# Patient Record
Sex: Male | Born: 1960
Health system: Southern US, Community
[De-identification: ages and names within clinical notes are randomized; demographics above are authoritative.]

## PROBLEM LIST (undated history)

## (undated) DIAGNOSIS — I10 Essential (primary) hypertension: Secondary | ICD-10-CM

## (undated) DIAGNOSIS — H35039 Hypertensive retinopathy, unspecified eye: Secondary | ICD-10-CM

## (undated) DIAGNOSIS — K5720 Diverticulitis of large intestine with perforation and abscess without bleeding: Secondary | ICD-10-CM

## (undated) DIAGNOSIS — R945 Abnormal results of liver function studies: Secondary | ICD-10-CM

## (undated) DIAGNOSIS — W19XXXA Unspecified fall, initial encounter: Secondary | ICD-10-CM

## (undated) DIAGNOSIS — I714 Abdominal aortic aneurysm, without rupture, unspecified: Secondary | ICD-10-CM

## (undated) DIAGNOSIS — K635 Polyp of colon: Secondary | ICD-10-CM

## (undated) DIAGNOSIS — D126 Benign neoplasm of colon, unspecified: Secondary | ICD-10-CM

## (undated) DIAGNOSIS — R001 Bradycardia, unspecified: Secondary | ICD-10-CM

## (undated) DIAGNOSIS — K648 Other hemorrhoids: Secondary | ICD-10-CM

## (undated) DIAGNOSIS — K219 Gastro-esophageal reflux disease without esophagitis: Secondary | ICD-10-CM

## (undated) DIAGNOSIS — I499 Cardiac arrhythmia, unspecified: Secondary | ICD-10-CM

## (undated) DIAGNOSIS — H269 Unspecified cataract: Secondary | ICD-10-CM

## (undated) DIAGNOSIS — K5792 Diverticulitis of intestine, part unspecified, without perforation or abscess without bleeding: Secondary | ICD-10-CM

## (undated) DIAGNOSIS — I4891 Unspecified atrial fibrillation: Secondary | ICD-10-CM

## (undated) DIAGNOSIS — R7989 Other specified abnormal findings of blood chemistry: Secondary | ICD-10-CM

## (undated) DIAGNOSIS — E785 Hyperlipidemia, unspecified: Secondary | ICD-10-CM

## (undated) DIAGNOSIS — E119 Type 2 diabetes mellitus without complications: Secondary | ICD-10-CM

## (undated) DIAGNOSIS — Z8489 Family history of other specified conditions: Secondary | ICD-10-CM

## (undated) DIAGNOSIS — Z973 Presence of spectacles and contact lenses: Secondary | ICD-10-CM

## (undated) DIAGNOSIS — H9319 Tinnitus, unspecified ear: Secondary | ICD-10-CM

## (undated) HISTORY — DX: Hyperlipidemia, unspecified: E78.5

## (undated) HISTORY — PX: TONSILLECTOMY: SUR1361

## (undated) HISTORY — DX: Essential (primary) hypertension: I10

## (undated) HISTORY — DX: Polyp of colon: K63.5

## (undated) HISTORY — DX: Diverticulitis of intestine, part unspecified, without perforation or abscess without bleeding: K57.92

## (undated) HISTORY — DX: Type 2 diabetes mellitus without complications: E11.9

## (undated) HISTORY — DX: Abnormal results of liver function studies: R94.5

## (undated) HISTORY — DX: Benign neoplasm of colon, unspecified: D12.6

## (undated) HISTORY — PX: OTHER SURGICAL HISTORY: SHX169

## (undated) HISTORY — DX: Unspecified atrial fibrillation: I48.91

## (undated) HISTORY — DX: Other hemorrhoids: K64.8

## (undated) HISTORY — DX: Diverticulitis of large intestine with perforation and abscess without bleeding: K57.20

## (undated) HISTORY — PX: SHOULDER SURGERY: SHX246

## (undated) HISTORY — PX: BACK SURGERY: SHX140

## (undated) HISTORY — DX: Abdominal aortic aneurysm, without rupture, unspecified: I71.40

## (undated) HISTORY — DX: Other specified abnormal findings of blood chemistry: R79.89

## (undated) HISTORY — DX: Unspecified cataract: H26.9

## (undated) HISTORY — DX: Hypertensive retinopathy, unspecified eye: H35.039

## (undated) HISTORY — DX: Bradycardia, unspecified: R00.1

---

## 2005-01-29 ENCOUNTER — Emergency Department: Payer: Self-pay | Admitting: Emergency Medicine

## 2005-03-16 ENCOUNTER — Encounter: Payer: Self-pay | Admitting: General Practice

## 2005-03-30 ENCOUNTER — Encounter: Payer: Self-pay | Admitting: General Practice

## 2005-04-30 ENCOUNTER — Encounter: Payer: Self-pay | Admitting: General Practice

## 2005-08-16 ENCOUNTER — Encounter: Admission: RE | Admit: 2005-08-16 | Discharge: 2005-08-16 | Payer: Self-pay | Admitting: Specialist

## 2007-01-02 ENCOUNTER — Ambulatory Visit (HOSPITAL_COMMUNITY): Admission: RE | Admit: 2007-01-02 | Discharge: 2007-01-03 | Payer: Self-pay | Admitting: Orthopedic Surgery

## 2007-01-02 ENCOUNTER — Encounter (INDEPENDENT_AMBULATORY_CARE_PROVIDER_SITE_OTHER): Payer: Self-pay | Admitting: Specialist

## 2007-12-26 ENCOUNTER — Ambulatory Visit (HOSPITAL_COMMUNITY): Admission: RE | Admit: 2007-12-26 | Discharge: 2007-12-27 | Payer: Self-pay | Admitting: Orthopedic Surgery

## 2008-07-30 ENCOUNTER — Ambulatory Visit: Payer: Self-pay | Admitting: Gastroenterology

## 2009-11-20 ENCOUNTER — Inpatient Hospital Stay (HOSPITAL_COMMUNITY): Admission: EM | Admit: 2009-11-20 | Discharge: 2009-11-21 | Payer: Self-pay | Admitting: Emergency Medicine

## 2009-11-20 ENCOUNTER — Ambulatory Visit: Payer: Self-pay | Admitting: Internal Medicine

## 2009-11-21 ENCOUNTER — Encounter: Payer: Self-pay | Admitting: Cardiovascular Disease

## 2009-11-24 ENCOUNTER — Ambulatory Visit: Payer: Self-pay

## 2009-11-24 ENCOUNTER — Ambulatory Visit: Payer: Self-pay | Admitting: Internal Medicine

## 2009-11-24 ENCOUNTER — Encounter (HOSPITAL_COMMUNITY): Admission: RE | Admit: 2009-11-24 | Discharge: 2010-01-28 | Payer: Self-pay | Admitting: Internal Medicine

## 2009-12-03 ENCOUNTER — Ambulatory Visit: Payer: Self-pay | Admitting: Cardiovascular Disease

## 2009-12-03 DIAGNOSIS — I4891 Unspecified atrial fibrillation: Secondary | ICD-10-CM | POA: Insufficient documentation

## 2010-06-23 ENCOUNTER — Encounter: Payer: Self-pay | Admitting: Cardiovascular Disease

## 2010-09-29 NOTE — Assessment & Plan Note (Signed)
Summary: Cardiology Nuclear Study  Nuclear Med Background Indications for Stress Test: Evaluation for Ischemia, Post Hospital  Indications Comments: 11/20/09 CP/AFIB with spontaneous conversion, (-) enzymes  History: GXT  History Comments: Hx. Stress test 90"s: Normal per patient. Hx. AFIB  Symptoms: Chest Pressure, Diaphoresis, Fatigue, Fatigue with Exertion  Symptoms Comments: Last episode of CP:now, 3-4/10; CP comes and goes.   Nuclear Pre-Procedure Cardiac Risk Factors: Family History - CAD, Lipids, Obesity Caffeine/Decaff Intake: None NPO After: 10:00 PM Lungs: Clear IV 0.9% NS with Angio Cath: 22g     IV Site: (R) AC IV Started by: Irean Hong RN Chest Size (in) 46     Height (in): 67 Weight (lb): 225 BMI: 35.37  Nuclear Med Study 1 or 2 day study:  1 day     Stress Test Type:  Stress Reading MD:  Dietrich Pates, MD     Referring MD:  Julien Nordmann, MD Resting Radionuclide:  Technetium 30m Tetrofosmin     Resting Radionuclide Dose:  11 mCi  Stress Radionuclide:  Technetium 13m Tetrofosmin     Stress Radionuclide Dose:  33 mCi   Stress Protocol Exercise Time (min):  13:30 min     Max HR:  153 bpm     Predicted Max HR:  170 bpm  Max Systolic BP: 180 mm Hg     Percent Max HR:  90 %     METS: 16.4 Rate Pressure Product:  16109    Stress Test Technologist:  Rea College CMA-N     Nuclear Technologist:  Domenic Polite CNMT  Rest Procedure  Myocardial perfusion imaging was performed at rest 45 minutes following the intravenous administration of Myoview Technetium 79m Tetrofosmin.  Stress Procedure  The patient exercised for 13:30.  The patient stopped due to fatigue.  He c/o chest pressure, 3-4/10, prior to exercise that did not increase with exercise.  There were no diagnostic ST-T wave changes, only rare PVC and rare couplet.  Myoview was injected at peak exercise and myocardial perfusion imaging was performed after a brief delay.  QPS Raw Data Images:  Soft tissue  (diaphragm) underlies heart. Stress Images:  There is normal uptake in all areas. Rest Images:  Normal homogeneous uptake in all areas of the myocardium. Subtraction (SDS):  No evidence of ischemia. Transient Ischemic Dilatation:  .88  (Normal <1.22)  Lung/Heart Ratio:  .44  (Normal <0.45)  Quantitative Gated Spect Images QGS EDV:  113 ml QGS ESV:  51 ml QGS EF:  55 %   Overall Impression  Exercise Capacity: Excellent exercise capacity. BP Response: Normal blood pressure response. Clinical Symptoms: No chest pain ECG Impression: No significant ST segment change suggestive of ischemia. Overall Impression: Normal stress nuclear study.  Appended Document: Cardiology Nuclear Study pt aware. Charlena Cross RN BSN

## 2010-09-29 NOTE — Progress Notes (Signed)
Summary: PHI  PHI   Imported By: Harlon Flor 12/11/2009 08:24:31  _____________________________________________________________________  External Attachment:    Type:   Image     Comment:   External Document

## 2010-09-29 NOTE — Assessment & Plan Note (Signed)
Summary: NP6/AMD   Visit Type:  New post hospital Referring Provider:  Ladona Ridgel Primary Provider:  Dr Burnett Sheng  CC:  some chest discomfort since out of hospital, feel like a fullness. Fatique is better, and but a little on sunday. no edema in ankles and feet. .  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  He states that on March 24, he woke up fatigued. He went to the gym, lifted weights and worked out on the elliptical. He was unable to stop sweating. He went home and took a shower and continued to sweat. His wife suggested that he go to the emergency room and he went to [facility] where he was noted to be in atrial fibrillation with rates in the 60s to 70s. He converted on his own without significant intervention.  Since then, he has felt well with no further episodes of fatigue palpitations. He reported having some chest tightness in the emergency room on an occasional basis when he worked out and he underwent a stress test.  This study was a exercise pharmacologic study. He exercised for 16 minutes with no significant EKG changes. There is no signs of ischemia on perfusion. Overall this was a low risk scan.  He is currently only taking aspirin and no rhythm controling  medication.  Preventive Screening-Counseling & Management  Alcohol-Tobacco     Alcohol drinks/day: 0     Smoking Status: never  Caffeine-Diet-Exercise     Caffeine use/day: 4     Does Patient Exercise: yes      Drug Use:  no.    Current Problems (verified): 1)  Shortness of Breath  (ICD-786.05)  Current Medications (verified): 1)  Aspirin Ec 325 Mg Tbec (Aspirin) .... Take One Tablet By Mouth Daily 2)  Simvastatin 40 Mg Tabs (Simvastatin) .... Take One Tablet By Mouth Daily At Bedtime 3)  Folic Acid 400 Mcg Tabs (Folic Acid) .... 1 By Mouth Once  Daily 4)  Fish Oil 1000 Mg Caps (Omega-3 Fatty Acids) .... 1 By Mouth Once Daily 5)  Co Q-10 150 Mg Caps (Coenzyme Q10) .... 1 By Mouth Once Daily 6)  Daily Multi  Tabs (Multiple Vitamins-Minerals) .... 1 By Mouth Once Daily  Allergies (verified): 1)  ! Percocet  Past History:  Past Medical History: hx of Atrial Fibrillation Hyperlipidemia Hypertension  Past Surgical History: 2 shoulder surgeries back surgery  Family History: Father: living 74: heart problems, DM, afib Mother: living 73: heart problems. a-fib  Social History: Full Time Married  Tobacco Use - No.  Alcohol Use - no Regular Exercise - yes Drug Use - no Alcohol drinks/day:  0 Smoking Status:  never Caffeine use/day:  4 Does Patient Exercise:  yes Drug Use:  no  Review of Systems  The patient denies fever, weight loss, weight gain, vision loss, decreased hearing, hoarseness, chest pain, syncope, dyspnea on exertion, peripheral edema, prolonged cough, abdominal pain, incontinence, muscle weakness, depression, and enlarged lymph nodes.    Vital Signs:  Patient profile:   50 year old male Height:      67 inches Weight:      228 pounds BMI:     35 .84 Pulse rate:   66 / minute Pulse rhythm:   regular BP sitting:   134 / 84  (left arm)  Vitals Entered By: Mercer Pod (December 03, 2009 2:19  PM)  Physical Exam  General:  well-appearing middle-aged male in no apparent distress, alert and oriented x3,HEENT exam is benign, oropharynx is clear, neck is supple with no JVP or carotid bruits, heart sounds are regular with S1-S2 and no murmurs appreciated, lungs are clear to auscultation with no wheezes or rales, abdominal exam is benign, no significant lower extremity edema, neurologic exam is nonfocal, skin is warm and dry. Normal pulses in her upper and lower extremities.    EKG  Procedure date:  11/21/2009  Findings:      normal sinus rhythm with rate 48 beats per minute, no significant ST or T wave  changes.  Impression & Recommendations:  Problem # 1:  ATRIAL FIBRILLATION (ICD-427.31) Episode of lone atrial fibrillation on March 25 on EKG before converting to normal sinus rhythm without intervention.  Negative stress test in followup. As his heart rate and blood pressure a reasonably well-controlled, we'll not start him on a standing vacation. He was also bradycardic in the emergency room once he converted to normal sinus rhythm with a heart rate of 48.  We will give him some Bystolic 5 mg samples that he can take on a p.r.n. basis if he feels that he is converted back to atrial fibrillation. I've asked him to contact our office if he starts having more episodes as we may have to start him on alternative medications.   If he had additional episodes of age fibrillation, we will also order an echocardiogram. He denies having any sleep disturbance consistent with obstructive sleep apnea.  TSH was 2.6  His updated medication list for this problem includes:    Aspirin Ec 325 Mg Tbec (Aspirin) .[name]... Take one tablet by mouth daily    Bystolic 5 Mg Tabs (Nebivolol hcl) .[name]... 1 tab as needed for palpitations  Patient Instructions: 1)  Your physician recommends that you schedule a follow-up appointment in: 1 year 2)  Your physician has recommended you make the following change in your medication: bystolic 5 mg as needed for palpitations

## 2010-10-07 ENCOUNTER — Ambulatory Visit (INDEPENDENT_AMBULATORY_CARE_PROVIDER_SITE_OTHER): Payer: PRIVATE HEALTH INSURANCE | Admitting: Cardiovascular Disease

## 2010-10-07 ENCOUNTER — Encounter: Payer: Self-pay | Admitting: Cardiovascular Disease

## 2010-10-07 DIAGNOSIS — E785 Hyperlipidemia, unspecified: Secondary | ICD-10-CM

## 2010-10-07 DIAGNOSIS — I4891 Unspecified atrial fibrillation: Secondary | ICD-10-CM

## 2010-10-07 DIAGNOSIS — R001 Bradycardia, unspecified: Secondary | ICD-10-CM | POA: Insufficient documentation

## 2010-10-07 DIAGNOSIS — R0789 Other chest pain: Secondary | ICD-10-CM | POA: Insufficient documentation

## 2010-10-07 DIAGNOSIS — R5383 Other fatigue: Secondary | ICD-10-CM

## 2010-10-07 DIAGNOSIS — R5381 Other malaise: Secondary | ICD-10-CM

## 2010-10-15 ENCOUNTER — Telehealth: Payer: Self-pay | Admitting: Cardiovascular Disease

## 2010-10-15 NOTE — Assessment & Plan Note (Signed)
Summary: Having headaches;cold sweats in the middle of the night/AMD   Visit Type:  Follow-up Referring Provider:  Ladona Ridgel Primary Provider:  Dr Burnett Sheng  CC:  c/o going to the gym one day and worked out pretty hard more than usual; several days after the work out had some chest heaviness with having to take a deep breath. Had a spell this past Sunday of cold sweats and headache with clammy feeling..  History of Present Illness: 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  he reports that he is doing well though has had some symptoms of malaise and fatigue recently. Starting last Friday, he had poor energy after working out on the treadmill. Symptoms lasted from Friday to Sunday. He is uncertain if his rhythm was irregular. He has not been checking his blood pressure. He has had a mild fullness in his chest, rare shortness of breath, sweating in the morning when he wakes up, headache from Sunday until today.  Currently he feels well with no symptoms. He denies any palpitations but was concerned for atrial fibrillation.  he had a normal exercise Myoview last year showing no ischemia, exercised for 16 minutes with no significant EKG changes. Overall this was a low risk scan.  he has taken bystolic on an occasional basis for malaise when he thinks he might be in atrial fibrillation.  EKG shows sinus bradycardia with rate 55 beats per minute, no significant ST or T wave changes  Current Medications (verified): 1)  Aspir-Low 81 Mg Tbec (Aspirin) .... One Tablet Once Daily 2)  Simvastatin 40 Mg Tabs (Simvastatin) .... Take One Tablet By Mouth Daily At Bedtime 3)  Folic Acid 400 Mcg Tabs (Folic Acid) .... 1 By Mouth Once Daily 4)  Fish Oil 1000 Mg Caps (Omega-3 Fatty Acids) .... 1 By Mouth Once Daily 5)  Co Q-10 150 Mg Caps (Coenzyme Q10) .... 1 By Mouth Once Daily 6)  Daily Multi  Tabs  (Multiple Vitamins-Minerals) .... 1 By Mouth Once Daily 7)  Bystolic 5 Mg Tabs (Nebivolol Hcl) .... 1 Tab As Needed For Palpitations  Allergies (verified): 1)  ! Percocet  Past History:  Past Medical History: Last updated: 12/03/2009 hx of Atrial Fibrillation Hyperlipidemia Hypertension  Past Surgical History: Last updated: 12/03/2009 2 shoulder surgeries back surgery  Family History: Last updated: 12/03/2009 Father: living 74: heart problems, DM, afib Mother: living 73: heart problems. a-fib  Social History: Last updated: 12/03/2009 Full Time Married  Tobacco Use - No.  Alcohol Use - no Regular Exercise - yes Drug Use - no  Risk Factors: Alcohol Use: 0 (12/03/2009) Caffeine Use: 4 (12/03/2009) Exercise: yes (12/03/2009)  Risk Factors: Smoking Status: never (12/03/2009)  Review of Systems  The patient denies fever, weight loss, weight gain, vision loss, decreased hearing, hoarseness, syncope, dyspnea on exertion, peripheral edema, prolonged cough, abdominal pain, incontinence, muscle weakness, depression, and enlarged lymph nodes.         rare episode of chest pain, shortness of breath, headaches, malaise, fullness in the chest  Vital Signs:  Patient profile:   50 year old male Height:      67 inches Weight:      231 pounds BMI:     36 .31 Pulse rate:   55 / minute BP sitting:   128 / 90  (left arm) Cuff size:   large  Vitals Entered By: Bishop Dublin, CMA (October 07, 2010 2:58 PM)  Physical Exam  General:  Well developed, well nourished, in no acute distress. Head:  normocephalic and atraumatic Neck:  Neck supple, no JVD. No masses, thyromegaly or abnormal cervical nodes. Lungs:  Clear bilaterally to auscultation and percussion. Heart:  Non-displaced PMI, chest non-tender; regular rate and rhythm, S1, S2 without murmurs, rubs or gallops. Carotid upstroke normal, no bruit. Normal abdominal aortic size, no bruits. Pedals normal pulses. No edema, no  varicosities. Abdomen:  Bowel sounds positive; abdomen soft and non-tender without masses Msk:  Back normal, normal gait. Muscle strength and tone normal. Pulses:  pulses normal in all 4 extremities Extremities:  No clubbing or cyanosis. Neurologic:  Alert and oriented x 3. Skin:  Intact without lesions or rashes. Psych:  Normal affect.   Impression & Recommendations:  Problem # 1:  ATRIAL FIBRILLATION (ICD-427.31) sinus rhythm today. Uncertain if he has had paroxysmal atrial fibrillation recently. We did offer a Holter monitor or an event monitor. He would like to monitor his rhythm himself at home and call us for further malaise.  His updated medication list for this problem includes:    Aspir-low 81 Mg Tbec (Aspirin) ..... One tablet once daily    Bystolic 5 Mg Tabs (Nebivolol hcl) .[name]... 1 tab as needed for palpitations  Orders: EKG w/ Interpretation (93000)  Problem # 2:  OTHER CHEST PAIN (ICD-786.59) Chest pain is likely noncardiac. Have asked him to continue his exercise program and call us if he has worsening symptoms of chest tightness. He did have a negative stress test last year.  His updated medication list for this problem includes:    Aspir-low 81 Mg Tbec (Aspirin) ..... One tablet once daily    Bystolic 5 Mg Tabs (Nebivolol hcl) .[name]... 1 tab as needed for palpitations  Problem # 3:  FATIGUE / MALAISE (ICD-780.79) Etiology of his malaise and fatigue is uncertain. This could be secondary to stress, working hard, cannot exclude arrhythmia. He will contact us if his symptoms persist for a Holter monitor.

## 2010-10-21 NOTE — Progress Notes (Signed)
Summary: A Fib  Phone Note Call from Patient   Caller: Patient Call For: RN Summary of Call: Pt called and wanted to inform us that he just left the gym after workout and he feels he may be in a fib, HR may be faster than normal (even though he just worked out) and feels irregular. Pt is not concerned, and denies any contributing symptoms. Pt did take his Bystolic. Advised pt to call me tomorrow to let me know if this has continued. I know you had mentioned a possible Holter monitor. If pt still feels he is in afib do you recommend I put monitor on pt? Please advise. Initial call taken by: Lanny Hurst RN,  October 15, 2010 4:53 PM  Follow-up for Phone Call        Would leave it up to patient; He can come in for EKG to check rhythm or if he thinks he is in and out, we can have him wear a holter. If he checks BP machine at home, this might tell him what rhythm he is in (if it is regular)     Appended Document: A Fib Spoke to pt, notified of recommendations above. Pt states he feels he is in NSR after continuing Bystolic. Pt agrees to monitor HR through his BP machine and it does have a light that indicates if HR is irregular. Pt will call office with any changes in his rhythm.

## 2010-10-27 ENCOUNTER — Ambulatory Visit (INDEPENDENT_AMBULATORY_CARE_PROVIDER_SITE_OTHER): Payer: PRIVATE HEALTH INSURANCE | Admitting: Internal Medicine

## 2010-10-27 ENCOUNTER — Encounter (INDEPENDENT_AMBULATORY_CARE_PROVIDER_SITE_OTHER): Payer: Self-pay | Admitting: *Deleted

## 2010-10-27 ENCOUNTER — Encounter: Payer: Self-pay | Admitting: Internal Medicine

## 2010-10-27 DIAGNOSIS — I4891 Unspecified atrial fibrillation: Secondary | ICD-10-CM

## 2010-10-27 DIAGNOSIS — E782 Mixed hyperlipidemia: Secondary | ICD-10-CM

## 2010-10-29 ENCOUNTER — Encounter (INDEPENDENT_AMBULATORY_CARE_PROVIDER_SITE_OTHER): Payer: PRIVATE HEALTH INSURANCE

## 2010-10-29 ENCOUNTER — Encounter: Payer: Self-pay | Admitting: Cardiovascular Disease

## 2010-10-29 DIAGNOSIS — R0602 Shortness of breath: Secondary | ICD-10-CM

## 2010-10-29 DIAGNOSIS — I4891 Unspecified atrial fibrillation: Secondary | ICD-10-CM

## 2010-11-02 ENCOUNTER — Encounter: Payer: Self-pay | Admitting: Cardiovascular Disease

## 2010-11-05 NOTE — Assessment & Plan Note (Signed)
Summary: poss afib/sab  Nurse Visit   Vital Signs:  Patient profile:   50 year old male Height:      67 inches Weight:      229 pounds BMI:     36.00 Pulse rate:   58 / minute BP sitting:   130 / 90  (left arm) Cuff size:   large  Vitals Entered By: Bishop Dublin, CMA (October 27, 2010 1:59 PM)  Past History:  Past Medical History: Last updated: 12/03/2009 hx of Atrial Fibrillation Hyperlipidemia Hypertension  Past Surgical History: Last updated: 12/03/2009 2 shoulder surgeries back surgery  Family History: Last updated: 12/03/2009 Father: living 11: heart problems, DM, afib Mother: living 71: heart problems. a-fib  Social History: Last updated: 12/03/2009 Full Time Married  Tobacco Use - No.  Alcohol Use - no Regular Exercise - yes Drug Use - no  Risk Factors: Alcohol Use: 0 (12/03/2009) Caffeine Use: 4 (12/03/2009) Exercise: yes (12/03/2009)  Risk Factors: Smoking Status: never (12/03/2009)   Current Medications (verified): 1)  Aspir-Low 81 Mg Tbec (Aspirin) .... One Tablet Once Daily 2)  Simvastatin 40 Mg Tabs (Simvastatin) .... Take One Tablet By Mouth Daily At Bedtime 3)  Folic Acid 400 Mcg Tabs (Folic Acid) .[name].. 1 By Mouth Once Daily 4)  Fish Oil 1000 Mg Caps (Omega-3 Fatty Acids) .[name].. 1 By Mouth Once Daily 5)  Co Q-10 150 Mg Caps (Coenzyme Q10) .[name].. 1 By Mouth Once Daily 6)  Daily Multi  Tabs (Multiple Vitamins-Minerals) .[name].. 1 By Mouth Once Daily 7)  Bystolic 5 Mg Tabs (Nebivolol Hcl) .[name].. 1 Tab As Needed For Palpitations  Allergies (verified): 1)  ! Percocet  Visit Type:  Nurse visit  CC:  c/o fluttering in chest with some chest tightness  and shortness of breath.[name]

## 2010-11-05 NOTE — Assessment & Plan Note (Signed)
Summary: a. flutter/sab   Visit Type:  Follow-up Referring Provider:  Ladona Ridgel Primary Provider:  Dr Burnett Sheng  CC:  Pt thinks he is in atrial fibrillation.  History of Present Illness: Jorge Peterson presents today for evaluation and treatment of atrial fibrillation and dyslipidemia.  He has known atrial fibrillation and appears to have increased frequency and severity over the past few weeks.  The patient has minimal palpitations but notes increased weakness, sob and fatigue.  He denies c/p. No syncope.  Current Medications (verified): 1)  Aspir-Low 81 Mg Tbec (Aspirin) .... One Tablet Once Daily 2)  Simvastatin 40 Mg Tabs (Simvastatin) .... Take One Tablet By Mouth Daily At Bedtime 3)  Folic Acid 400 Mcg Tabs (Folic Acid) .[name].. 1 By Mouth Once Daily 4)  Fish Oil 1000 Mg Caps (Omega-3 Fatty Acids) .[name].. 1 By Mouth Once Daily 5)  Co Q-10 150 Mg Caps (Coenzyme Q10) .[name].. 1 By Mouth Once Daily 6)  Daily Multi  Tabs (Multiple Vitamins-Minerals) .[name].. 1 By Mouth Once Daily 7)  Bystolic 5 Mg Tabs (Nebivolol Hcl) .[name].. 1 Tab As Needed For Palpitations  Allergies (verified): 1)  ! Percocet  Past History:  Past Medical History: Last updated: 12/03/2009 hx of Atrial Fibrillation Hyperlipidemia Hypertension  Past Surgical History: Last updated: 12/03/2009 2 shoulder surgeries back surgery  Family History: Last updated: 12/03/2009 Father: living 15: heart problems, DM, afib Mother: living 67: heart problems. a-fib  Social History: Last updated: 12/03/2009 Full Time Married  Tobacco Use - No.  Alcohol Use - no Regular Exercise - yes Drug Use - no  Risk Factors: Alcohol Use: 0 (12/03/2009) Caffeine Use: 4 (12/03/2009) Exercise: yes (12/03/2009)  Risk Factors: Smoking Status: never (12/03/2009)  Review of Systems       All systems reviewed and negative except as noted in the HPI.  Vital Signs:  Patient profile:   50 year old male Height:      67 inches Weight:      229  pounds BMI:     36.00 Pulse rate:   58 / minute Pulse rhythm:   irregular BP sitting:   130 / 90  (left arm) Cuff size:   regular  Vitals Entered By: Jorge Peterson, CMA (October 27, 2010 2:53 PM)  Physical Exam  General:  Well developed, well nourished, in no acute distress. Head:  normocephalic and atraumatic Neck:  Neck supple, no JVD. No masses, thyromegaly or abnormal cervical nodes. Lungs:  Clear bilaterally to auscultation with no wheezes, rales, or rhonchi. Heart:  IRIR with normal S1 and S2. PMI is not enlarged. Abdomen:  Bowel sounds positive; abdomen soft and non-tender without masses Msk:  Back normal, normal gait. Muscle strength and tone normal. Pulses:  pulses normal in all 4 extremities Extremities:  No clubbing or cyanosis. Neurologic:  Alert and oriented x 3.   EKG  Procedure date:  10/27/2010  Findings:      Atrial fibrillation with a controlled ventricular response rate of: 58.  Impression & Recommendations:  Problem # 1:  ATRIAL FIBRILLATION (ICD-427.31) The patient has documented symptomatic atrial fib. I have discussed the treatment options. He is CHADS 0. I have recommended we start flecainide, continue ASA and low dose beta blockers and will arrange an exercise test. He has previous normal LV function on stress testing. His updated medication list for this problem includes:    Aspir-low 81 Mg Tbec (Aspirin) ..... One tablet once daily    Bystolic 5 Mg Tabs (Nebivolol hcl) .Marland KitchenMarland KitchenMarland KitchenMarland Kitchen  1 tab as needed for palpitations    Flecainide Acetate 100 Mg Tabs (Flecainide acetate) .[name]... Take one tablet by mouth two times a day.  Orders: Treadmill (Treadmill)  Problem # 2:  SHORTNESS OF BREATH (ICD-786.05) His dyspnea appears to be due to his atrial fib. I have also recommended a low sodium diet. His updated medication list for this problem includes:    Aspir-low 81 Mg Tbec (Aspirin) ..... One tablet once daily    Bystolic 5 Mg Tabs (Nebivolol hcl) .[name]... 1 tab as  needed for palpitations  Orders: Treadmill (Treadmill)  Patient Instructions: 1)  Your physician recommends that you schedule a follow-up appointment in: 6-8 WEEKS with Dr. Ladona Ridgel 2)  Your physician has recommended you make the following change in your medication: START Flecainide 100mg  two times a day. 3)  Your physician has requested that you have an exercise tolerance test.  For further information please visit [email or web address]  Please also follow instruction sheet, as given. WEAR COMFORTABLE CLOTHING AND TENNIS SHOES FOR WALKING. DO NOT TAKE BYSTOLIC THE DAY OF YOUR PROCEDURE. Prescriptions: FLECAINIDE ACETATE 100 MG TABS (FLECAINIDE ACETATE) Take one tablet by mouth two times a day.  #60 x 6   Entered by:   Lanny Hurst RN   Authorized by:   Laren Boom, MD, [facility]   Signed by:   Lanny Hurst RN on 10/27/2010   Method used:   Electronically to        [facility] [address]* (retail)       [address]       [place], [place]  [address]       Ph: [phone]       Fax: [phone]   RxID:   [id number]

## 2010-11-05 NOTE — Assessment & Plan Note (Signed)
Summary: Treadmill Visit   Vital Signs:  Patient profile:   50 year old male Height:      67 inches Weight:      229 pounds BMI:     36.00 Pulse rate:   57 / minute BP sitting:   112 / 68  (right arm) Cuff size:   large  Vitals Entered By: Lysbeth Galas CMA (October 29, 2010 9:42 AM)  Referring Provider:  Ladona Ridgel Primary Provider:  Dr Burnett Sheng   History of Present Illness: Jorge Peterson presents for routine treadmill in the office today.  Resting EKG shows normal sinus rhythm with rate of 57 beats per minute with no significant ST or T wave changes. Resting blood pressure 112/68.  He exercised on a regular Bruce protocol for a total of 10 minutes 30 seconds, achieved 13 METS. Peak heart rate 169 beats per minute, peak blood pressure 148/88.  No significant ST abnormality concerning for ischemia. No other underlying arrhythmia noted. No significant symptoms at peak stress. He did report some mild shortness of breath though could have exercised for longer. QTc within normal limits  Impression: Normal exercise treadmill study with no significant ischemia or arrhythmia noted  Allergies: 1)  ! Percocet

## 2010-11-23 LAB — POCT I-STAT, CHEM 8
Glucose, Bld: 102 mg/dL — ABNORMAL HIGH (ref 70–99)
HCT: 45 % (ref 39.0–52.0)
Hemoglobin: 15.3 g/dL (ref 13.0–17.0)
Potassium: 4.1 mEq/L (ref 3.5–5.1)
Sodium: 139 mEq/L (ref 135–145)

## 2010-11-23 LAB — DIFFERENTIAL
Basophils Absolute: 0 10*3/uL (ref 0.0–0.1)
Basophils Relative: 0 % (ref 0–1)
Neutro Abs: 5 10*3/uL (ref 1.7–7.7)
Neutrophils Relative %: 57 % (ref 43–77)

## 2010-11-23 LAB — LIPID PANEL
Cholesterol: 155 mg/dL (ref 0–200)
HDL: 33 mg/dL — ABNORMAL LOW (ref >39)
LDL Cholesterol: 77 mg/dL (ref 0–99)
Triglycerides: 225 mg/dL — ABNORMAL HIGH (ref <150)

## 2010-11-23 LAB — CBC
MCHC: 33.4 g/dL (ref 30.0–36.0)
MCHC: 33.9 g/dL (ref 30.0–36.0)
MCV: 84.9 fL (ref 78.0–100.0)
Platelets: 158 10*3/uL (ref 150–400)
RDW: 13.3 % (ref 11.5–15.5)
RDW: 13.5 % (ref 11.5–15.5)
WBC: 7.7 10*3/uL (ref 4.0–10.5)

## 2010-11-23 LAB — PROTIME-INR: Prothrombin Time: 12.5 seconds (ref 11.6–15.2)

## 2010-11-23 LAB — CK TOTAL AND CKMB (NOT AT ARMC): Relative Index: 0.8 (ref 0.0–2.5)

## 2010-11-23 LAB — MAGNESIUM: Magnesium: 2.2 mg/dL (ref 1.5–2.5)

## 2010-11-23 LAB — CARDIAC PANEL(CRET KIN+CKTOT+MB+TROPI)
Troponin I: 0.01 ng/mL (ref 0.00–0.06)
Troponin I: 0.02 ng/mL (ref 0.00–0.06)

## 2010-11-23 LAB — HEPARIN LEVEL (UNFRACTIONATED): Heparin Unfractionated: 0.22 IU/mL — ABNORMAL LOW (ref 0.30–0.70)

## 2010-11-23 LAB — POCT CARDIAC MARKERS
CKMB, poc: 2 ng/mL (ref 1.0–8.0)
Myoglobin, poc: 72.2 ng/mL (ref 12–200)

## 2010-12-18 ENCOUNTER — Ambulatory Visit: Payer: PRIVATE HEALTH INSURANCE | Admitting: Internal Medicine

## 2010-12-18 ENCOUNTER — Telehealth: Payer: Self-pay | Admitting: Internal Medicine

## 2010-12-18 NOTE — Telephone Encounter (Signed)
LMOM to call back and schedule the missed appt with Ladona Ridgel.

## 2011-01-12 NOTE — Op Note (Signed)
NAME:  Baumgarten, Adger              ACCOUNT NO.:  [id number]   MEDICAL RECORD NO.:  [id number]          PATIENT TYPE:  OIB   LOCATION:  1522                         FACILITY:  [facility]   PHYSICIAN:  Marlowe Kays, M.D.  DATE OF BIRTH:  1960-09-05   DATE OF PROCEDURE:  12/26/2007  DATE OF DISCHARGE:                               OPERATIVE REPORT   PREOPERATIVE DIAGNOSIS:  Chronic impingement syndrome with rotator cuff  tendinopathy, left shoulder.   POSTOPERATIVE DIAGNOSIS:  Chronic impingement syndrome with rotator cuff  tendinopathy, left shoulder.   OPERATION:  1. Left shoulder arthroscopy with debridement of degenerated labrum      and articular surface of rotator cuff.  2. Arthroscopic subacromial decompression with debridement of small      rotator cuff tear.  3. Arthroscopic decompression of distal inferior clavicle.   SURGEON:  Marlowe Kays, M.D.   ASSISTANTDruscilla Brownie. Underwood, P.A.-C.   ANESTHESIA:  General.   INDICATIONS FOR PROCEDURE:  He has history of open rotator cuff repair  on the right shoulder.  Because of left shoulder pain, we obtained an  MRI which demonstrated a reasonably good-looking AC joint except for  inferior spurs which along with a curved acromion were digging into the  rotator cuff.  He had an apparent small full-thickness rotator cuff tear  near the insertion of the supraspinatus tendon.  There was no apparent  retraction on the MRI.  Because of this, I approached the case with the  possibility that we might have to do some open repair work of the  rotator cuff depending on what we found at surgery.   PROCEDURE:  Prophylactic antibiotics.  Satisfactory general anesthesia.  Beach-chair position on the [place] frame.  Left shoulder girdle was  prepped with DuraPrep and draped into a sterile field.  Time-out  performed.  Anatomy of the shoulder joint was marked out.  Posterior and  lateral portal sites and subacromial space were all infiltrated  with  0.5% Marcaine with adrenaline, mainly for hemostasis purposes.  He had  had a preoperative interscalene block.  Through a posterior soft spot  portal, I atraumatically entered the glenohumeral joint.  He had some  labral degeneration and several areas of flap interposition into the  joint which appeared to be creating an impingement problem.  There was  also a good bit of fraying of the undersurface of the rotator cuff.  Accordingly, I advanced the scope between the biceps and subscapularis  and using a switching stick I made an anterior incision.  Over the  switching stick I placed a metal cannula which I advanced into the joint  and then used a 4.2 shaver to debride down the labrum and the  undersurface of the rotator cuff with final pictures being taken.  I  then redirected the scope in the subacromial space.  He had a flagrant  bursitis.  Through the lateral portal I introduced the 4.2 shaver,  removing a good bit of the bursal tissue.  I then used a Patent attorney and corrected some of the bleeding from the  bursectomy.  Then I used it to begin removing soft tissue from the  undersurface of the anterior acromion and back to the [facility] joint.  Large  spurs at the [facility] joint were identified and demarcated.  I then used a 4-  mm oval bur to begin removing bone from the undersurface of the acromion  and distal clavicle.  I kept removing bone until we had wide  decompression documented by pictures.  We then also searched the rotator  cuff and found a 5-mm tear with about 5 mm of penetration of the shaver  into it.  I debrided this up and based on the small size and removal of  the impinging process, I was hopeful that this will be all the treatment  he would need for it.  Also, he is a very muscular man and even a mini  open incision would require a good bit of exposure, reinforcing my  feeling of trying this arthroscopically first.   The subacromial space was cleared of  all fluid possible.  There was no  bleeding at the conclusion of the case.  The three portals were closed  with interrupted 4-0 nylon mattress sutures.  Betadine, Adaptic,  dressing sterile dressing and shoulder mobilizer were applied.  He  tolerated the procedure well and was taken to the recovery room in  satisfactory condition with no known complications.           ______________________________  Marlowe Kays, M.D.     JA/MEDQ  D:  12/26/2007  T:  12/26/2007  Job:  [id number]

## 2011-01-15 NOTE — Op Note (Signed)
NAME:  Jorge Peterson, Jorge Peterson              ACCOUNT NO.:  [id number]   MEDICAL RECORD NO.:  [id number]          PATIENT TYPE:  AMB   LOCATION:  DAY                          FACILITY:  [facility]   PHYSICIAN:  Marlowe Kays, M.D.  DATE OF BIRTH:  February 25, 1961   DATE OF PROCEDURE:  01/02/2007  DATE OF DISCHARGE:                               OPERATIVE REPORT   PREOPERATIVE DIAGNOSIS:  Herniated nucleus pulposus L5-S1, right.   POSTOPERATIVE DIAGNOSIS:  Herniated nucleus pulposus L5-S1, right.   OPERATION:  Microdiskectomy L5-S1 right.   ASSISTANT:  Dr. Shon Baton.   ANESTHESIA:  General.   PATHOLOGY AND JUSTIFICATION FOR PROCEDURE:  Long history of back and  right leg pain, worse recently with a recent MRI confirming a disk  herniation L5-S1 on the right with contact of the S1 nerve root.  He is  having severe pain without neurologic deficit and accordingly is here  today for the above mentioned surgery.   PROCEDURE:  Prophylactic antibiotics, satisfactory general anesthesia,  knee-chest position on the [place] frame.  Back was prepped and draped  in the sterile field.  I used 2 spinal needles and a lateral x-ray and  tentatively localized the L5-S1 inner space.  Then, continued draping  the back and sterile field with Ioban employed.  Vertical midline  incision based on the initial x-rays.  The two spinous processes, which  I thought were L5-S1 were tagged with Kocher clamps and second lateral x-  ray confirmed these were indeed were on L5 and S1 with the L5-S1 inner  space midway between.  Based on this, I continued dissection of soft  tissue off the lamina of L5 and the sacrum, placed a self-retaining  McCullough retractor.  With a small curette I undermined the inferior  portion of the L5 and superior portion of the sacrum and used first a 2  and then 3 mm Kerrison rongeurs removing bone and  ligament of flavum.  When the dissection became a little more difficult, we brought in a  microscope  to complete the decompression.  The S1 nerve root was  identified and the foramen freed up completely of osteophytes.  I was  able to mobilize the S1 nerve root with a little difficulty finding a  large disk herniation beneath it at the expected location.  I opened the  disk with a 15 knife blade, removed a large amount of disk material  including a number of good size chunks with a combination of nerve hook,  Epstein curette, straight and off-bit pituitary rongeurs.  When all disk  material had been removed, we checked the nerve root and it was free in  the foramen and there was nothing impinging beneath it.  The wound was  irrigated well with sterile saline.  We placed FloSeal for hemostasis at  the bed of the wound followed by Gelfoam and thrombin.  Self-retaining  retractors were removed.  There was no unusual bleeding.  He was given  30 mg of Toradol IV.  The fascia was closed with interrupted number 1  Vicryl, the deep subcutaneous tissue with a combination of  number 1 and  2-0 Vicryl, staples in the skin, Betadine, and dry sterile dressing were  applied.  He was gently placed on his bed and taken to the recovery room  in satisfactory condition with no complications.          ______________________________  Marlowe Kays, M.D.    JA/MEDQ  D:  01/02/2007  T:  01/03/2007  Job:  [id number]

## 2011-01-19 ENCOUNTER — Telehealth: Payer: Self-pay | Admitting: *Deleted

## 2011-01-19 ENCOUNTER — Telehealth: Payer: Self-pay | Admitting: Emergency Medicine

## 2011-01-19 ENCOUNTER — Encounter: Payer: Self-pay | Admitting: *Deleted

## 2011-01-19 ENCOUNTER — Ambulatory Visit (INDEPENDENT_AMBULATORY_CARE_PROVIDER_SITE_OTHER): Payer: PRIVATE HEALTH INSURANCE | Admitting: *Deleted

## 2011-01-19 VITALS — BP 120/82 | HR 47 | Ht 67.0 in | Wt 229.0 lb

## 2011-01-19 DIAGNOSIS — I4891 Unspecified atrial fibrillation: Secondary | ICD-10-CM

## 2011-01-19 NOTE — Telephone Encounter (Signed)
At last office visit it was discussed if taking otc mag did not help with her symptomatic PVCs that she could try biofeedback. Pt is asking where she would do this, or any recommendations of a place to do this? Thanks.

## 2011-01-19 NOTE — Telephone Encounter (Signed)
Pt's wife called stating that Jorge Peterson has been going in and out of A.Fib. All night. He has been having cold sweats, chest heaviness and pulse is 39. She wanted to see if he can come in to get checked out. Notified pt's wife that we don't have a opening for Dr.Gollan this morning but we can do a nurse visit. Verbalized understanding and they are on their way to our office.

## 2011-01-19 NOTE — Progress Notes (Signed)
Pt in today for EKG for symptoms of cold sweat night before and a HR this AM (reported) of 39. EKG this am shows atrial flutter with slow HR of 47. Pt is asymptomatic at visit, does report after coming up stairs he was SOB. Pt does take Flecainide 100mg  bid. Pt has f/u with Dr. Ladona Ridgel tomorrow AM. Discussed with Dr. Mariah Milling. Per Dr. Mariah Milling, advised that pt hold his prn bystolic and given his HR today to either hold his Am Flecainide, or cut in half and take twice daily until follow up.  Pt's wife with pt today and worried that pt could have underlying problem that could lead to MI. Pt did have negative treadmill stress test, but did advise she discuss this at her appt tomorrow with Dr. Ladona Ridgel. Pt will call back today with any changes in how he feels.

## 2011-01-20 ENCOUNTER — Ambulatory Visit (INDEPENDENT_AMBULATORY_CARE_PROVIDER_SITE_OTHER): Payer: PRIVATE HEALTH INSURANCE | Admitting: Internal Medicine

## 2011-01-20 ENCOUNTER — Encounter: Payer: Self-pay | Admitting: Internal Medicine

## 2011-01-20 VITALS — BP 126/78 | HR 52 | Ht 67.0 in | Wt 232.0 lb

## 2011-01-20 DIAGNOSIS — I4891 Unspecified atrial fibrillation: Secondary | ICD-10-CM

## 2011-01-20 DIAGNOSIS — R5381 Other malaise: Secondary | ICD-10-CM

## 2011-01-20 DIAGNOSIS — R5383 Other fatigue: Secondary | ICD-10-CM

## 2011-01-20 MED ORDER — NEBIVOLOL HCL 5 MG PO TABS: 5.0000 mg | ORAL_TABLET | Freq: Every day | ORAL | Status: DC | PRN

## 2011-01-20 NOTE — Patient Instructions (Signed)
You have been referred to Dr Hillis Range for afib ablation

## 2011-01-21 ENCOUNTER — Encounter: Payer: Self-pay | Admitting: Internal Medicine

## 2011-01-21 NOTE — Progress Notes (Signed)
HPI Jorge Peterson returns today for followup. He is a very pleasant 50 year old man with symptomatic bradycardia, hypertension, and paroxysmal atrial fibrillation. When I met the patient several weeks ago, he was having increasingly frequent episodes of atrial fibrillation. These are associated with palpitations, shortness of breath, and dizziness. At that time I thought it most appropriate to try him on flecainide 100 mg twice daily. In the interim, he notes that his palpitations have not improved. They appear to be more common at nighttime. He continues to try to exercise. During exercise he will sometimes feel his heart go out of rhythm. The spells also occur with at rest. He denies caffeine or alcohol intake. The patient has had no frank syncope. He also thinks that since starting flecainide, he has had increased fatigue. Allergies  Allergen Reactions  . Oxycodone-Acetaminophen      Current Outpatient Prescriptions  Medication Sig Dispense Refill  . aspirin 81 MG EC tablet Take 81 mg by mouth daily.        . Coenzyme Q10 (COQ10) 150 MG CAPS Take 1 capsule by mouth daily.        . fish oil-omega-3 fatty acids 1000 MG capsule Take 1 g by mouth daily.        . flecainide (TAMBOCOR) 100 MG tablet Take 100 mg by mouth 2 (two) times daily.        . folic acid (FOLVITE) 400 MCG tablet Take 400 mcg by mouth daily.        . Multiple Vitamin (MULTIVITAMIN) tablet Take 1 tablet by mouth daily.        . nebivolol (BYSTOLIC) 5 MG tablet Take 1 tablet (5 mg total) by mouth daily as needed.  30 tablet  11  . simvastatin (ZOCOR) 40 MG tablet Take 40 mg by mouth at bedtime.           Past Medical History  Diagnosis Date  . Atrial fibrillation   . Hyperlipidemia   . Hypertension     ROS:   All systems reviewed and negative except as noted in the HPI.   Past Surgical History  Procedure Date  . Shoulder surgery     x 2  . Back surgery      Family History  Problem Relation Age of Onset  .  Atrial fibrillation Mother   . Atrial fibrillation Father   . Diabetes Father      History   Social History  . Marital Status: Divorced    Spouse Name: N/A    Number of Children: N/A  . Years of Education: N/A   Occupational History  . Not on file.   Social History Main Topics  . Smoking status: Never Smoker   . Smokeless tobacco: Never Used  . Alcohol Use: No  . Drug Use: No  . Sexually Active:    Other Topics Concern  . Not on file   Social History Narrative  . No narrative on file     BP 126/78  Pulse 52  Ht 5\' 7"  (1.702 m)  Wt 232 lb (105.235 kg)  BMI 36.34 kg/m2  Physical Exam:  Obese but muscular,well appearing NAD HEENT: Unremarkable Neck:  No JVD, no thyromegally Lymphatics:  No adenopathy Back:  No CVA tenderness Lungs:  Clear HEART:  Regular rate rhythm, no murmurs, no rubs, no clicks Abd:  obese, positive bowel sounds, no organomegally, no rebound, no guarding Ext:  2 plus pulses, no edema, no cyanosis, no clubbing Skin:  No rashes  no nodules Neuro:  CN II through XII intact, motor grossly intact  Assess/Plan:

## 2011-01-21 NOTE — Assessment & Plan Note (Signed)
Unfortunately the patient continues to have symptomatic atrial fibrillation. Flecainide has not improved his symptoms and may have worsened them. His atrial fibrillation is made more difficult to treat because he also has fairly marked sinus bradycardia. I discussed the treatment options with the patient including inpatient hospitalization for the initiation of Tikosyn. Also discussed with referral for catheter ablation of his atrial fibrillation. Based on his young age, and active lifestyle, I have recommended that he strongly consider catheter ablation.

## 2011-01-21 NOTE — Assessment & Plan Note (Signed)
In part I suspect this is a consequence of his atrial flutter and bradycardia and medications for the above problems. Hopefully we can restore him to sinus rhythm and allow him to come off some of these medications

## 2011-01-21 NOTE — Telephone Encounter (Signed)
Integrative therapies Thanks

## 2011-02-17 ENCOUNTER — Ambulatory Visit: Payer: Self-pay | Admitting: Gastroenterology

## 2011-03-04 ENCOUNTER — Encounter: Payer: Self-pay | Admitting: Internal Medicine

## 2011-03-04 ENCOUNTER — Ambulatory Visit (INDEPENDENT_AMBULATORY_CARE_PROVIDER_SITE_OTHER): Payer: PRIVATE HEALTH INSURANCE | Admitting: Internal Medicine

## 2011-03-04 VITALS — BP 130/83 | HR 58 | Resp 16 | Ht 67.0 in | Wt 230.0 lb

## 2011-03-04 DIAGNOSIS — I4891 Unspecified atrial fibrillation: Secondary | ICD-10-CM

## 2011-03-04 DIAGNOSIS — R0609 Other forms of dyspnea: Secondary | ICD-10-CM

## 2011-03-04 DIAGNOSIS — R0683 Snoring: Secondary | ICD-10-CM

## 2011-03-04 DIAGNOSIS — R0989 Other specified symptoms and signs involving the circulatory and respiratory systems: Secondary | ICD-10-CM

## 2011-03-04 NOTE — Assessment & Plan Note (Signed)
Pts spouse states that he frequently snores. We will obtain a sleep study to evaluate for sleep apnea.

## 2011-03-04 NOTE — Assessment & Plan Note (Addendum)
Jorge Peterson has symptomatic paroxysmal atrial fibrillation.  He has failed medical therapy with flecainide and bystolic. Therapeutic strategies for afib including medicine and ablation were discussed in detail with the patient today. Risk, benefits, and alternatives to EP study and radiofrequency ablation for afib were also discussed in detail today. These risks include but are not limited to stroke, bleeding, vascular damage, tamponade, perforation, damage to the esophagus, lungs, and other structures, pulmonary vein stenosis, worsening renal function, and death. The patient understands these risk and wishes to proceed.  We will therefore proceed with catheter ablation once the patient has been adequately anticoagulated.  He will start pradaxa 150mg  BID today.  We will obtain a sleep study.  In addition, I have ordered an echo to evaluate for structural heart disease.

## 2011-03-04 NOTE — Patient Instructions (Addendum)
Your physician has requested that you have an echocardiogram. Echocardiography is a painless test that uses sound waves to create images of your heart. It provides your doctor with information about the size and shape of your heart and how well your heart's chambers and valves are working. This procedure takes approximately one hour. There are no restrictions for this procedure.  Your physician has recommended that you have a sleep study. This test records several body functions during sleep, including: brain activity, eye movement, oxygen and carbon dioxide blood levels, heart rate and rhythm, breathing rate and rhythm, the flow of air through your mouth and nose, snoring, body muscle movements, and chest and belly movement.  Your physician has recommended that you have an ablation. Catheter ablation is a medical procedure used to treat some cardiac arrhythmias (irregular heartbeats). During catheter ablation, a long, thin, flexible tube is put into a blood vessel in your groin (upper thigh), or neck. This tube is called an ablation catheter. It is then guided to your heart through the blood vessel. Radio frequency waves destroy small areas of heart tissue where abnormal heartbeats may cause an arrhythmia to start. Please see the instruction sheet given to you today.  Your physician has recommended you make the following change in your medication:  1) Start Pradaxa 150mg  one capsule by mouth twice daily.  Your physician has requested that you have a TEE. During a TEE, sound waves are used to create images of your heart. It provides your doctor with information about the size and shape of your heart and how well your heart's chambers and valves are working. In this test, a transducer is attached to the end of a flexible tube that's guided down your throat and into your esophagus (the tube leading from you mouth to your stomach) to get a more detailed image of your heart. You are not awake for the procedure.  Please see the instruction sheet given to you today. For further information please visit [email or web address]

## 2011-03-04 NOTE — Progress Notes (Signed)
Jorge Peterson is a pleasant 50 y.o. WM patient with a h/o paroxysmal atrial fibrillation who presents today for consultation regarding afib ablation.  He reports having his first episode of atrial fibrillation 2/11 after presenting with symptoms of fatigue and palpitations.  He reports increasing episodes of afib since that time.  He reports fatigue and decreased exercise tolerance with episodes.  He states that he frequently feels "washed out".  Presently, he feels that he has episodes of afib most days.  Episodes typically last several hours.  He finds that episodes occur frequently at rest and are not always related to exertion.  He is unaware of any triggers/ precipitants. He has tried flecainide and bystolic.  He did not have significant improvement with flecainide.   Today, he denies symptoms of chest pain, shortness of breath, orthopnea, PND, lower extremity edema, dizziness, presyncope, syncope, or neurologic sequela. The patient is tolerating medications without difficulties and is otherwise without complaint today.   Past Medical History  Diagnosis Date  . Atrial fibrillation   . Hyperlipidemia   . Hypertension    Past Surgical History  Procedure Date  . Shoulder surgery     x 2  . Back surgery     Current Outpatient Prescriptions  Medication Sig Dispense Refill  . aspirin 81 MG EC tablet Take 81 mg by mouth daily.        [name] BIOTIN PO Take by mouth daily.        . Coenzyme Q10 (COQ10) 150 MG CAPS Take 1 capsule by mouth daily.        . fish oil-omega-3 fatty acids 1000 MG capsule Take 1 g by mouth daily.        . folic acid (FOLVITE) 400 MCG tablet Take 400 mcg by mouth daily.        . Multiple Vitamin (MULTIVITAMIN) tablet Take 1 tablet by mouth daily.        . nebivolol (BYSTOLIC) 5 MG tablet Take 1 tablet (5 mg total) by mouth daily as needed.  30 tablet  11  . simvastatin (ZOCOR) 40 MG tablet Take 40 mg by mouth at bedtime.        . dabigatran (PRADAXA) 150 MG CAPS Take 1  capsule (150 mg total) by mouth every 12 (twelve) hours.      [name] DISCONTD: flecainide (TAMBOCOR) 100 MG tablet Take 100 mg by mouth 2 (two) times daily.          Allergies  Allergen Reactions  . Oxycodone-Acetaminophen     History   Social History  . Marital Status: Married    Spouse Name: N/A    Number of Children: N/A  . Years of Education: N/A   Occupational History  . Not on file.   Social History Main Topics  . Smoking status: Never Smoker   . Smokeless tobacco: Never Used  . Alcohol Use: No  . Drug Use: No  . Sexually Active:    Other Topics Concern  . Not on file   Social History Narrative   Lives in [place].  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    Family History  Problem Relation Age of Onset  . Atrial fibrillation Mother   . Atrial fibrillation Father   . Diabetes Father     ROS- All systems are reviewed and negative except as per the HPI above  Physical Exam: Filed Vitals:   03/04/11 1118  BP: 130/83  Pulse: 58  Resp: 16  Height: 5\' 7"  (1.702 m)  Weight: 230 lb (104.327 kg)    GEN- The patient is well appearing, alert and oriented x 3 today.   Head- normocephalic, atraumatic Eyes-  Sclera clear, conjunctiva pink Ears- hearing intact Oropharynx- clear Neck- supple, no JVP Lymph- no cervical lymphadenopathy Lungs- Clear to ausculation bilaterally, normal work of breathing Heart- Regular rate and rhythm, no murmurs, rubs or gallops, PMI not laterally displaced GI- soft, NT, ND, + BS Extremities- no clubbing, cyanosis, or edema MS- no significant deformity or atrophy Skin- no rash or lesion Psych- euthymic mood, full affect Neuro- strength and sensation are intact  EKG today reveals sinus bradycardia 54 bpm, PR 230, QRS 102, Qtc 400,  LVH  Assessment and Plan:

## 2011-03-11 ENCOUNTER — Ambulatory Visit (HOSPITAL_COMMUNITY): Payer: PRIVATE HEALTH INSURANCE | Attending: Internal Medicine | Admitting: Radiology

## 2011-03-11 DIAGNOSIS — R0989 Other specified symptoms and signs involving the circulatory and respiratory systems: Secondary | ICD-10-CM | POA: Insufficient documentation

## 2011-03-11 DIAGNOSIS — I4891 Unspecified atrial fibrillation: Secondary | ICD-10-CM | POA: Insufficient documentation

## 2011-03-11 DIAGNOSIS — R0609 Other forms of dyspnea: Secondary | ICD-10-CM | POA: Insufficient documentation

## 2011-03-11 DIAGNOSIS — R5381 Other malaise: Secondary | ICD-10-CM | POA: Insufficient documentation

## 2011-03-11 DIAGNOSIS — R079 Chest pain, unspecified: Secondary | ICD-10-CM | POA: Insufficient documentation

## 2011-03-11 DIAGNOSIS — R5383 Other fatigue: Secondary | ICD-10-CM | POA: Insufficient documentation

## 2011-03-18 ENCOUNTER — Encounter: Payer: Self-pay | Admitting: *Deleted

## 2011-03-19 ENCOUNTER — Other Ambulatory Visit (INDEPENDENT_AMBULATORY_CARE_PROVIDER_SITE_OTHER): Payer: PRIVATE HEALTH INSURANCE | Admitting: *Deleted

## 2011-03-19 DIAGNOSIS — I4891 Unspecified atrial fibrillation: Secondary | ICD-10-CM

## 2011-03-19 LAB — BASIC METABOLIC PANEL
Calcium: 9.2 mg/dL (ref 8.4–10.5)
GFR: 73.99 mL/min (ref >60.00)
Potassium: 4.2 mEq/L (ref 3.5–5.1)
Sodium: 141 mEq/L (ref 135–145)

## 2011-03-19 LAB — CBC WITH DIFFERENTIAL/PLATELET
Basophils Absolute: 0 10*3/uL (ref 0.0–0.1)
Eosinophils Relative: 1.8 % (ref 0.0–5.0)
HCT: 43.2 % (ref 39.0–52.0)
Hemoglobin: 14.3 g/dL (ref 13.0–17.0)
Lymphocytes Relative: 35 % (ref 12.0–46.0)
Lymphs Abs: 2.4 10*3/uL (ref 0.7–4.0)
Monocytes Relative: 6.7 % (ref 3.0–12.0)
Neutro Abs: 3.9 10*3/uL (ref 1.4–7.7)
WBC: 6.9 10*3/uL (ref 4.5–10.5)

## 2011-03-22 ENCOUNTER — Ambulatory Visit (HOSPITAL_BASED_OUTPATIENT_CLINIC_OR_DEPARTMENT_OTHER): Payer: PRIVATE HEALTH INSURANCE | Attending: Internal Medicine

## 2011-03-22 ENCOUNTER — Telehealth: Payer: Self-pay | Admitting: Internal Medicine

## 2011-03-22 DIAGNOSIS — I491 Atrial premature depolarization: Secondary | ICD-10-CM | POA: Insufficient documentation

## 2011-03-22 DIAGNOSIS — G4733 Obstructive sleep apnea (adult) (pediatric): Secondary | ICD-10-CM | POA: Insufficient documentation

## 2011-03-22 DIAGNOSIS — I4891 Unspecified atrial fibrillation: Secondary | ICD-10-CM | POA: Insufficient documentation

## 2011-03-22 NOTE — Telephone Encounter (Signed)
2-D echo reviewed per Dr. Johney Frame MD, result given to pt.and faxed to pt as requested.

## 2011-03-22 NOTE — Telephone Encounter (Signed)
Per pt call, pt calling about having a ECHO-contrast. Pt stated he MD told him he needed to have this proceduer done, however there was no order. Please return pt call and advise.

## 2011-03-22 NOTE — Telephone Encounter (Signed)
Per pt call, pt is having Ablation on Wednesday however, pt has yet to receive test results of ultrasound. Please return pt call to advise/ discuss.

## 2011-03-22 NOTE — Telephone Encounter (Signed)
Pt is having sleep study tonight at [facility] long, then TEE tomorrow at 11am and pt is to have an ablation on Wednesday morning and he needs to be at [facility] pt wants to know if he can do TEE earlier because he will already be in [place] and he needs to confirm all appt's to make sure he has everything in order.

## 2011-03-22 NOTE — Telephone Encounter (Signed)
Left a message to call back.

## 2011-03-22 NOTE — Telephone Encounter (Signed)
Left a message

## 2011-03-22 NOTE — Telephone Encounter (Signed)
Patient was made aware needs to be at shot stay center at [facility] on 03/23/11  at 10:00 am for TEE, and at 06:30 Am on 03/24/11 for the ablation. Pt. Verbalized understanding.

## 2011-03-23 ENCOUNTER — Ambulatory Visit (HOSPITAL_COMMUNITY)
Admission: RE | Admit: 2011-03-23 | Discharge: 2011-03-23 | Disposition: A | Discharge status: Home / Self Care | Payer: PRIVATE HEALTH INSURANCE | Source: Ambulatory Visit | Attending: Cardiovascular Disease | Admitting: Cardiovascular Disease

## 2011-03-23 ENCOUNTER — Telehealth: Payer: Self-pay | Admitting: Internal Medicine

## 2011-03-23 DIAGNOSIS — I4891 Unspecified atrial fibrillation: Secondary | ICD-10-CM

## 2011-03-23 NOTE — Telephone Encounter (Signed)
Spoke with patient and let him know he will need someone to drive him home

## 2011-03-23 NOTE — Telephone Encounter (Signed)
Per pt call, pt has TEE scheduled this morning at 11 am, pt is wondering if he can drive himself there and back. Please return pt call to advise.

## 2011-03-24 ENCOUNTER — Ambulatory Visit (HOSPITAL_COMMUNITY)
Admission: RE | Admit: 2011-03-24 | Discharge: 2011-03-25 | Disposition: A | Discharge status: Home / Self Care | Payer: PRIVATE HEALTH INSURANCE | Source: Ambulatory Visit | Attending: Internal Medicine | Admitting: Internal Medicine

## 2011-03-24 DIAGNOSIS — I4891 Unspecified atrial fibrillation: Secondary | ICD-10-CM

## 2011-03-24 DIAGNOSIS — Z7901 Long term (current) use of anticoagulants: Secondary | ICD-10-CM | POA: Insufficient documentation

## 2011-03-24 DIAGNOSIS — E785 Hyperlipidemia, unspecified: Secondary | ICD-10-CM | POA: Insufficient documentation

## 2011-03-24 DIAGNOSIS — E669 Obesity, unspecified: Secondary | ICD-10-CM | POA: Insufficient documentation

## 2011-03-24 DIAGNOSIS — I498 Other specified cardiac arrhythmias: Secondary | ICD-10-CM | POA: Insufficient documentation

## 2011-03-24 DIAGNOSIS — I10 Essential (primary) hypertension: Secondary | ICD-10-CM | POA: Insufficient documentation

## 2011-03-24 HISTORY — PX: ATRIAL ABLATION SURGERY: SHX560

## 2011-03-24 LAB — POCT ACTIVATED CLOTTING TIME
Activated Clotting Time: 232 seconds
Activated Clotting Time: 303 seconds
Activated Clotting Time: 171 seconds

## 2011-03-25 NOTE — Consult Note (Signed)
  NAME:  Jorge Peterson, Jorge Peterson              ACCOUNT NO.:  [id number]  MEDICAL RECORD NO.:  [id number]  LOCATION:  [facility]                         FACILITY:  [facility]  PHYSICIAN:  Noralyn Pick. Eden Emms, MD, FACCDATE OF BIRTH:  1961/04/08  DATE OF Procedure:  03/23/2011 DATE OF DISCHARGE:                                TEE   A 50 year old patient with pre-atrial fibrillation ablation.  Note should be made that the patient was in sinus rhythm at a rate of 52 during the procedure.  He received 5 mg of Versed and 50 mcg of fentanyl.  Using digital technique, an Omniplane probe was advanced into distal esophagus without incident.  Left ventricular cavity size and function were normal.  EF was 60%.  Mitral, aortic, tricuspid, and pulmonary valves were structurally normal.  There was trivial MR. Aortic valve was trileaflet.  Aortic root was normal.  Left atrium actually was not enlarged.  There was no spontaneous contrast.  Left atrial appendage was well visualized in orthogonal planes.  There was no spontaneous contrast or thrombus.  Imaging of the aorta showed no significant debris.  FINAL IMPRESSION: 1. The patient cleared to have atrial fibrillation ablation, left     atrial appendage, with no spontaneous contrast or thrombus. 2. Normal atrial sizes. 3. Normal left ventricle, EF 60%. 4. Normal aortic valve. 5. Trivial mitral regurgitation. 6. Normal right-sided cardiac chambers. 7. No aortic debris.  The patient tolerated the procedure well.     Noralyn Pick. Eden Emms, MD, [facility]     PCN/MEDQ  D:  03/23/2011  T:  03/23/2011  Job:  [id number]  cc:   Hillis Range, MD  Electronically Signed by Charlton Haws MD [facility] on 03/25/2011 01:52:41 PM

## 2011-03-26 ENCOUNTER — Telehealth: Payer: Self-pay | Admitting: Internal Medicine

## 2011-03-26 DIAGNOSIS — I4891 Unspecified atrial fibrillation: Secondary | ICD-10-CM

## 2011-03-26 NOTE — Telephone Encounter (Signed)
Stop Asa per Dr Johney Frame  Pt aware

## 2011-03-26 NOTE — Telephone Encounter (Signed)
pradaxa refill needed, [facility] in [place]

## 2011-03-26 NOTE — Telephone Encounter (Signed)
BP 150/94  Discussed with Dr Johney Frame  He does not want to treat at the present

## 2011-03-26 NOTE — Telephone Encounter (Signed)
Pt just got out of the hospital and wife said problem with bp

## 2011-03-26 NOTE — Telephone Encounter (Signed)
Pt wants to know if he is to continue to take aspirin 81mg  with his pradaxa

## 2011-03-29 MED ORDER — DABIGATRAN ETEXILATE MESYLATE 150 MG PO CAPS: 150.0000 mg | ORAL_CAPSULE | Freq: Two times a day (BID) | ORAL | Status: DC

## 2011-03-29 NOTE — Telephone Encounter (Signed)
RX sent into pharmacy. Pt notified. 

## 2011-03-31 ENCOUNTER — Telehealth: Payer: Self-pay | Admitting: Internal Medicine

## 2011-03-31 NOTE — Telephone Encounter (Signed)
Insurance is asking for prior authorization - [employer]. [facility] on [address] [phone].

## 2011-03-31 NOTE — Telephone Encounter (Signed)
Per pt call, pt needs Jorge Peterson to call pt insurance company regarding pradaxa. Insurance company says pt has to have authorization in order to give pt pradaxa refill. Please return pt call with questions.

## 2011-04-01 ENCOUNTER — Other Ambulatory Visit: Payer: Self-pay | Admitting: Internal Medicine

## 2011-04-01 DIAGNOSIS — I491 Atrial premature depolarization: Secondary | ICD-10-CM

## 2011-04-01 DIAGNOSIS — G4733 Obstructive sleep apnea (adult) (pediatric): Secondary | ICD-10-CM

## 2011-04-01 DIAGNOSIS — I4891 Unspecified atrial fibrillation: Secondary | ICD-10-CM

## 2011-04-01 NOTE — Telephone Encounter (Signed)
Patient aware samples out front and aware Jorge Peterson will call him once prior Auth is done

## 2011-04-01 NOTE — Telephone Encounter (Signed)
Per pt call, this is the third time pt has called regarding pt Pradaxa refill. Pt said he missed this mornings dose b/c he is out of medication. Pt said RX was called in one time and insurance company stopped it b/c they said RX has to be pre approved through DR. Office.   Please return pt call on mobile at [phone] to inform when RX has been called in and pre-approved through insurance.

## 2011-04-01 NOTE — Telephone Encounter (Signed)
Called [phone] for PA they sent info to review and will let us know outcome within 72 hrs, pt is aware and is coming to office to get samples

## 2011-04-01 NOTE — Telephone Encounter (Signed)
Patient has called the [place] office because he states that he has been unable to contact anyone in the [place] office in regards to his Pradaxa.  Patient states that the pharmacy is needing a prior auth before filling the medication.  The patient is out of the medication and would like samples.  Please advise.

## 2011-04-02 NOTE — Procedures (Signed)
NAME:  Jorge Peterson, Jorge Peterson              ACCOUNT NO.:  [id number]  MEDICAL RECORD NO.:  [id number]          PATIENT TYPE:  OUT  LOCATION:  SLEEP CENTER                 FACILITY[facility]  [facility]  PHYSICIAN:  Barbaraann Share, MD,FCCPDATE OF BIRTH:  1961/04/05  DATE OF STUDY:  03/22/2011                           NOCTURNAL POLYSOMNOGRAM  REFERRING PHYSICIAN:  Hillis Range, MD  INDICATION FOR STUDY:  Hypersomnia with sleep apnea.  EPWORTH SLEEPINESS SCORE:  8.  MEDICATIONS:  SLEEP ARCHITECTURE:  The patient had a total sleep time of 303 minutes with very little slow wave sleep and only 56 minutes of REM.  Sleep onset latency was normal at 8 minutes and REM onset was normal at 51 minutes.  Sleep efficiency was mildly reduced at 82%.  RESPIRATORY DATA:  The patient was found to have no obstructive apneas and 40 obstructive hypopneas, giving him an apnea/hypopnea index of 8 events per hour.  The events were more prominent in the supine position and there was loud snoring noted throughout.  The patient did not meet split night protocol secondary to most of his events occurring well after 1 a.m.  OXYGEN DATA:  There was O2 desaturation as low as 84% with the patient's obstructive events.  CARDIAC DATA:  The patient was found to have 1 episode of atrial fibrillation with a controlled ventricular response, as well as frequent PACs throughout the night.  MOVEMENT-PARASOMNIA:  The patient had no significant leg jerks or other abnormal behavior seen.  IMPRESSIONS-RECOMMENDATIONS: 1. Mild obstructive sleep apnea/hypopnea syndrome with an     apnea/hypopnea index of 8 events per hour and O2 desaturation as     low as 84%.  Treatment for this degree of sleep apnea can include a     trial of weight loss alone, upper airway surgery, dental appliance,     and also CPAP.  Clinical correlation is suggested regarding     treatment, and should be based on the patient's symptomatology and     also the  significance of comorbid medical issues. 2. Frequent premature atrial contraction seen, as well as 1 episode of     atrial fibrillation with a controlled ventricular response.     Barbaraann Share, MD,FCCP Diplomate, American Board of Sleep Medicine Electronically Signed    [facility]/MEDQ  D:  04/01/2011 13:41:58  T:  04/01/2011 23:01:04  Job:  [id number]

## 2011-04-03 NOTE — Telephone Encounter (Signed)
Ins co called  Given a 72 hour wait period  Pt aware

## 2011-04-08 ENCOUNTER — Telehealth: Payer: Self-pay | Admitting: *Deleted

## 2011-04-08 NOTE — Telephone Encounter (Signed)
Pt asking what he should do, or if he should be concerned when his HR drops to 35 at times since his ablation. It normally runs in ~60s, but he will feel it decr and checks with BP machine, BP normal, but HR in 30s. He states he had these episodes prior to his ablation (2 weeks ago), and now he is not as "tired" when HR that low as prior to procedure. Pt has not taken any beta-blockers since RFA.  Pt understands that he may not see a change for 3 months since ablation, just wants to know if this is normal. Please advise.

## 2011-04-09 ENCOUNTER — Encounter: Payer: Self-pay | Admitting: Internal Medicine

## 2011-04-11 NOTE — Telephone Encounter (Signed)
Continue to follow at this time. No new recs

## 2011-04-12 NOTE — Telephone Encounter (Signed)
Needs letter of medical necessity for Pradaxa  And fax to [phone]

## 2011-04-13 MED ORDER — DABIGATRAN ETEXILATE MESYLATE 150 MG PO CAPS: 150.0000 mg | ORAL_CAPSULE | Freq: Two times a day (BID) | ORAL | Status: DC

## 2011-04-19 ENCOUNTER — Encounter: Payer: Self-pay | Admitting: Internal Medicine

## 2011-04-22 ENCOUNTER — Telehealth: Payer: Self-pay | Admitting: *Deleted

## 2011-04-22 NOTE — Telephone Encounter (Signed)
His bradycardia preceeds his ablation.  He has previously been asymptomatic No new recs at this time.

## 2011-04-22 NOTE — Op Note (Signed)
NAME:  Jorge Peterson, Jorge Peterson              ACCOUNT NO.:  [id number]  MEDICAL RECORD NO.:  [id number]  LOCATION:  2923                         FACILITY:  [facility]  PHYSICIAN:  Hillis Range, MD       DATE OF BIRTH:  December 12, 1960  DATE OF PROCEDURE:  03/24/2011 DATE OF DISCHARGE:                              OPERATIVE REPORT   SURGEON:  Hillis Range, MD  PREPROCEDURE DIAGNOSIS:  Paroxysmal atrial fibrillation.  POSTPROCEDURE DIAGNOSIS:  Paroxysmal atrial fibrillation.  PROCEDURES: 1. Comprehensive electrophysiologic study. 2. Coronary sinus pacing and recording. 3. 3-D mapping of supraventricular tachycardia. 4. Radiofrequency ablation of supraventricular tachycardia. 5. Arterial blood pressure monitoring. 6. Intracardiac echocardiography. 7. Transseptal puncture of an intact septum. 8. Pulmonary vein venography. 9. Isoproterenol infusion.  INTRODUCTION:  Jorge Peterson is a pleasant 50 year old gentleman with a history of paroxysmal atrial fibrillation who presents today for EP study and radiofrequency ablation.  He reports increasing frequency and duration of atrial fibrillation.  He has previously failed medical therapy with flecainide and beta-blockers.  He therefore presents today for EP study and radiofrequency ablation.  DESCRIPTION OF PROCEDURE:  Informed written consent was obtained and the patient was brought to the electrophysiology lab in the fasting state. He was adequately sedated with intravenous medications as outlined in the anesthesia report.  The patient's right and left groins were prepped and draped in the usual sterile fashion by the EP lab staff.  Using a percutaneous Seldinger technique, two 7-French and one 8-French hemostasis sheaths were placed in the right common femoral vein.  A 4- [place] hemostasis sheath was placed in the right common femoral artery for blood pressure monitoring.  An 11-French hemostasis sheath was placed into the left common femoral vein.   A 7-French Biosense Webster decapolar coronary sinus catheter was introduced through the right common femoral vein and advanced into the coronary sinus for recording and pacing from this location.  A 6-French quadripolar Josephson catheter was introduced through the right common femoral vein and advanced into the right ventricle for recording and pacing.  This catheter was then pulled back to the His bundle location.  The patient presented to the electrophysiology lab in sinus bradycardia.  His PR interval measured 235 milliseconds with a QRS duration of 95 milliseconds and a QT interval of 471 milliseconds.  His RR interval measured 1343 milliseconds.  His AH interval measured 165 milliseconds with an HV interval of 48 milliseconds.  Ventricular pacing was performed which revealed VA dissociation at baseline.  Atrial pacing revealed an AV Wenckebach cycle length of 650 milliseconds.  A 10-French Biosense Webster AcuNav intracardiac echocardiography catheter was introduced through the left common femoral vein and advanced into the right atrium.  Intracardiac echocardiography was performed which revealed a rather normal-sized left atrium.  The patient was noted to have 4 separate pulmonary veins with separate ostia which were all moderate in size.  The middle right common femoral vein sheath was exchanged for an 8.5 [place] SL2 transseptal sheath and transseptal access was achieved in a standard fashion using a Brockenbrough needle under biplane fluoroscopy with intracardiac echocardiography confirmation of the transseptal puncture.  Once transseptal access had been achieved, heparin was administered intravenously and  intra- arterially in order to maintain an ACT of greater than 300 seconds throughout the procedure.  A 6-French multipurpose angiographic catheter with guidewire was introduced through the transseptal sheath and positioned over the mouth of all 4 pulmonary veins.  Pulmonary  venograms were performed by hand injection of nonionic contrast and demonstrated moderate-sized pulmonary veins with no evidence of pulmonary vein stenosis.  There was a small right middle branch off the left superior pulmonary vein.  There was also another small middle branch off the right inferior pulmonary vein.  The angiographic catheter was then removed.  The His bundle catheter was removed and in its place a 3.5 mm Biosense Webster EZ [place] ThermoCool ablation catheter was advanced into the right atrium.  The transseptal sheath was pulled back into the IVC over a guidewire.  The ablation catheter was advanced across the transseptal hole using the wire as a guide.  The transseptal sheath was then re-advanced over the guidewire into the left atrium.  A 20-mm dual decapolar circular mapping catheter was introduced through the transseptal sheath and positioned over the mouth of all 4 pulmonary veins.  Three-dimensional electroanatomical mapping was performed using CARTO technology.  This demonstrated electrical activity within all 4 pulmonary veins at baseline.  The patient therefore underwent successful sequential electrical isolation and anatomical encircling of all 4 pulmonary veins using radiofrequency current with a circular mapping catheter as a guide.  The small middle branches of the right superior and right inferior pulmonary veins were isolated at their common segment with the right superior pulmonary vein and right inferior pulmonary veins respectively.  Following ablation, isoproterenol was infused up to 20 mcg per minute with an accelerated response in heart rate observed. The patient was again evaluated and found to have electrical isolation of all 4 pulmonary veins.  Isoproterenol was therefore allowed to wash out.  Following ablation, rapid atrial pacing revealed an AV Wenckebach cycle length of 650 milliseconds.  No arrhythmias were observed when pacing down to a cycle  length of 300 milliseconds.  The ablation catheter was therefore pulled back into the right atrium.  The circular mapping catheter was pulled back into the right atrium and positioned at the junction of the superior vena cava and right atrium.  A series of radiofrequency applications were delivered in a circular fashion around the junction of the SVC and right atrium.  Prior to each ablation lesion, pacing was performed from the distal ablation electrode at maximum output to assure that no diaphragmatic stimulation was observed. In addition during radiofrequency ablation, the diaphragm was evaluated fluoroscopically to make sure that the phrenic nerve damage did not occur.  Following successful isolation of the superior vena cava, the procedure was therefore considered completed.  All catheters were removed and the sheaths were aspirated and flushed.  The patient was transferred to the recovery area for sheath removal per protocol.  A limited bedside transthoracic echocardiogram revealed no pericardial effusion.  There were no early apparent complications.  CONCLUSIONS: 1. Sinus rhythm upon presentation. 2. Successful electrical isolation and anatomical encircling of all 4     pulmonary veins using radiofrequency current.  There was transient     atrial fibrillation with catheter manipulation within the left     inferior pulmonary vein which terminated with electrical isolation     of this vein. 3. The superior vena cava was electrically isolated. 4. No inducible arrhythmias following ablation both on and off of     isoproterenol up to 20 mcg  per minute. 5. No early apparent complications.     Hillis Range, MD     JA/MEDQ  D:  03/24/2011  T:  03/24/2011  Job:  [id number]  cc:   Antonieta Iba, MD Dr. Burnett Sheng  Electronically Signed by Hillis Range MD on 04/22/2011 09:41:22 AM

## 2011-04-22 NOTE — Discharge Summary (Signed)
NAME:  Jorge Peterson, Jorge Peterson              ACCOUNT NO.:  [id number]  MEDICAL RECORD NO.:  [id number]  LOCATION:  2923                         FACILITY:  [facility]  PHYSICIAN:  Hillis Range, MD       DATE OF BIRTH:  May 07, 1961  DATE OF ADMISSION:  03/24/2011 DATE OF DISCHARGE:  03/25/2011                              DISCHARGE SUMMARY   PRIMARY CARE PHYSICIAN:  Jerl Mina, MD at [facility].  PRIMARY CARDIOLOGIST:  Antonieta Iba, MD  ELECTROPHYSIOLOGIST:  Hillis Range, MD  PRIMARY DIAGNOSIS:  Atrial fibrillation.  SECONDARY DIAGNOSES: 1. Anticoagulation with Pradaxa. 2. Hyperlipidemia. 3. Hypertension. 4. Bradycardia. 5. Obesity. 6. Status post bilateral rotator cuff surgery repair in 2009.  ALLERGIES:  The patient is allergic to CODEINE, PERCOCET, and LATEX.  PROCEDURES THIS ADMISSION:  Electrophysiology study and radiofrequency catheter ablation of atrial fibrillation on March 24, 2011, by Dr. Johney Frame.  This study demonstrated sinus rhythm upon presentation, successful electrical isolation and anatomical encircling of all 4 pulmonary veins using radiofrequency current.  The patient did have transient atrial fibrillation with catheter manipulation.  The patient had no inducible arrhythmias following ablation by his on and off Isuprel.  The patient had no early apparent complications.  BRIEF HISTORY OF PRESENT ILLNESS:  Jorge Peterson is a 50 year old male with a history of paroxysmal atrial fibrillation.  He has failed medical therapy with Flecainide and beta-blockers.  He is symptomatic with his atrial fibrillation with fatigue and palpitations.  He was evaluated by Dr. Johney Frame in the outpatient setting for treatment options.  Risks, benefits, and alternatives to ablation were reviewed with the patient and he wished to proceed.  HOSPITAL COURSE:  The patient was admitted on March 24, 2011, for planned ablation of atrial fibrillation.  He had undergone TEE on July 24 by  Dr. Eden Emms who was demonstrated an EF of 60% and no thrombus in the left atrium.  Ablation was carried out by Dr. Johney Frame with details as outlined above.  Notably during sheath pull, the patient did have a vagal episode where he had a period of profound bradycardia.  This resolved.  The patient also had some transient hypotension postprocedure, which was treated with fluid bolus.  Limited bedside echo was performed by Dr. Johney Frame, which demonstrated no effusion.  On the morning of March 25, 2011, the patient's telemetry demonstrated sinus rhythm.  His blood pressure was 107/55.  The patient's groin incision was without hematoma or bruit.  He was examined by Dr. Johney Frame and considered stable for discharge.  FOLLOWUP APPOINTMENTS: 1. Dr. Johney Frame on May 10, 2011, at 12 noon. 2. Dr. Mariah Milling as scheduled. 3. Dr. Burnett Sheng as scheduled.  DISCHARGE INSTRUCTIONS: 1. Increase activity slowly. 2. No driving for 4 days. 3. Follow a low-sodium, heart-healthy diet. 4. Keep incisions clean and dry.  DISCHARGE MEDICATIONS: 1. Protonix 40 mg daily. 2. Aspirin 81 mg daily. 3. Bystolic 5 mg 1 tablet daily as needed for hypertension. 4. Coenzyme Q10 daily. 5. Folic acid 0.4 mg daily. 6. Fish oil 1200 mg 2 capsules daily. 7. Multivitamin daily. 8. Pradaxa 150 mg 1 capsule twice daily. 9. Simvastatin 40 mg daily.  DISPOSITION:  The patient was seen and  examined by Dr. Johney Frame on March 25, 2011, and considered stable for discharge.  DURATION OF DISCHARGE ENCOUNTER:  35 minutes.     Gypsy Balsam, RN,BSN   ______________________________ Hillis Range, MD    AS/MEDQ  D:  03/25/2011  T:  03/25/2011  Job:  [id number]  cc:   Antonieta Iba, MD Jerl Mina, MD  Electronically Signed by Gypsy Balsam RNBSN on 03/25/2011 05:01:01 PM Electronically Signed by Hillis Range MD on 04/22/2011 09:41:19 AM

## 2011-04-22 NOTE — Telephone Encounter (Signed)
Pt calling this AM with concerns of his low HR in 30s. This has been consistent since his ablation, and he called 2 weeks ago with same concern, we told him to continue to monitor, no new recommendations at that time. He is asymptomatic with low HR, other than c/o "feel like I have to cough to catch my breath," and these are rare. BP normal at 117/74. Pt wants to make sure that nothing needs to be done. Does he need f/u? Please advise.

## 2011-04-22 NOTE — Telephone Encounter (Signed)
cervical hyperextension sprain is aware of dr allred's recommendations. Discussed symptoms pt should be aware of and call with. Jorge Peterson

## 2011-05-07 ENCOUNTER — Telehealth: Payer: Self-pay | Admitting: Internal Medicine

## 2011-05-07 NOTE — Telephone Encounter (Signed)
Needs letter of medical necessity written Will give more samples until letter written

## 2011-05-07 NOTE — Telephone Encounter (Signed)
Pt calling regarding pt RX. Please call back.

## 2011-05-10 ENCOUNTER — Ambulatory Visit (INDEPENDENT_AMBULATORY_CARE_PROVIDER_SITE_OTHER): Payer: PRIVATE HEALTH INSURANCE | Admitting: Internal Medicine

## 2011-05-10 ENCOUNTER — Encounter: Payer: Self-pay | Admitting: Internal Medicine

## 2011-05-10 VITALS — BP 128/72 | HR 50 | Ht 67.0 in | Wt 221.8 lb

## 2011-05-10 DIAGNOSIS — I4891 Unspecified atrial fibrillation: Secondary | ICD-10-CM

## 2011-05-10 DIAGNOSIS — R001 Bradycardia, unspecified: Secondary | ICD-10-CM

## 2011-05-10 DIAGNOSIS — I498 Other specified cardiac arrhythmias: Secondary | ICD-10-CM

## 2011-05-10 NOTE — Progress Notes (Signed)
The patient presents today for routine electrophysiology followup.  Since his recent afib ablation, the patient reports doing very well.  He denies procedure related complications.  He reports rare palpitations but feels that he is maintaining sinus rhythm.  He continues to have stable bradycardia with HR 40s/50s for which he remains mostly asymptomatic.  Today, he denies symptoms of chest pain, shortness of breath, orthopnea, PND, lower extremity edema, dizziness, presyncope, syncope, or neurologic sequela.  The patient feels that he is tolerating medications without difficulties and is otherwise without complaint today.   Past Medical History  Diagnosis Date  . Atrial fibrillation     s/p afib ablation 7/12  . Hyperlipidemia   . Hypertension   . Bradycardia    Past Surgical History  Procedure Date  . Shoulder surgery     x 2  . Back surgery   . Atrial ablation surgery 03/24/11    PVI by JA    Current Outpatient Prescriptions  Medication Sig Dispense Refill  . BIOTIN PO Take by mouth daily.        . Coenzyme Q10 (COQ10) 150 MG CAPS Take 1 capsule by mouth daily.        . dabigatran (PRADAXA) 150 MG CAPS Take 1 capsule (150 mg total) by mouth every 12 (twelve) hours.  60 capsule  6  . fish oil-omega-3 fatty acids 1000 MG capsule Take 1 g by mouth daily.        . folic acid (FOLVITE) 400 MCG tablet Take 400 mcg by mouth daily.        . Multiple Vitamin (MULTIVITAMIN) tablet Take 1 tablet by mouth daily.        . simvastatin (ZOCOR) 40 MG tablet Take 40 mg by mouth at bedtime.          Allergies  Allergen Reactions  . Oxycodone-Acetaminophen     History   Social History  . Marital Status: Married    Spouse Name: N/A    Number of Children: N/A  . Years of Education: N/A   Occupational History  . Full-time    Social History Main Topics  . Smoking status: Never Smoker   . Smokeless tobacco: Never Used  . Alcohol Use: No  . Drug Use: No  . Sexually Active:    Other  Topics Concern  . Not on file   Social History Narrative   Lives in [place].  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    Family History  Problem Relation Age of Onset  . Atrial fibrillation Mother   . Atrial fibrillation Father   . Diabetes Father   . Heart disease Father   . Heart disease Mother     Physical Exam: Filed Vitals:   05/10/11 1207  BP: 128/72  Pulse: 50  Height: 5\' 7"  (1.702 m)  Weight: 221 lb 12.8 oz (100.608 kg)    GEN- The patient is well appearing, alert and oriented x 3 today.   Head- normocephalic, atraumatic Eyes-  Sclera clear, conjunctiva pink Ears- hearing intact Oropharynx- clear Neck- supple, no JVP Lymph- no cervical lymphadenopathy Lungs- Clear to ausculation bilaterally, normal work of breathing Heart- Regular rate and rhythm, no murmurs, rubs or gallops, PMI not laterally displaced GI- soft, NT, ND, + BS Extremities- no clubbing, cyanosis, or edema MS- no significant deformity or atrophy Skin- no rash or lesion Psych- euthymic mood, full affect Neuro- strength and sensation are intact  ekg today reveals sinus  bradycardia 50 bpm, PR 222, LVH, otherwise normal ekg  Assessment and Plan:

## 2011-05-10 NOTE — Assessment & Plan Note (Signed)
Improved Asymptomatic at this time

## 2011-05-10 NOTE — Assessment & Plan Note (Signed)
Maintaining sinus rhythm post ablation Continue pradaxa until return and likely stop pradaxa at that time No changes today

## 2011-05-10 NOTE — Patient Instructions (Signed)
Your physician recommends that you schedule a follow-up appointment in: 6 weeks with Dr Allred  

## 2011-06-11 ENCOUNTER — Telehealth: Payer: Self-pay | Admitting: Internal Medicine

## 2011-06-11 NOTE — Telephone Encounter (Signed)
Returned call to patient he has enough Pradaxa to last until Sun morning  His appointment is Monday morning  He says he is doing great and wondered if her needed to continue  I have discussed with Dr Johney Frame  He will only have missed one dose on Monday if he needed to continue  However Dr  Johney Frame says go ahead and take through Sun  Then stop  Patient aware

## 2011-06-11 NOTE — Telephone Encounter (Signed)
Called pt to confirm appt, and wants to talk to [facility], has question

## 2011-06-14 ENCOUNTER — Ambulatory Visit (INDEPENDENT_AMBULATORY_CARE_PROVIDER_SITE_OTHER): Payer: PRIVATE HEALTH INSURANCE | Admitting: Internal Medicine

## 2011-06-14 ENCOUNTER — Encounter: Payer: Self-pay | Admitting: Internal Medicine

## 2011-06-14 VITALS — BP 129/88 | HR 58 | Ht 67.0 in | Wt 224.0 lb

## 2011-06-14 DIAGNOSIS — I4891 Unspecified atrial fibrillation: Secondary | ICD-10-CM

## 2011-06-14 NOTE — Progress Notes (Signed)
The patient presents today for routine electrophysiology followup.  Since his recent afib ablation, the patient reports doing very well.  He denies procedure related complications. He is maintaining sinus rhythm.  He continues to have stable bradycardia with HR 40s/50s for which he remains asymptomatic.  Today, he denies symptoms of chest pain, shortness of breath, orthopnea, PND, lower extremity edema, dizziness, presyncope, syncope, or neurologic sequela.  The patient feels that he is tolerating medications without difficulties and is otherwise without complaint today.   Past Medical History  Diagnosis Date  . Atrial fibrillation     s/p afib ablation 7/12  . Hyperlipidemia   . Hypertension   . Bradycardia     asymptomatic  . Sleep apnea     mild   Past Surgical History  Procedure Date  . Shoulder surgery     x 2  . Back surgery   . Atrial ablation surgery 03/24/11    PVI by JA    Current Outpatient Prescriptions  Medication Sig Dispense Refill  . BIOTIN PO Take by mouth daily.        . Coenzyme Q10 (COQ10) 150 MG CAPS Take 1 capsule by mouth daily.        . fish oil-omega-3 fatty acids 1000 MG capsule Take 1 g by mouth daily.        . folic acid (FOLVITE) 400 MCG tablet Take 400 mcg by mouth daily.        . Multiple Vitamin (MULTIVITAMIN) tablet Take 1 tablet by mouth daily.        . simvastatin (ZOCOR) 40 MG tablet Take 40 mg by mouth at bedtime.          Allergies  Allergen Reactions  . Oxycodone-Acetaminophen     History   Social History  . Marital Status: Married    Spouse Name: N/A    Number of Children: N/A  . Years of Education: N/A   Occupational History  . Full-time    Social History Main Topics  . Smoking status: Never Smoker   . Smokeless tobacco: Never Used  . Alcohol Use: No  . Drug Use: No  . Sexually Active:    Other Topics Concern  . Not on file   Social History Narrative   Lives in [place].  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    Family History  Problem Relation Age of Onset  . Atrial fibrillation Mother   . Atrial fibrillation Father   . Diabetes Father   . Heart disease Father   . Heart disease Mother     Physical Exam: Filed Vitals:   06/14/11 1123 06/14/11 1130  BP: 107/73 129/88  Pulse: 56 58  Height: 5\' 7"  (1.702 m)   Weight: 224 lb (101.606 kg)     GEN- The patient is well appearing, alert and oriented x 3 today.   Head- normocephalic, atraumatic Eyes-  Sclera clear, conjunctiva pink Ears- hearing intact Oropharynx- clear Neck- supple, no JVP Lymph- no cervical lymphadenopathy Lungs- Clear to ausculation bilaterally, normal work of breathing Heart- Regular rate and rhythm, no murmurs, rubs or gallops, PMI not laterally displaced GI- soft, NT, ND, + BS Extremities- no clubbing, cyanosis, or edema MS- no significant deformity or atrophy Skin- no rash or lesion Psych- euthymic mood, full affect Neuro- strength and sensation are intact  ekg today reveals sinus bradycardia 48 bpm, PR 198, LVH, otherwise normal ekg  Assessment and Plan:

## 2011-06-14 NOTE — Patient Instructions (Signed)
Your physician has recommended you make the following change in your medication: Stop Pradaxa  Your physician wants you to follow-up in: 6 months with Dr. Johney Frame. You will receive a reminder letter in the mail two months in advance. If you don't receive a letter, please call our office to schedule the follow-up appointment.

## 2011-06-14 NOTE — Assessment & Plan Note (Signed)
Doing well Maintaining sinus rhythm off of AAD  Stop pradaxa today  Return in 6 months

## 2011-08-13 ENCOUNTER — Ambulatory Visit: Payer: Self-pay | Admitting: Gastroenterology

## 2011-08-13 ENCOUNTER — Telehealth: Payer: Self-pay | Admitting: Internal Medicine

## 2011-08-13 NOTE — Telephone Encounter (Signed)
Pt having a colonoscopy today at 1:15pm, just thought he should check to see if he needs to take any precautions?

## 2011-08-13 NOTE — Telephone Encounter (Signed)
Spoke with patient and answered his questions  Let him know if he goes out of rhythm to call our office

## 2011-09-23 ENCOUNTER — Ambulatory Visit: Payer: Self-pay | Admitting: Gastroenterology

## 2011-10-23 ENCOUNTER — Inpatient Hospital Stay (HOSPITAL_COMMUNITY)
Admission: EM | Admit: 2011-10-23 | Discharge: 2011-10-25 | DRG: 392 | Disposition: A | Discharge status: Home / Self Care | Payer: PRIVATE HEALTH INSURANCE | Attending: Internal Medicine | Admitting: Internal Medicine

## 2011-10-23 ENCOUNTER — Emergency Department (HOSPITAL_COMMUNITY): Payer: PRIVATE HEALTH INSURANCE

## 2011-10-23 ENCOUNTER — Encounter (HOSPITAL_COMMUNITY): Payer: Self-pay | Admitting: Emergency Medicine

## 2011-10-23 DIAGNOSIS — I10 Essential (primary) hypertension: Secondary | ICD-10-CM | POA: Diagnosis present

## 2011-10-23 DIAGNOSIS — D72829 Elevated white blood cell count, unspecified: Secondary | ICD-10-CM | POA: Diagnosis present

## 2011-10-23 DIAGNOSIS — K5732 Diverticulitis of large intestine without perforation or abscess without bleeding: Principal | ICD-10-CM | POA: Diagnosis present

## 2011-10-23 DIAGNOSIS — K5792 Diverticulitis of intestine, part unspecified, without perforation or abscess without bleeding: Secondary | ICD-10-CM

## 2011-10-23 DIAGNOSIS — E785 Hyperlipidemia, unspecified: Secondary | ICD-10-CM | POA: Diagnosis present

## 2011-10-23 LAB — BASIC METABOLIC PANEL
CO2: 25 mEq/L (ref 19–32)
Chloride: 102 mEq/L (ref 96–112)
Glucose, Bld: 125 mg/dL — ABNORMAL HIGH (ref 70–99)
Potassium: 3.5 mEq/L (ref 3.5–5.1)
Sodium: 139 mEq/L (ref 135–145)

## 2011-10-23 LAB — URINALYSIS, ROUTINE W REFLEX MICROSCOPIC
Glucose, UA: NEGATIVE mg/dL
Hgb urine dipstick: NEGATIVE
Leukocytes, UA: NEGATIVE
Specific Gravity, Urine: 1.022 (ref 1.005–1.030)

## 2011-10-23 LAB — APTT: aPTT: 28 seconds (ref 24–37)

## 2011-10-23 LAB — DIFFERENTIAL
Basophils Absolute: 0 10*3/uL (ref 0.0–0.1)
Lymphs Abs: 2.7 10*3/uL (ref 0.7–4.0)

## 2011-10-23 LAB — ABO/RH: ABO/RH(D): O POS

## 2011-10-23 LAB — CBC
HCT: 39.9 % (ref 39.0–52.0)
Hemoglobin: 13.9 g/dL (ref 13.0–17.0)
MCH: 28.5 pg (ref 26.0–34.0)
MCHC: 34.8 g/dL (ref 30.0–36.0)
RDW: 12.4 % (ref 11.5–15.5)

## 2011-10-23 LAB — LACTIC ACID, PLASMA: Lactic Acid, Venous: 1 mmol/L (ref 0.5–2.2)

## 2011-10-23 LAB — PHOSPHORUS: Phosphorus: 3.4 mg/dL (ref 2.3–4.6)

## 2011-10-23 LAB — PROTIME-INR: INR: 1.07 (ref 0.00–1.49)

## 2011-10-23 LAB — TYPE AND SCREEN: ABO/RH(D): O POS

## 2011-10-23 MED ORDER — HYDROMORPHONE HCL PF 1 MG/ML IJ SOLN
1.0000 mg | INTRAMUSCULAR | Status: AC
  Administered 2011-10-23: 1 mg via INTRAVENOUS

## 2011-10-23 MED ORDER — ONDANSETRON HCL 4 MG PO TABS: 4.0000 mg | ORAL_TABLET | Freq: Four times a day (QID) | ORAL | Status: DC | PRN

## 2011-10-23 MED ORDER — ENOXAPARIN SODIUM 40 MG/0.4ML ~~LOC~~ SOLN
40.0000 mg | SUBCUTANEOUS | Status: DC
  Administered 2011-10-23 – 2011-10-25 (×3): 40 mg via SUBCUTANEOUS
  Filled 2011-10-23 (×3): qty 0.4

## 2011-10-23 MED ORDER — HYDROMORPHONE HCL PF 1 MG/ML IJ SOLN
1.0000 mg | INTRAMUSCULAR | Status: DC | PRN
  Administered 2011-10-23: 1 mg via INTRAVENOUS
  Filled 2011-10-23: qty 1

## 2011-10-23 MED ORDER — FOLIC ACID 0.5 MG HALF TAB
0.5000 mg | ORAL_TABLET | Freq: Every day | ORAL | Status: DC
  Administered 2011-10-23 – 2011-10-25 (×3): 0.5 mg via ORAL
  Filled 2011-10-23 (×3): qty 1

## 2011-10-23 MED ORDER — SODIUM CHLORIDE 0.9 % IV SOLN
INTRAVENOUS | Status: DC
  Administered 2011-10-23: 21:00:00 via INTRAVENOUS

## 2011-10-23 MED ORDER — FOLIC ACID 400 MCG PO TABS: 400.0000 ug | ORAL_TABLET | Freq: Every day | ORAL | Status: DC

## 2011-10-23 MED ORDER — HYDROCODONE-ACETAMINOPHEN 5-325 MG PO TABS: 2.0000 | ORAL_TABLET | Freq: Once | ORAL | Status: DC

## 2011-10-23 MED ORDER — IOHEXOL 300 MG/ML  SOLN
20.0000 mL | INTRAMUSCULAR | Status: AC
  Administered 2011-10-23: 20 mL via ORAL

## 2011-10-23 MED ORDER — SODIUM CHLORIDE 0.9 % IV SOLN
INTRAVENOUS | Status: AC
  Administered 2011-10-23: 08:00:00 via INTRAVENOUS

## 2011-10-23 MED ORDER — HYDROMORPHONE HCL PF 1 MG/ML IJ SOLN
1.0000 mg | Freq: Once | INTRAMUSCULAR | Status: AC
  Administered 2011-10-23: 1 mg via INTRAVENOUS
  Filled 2011-10-23: qty 1

## 2011-10-23 MED ORDER — OXYCODONE HCL 5 MG PO TABS: 5.0000 mg | ORAL_TABLET | ORAL | Status: DC | PRN

## 2011-10-23 MED ORDER — HYDROMORPHONE HCL PF 1 MG/ML IJ SOLN
1.0000 mg | INTRAMUSCULAR | Status: AC
  Administered 2011-10-23: 1 mg via INTRAVENOUS
  Filled 2011-10-23: qty 1

## 2011-10-23 MED ORDER — METRONIDAZOLE IN NACL 5-0.79 MG/ML-% IV SOLN
500.0000 mg | Freq: Three times a day (TID) | INTRAVENOUS | Status: DC
  Administered 2011-10-23 – 2011-10-25 (×7): 500 mg via INTRAVENOUS
  Filled 2011-10-23 (×9): qty 100

## 2011-10-23 MED ORDER — CIPROFLOXACIN IN D5W 400 MG/200ML IV SOLN
400.0000 mg | Freq: Two times a day (BID) | INTRAVENOUS | Status: DC
  Administered 2011-10-23 – 2011-10-25 (×5): 400 mg via INTRAVENOUS
  Filled 2011-10-23 (×6): qty 200

## 2011-10-23 MED ORDER — HYDROMORPHONE HCL PF 1 MG/ML IJ SOLN
INTRAMUSCULAR | Status: AC
  Filled 2011-10-23: qty 1

## 2011-10-23 MED ORDER — ONDANSETRON HCL 4 MG/2ML IJ SOLN: 4.0000 mg | Freq: Four times a day (QID) | INTRAMUSCULAR | Status: DC | PRN

## 2011-10-23 MED ORDER — SIMVASTATIN 40 MG PO TABS
40.0000 mg | ORAL_TABLET | Freq: Every day | ORAL | Status: DC
  Administered 2011-10-23 – 2011-10-24 (×2): 40 mg via ORAL
  Filled 2011-10-23 (×3): qty 1

## 2011-10-23 MED ORDER — IOHEXOL 300 MG/ML  SOLN
100.0000 mL | Freq: Once | INTRAMUSCULAR | Status: AC | PRN
  Administered 2011-10-23: 100 mL via INTRAVENOUS

## 2011-10-23 MED ORDER — DEXTROSE 5 % IV SOLN
1.0000 g | Freq: Once | INTRAVENOUS | Status: AC
  Administered 2011-10-23: 1 g via INTRAVENOUS
  Filled 2011-10-23: qty 10

## 2011-10-23 MED ORDER — DIPHENHYDRAMINE HCL 25 MG PO CAPS
25.0000 mg | ORAL_CAPSULE | Freq: Every evening | ORAL | Status: DC | PRN
  Administered 2011-10-24: 25 mg via ORAL
  Filled 2011-10-23: qty 1

## 2011-10-23 MED ORDER — HYDROMORPHONE HCL PF 1 MG/ML IJ SOLN
1.0000 mg | INTRAMUSCULAR | Status: DC | PRN
  Administered 2011-10-23 (×3): 1 mg via INTRAVENOUS
  Filled 2011-10-23 (×2): qty 1

## 2011-10-23 MED ORDER — SODIUM CHLORIDE 0.9 % IV SOLN
Freq: Once | INTRAVENOUS | Status: AC
  Administered 2011-10-23: 02:00:00 via INTRAVENOUS

## 2011-10-23 NOTE — ED Provider Notes (Signed)
History     CSN: [id number]  Arrival date & time 10/23/11  4098   First MD Initiated Contact with Patient 10/23/11 [phone]      Chief Complaint  Patient presents with  . Abdominal Pain    (Consider location/radiation/quality/duration/timing/severity/associated sxs/prior treatment) HPI Comments: 51 year old male with a history of atrial fibrillation, hyperlipidemia and recent diagnosis of diverticulitis based on CT scan. He presents with a complaint of worsening left lower quadrant pain. He was last on ciprofloxacin and Flagyl approximately one month ago, finished a course of medications and experienced significant improvement. Approximately one week ago the pain recurred in the left lower quadrant and has been gradually getting worse. It is severe this evening and it is worsened with movement, palpation, coughing. There is no associated fevers chills nausea vomiting change in bowel habits or dysuria. He denies chest pain, shortness of breath, rash or any other complaints  Patient is a 51 y.o. male presenting with abdominal pain. The history is provided by the patient and the spouse.  Abdominal Pain The primary symptoms of the illness include abdominal pain.    Past Medical History  Diagnosis Date  . Atrial fibrillation     s/p afib ablation 7/12  . Hyperlipidemia   . Bradycardia     asymptomatic  . Sleep apnea     mild    Past Surgical History  Procedure Date  . Shoulder surgery     x 2  . Back surgery   . Atrial ablation surgery 03/24/11    PVI by JA    Family History  Problem Relation Age of Onset  . Atrial fibrillation Mother   . Atrial fibrillation Father   . Diabetes Father   . Heart disease Father   . Heart disease Mother     History  Substance Use Topics  . Smoking status: Never Smoker   . Smokeless tobacco: Never Used  . Alcohol Use: No      Review of Systems  Gastrointestinal: Positive for abdominal pain.  All other systems reviewed and are  negative.    Allergies  Oxycodone-acetaminophen  Home Medications   Current Outpatient Rx  Name Route Sig Dispense Refill  . CIPROFLOXACIN HCL 500 MG PO TABS Oral Take 500 mg by mouth 2 (two) times daily.    . CO Q-10 150 MG PO CAPS Oral Take 1 capsule by mouth daily.      . OMEGA-3 FATTY ACIDS 1000 MG PO CAPS Oral Take 1 g by mouth daily.      [name] FOLIC ACID 400 MCG PO TABS Oral Take 400 mcg by mouth daily.      [name] METRONIDAZOLE 500 MG PO TABS Oral Take 500 mg by mouth 3 (three) times daily.    [name] ONE-DAILY MULTI VITAMINS PO TABS Oral Take 1 tablet by mouth daily.      [name] PROBIOTIC FORMULA PO Oral Take 1 capsule by mouth daily.    [name] SIMVASTATIN 40 MG PO TABS Oral Take 40 mg by mouth at bedtime.        BP 145/85  Pulse 55  Temp(Src) 97.7 F (36.5 C) (Oral)  Resp 18  SpO2 97%  Physical Exam  Nursing note and vitals reviewed. Constitutional: He appears well-developed and well-nourished. No distress.  HENT:  Head: Normocephalic and atraumatic.  Mouth/Throat: Oropharynx is clear and moist. No oropharyngeal exudate.  Eyes: Conjunctivae and EOM are normal. Pupils are equal, round, and reactive to light. Right eye exhibits no discharge. Left eye  exhibits no discharge. No scleral icterus.  Neck: Normal range of motion. Neck supple. No JVD present. No thyromegaly present.  Cardiovascular: Normal rate, regular rhythm, normal heart sounds and intact distal pulses.  Exam reveals no gallop and no friction rub.   No murmur heard. Pulmonary/Chest: Effort normal and breath sounds normal. No respiratory distress. He has no wheezes. He has no rales.  Abdominal: Soft. Bowel sounds are normal. He exhibits no distension and no mass. There is tenderness ( Moderate tenderness to palpation in the left lower quadrant with guarding, no masses palpated).       Non-peritoneal, no tenderness in the right upper quadrant or right lower quadrant.  Musculoskeletal: Normal range of motion. He exhibits no edema and  no tenderness.  Lymphadenopathy:    He has no cervical adenopathy.  Neurological: He is alert. Coordination normal.  Skin: Skin is warm and dry. No rash noted. No erythema.  Psychiatric: He has a normal mood and affect. His behavior is normal.    ED Course  Procedures (including critical care time)  Labs Reviewed  CBC - Abnormal; Notable for the following:    WBC 11.0 (*)    All other components within normal limits  BASIC METABOLIC PANEL - Abnormal; Notable for the following:    Glucose, Bld 125 (*)    GFR calc non Af Amer 82 (*)    All other components within normal limits  DIFFERENTIAL  URINALYSIS, ROUTINE W REFLEX MICROSCOPIC  LACTIC ACID, PLASMA  APTT  PROTIME-INR  TYPE AND SCREEN  ABO/RH   Ct Abdomen Pelvis W Contrast  10/23/2011  *RADIOLOGY REPORT*  Clinical Data: Left lower quadrant pain.  CT ABDOMEN AND PELVIS WITH CONTRAST  Technique:  Multidetector CT imaging of the abdomen and pelvis was performed following the standard protocol during bolus administration of intravenous contrast.  Contrast: OMNIPAQUE IOHEXOL 300 MG/ML IV SOLN  Comparison: None.  Findings: Dependent atelectasis in the lung bases.  No effusions. Heart is normal size.  Small gallstones seen within the gallbladder.  Mild fatty infiltration of the liver.  Spleen, pancreas, adrenals and kidneys are unremarkable.  Descending colonic and sigmoid diverticulosis.  Inflammatory changes around the sigmoid colon compatible with active diverticulitis.  No evidence of free air or abscess.  No free fluid.  Urinary bladder is unremarkable.  No acute bony abnormality.  IMPRESSION: Descending colonic and sigmoid diverticulosis. Changes of diverticulitis without abscess or free air.  Cholelithiasis.  Mild fatty infiltration of the liver.  Original Report Authenticated By: Cyndie Chime, M.D.     1. Diverticulitis       MDM  Patient appears uncomfortable, is significantly tender in the left lower quadrant and  will require imaging to rule out abscess or diverticular perforation. Currently vital signs show no signs of fever or tachycardia or hypotension. Intravenous medications including hydromorphone, normal saline infusion, n.p.o. Status  ED Course:  Patient is improved significantly with 4 mg of IV morphine  3:15 AM:  Patient reexamined and has significantly improved pain after morphine. CT scan pending  5:00 AM, patient reexamined and has ongoing left lower quadrant pain. Otherwise vital signs appear to be unremarkable   Labarotory results reviewed:  White blood cell count of 11, otherwise urinalysis and metabolic panel unremarkable.     Radiology imaging reviewed:  CT scan according to my interpretation and the radiologist's interpretation shows left lower quadrant diverticulitis without abscess or perforation   Previous Records reviewed: No prior records to review  Medications given in ED: Hydromorphone intravenous, normal saline infusion, Rocephin IV  The pt and (or) family members were informed of results and need for close follow up  Consultation: [facility] hospitalist Dr. Kaylyn Layer  Disposition:  Admit                 Vida Roller, MD 10/23/11 0600

## 2011-10-23 NOTE — H&P (Signed)
PCP:  Jerl Mina, MD, MD   DOA:  10/23/2011 12:37 AM  Chief Complaint:  Abdominal pain  HPI:  Pt is 51 year old male with a history of atrial fibrillation, hyperlipidemia who presents with progressively worsening LLQ abdominal pain that started approximately one week prior to admission. Pt reports being on Flagyl and Ciprofloxacin one month prior to admission and has finished the course of antibiotics and reports feeling better. This is new pain episode for him and it is similar to the one just mentioned. He describes pain as sharp, radiating to the epigastric area, intermittent, 7/10 in severity, worse with movement and coughing, improved with rest. Pain is associated with subjective fevers but no nausea or vomiting, no other abdominal symptoms, no urinary symptoms.  Allergies: Allergies  Allergen Reactions  . Oxycodone-Acetaminophen Itching and Rash    Prior to Admission medications   Medication Sig Start Date End Date Taking? Authorizing Provider  ciprofloxacin (CIPRO) 500 MG tablet Take 500 mg by mouth 2 (two) times daily.   Yes Historical Provider, MD  Coenzyme Q10 (COQ10) 150 MG CAPS Take 1 capsule by mouth daily.     Yes Historical Provider, MD  fish oil-omega-3 fatty acids 1000 MG capsule Take 1 g by mouth daily.     Yes Historical Provider, MD  folic acid (FOLVITE) 400 MCG tablet Take 400 mcg by mouth daily.     Yes Historical Provider, MD  metroNIDAZOLE (FLAGYL) 500 MG tablet Take 500 mg by mouth 3 (three) times daily.   Yes Historical Provider, MD  Multiple Vitamin (MULTIVITAMIN) tablet Take 1 tablet by mouth daily.     Yes Historical Provider, MD  Probiotic Product (PROBIOTIC FORMULA PO) Take 1 capsule by mouth daily.   Yes Historical Provider, MD  simvastatin (ZOCOR) 40 MG tablet Take 40 mg by mouth at bedtime.     Yes Historical Provider, MD    Past Medical History  Diagnosis Date  . Atrial fibrillation     s/p afib ablation 7/12  . Hyperlipidemia   . Bradycardia       asymptomatic  . Sleep apnea     mild    Past Surgical History  Procedure Date  . Shoulder surgery     x 2  . Back surgery   . Atrial ablation surgery 03/24/11    PVI by JA    Social History:  reports that he has never smoked. He has never used smokeless tobacco. He reports that he does not drink alcohol or use illicit drugs.  Family History  Problem Relation Age of Onset  . Atrial fibrillation Mother   . Atrial fibrillation Father   . Diabetes Father   . Heart disease Father   . Heart disease Mother     Review of Systems:  Constitutional: Denies fever, chills, diaphoresis, appetite change and fatigue.  HEENT: Denies photophobia, eye pain, redness, hearing loss, ear pain, congestion, sore throat, rhinorrhea, sneezing, mouth sores, trouble swallowing, neck pain, neck stiffness and tinnitus.   Respiratory: Denies SOB, DOE, cough, chest tightness,  and wheezing.   Cardiovascular: Denies chest pain, palpitations and leg swelling.  Gastrointestinal: per HPI Genitourinary: Denies dysuria, urgency, frequency, hematuria, flank pain and difficulty urinating.  Musculoskeletal: Denies myalgias, back pain, joint swelling, arthralgias and gait problem.  Skin: Denies pallor, rash and wound.  Neurological: Denies dizziness, seizures, syncope, weakness, light-headedness, numbness and headaches.  Hematological: Denies adenopathy. Easy bruising, personal or family bleeding history  Psychiatric/Behavioral: Denies suicidal ideation, mood changes, confusion, nervousness, sleep disturbance  and agitation   Physical Exam:  Filed Vitals:   10/23/11 0300 10/23/11 0400 10/23/11 0645 10/23/11 0750  BP: 138/71 135/73 129/70 132/79  Pulse: 56 54 61 53  Temp:    97.8 F (36.6 C)  TempSrc:    Oral  Resp:    18  SpO2: 97% 99% 92% 94%    Constitutional: Vital signs reviewed.  Patient is a well-developed and well-nourished in no acute distress and cooperative with exam. Alert and oriented x3.   Head: Normocephalic and atraumatic Ear: TM normal bilaterally Mouth: no erythema or exudates, MMM Eyes: PERRL, EOMI, conjunctivae normal, No scleral icterus.  Neck: Supple, Trachea midline normal ROM, No JVD, mass, thyromegaly, or carotid bruit present.  Cardiovascular: RRR, S1 normal, S2 normal, no MRG, pulses symmetric and intact bilaterally Pulmonary/Chest: CTAB, no wheezes, rales, or rhonchi Abdominal: Soft. Non-tender, non-distended, bowel sounds are normal, no masses, organomegaly, or guarding present.  GU: no CVA tenderness Musculoskeletal: No joint deformities, erythema, or stiffness, ROM full and no nontender Ext: no edema and no cyanosis, pulses palpable bilaterally (DP and PT) Hematology: no cervical, inginal, or axillary adenopathy.  Neurological: A&O x3, Strenght is normal and symmetric bilaterally, cranial nerve II-XII are grossly intact, no focal motor deficit, sensory intact to light touch bilaterally.  Skin: Warm, dry and intact. No rash, cyanosis, or clubbing.  Psychiatric: Normal mood and affect. speech and behavior is normal. Judgment and thought content normal. Cognition and memory are normal.   Labs on Admission:  Results for orders placed during the hospital encounter of 10/23/11 (from the past 48 hour(s))  TYPE AND SCREEN     Status: Normal   Collection Time   10/23/11  2:15 AM      Component Value Range Comment   ABO/RH(D) O POS      Antibody Screen NEG      Sample Expiration 10/26/2011     CBC     Status: Abnormal   Collection Time   10/23/11  2:23 AM      Component Value Range Comment   WBC 11.0 (*) 4.0 - 10.5 (K/uL)    RBC 4.88  4.22 - 5.81 (MIL/uL)    Hemoglobin 13.9  13.0 - 17.0 (g/dL)    HCT 16.1  09.6 - 04.5 (%)    MCV 81.8  78.0 - 100.0 (fL)    MCH 28.5  26.0 - 34.0 (pg)    MCHC 34.8  30.0 - 36.0 (g/dL)    RDW 40.9  81.1 - 91.4 (%)    Platelets 171  150 - 400 (K/uL)   DIFFERENTIAL     Status: Normal   Collection Time   10/23/11  2:23 AM       Component Value Range Comment   Neutrophils Relative 67  43 - 77 (%)    Neutro Abs 7.4  1.7 - 7.7 (K/uL)    Lymphocytes Relative 24  12 - 46 (%)    Lymphs Abs 2.7  0.7 - 4.0 (K/uL)    Monocytes Relative 7  3 - 12 (%)    Monocytes Absolute 0.8  0.1 - 1.0 (K/uL)    Eosinophils Relative 2  0 - 5 (%)    Eosinophils Absolute 0.2  0.0 - 0.7 (K/uL)    Basophils Relative 0  0 - 1 (%)    Basophils Absolute 0.0  0.0 - 0.1 (K/uL)   BASIC METABOLIC PANEL     Status: Abnormal   Collection Time   10/23/11  2:23  AM      Component Value Range Comment   Sodium 139  135 - 145 (mEq/L)    Potassium 3.5  3.5 - 5.1 (mEq/L)    Chloride 102  96 - 112 (mEq/L)    CO2 25  19 - 32 (mEq/L)    Glucose, Bld 125 (*) 70 - 99 (mg/dL)    BUN 12  6 - 23 (mg/dL)    Creatinine, Ser 1.61  0.50 - 1.35 (mg/dL)    Calcium 9.5  8.4 - 10.5 (mg/dL)    GFR calc non Af Amer 82 (*) >90 (mL/min)    GFR calc Af Amer >90  >90 (mL/min)   APTT     Status: Normal   Collection Time   10/23/11  2:23 AM      Component Value Range Comment   aPTT 28  24 - 37 (seconds)   PROTIME-INR     Status: Normal   Collection Time   10/23/11  2:23 AM      Component Value Range Comment   Prothrombin Time 14.1  11.6 - 15.2 (seconds)    INR 1.07  0.00 - 1.49    LACTIC ACID, PLASMA     Status: Normal   Collection Time   10/23/11  2:25 AM      Component Value Range Comment   Lactic Acid, Venous 1.0  0.5 - 2.2 (mmol/L)   URINALYSIS, ROUTINE W REFLEX MICROSCOPIC     Status: Normal   Collection Time   10/23/11  2:32 AM      Component Value Range Comment   Color, Urine YELLOW  YELLOW     APPearance CLEAR  CLEAR     Specific Gravity, Urine 1.022  1.005 - 1.030     pH 6.0  5.0 - 8.0     Glucose, UA NEGATIVE  NEGATIVE (mg/dL)    Hgb urine dipstick NEGATIVE  NEGATIVE     Bilirubin Urine NEGATIVE  NEGATIVE     Ketones, ur NEGATIVE  NEGATIVE (mg/dL)    Protein, ur NEGATIVE  NEGATIVE (mg/dL)    Urobilinogen, UA 0.2  0.0 - 1.0 (mg/dL)    Nitrite NEGATIVE   NEGATIVE     Leukocytes, UA NEGATIVE  NEGATIVE  MICROSCOPIC NOT DONE ON URINES WITH NEGATIVE PROTEIN, BLOOD, LEUKOCYTES, NITRITE, OR GLUCOSE <1000 mg/dL.    Radiological Exams on Admission:  CT abdomen and pelvis:   Descending colonic and sigmoid diverticulosis.   Changes of diverticulitis without abscess or free air.   Cholelithiasis.   Mild fatty infiltration of the liver.  Assessment/Plan:  Principal Problem:  *Sigmoid diverticulitis - please note CT findings above - pt is clinically stable - continue to provide supportive care with IVF, diet as tolerating - monitor vitals per floor protocol - continue antibiotics - BMP and CBC in AM  Active Problems:  Leukocytosis - secondary to diverticulitis - continue antibiotics - CBC in AM   HLD (hyperlipidemia) - stable - continue statin   HTN (hypertension) - stable - monitor vitals per floor protocol   DISPOSITION - plan of care and diagnosis, diagnostic studies and test results were discussed with pt  - pt verbalized understanding  Time Spent on Admission: Over 30 minutes  MAGICK-Jodette Wik 10/23/2011, 9:24 AM  [facility] [phone]

## 2011-10-23 NOTE — ED Notes (Signed)
Patient with history of diverticulosis, having abdominal pain, increasing in the last week.  No nausea or vomiting with this pain.

## 2011-10-23 NOTE — ED Notes (Signed)
Patient states on second round of abx having sharp abdominal pain rates it 8/10 is worse tonight since 1800. Denies N/V/D

## 2011-10-24 ENCOUNTER — Other Ambulatory Visit: Payer: Self-pay

## 2011-10-24 LAB — COMPREHENSIVE METABOLIC PANEL
ALT: 28 U/L (ref 0–53)
AST: 21 U/L (ref 0–37)
Albumin: 3.3 g/dL — ABNORMAL LOW (ref 3.5–5.2)
Calcium: 9.7 mg/dL (ref 8.4–10.5)
GFR calc Af Amer: 80 mL/min — ABNORMAL LOW (ref >90)
Sodium: 142 mEq/L (ref 135–145)
Total Protein: 6.3 g/dL (ref 6.0–8.3)

## 2011-10-24 LAB — CBC
MCH: 28.1 pg (ref 26.0–34.0)
MCHC: 34 g/dL (ref 30.0–36.0)
Platelets: 169 10*3/uL (ref 150–400)
RDW: 12.4 % (ref 11.5–15.5)

## 2011-10-24 MED ORDER — ZOLPIDEM TARTRATE 5 MG PO TABS
5.0000 mg | ORAL_TABLET | Freq: Every day | ORAL | Status: DC
  Administered 2011-10-24: 5 mg via ORAL
  Filled 2011-10-24: qty 1

## 2011-10-24 NOTE — Progress Notes (Signed)
Patient ID: ANGELINA NEECE, male   DOB: Mar 31, 1961, 51 y.o.   MRN: [id number]  Subjective: No events overnight. Patient denies chest pain, shortness of breath, abdominal pain. Had bowel movement and reports ambulating.  Objective:  Vital signs in last 24 hours:  Filed Vitals:   10/23/11 2125 10/24/11 0634 10/24/11 1028 10/24/11 1405  BP: 129/59 139/75  128/81  Pulse: 50 43 49 55  Temp: 97.8 F (36.6 C) 97.6 F (36.4 C)  98 F (36.7 C)  TempSrc: Oral   Oral  Resp: 18 17  18   Height:      Weight:      SpO2: 96% 98%  99%    Intake/Output from previous day:   Intake/Output Summary (Last 24 hours) at 10/24/11 2038 Last data filed at 10/24/11 1400  Gross per 24 hour  Intake    960 ml  Output    800 ml  Net    160 ml    Physical Exam: General: Alert, awake, oriented x3, in no acute distress. HEENT: No bruits, no goiter. Moist mucous membranes, no scleral icterus, no conjunctival pallor. Heart: Regular rate and rhythm, S1/S2 +, no murmurs, rubs, gallops. Lungs: Clear to auscultation bilaterally. No wheezing, no rhonchi, no rales.  Abdomen: Soft, nontender, nondistended, positive bowel sounds. Extremities: No clubbing or cyanosis, no pitting edema,  positive pedal pulses. Neuro: Grossly nonfocal.  Lab Results:  Basic Metabolic Panel:    Component Value Date/Time   NA 142 10/24/2011 0613   K 4.6 10/24/2011 0613   CL 107 10/24/2011 0613   CO2 29 10/24/2011 0613   BUN 13 10/24/2011 0613   CREATININE 1.18 10/24/2011 0613   GLUCOSE 127* 10/24/2011 0613   CALCIUM 9.7 10/24/2011 0613   CBC:    Component Value Date/Time   WBC 7.5 10/24/2011 0613   HGB 13.6 10/24/2011 0613   HCT 40.0 10/24/2011 0613   PLT 169 10/24/2011 0613   MCV 82.6 10/24/2011 0613   NEUTROABS 7.4 10/23/2011 0223   LYMPHSABS 2.7 10/23/2011 0223   MONOABS 0.8 10/23/2011 0223   EOSABS 0.2 10/23/2011 0223   BASOSABS 0.0 10/23/2011 0223      Lab 10/24/11 0613 10/23/11 0223  WBC 7.5 11.0*  HGB 13.6 13.9  HCT  40.0 39.9  PLT 169 171  MCV 82.6 81.8  MCH 28.1 28.5  MCHC 34.0 34.8  RDW 12.4 12.4  LYMPHSABS -- 2.7  MONOABS -- 0.8  EOSABS -- 0.2  BASOSABS -- 0.0  BANDABS -- --    Lab 10/24/11 0613 10/23/11 1205 10/23/11 0223  NA 142 -- 139  K 4.6 -- 3.5  CL 107 -- 102  CO2 29 -- 25  GLUCOSE 127* -- 125*  BUN 13 -- 12  CREATININE 1.18 -- 1.03  CALCIUM 9.7 -- 9.5  MG -- 2.0 --    Lab 10/23/11 0223  INR 1.07  PROTIME --   Cardiac markers: No results found for this basename: CK:3,CKMB:3,TROPONINI:3,MYOGLOBIN:3 in the last 168 hours No components found with this basename: POCBNP:3 No results found for this or any previous visit (from the past 240 hour(s)).  Studies/Results: Ct Abdomen Pelvis W Contrast  10/23/2011  *RADIOLOGY REPORT*  Clinical Data: Left lower quadrant pain.  CT ABDOMEN AND PELVIS WITH CONTRAST  Technique:  Multidetector CT imaging of the abdomen and pelvis was performed following the standard protocol during bolus administration of intravenous contrast.  Contrast: OMNIPAQUE IOHEXOL 300 MG/ML IV SOLN  Comparison: None.  Findings: Dependent atelectasis in  the lung bases.  No effusions. Heart is normal size.  Small gallstones seen within the gallbladder.  Mild fatty infiltration of the liver.  Spleen, pancreas, adrenals and kidneys are unremarkable.  Descending colonic and sigmoid diverticulosis.  Inflammatory changes around the sigmoid colon compatible with active diverticulitis.  No evidence of free air or abscess.  No free fluid.  Urinary bladder is unremarkable.  No acute bony abnormality.  IMPRESSION: Descending colonic and sigmoid diverticulosis. Changes of diverticulitis without abscess or free air.  Cholelithiasis.  Mild fatty infiltration of the liver.  Original Report Authenticated By: Cyndie Chime, M.D.    Medications: Scheduled Meds:   . ciprofloxacin  400 mg Intravenous Q12H  . enoxaparin  40 mg Subcutaneous Q24H  . folic acid  0.5 mg Oral Daily  .  metronidazole  500 mg Intravenous Q8H  . simvastatin  40 mg Oral QHS   Continuous Infusions:   . sodium chloride 75 mL/hr at 10/23/11 2054   PRN Meds:.diphenhydrAMINE, HYDROmorphone, ondansetron (ZOFRAN) IV, ondansetron  Assessment/Plan: Principal Problem:  *Sigmoid diverticulitis  - please note CT findings above  - pt is clinically stable  - continue to provide supportive care with IVF, diet as tolerating  - monitor vitals per floor protocol  - continue antibiotics  - BMP and CBC in AM   Active Problems:  Leukocytosis  - secondary to diverticulitis  - continue antibiotics  - CBC in AM   HLD (hyperlipidemia)  - stable  - continue statin   HTN (hypertension)  - stable  - monitor vitals per floor protocol   DISPOSITION  - plan of care and diagnosis, diagnostic studies and test results were discussed with pt  - pt verbalized understanding    LOS: 1 day   MAGICK-Roy Snuffer 10/24/2011, 8:38 PM  [facility] Pager: [phone]

## 2011-10-25 ENCOUNTER — Encounter: Payer: Self-pay | Admitting: Internal Medicine

## 2011-10-25 LAB — BASIC METABOLIC PANEL
CO2: 28 mEq/L (ref 19–32)
Calcium: 9.8 mg/dL (ref 8.4–10.5)
Chloride: 107 mEq/L (ref 96–112)
Glucose, Bld: 135 mg/dL — ABNORMAL HIGH (ref 70–99)
Potassium: 4.2 mEq/L (ref 3.5–5.1)
Sodium: 141 mEq/L (ref 135–145)

## 2011-10-25 LAB — CBC
Hemoglobin: 13.6 g/dL (ref 13.0–17.0)
MCH: 27.4 pg (ref 26.0–34.0)
Platelets: 172 10*3/uL (ref 150–400)
RBC: 4.97 MIL/uL (ref 4.22–5.81)
WBC: 6.9 10*3/uL (ref 4.0–10.5)

## 2011-10-25 MED ORDER — CIPROFLOXACIN HCL 500 MG PO TABS: 500.0000 mg | ORAL_TABLET | Freq: Two times a day (BID) | ORAL | Status: DC

## 2011-10-25 MED ORDER — METRONIDAZOLE 500 MG PO TABS: 500.0000 mg | ORAL_TABLET | Freq: Three times a day (TID) | ORAL | Status: DC

## 2011-10-25 MED ORDER — DIPHENHYDRAMINE HCL 25 MG PO CAPS: 25.0000 mg | ORAL_CAPSULE | Freq: Every evening | ORAL | Status: DC | PRN

## 2011-10-25 MED ORDER — ZOLPIDEM TARTRATE 5 MG PO TABS: 5.0000 mg | ORAL_TABLET | Freq: Every day | ORAL | Status: DC

## 2011-10-25 NOTE — Progress Notes (Signed)
Utilization Review Completed.Teretha Chalupa T2/25/2013   

## 2011-10-25 NOTE — Discharge Summary (Signed)
Patient ID: Jorge Peterson MRN: [id number] DOB/AGE: [birth year] 51 y.o.  Admit date: 10/23/2011 Discharge date: 10/25/2011  Primary Care Physician:  Jerl Mina, MD, MD  Discharge Diagnoses:    Present on Admission:  .Sigmoid diverticulitis .Leukocytosis .HLD (hyperlipidemia) .HTN (hypertension)  Principal Problem:  *Sigmoid diverticulitis Active Problems:  Leukocytosis  HLD (hyperlipidemia)  HTN (hypertension)   Medication List  As of 10/25/2011  7:16 AM   ASK your doctor about these medications         ciprofloxacin 500 MG tablet   Commonly known as: CIPRO   Take 500 mg by mouth 2 (two) times daily.      COQ10 150 MG Caps   Take 1 capsule by mouth daily.      fish oil-omega-3 fatty acids 1000 MG capsule   Take 1 g by mouth daily.      folic acid 400 MCG tablet   Commonly known as: FOLVITE   Take 400 mcg by mouth daily.      metroNIDAZOLE 500 MG tablet   Commonly known as: FLAGYL   Take 500 mg by mouth 3 (three) times daily.      multivitamin tablet   Take 1 tablet by mouth daily.      PROBIOTIC FORMULA PO   Take 1 capsule by mouth daily.      simvastatin 40 MG tablet   Commonly known as: ZOCOR   Take 40 mg by mouth at bedtime.            Disposition and Follow-up: With GI in 10 days and with PCP in 4 weeks  Consults: none  Significant Diagnostic Studies:  Ct Abdomen Pelvis W Contrast  10/23/2011  *RADIOLOGY REPORT*  Clinical Data: Left lower quadrant pain.  CT ABDOMEN AND PELVIS WITH CONTRAST  Technique:  Multidetector CT imaging of the abdomen and pelvis was performed following the standard protocol during bolus administration of intravenous contrast.  Contrast: OMNIPAQUE IOHEXOL 300 MG/ML IV SOLN  Comparison: None.  Findings: Dependent atelectasis in the lung bases.  No effusions. Heart is normal size.  Small gallstones seen within the gallbladder.  Mild fatty infiltration of the liver.  Spleen, pancreas, adrenals and kidneys are  unremarkable.  Descending colonic and sigmoid diverticulosis.  Inflammatory changes around the sigmoid colon compatible with active diverticulitis.  No evidence of free air or abscess.  No free fluid.  Urinary bladder is unremarkable.  No acute bony abnormality.  IMPRESSION: Descending colonic and sigmoid diverticulosis. Changes of diverticulitis without abscess or free air.  Cholelithiasis.  Mild fatty infiltration of the liver.  Original Report Authenticated By: Cyndie Chime, M.D.    Brief H and P: Pt is 51 year old male with a history of atrial fibrillation, hyperlipidemia who presents with progressively worsening LLQ abdominal pain that started approximately one week prior to admission. Pt reports being on Flagyl and Ciprofloxacin one month prior to admission and has finished the course of antibiotics and reports feeling better. This is new pain episode for him and it is similar to the one just mentioned. He describes pain as sharp, radiating to the epigastric area, intermittent, 7/10 in severity, worse with movement and coughing, improved with rest. Pain is associated with subjective fevers but no nausea or vomiting, no other abdominal symptoms, no urinary symptoms.   Physical Exam on Discharge:  Filed Vitals:   10/24/11 1028 10/24/11 1405 10/24/11 2115 10/25/11 0640  BP:  128/81 158/81 152/80  Pulse: 49 55 53 50  Temp:  98 F (  36.7 C) 98.2 F (36.8 C) 97.6 F (36.4 C)  TempSrc:  Oral Oral   Resp:  18 18 18   Height:      Weight:      SpO2:  99% 95% 99%     Intake/Output Summary (Last 24 hours) at 10/25/11 0716 Last data filed at 10/24/11 1400  Gross per 24 hour  Intake    960 ml  Output      0 ml  Net    960 ml    General: Alert, awake, oriented x3, in no acute distress. HEENT: No bruits, no goiter. Heart: Regular rate and rhythm, without murmurs, rubs, gallops. Lungs: Clear to auscultation bilaterally. Abdomen: Soft, nontender, nondistended, positive bowel  sounds. Extremities: No clubbing cyanosis or edema with positive pedal pulses. Neuro: Grossly intact, nonfocal.  CBC:    Component Value Date/Time   WBC 6.9 10/25/2011 0515   HGB 13.6 10/25/2011 0515   HCT 40.9 10/25/2011 0515   PLT 172 10/25/2011 0515   MCV 82.3 10/25/2011 0515   NEUTROABS 7.4 10/23/2011 0223   LYMPHSABS 2.7 10/23/2011 0223   MONOABS 0.8 10/23/2011 0223   EOSABS 0.2 10/23/2011 0223   BASOSABS 0.0 10/23/2011 0223    Basic Metabolic Panel:    Component Value Date/Time   NA 141 10/25/2011 0515   K 4.2 10/25/2011 0515   CL 107 10/25/2011 0515   CO2 28 10/25/2011 0515   BUN 13 10/25/2011 0515   CREATININE 1.11 10/25/2011 0515   GLUCOSE 135* 10/25/2011 0515   CALCIUM 9.8 10/25/2011 0515    Hospital Course:  Principal Problem:  *Sigmoid diverticulitis - pt has responded well to antibiotics, IVF, analgesia and antiemetics - he was discharged in stable condition and will need to follow up with GI in 10 days for further evaluation and management  Active Problems:  Leukocytosis - secondary to primary problem - resolved   HLD (hyperlipidemia) - stable   HTN (hypertension) - stable   Time spent on Discharge: Over 30 minutes  Signed: Debbora Presto 10/25/2011, 7:16 AM  [phone]

## 2011-10-25 NOTE — Progress Notes (Signed)
Discharge instructions reviewed with pt and pt's wife and prescriptions given.  Pt and pt's wife verbalized understanding and had no questions.  Pt discharged in stable condition with wife.  Hector Shade [place]

## 2011-11-01 ENCOUNTER — Telehealth: Payer: Self-pay | Admitting: Internal Medicine

## 2011-11-01 DIAGNOSIS — I10 Essential (primary) hypertension: Secondary | ICD-10-CM

## 2011-11-01 NOTE — Telephone Encounter (Signed)
Pt wife calling stating that pt BP is running really high and c/o HA. Pt doesn't know if they need to be seen or if Dr Johney Frame can prescribe BP meds for pt

## 2011-11-02 MED ORDER — LOSARTAN POTASSIUM 50 MG PO TABS: 50.0000 mg | ORAL_TABLET | Freq: Every day | ORAL | Status: DC

## 2011-11-02 NOTE — Telephone Encounter (Signed)
ARB data that shows this worked well with people whom have had ablations(afib) and HTN Losartan 50mg  one daily BMP one week

## 2011-11-02 NOTE — Telephone Encounter (Signed)
BP 150/110 and HA's associated with it.  He was in the hospital for Diverticulitis 2 weeks ago.  Since he got out of hospital has noticed it has been going up.  Ears and face are flushed, his head in frontal area has a mild ache(not bad)  He has an appointment with his PCP Dr Hedrick's nurse tomorrow to assess

## 2011-11-03 ENCOUNTER — Encounter: Payer: Self-pay | Admitting: Internal Medicine

## 2011-11-03 NOTE — Telephone Encounter (Signed)
Pt aware and medication called in

## 2011-11-05 ENCOUNTER — Encounter: Payer: Self-pay | Admitting: Internal Medicine

## 2011-11-05 ENCOUNTER — Ambulatory Visit (INDEPENDENT_AMBULATORY_CARE_PROVIDER_SITE_OTHER): Payer: PRIVATE HEALTH INSURANCE | Admitting: Internal Medicine

## 2011-11-05 ENCOUNTER — Telehealth: Payer: Self-pay | Admitting: Internal Medicine

## 2011-11-05 DIAGNOSIS — K5792 Diverticulitis of intestine, part unspecified, without perforation or abscess without bleeding: Secondary | ICD-10-CM

## 2011-11-05 DIAGNOSIS — Z86010 Personal history of colonic polyps: Secondary | ICD-10-CM | POA: Insufficient documentation

## 2011-11-05 DIAGNOSIS — K5732 Diverticulitis of large intestine without perforation or abscess without bleeding: Secondary | ICD-10-CM

## 2011-11-05 DIAGNOSIS — Z860101 Personal history of adenomatous and serrated colon polyps: Secondary | ICD-10-CM | POA: Insufficient documentation

## 2011-11-05 NOTE — Patient Instructions (Addendum)
Continue taking Cipro & Flagyl call us in about a week to let us know if you are feeling better.  Follow up with Dr. Rhea Belton in 1 month.

## 2011-11-05 NOTE — Telephone Encounter (Signed)
Received 9 pages, sent to Dr. Rhea Belton. SD 11/05/11.

## 2011-11-05 NOTE — Progress Notes (Addendum)
Subjective:    Patient ID: Jorge Peterson, male    DOB: November 27, 1960, 51 y.o.   MRN: [id number]  HPI Jorge Peterson is a 51 year old male with a past medical history of atrial fibrillation status post ablation in June of 2012, hyperlipidemia, mild sleep apnea, and recent diverticulitis who presents for evaluation of diverticulitis. The patient is accompanied today by his wife. The patient states over the last 4-6 weeks he has been dealing with left lower quadrant pain, and diverticulitis as diagnosed clinically and by CT scan of the abdomen and pelvis. Initially his symptoms started as a lower abdominal pain, later localizing to the left lower quadrant. Initially this was treated with ciprofloxacin and metronidazole for 10 days. He reports his initial response was very good and the pain resolved completely. Then a few days after completion of antibiotic therapy, his symptoms returned. He was then restarted on ciprofloxacin and metronidazole, and during this course the pain worsened intensely requiring hospitalization. During this hospitalization CT scan revealed left-sided diverticulosis with sigmoid diverticulitis. He was again treated with IV ciprofloxacin and metronidazole for 3 days in the hospital, and is discharged on the same antibiotics. He remained on these antibiotics today and reports he has about 3 days remaining.  At present, he reports he is significantly better though still has mild left lower corner pain along with loose stool. He is not having diarrhea. He is not having any bleeding or melena. His appetite is not the same as it was before this infection, but he is able to eat without nausea or vomiting. He does report a bad taste in his mouth. No issues with heartburn or swallowing. No fevers or chills, though his wife notes at times he feels very cold and other times he feels flushed.  Review of Systems As per history of present illness, otherwise negative   Past Medical History    Diagnosis Date  . Atrial fibrillation     s/p afib ablation 7/12  . Hyperlipidemia   . Bradycardia     asymptomatic  . Sleep apnea     mild  . Diverticulitis    Past Surgical History  Procedure Date  . Shoulder surgery     x 2  . Back surgery   . Atrial ablation surgery 03/24/11    PVI by JA  . Fistula surgery    Current Outpatient Prescriptions  Medication Sig Dispense Refill  . ciprofloxacin (CIPRO) 500 MG tablet Take 1 tablet (500 mg total) by mouth 2 (two) times daily.  25 tablet  0  . Coenzyme Q10 (COQ10) 150 MG CAPS Take 1 capsule by mouth daily.        . fish oil-omega-3 fatty acids 1000 MG capsule Take 1 g by mouth daily.        . folic acid (FOLVITE) 400 MCG tablet Take 400 mcg by mouth daily.        [name] losartan (COZAAR) 50 MG tablet Take 1 tablet (50 mg total) by mouth daily.  30 tablet  11  . metroNIDAZOLE (FLAGYL) 500 MG tablet Take 1 tablet (500 mg total) by mouth 3 (three) times daily.  37 tablet  0  . Multiple Vitamin (MULTIVITAMIN) tablet Take 1 tablet by mouth daily.        . Probiotic Product (PROBIOTIC FORMULA PO) Take 1 capsule by mouth daily.      . simvastatin (ZOCOR) 40 MG tablet Take 40 mg by mouth at bedtime.        [name]  zolpidem (AMBIEN) 5 MG tablet Take 1 tablet (5 mg total) by mouth at bedtime.  30 tablet  1  . diphenhydrAMINE (BENADRYL) 25 mg capsule Take 1 capsule (25 mg total) by mouth at bedtime as needed for itching or sleep.  30 capsule  1   Allergies  Allergen Reactions  . Oxycodone-Acetaminophen Itching and Rash   Family History  Problem Relation Age of Onset  . Diabetes Father   . Heart disease Father    Social History  . Marital Status: Married    Spouse Name: Edwena Felty Kempker    Number of Children: 3   Occupational History  . [employer]    Social History Main Topics  . Smoking status: Never Smoker   . Smokeless tobacco: Never Used  . Alcohol Use: No  . Drug Use: No   Social History Narrative   Lives in  [place].  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      Objective:   Physical Exam BP 130/78  Pulse 76  Ht 5\' 7"  (1.702 m)  Wt 221 lb 6.4 oz (100.426 kg)  BMI 34.68 kg/m2 Constitutional: Well-developed and well-nourished. No distress. HEENT: Normocephalic and atraumatic. Oropharynx is clear and moist. No oropharyngeal exudate. Conjunctivae are normal. Pupils are equal round and reactive to light. No scleral icterus. Neck: Neck supple. Trachea midline. Cardiovascular: Bradycardic, regular rhythm and intact distal pulses. No M/R/G Pulmonary/chest: Effort normal and breath sounds normal. No wheezing, rales or rhonchi. Abdominal: Soft, mild tenderness to deep palpation in the left lower quadrant without rebound or guarding, nondistended. Bowel sounds active throughout. There are no masses palpable. No hepatosplenomegaly. Extremities: no clubbing, cyanosis, or edema Lymphadenopathy: No cervical adenopathy noted. Neurological: Alert and oriented to person place and time. Skin: Skin is warm and dry. No rashes noted. Psychiatric: Normal mood and affect. Behavior is normal.  CBC    Component Value Date/Time   WBC 6.9 10/25/2011 0515   RBC 4.97 10/25/2011 0515   HGB 13.6 10/25/2011 0515   HCT 40.9 10/25/2011 0515   PLT 172 10/25/2011 0515   MCV 82.3 10/25/2011 0515   MCH 27.4 10/25/2011 0515   MCHC 33.3 10/25/2011 0515   RDW 12.3 10/25/2011 0515   LYMPHSABS 2.7 10/23/2011 0223   MONOABS 0.8 10/23/2011 0223   EOSABS 0.2 10/23/2011 0223   BASOSABS 0.0 10/23/2011 0223    CMP     Component Value Date/Time   NA 141 10/25/2011 0515   K 4.2 10/25/2011 0515   CL 107 10/25/2011 0515   CO2 28 10/25/2011 0515   GLUCOSE 135* 10/25/2011 0515   BUN 13 10/25/2011 0515   CREATININE 1.11 10/25/2011 0515   CALCIUM 9.8 10/25/2011 0515   PROT 6.3 10/24/2011 0613   ALBUMIN 3.3* 10/24/2011 0613   AST 21 10/24/2011 0613   ALT 28 10/24/2011 0613   ALKPHOS 101 10/24/2011 0613    BILITOT 0.3 10/24/2011 0613   GFRNONAA 75* 10/25/2011 0515   GFRAA 87* 10/25/2011 0515   Imaging reviewed, dated 10/23/2011 Clinical Data: Left lower quadrant pain.   CT ABDOMEN AND PELVIS WITH CONTRAST   Technique:  Multidetector CT imaging of the abdomen and pelvis was performed following the standard protocol during bolus administration of intravenous contrast.   Contrast: OMNIPAQUE IOHEXOL 300 MG/ML IV SOLN   Comparison: None.   Findings: Dependent atelectasis in the lung bases.  No effusions. Heart is normal size.   Small gallstones seen within the  gallbladder.  Mild fatty infiltration of the liver.  Spleen, pancreas, adrenals and kidneys are unremarkable.   Descending colonic and sigmoid diverticulosis.  Inflammatory changes around the sigmoid colon compatible with active diverticulitis.  No evidence of free air or abscess.  No free fluid.  Urinary bladder is unremarkable.   No acute bony abnormality.   IMPRESSION: Descending colonic and sigmoid diverticulosis. Changes of diverticulitis without abscess or free air.   Cholelithiasis.  Mild fatty infiltration of the liver.     Assessment & Plan:  51 year old male with a past medical history of atrial fibrillation status post ablation in June of 2012, hyperlipidemia, mild sleep apnea, and recent diverticulitis who presents for evaluation of diverticulitis.  1. Diverticulitis -- we spent time today discussing diverticulosis and diverticulitis along the natural history of the disease.  It seems that he's had a smoldering course of inflammation over the last 4 weeks, and he has been treated with ciprofloxacin and metronidazole for nearly this entire time.  He feels "85-90%" better, that he is still having some mild pain and loose stool.  It is reassuring he recently had colonoscopy in December 2012, which reportedly revealed a non-precancerous polyp. He reports he had a larger polyp removed 3 years before this recent  colonoscopy.  At this point, I recommended that he complete therapy with ciprofloxacin and metronidazole, and then we will closely watch his symptoms for any recurrence or ongoing issues.  If he has yet another recurrence, that we need to definitely switch antibiotics. He has been on ciprofloxacin and metronidazole 4 weeks now, and if this infection is ongoing, there is likely some bacterial resistance at play.  He will complete antibody to early next week, and call my office if he worsens or does not fully improve back to normal for him. I will see him in 4 weeks' time for reassessment unless I hear from him sooner.  My next antibiotic choice would likely be Augmentin.  2. history of adenomatous colon polyps -- Will obtain records from his prior colonoscopies to determine when he is due for surveillance.  Addendum:  Records received regarding previous colonoscopies  Colonoscopy, 08/13/2011 --Exam to the cecum with good prep. Findings: One 2 cm tubular adenoma in the proximal ascending colon. Diverticulosis in the sigmoid and descending colon. Nonbleeding internal hemorrhoids.  Colonoscopy, 07/30/2008 --Exam to the cecum with a good prep. Findings: A 16 mm bleeding polyp in the distal sigmoid colon. Resected and retrieved. Diverticulosis left colon. Pathology: Villous adenoma, negative for high-grade dysplasia  --Based on these findings, guidelines support repeat colonoscopy December 2017

## 2011-11-18 ENCOUNTER — Telehealth: Payer: Self-pay | Admitting: *Deleted

## 2011-11-18 NOTE — Telephone Encounter (Signed)
Jorge Peterson is getting over diverticulitis, prolonged symptoms. Would you please call him to check on his symptoms and make sure they have completely resolved. Also, he is to be placed on the recall for colonoscopy December 2017  Spoke with pt who reports he's doing a lot better. He denies pain, fever or melena and states his stools are normal. Recall made for 07/2016.

## 2011-11-23 ENCOUNTER — Telehealth: Payer: Self-pay | Admitting: Internal Medicine

## 2011-11-23 MED ORDER — AMOXICILLIN-POT CLAVULANATE 875-125 MG PO TABS: 1.0000 | ORAL_TABLET | Freq: Two times a day (BID) | ORAL | Status: AC

## 2011-11-23 NOTE — Telephone Encounter (Signed)
Pt reports he developed sharp abdominal pain last pm below his navel and to the left- just like before when he had diverticulitis. He is constipated and denies diarrhea. Per Dr Rhea Belton, order Augmentin. Notified pt who stated understanding.

## 2011-11-24 ENCOUNTER — Telehealth: Payer: Self-pay | Admitting: Internal Medicine

## 2011-11-24 NOTE — Telephone Encounter (Signed)
Please return call to patient wife Synetta Fail [phone]  Patient is in A-Fib again, has been sick and complaining of being cold all the time.  Patient wife is very worried and wondered if he could be seen ASAP, as patient is not taking any meds at the present time.  Patient is currently scheduled to see Allred on 4/17.  Please return call to wife at [phone].

## 2011-11-24 NOTE — Telephone Encounter (Signed)
Has had bouts of diverticulitis and he is going in and out of afib  Not in afib now.  He has been complaining of a horseness and being cold a lot.  When he goes out he stays out for 1/2 day.  Health has changed since 08/2011 with his first bout of diverticulitis.  Will discuss with Dr Johney Frame and call them back as he is not out of rhythm now do not feel necessary to add on tomorrow

## 2011-11-24 NOTE — Telephone Encounter (Signed)
Pt's wife reports pt has taken 2 doses of Augmentin and is really "dragging".  He also c/o being bloated and he's been hoarse since the 1st bout of diverticulitis in January, 2013. He stays cold all the time also and sometimes has to wrap up in a blanket in the daytime to get warm. Also, what diet should pt be on? Informed wife per Dr Rhea Belton, pt should be on a low fiber or low residue diet. Since pt has a hx of AFIB, he should have a check up by his PCP or Cardiologist; she states he in and out of fib and that might be why he's cold as well. Dr Rhea Belton knows of no reason why pt should be hoarse; suggests he f/u with his PCP.

## 2011-11-25 NOTE — Telephone Encounter (Signed)
lmom for wife that Dr Johney Frame does not feel he needs to be seen urgently and to keep his appointment on 12/15/11  He also says that the afib can be expected with him having bouts of diverticulitis  They can call his PCP to evaluate further or call me if needed

## 2011-12-02 ENCOUNTER — Encounter: Payer: Self-pay | Admitting: Internal Medicine

## 2011-12-06 ENCOUNTER — Ambulatory Visit (INDEPENDENT_AMBULATORY_CARE_PROVIDER_SITE_OTHER): Payer: PRIVATE HEALTH INSURANCE | Admitting: Internal Medicine

## 2011-12-06 ENCOUNTER — Encounter: Payer: Self-pay | Admitting: Internal Medicine

## 2011-12-06 VITALS — BP 112/78 | HR 60 | Ht 67.0 in | Wt 223.0 lb

## 2011-12-06 DIAGNOSIS — K5730 Diverticulosis of large intestine without perforation or abscess without bleeding: Secondary | ICD-10-CM

## 2011-12-06 DIAGNOSIS — Z860100 Personal history of colon polyps, unspecified: Secondary | ICD-10-CM

## 2011-12-06 DIAGNOSIS — Z8719 Personal history of other diseases of the digestive system: Secondary | ICD-10-CM

## 2011-12-06 NOTE — Progress Notes (Signed)
Subjective:    Patient ID: Jorge Peterson, male    DOB: [birth year], 51 y.o.   MRN: [id number]  HPI Mr. Delisle is a 51 year old male with a past medical history of atrial fibrillation status post ablation in June of 2012, hyperlipidemia, mild sleep apnea, and recent diverticulitis who presents for follow-up.  The patient was last seen on 11/05/2011 and at that time had been recently hospitalized for diverticulitis, which was treated with ciprofloxacin and metronidazole. He was discharged on those 2 medications, and was still only oral regimen at followup. At that time I was somewhat concerned that this medication may not be fully effective for him and the possibility of resistant organisms was entertained. This was based on the fact that his course and symptoms of diverticulitis had lasted for 6 weeks or more. At that time, we decided for him to complete or regimen and he was on, and he was to call us if his symptoms do not fully resolve or recur. He did call the office, approximately 2 weeks after completing oral therapy with low-grade but persistent left lower quadrant pain and overall fatigue. At that time the decision was made to treat him with a different antibiotic, specifically Augmentin twice a day for 10 days. He did complete that therapy and has now had complete resolution of his symptoms. He reports no further abdominal pain, no fatigue. He's had no fevers or chills. His bowel habits have returned to normal. No nausea or vomiting. Good appetite. No weight loss. He is back to a regular diet, and he continues to work out routinely at the [facility] without difficulty.  Review of Systems As per history of present illness, otherwise negative  Current Medications, Allergies, Past Medical History, Past Surgical History, Family History and Social History were reviewed in [employer] record.     Objective:   Physical Exam BP 112/78  Pulse 60  Ht 5\' 7"  (1.702 m)  Wt 223 lb  (101.152 kg)  BMI 34.93 kg/m2 Constitutional: Well-developed and well-nourished. No distress. HEENT: Normocephalic and atraumatic. Oropharynx is clear and moist. No oropharyngeal exudate. Conjunctivae are normal. Pupils are equal round and reactive to light. No scleral icterus. Neck: Neck supple. Trachea midline. Cardiovascular: Normal rate, regular rhythm and intact distal pulses. No M/R/G Pulmonary/chest: Effort normal and breath sounds normal. No wheezing, rales or rhonchi. Abdominal: Soft, nontender, nondistended. Bowel sounds active throughout. There are no masses palpable. No hepatosplenomegaly. Extremities: no clubbing, cyanosis, or edema Lymphadenopathy: No cervical adenopathy noted. Neurological: Alert and oriented to person place and time. Skin: Skin is warm and dry. No rashes noted. Psychiatric: Normal mood and affect. Behavior is normal.     Assessment & Plan:  51 year old male with a past medical history of atrial fibrillation status post ablation in June of 2012, hyperlipidemia, mild sleep apnea, and recent diverticulitis who presents for follow-up  1. Diverticulitis -- now resolved. He did respond very favorably to amoxicillin/clavulanic acid. He is now asymptomatic. We have talked about recurrence, and he will let us know if he starts to develop the early signs and symptoms of recurrent diverticulitis. Her now have recommended a regular diet going forward.  2. History of adenomatous colon polyps -- we did receive records of his prior 2 colonoscopies. The last was in December 2012 and he had one 2 cm tubular adenoma removed. Based on current guidelines, repeat surveillance colonoscopy is recommended in December 2017. He would like to have this done here, and recall reminder will be  placed.  F/u PRN

## 2011-12-06 NOTE — Patient Instructions (Signed)
Follow up with Dr. Pyrtle as needed  

## 2011-12-15 ENCOUNTER — Telehealth: Payer: Self-pay

## 2011-12-15 ENCOUNTER — Ambulatory Visit (INDEPENDENT_AMBULATORY_CARE_PROVIDER_SITE_OTHER): Payer: PRIVATE HEALTH INSURANCE | Admitting: Internal Medicine

## 2011-12-15 ENCOUNTER — Encounter: Payer: Self-pay | Admitting: Internal Medicine

## 2011-12-15 VITALS — BP 128/82 | HR 46 | Resp 18 | Ht 67.0 in | Wt 223.0 lb

## 2011-12-15 DIAGNOSIS — I10 Essential (primary) hypertension: Secondary | ICD-10-CM

## 2011-12-15 DIAGNOSIS — I498 Other specified cardiac arrhythmias: Secondary | ICD-10-CM

## 2011-12-15 DIAGNOSIS — R001 Bradycardia, unspecified: Secondary | ICD-10-CM

## 2011-12-15 DIAGNOSIS — I4891 Unspecified atrial fibrillation: Secondary | ICD-10-CM

## 2011-12-15 NOTE — Telephone Encounter (Signed)
Monitor will be mail to patient. 

## 2011-12-15 NOTE — Assessment & Plan Note (Signed)
Stable No changes 

## 2011-12-15 NOTE — Patient Instructions (Addendum)
Your physician recommends that you schedule a follow-up appointment in: 3 months with Dr. Johney Frame.  Your physician has recommended that you wear a 30 day monitor. These monitors are medical devices that record the heart's electrical activity. Doctors most often use these monitors to diagnose arrhythmias. Arrhythmias are problems with the speed or rhythm of the heartbeat. The monitor is a small, portable device. You can wear one while you do your normal daily activities. This is usually used to diagnose what is causing palpitations/syncope (passing out).

## 2011-12-15 NOTE — Assessment & Plan Note (Signed)
Stable No change required today  

## 2011-12-15 NOTE — Progress Notes (Signed)
PCP:  Dr Jerl Mina  The patient presents today for routine electrophysiology followup. He continues to have stable bradycardia with HR 40s/50s for which he remains asymptomatic.  He developed diverticulitis several months ago and has had a long recovery from this.  He thinks that he may have had afib with this.  He reports occasional episodes of short palpitations and a "cold sensation".  He denies tachypalpitations or sustained palpitations. Today, he denies symptoms of chest pain, shortness of breath, orthopnea, PND, lower extremity edema, dizziness, presyncope, syncope, or neurologic sequela.  The patient feels that he is tolerating medications without difficulties and is otherwise without complaint today.   Past Medical History  Diagnosis Date  . Atrial fibrillation     s/p afib ablation 7/12  . Hyperlipidemia   . Bradycardia     asymptomatic  . Sleep apnea     mild  . Diverticulitis    Past Surgical History  Procedure Date  . Shoulder surgery     x 2  . Back surgery   . Atrial ablation surgery 03/24/11    PVI by JA  . Fistula surgery     Current Outpatient Prescriptions  Medication Sig Dispense Refill  . Coenzyme Q10 (COQ10) 150 MG CAPS Take 1 capsule by mouth daily.        . fish oil-omega-3 fatty acids 1000 MG capsule Take 1 g by mouth daily.        . folic acid (FOLVITE) 400 MCG tablet Take 400 mcg by mouth daily.        [name] losartan (COZAAR) 50 MG tablet Take 1 tablet (50 mg total) by mouth daily.  30 tablet  11  . Multiple Vitamin (MULTIVITAMIN) tablet Take 1 tablet by mouth daily.        . Probiotic Product (PROBIOTIC FORMULA PO) Take 1 capsule by mouth daily.      . simvastatin (ZOCOR) 40 MG tablet Take 40 mg by mouth at bedtime.        [name] zolpidem (AMBIEN) 5 MG tablet Take 1 tablet (5 mg total) by mouth at bedtime.  30 tablet  1  . diphenhydrAMINE (BENADRYL) 25 mg capsule Take 1 capsule (25 mg total) by mouth at bedtime as needed for itching or sleep.  30 capsule  1     Allergies  Allergen Reactions  . Oxycodone-Acetaminophen Itching and Rash    History   Social History  . Marital Status: Married    Spouse Name: N/A    Number of Children: 3  . Years of Education: N/A   Occupational History  . [employer]    Social History Main Topics  . Smoking status: Never Smoker   . Smokeless tobacco: Never Used  . Alcohol Use: No  . Drug Use: No  . Sexually Active:    Other Topics Concern  . Not on file   Social History Narrative   Lives in [place].  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    Family History  Problem Relation Age of Onset  . Diabetes Father   . Heart disease Father     Physical Exam: Filed Vitals:   12/15/11 1032  BP: 128/82  Pulse: 46  Resp: 18  Height: 5\' 7"  (1.702 m)  Weight: 223 lb (101.152 kg)    GEN- The patient is well appearing, alert and oriented x 3 today.   Head- normocephalic, atraumatic Eyes-  Sclera clear, conjunctiva pink Ears- hearing intact  Oropharynx- clear Neck- supple, no JVP Lymph- no cervical lymphadenopathy Lungs- Clear to ausculation bilaterally, normal work of breathing Heart- Regular rate and rhythm, no murmurs, rubs or gallops, PMI not laterally displaced GI- soft, NT, ND, + BS Extremities- no clubbing, cyanosis, or edema MS- no significant deformity or atrophy Skin- no rash or lesion Psych- euthymic mood, full affect Neuro- strength and sensation are intact  ekg today reveals sinus bradycardia 46 bpm, PR 210, LVH, otherwise normal ekg  Assessment and Plan:

## 2011-12-15 NOTE — Assessment & Plan Note (Signed)
Possible recurrence of afib post ablation, though his symptoms are also consistent with pacs.  Given his recent difficulty with infection/ diverticulitis, recurrent afib in this setting would not be uncommon.  Continue current medicines and place an event monitor to better characterize his palpitations.

## 2012-01-05 ENCOUNTER — Telehealth: Payer: Self-pay | Admitting: Internal Medicine

## 2012-01-05 NOTE — Telephone Encounter (Signed)
New msg Pt wants to talk to you about procedure that he had done in 2012. He said it needs to be re-coded for insurance to pay.

## 2012-01-06 NOTE — Telephone Encounter (Signed)
Pt calling to state he stayed overnight after his ablation due to some complications. He is inquiring as to  whether he was supposed to be inpt or observation status after his 02/2011 ablation.   It look like he was outpt and never admitted.  His insurance pays 50% of the allowable for his anesthesia if he is outpt and 100% if he is inpt.

## 2012-01-10 NOTE — Telephone Encounter (Signed)
His stay is considered outpatient.  He did not qualify for inpatient stay.

## 2012-01-14 NOTE — Telephone Encounter (Signed)
Pt informed will be outpt and understands

## 2012-02-13 ENCOUNTER — Telehealth: Payer: Self-pay | Admitting: Gastroenterology

## 2012-02-13 MED ORDER — AMOXICILLIN-POT CLAVULANATE 875-125 MG PO TABS: 1.0000 | ORAL_TABLET | Freq: Two times a day (BID) | ORAL | Status: AC

## 2012-02-13 NOTE — Telephone Encounter (Signed)
He called, having a return of the same type of lower abd pain as during previous diverticulitis episode.  No fevers or chills, no nausea or vomiting.  Only going on for 1-2 days now but he is worried about a repeat, prolonged bout.  I called in augmentin 10 days bid as this seemed to work best last time.    He knows to call if he worsens.

## 2012-02-28 ENCOUNTER — Other Ambulatory Visit: Payer: Self-pay | Admitting: Internal Medicine

## 2012-02-28 MED ORDER — LOSARTAN POTASSIUM 50 MG PO TABS: 50.0000 mg | ORAL_TABLET | Freq: Every day | ORAL | Status: DC

## 2012-02-29 ENCOUNTER — Other Ambulatory Visit: Payer: Self-pay | Admitting: *Deleted

## 2012-02-29 MED ORDER — LOSARTAN POTASSIUM 50 MG PO TABS: 50.0000 mg | ORAL_TABLET | Freq: Every day | ORAL | Status: DC

## 2012-03-15 ENCOUNTER — Ambulatory Visit (INDEPENDENT_AMBULATORY_CARE_PROVIDER_SITE_OTHER): Payer: PRIVATE HEALTH INSURANCE | Admitting: Internal Medicine

## 2012-03-15 ENCOUNTER — Encounter: Payer: Self-pay | Admitting: Internal Medicine

## 2012-03-15 VITALS — BP 137/82 | HR 61 | Resp 18 | Ht 73.0 in | Wt 230.0 lb

## 2012-03-15 DIAGNOSIS — I4891 Unspecified atrial fibrillation: Secondary | ICD-10-CM

## 2012-03-15 NOTE — Progress Notes (Signed)
PCP: Jerl Mina, MD  Jorge Peterson is a 51 y.o. male who presents today for routine electrophysiology followup.  Since last being seen in our clinic, the patient reports doing very well.  His afib has resolved since he has recovered from diverticulitis.  He is pleased with his current health state.  Today, he denies symptoms of palpitations, chest pain, shortness of breath,  lower extremity edema, dizziness, presyncope, or syncope.  The patient is otherwise without complaint today.   Past Medical History  Diagnosis Date  . Atrial fibrillation     s/p afib ablation 7/12  . Hyperlipidemia   . Bradycardia     asymptomatic  . Sleep apnea     mild  . Diverticulitis    Past Surgical History  Procedure Date  . Shoulder surgery     x 2  . Back surgery   . Atrial ablation surgery 03/24/11    PVI by JA  . Fistula surgery     Current Outpatient Prescriptions  Medication Sig Dispense Refill  . Coenzyme Q10 (COQ10) 150 MG CAPS Take 1 capsule by mouth daily.        . fish oil-omega-3 fatty acids 1000 MG capsule Take 1 g by mouth daily.        . folic acid (FOLVITE) 400 MCG tablet Take 400 mcg by mouth daily.        [name] losartan (COZAAR) 50 MG tablet Take 1 tablet (50 mg total) by mouth daily.  90 tablet  3  . Multiple Vitamin (MULTIVITAMIN) tablet Take 1 tablet by mouth daily.        [name] omeprazole (PRILOSEC) 40 MG capsule       . Probiotic Product (PROBIOTIC FORMULA PO) Take 1 capsule by mouth daily.      . simvastatin (ZOCOR) 40 MG tablet Take 40 mg by mouth at bedtime.        [name] zolpidem (AMBIEN) 5 MG tablet Take 1 tablet (5 mg total) by mouth at bedtime.  30 tablet  1  . diphenhydrAMINE (BENADRYL) 25 mg capsule Take 1 capsule (25 mg total) by mouth at bedtime as needed for itching or sleep.  30 capsule  1    Physical Exam: Filed Vitals:   03/15/12 0948  BP: 137/82  Pulse: 61  Resp: 18  Height: 6\' 1"  (1.854 m)  Weight: 230 lb (104.327 kg)  SpO2: 98%    GEN- The patient is well  appearing, alert and oriented x 3 today.   Head- normocephalic, atraumatic Eyes-  Sclera clear, conjunctiva pink Ears- hearing intact Oropharynx- clear Lungs- Clear to ausculation bilaterally, normal work of breathing Heart- Regular rate and rhythm, no murmurs, rubs or gallops, PMI not laterally displaced GI- soft, NT, ND, + BS Extremities- no clubbing, cyanosis, or edema  ekg today reveals sinus bradycardia 50 bpm, PR 234, LVH  Assessment and Plan:

## 2012-03-15 NOTE — Patient Instructions (Signed)
Your physician wants you to follow-up in: 12 months with Dr Allred You will receive a reminder letter in the mail two months in advance. If you don't receive a letter, please call our office to schedule the follow-up appointment.  

## 2012-03-15 NOTE — Assessment & Plan Note (Signed)
Doing well Event monitor from 4/29-5/30 is reviewed and did reveal rate controlled afib at that time His CHADSVASC score is 1.  We will therefore not start anticoagulation at this time If he develops further symptomatic afib, then we could consider repeat ablation at that time.  I will see him again in 12 months unless he has problems.

## 2012-07-22 ENCOUNTER — Telehealth: Payer: Self-pay | Admitting: Gastroenterology

## 2012-07-22 MED ORDER — AMOXICILLIN-POT CLAVULANATE 875-125 MG PO TABS: 1.0000 | ORAL_TABLET | Freq: Two times a day (BID) | ORAL | Status: DC

## 2012-07-22 NOTE — Telephone Encounter (Signed)
Pain in lower abdomen.  Bloating.  No fevers.  Minor change in bowels, more constipated.  This is exactly the way it felt to him before when he was diagnosed with diverticulitis. Reviewing Dr. Lauro Franklin note from last spring, looks like Augmentin is probably best choice abx.  He will call the office early next week to report on his symptoms.

## 2012-07-24 NOTE — Telephone Encounter (Signed)
Aram Beecham, pt started on Augmentin for diverticulitis.  Please check to see how he is doing today or tomorrow. Thanks

## 2012-07-24 NOTE — Telephone Encounter (Signed)
Pt reports he's doing better and he's watching his diet. He states he ate tons of peanuts and he could feel the flare coming on. He reports he called with early s&s so it didn't get as bad as last year. He will call if problems arise. He is having trouble taking his meds on time; doesn't miss a dose, but he will try to do better.

## 2012-07-24 NOTE — Telephone Encounter (Signed)
lmom for pt to call back

## 2012-10-23 ENCOUNTER — Telehealth: Payer: Self-pay | Admitting: Gastroenterology

## 2012-10-23 NOTE — Telephone Encounter (Signed)
This is a Dr Rhea Belton pt

## 2012-10-23 NOTE — Telephone Encounter (Signed)
lmom for pt to call back. Does he want a med for Diverticulitis or GERD?

## 2012-10-24 NOTE — Telephone Encounter (Signed)
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 In the past he took, Augmentin 875 mg 1 po bid x 10 days. Thanks.

## 2012-10-25 MED ORDER — AMOXICILLIN-POT CLAVULANATE 875-125 MG PO TABS: 1.0000 | ORAL_TABLET | Freq: Two times a day (BID) | ORAL | Status: DC

## 2012-10-25 NOTE — Telephone Encounter (Signed)
Informed pt Dr Rhea Belton gave him one script of Augmentin to take on his trip; pt stated understanding.

## 2012-10-25 NOTE — Telephone Encounter (Signed)
Yes, no refills

## 2012-11-26 ENCOUNTER — Emergency Department (HOSPITAL_COMMUNITY): Payer: PRIVATE HEALTH INSURANCE

## 2012-11-26 ENCOUNTER — Observation Stay (HOSPITAL_COMMUNITY)
Admission: EM | Admit: 2012-11-26 | Discharge: 2012-11-27 | DRG: 310 | Disposition: A | Discharge status: Home / Self Care | Payer: PRIVATE HEALTH INSURANCE | Attending: Emergency Medicine | Admitting: Emergency Medicine

## 2012-11-26 ENCOUNTER — Encounter (HOSPITAL_COMMUNITY): Payer: Self-pay | Admitting: *Deleted

## 2012-11-26 DIAGNOSIS — R7303 Prediabetes: Secondary | ICD-10-CM | POA: Diagnosis present

## 2012-11-26 DIAGNOSIS — E785 Hyperlipidemia, unspecified: Secondary | ICD-10-CM | POA: Insufficient documentation

## 2012-11-26 DIAGNOSIS — R001 Bradycardia, unspecified: Secondary | ICD-10-CM

## 2012-11-26 DIAGNOSIS — R942 Abnormal results of pulmonary function studies: Secondary | ICD-10-CM | POA: Insufficient documentation

## 2012-11-26 DIAGNOSIS — R0602 Shortness of breath: Secondary | ICD-10-CM | POA: Insufficient documentation

## 2012-11-26 DIAGNOSIS — R0789 Other chest pain: Secondary | ICD-10-CM | POA: Insufficient documentation

## 2012-11-26 DIAGNOSIS — R079 Chest pain, unspecified: Secondary | ICD-10-CM

## 2012-11-26 DIAGNOSIS — I4891 Unspecified atrial fibrillation: Principal | ICD-10-CM | POA: Insufficient documentation

## 2012-11-26 DIAGNOSIS — R9389 Abnormal findings on diagnostic imaging of other specified body structures: Secondary | ICD-10-CM | POA: Diagnosis present

## 2012-11-26 DIAGNOSIS — R7309 Other abnormal glucose: Secondary | ICD-10-CM | POA: Insufficient documentation

## 2012-11-26 HISTORY — DX: Gastro-esophageal reflux disease without esophagitis: K21.9

## 2012-11-26 LAB — TSH: TSH: 2.343 u[IU]/mL (ref 0.350–4.500)

## 2012-11-26 LAB — HEPATIC FUNCTION PANEL
ALT: 40 U/L (ref 0–53)
AST: 35 U/L (ref 0–37)
Albumin: 3.5 g/dL (ref 3.5–5.2)
Alkaline Phosphatase: 92 U/L (ref 39–117)
Total Protein: 6.9 g/dL (ref 6.0–8.3)

## 2012-11-26 LAB — CBC
HCT: 41.5 % (ref 39.0–52.0)
Hemoglobin: 14.2 g/dL (ref 13.0–17.0)
RBC: 5.12 MIL/uL (ref 4.22–5.81)

## 2012-11-26 LAB — BASIC METABOLIC PANEL
CO2: 28 mEq/L (ref 19–32)
Glucose, Bld: 120 mg/dL — ABNORMAL HIGH (ref 70–99)
Potassium: 3.6 mEq/L (ref 3.5–5.1)
Sodium: 138 mEq/L (ref 135–145)

## 2012-11-26 LAB — POCT I-STAT TROPONIN I: Troponin i, poc: 0 ng/mL (ref 0.00–0.08)

## 2012-11-26 LAB — APTT: aPTT: 23 seconds — ABNORMAL LOW (ref 24–37)

## 2012-11-26 LAB — HEPARIN LEVEL (UNFRACTIONATED): Heparin Unfractionated: 0.35 IU/mL (ref 0.30–0.70)

## 2012-11-26 LAB — HEMOGLOBIN A1C: Hgb A1c MFr Bld: 6.5 % — ABNORMAL HIGH (ref <5.7)

## 2012-11-26 MED ORDER — ONE-DAILY MULTI VITAMINS PO TABS: 1.0000 | ORAL_TABLET | Freq: Every day | ORAL | Status: DC

## 2012-11-26 MED ORDER — SODIUM CHLORIDE 0.9 % IV SOLN
250.0000 mL | INTRAVENOUS | Status: DC | PRN
  Administered 2012-11-26: 250 mL via INTRAVENOUS

## 2012-11-26 MED ORDER — ADULT MULTIVITAMIN W/MINERALS CH
1.0000 | ORAL_TABLET | Freq: Every day | ORAL | Status: DC
  Administered 2012-11-26 – 2012-11-27 (×2): 1 via ORAL
  Filled 2012-11-26 (×2): qty 1

## 2012-11-26 MED ORDER — OFF THE BEAT BOOK
Freq: Once | Status: AC
  Administered 2012-11-26: 18:00:00
  Filled 2012-11-26: qty 1

## 2012-11-26 MED ORDER — ACETAMINOPHEN 325 MG PO TABS
650.0000 mg | ORAL_TABLET | ORAL | Status: DC | PRN
  Administered 2012-11-26 – 2012-11-27 (×5): 650 mg via ORAL
  Filled 2012-11-26 (×6): qty 2

## 2012-11-26 MED ORDER — ALPRAZOLAM 0.25 MG PO TABS: 0.2500 mg | ORAL_TABLET | Freq: Two times a day (BID) | ORAL | Status: DC | PRN

## 2012-11-26 MED ORDER — OMEGA-3-ACID ETHYL ESTERS 1 G PO CAPS
1.0000 g | ORAL_CAPSULE | Freq: Every day | ORAL | Status: DC
  Administered 2012-11-26 – 2012-11-27 (×2): 1 g via ORAL
  Filled 2012-11-26 (×2): qty 1

## 2012-11-26 MED ORDER — RISAQUAD PO CAPS
1.0000 | ORAL_CAPSULE | Freq: Every day | ORAL | Status: DC
  Administered 2012-11-27: 1 via ORAL
  Filled 2012-11-26 (×3): qty 1

## 2012-11-26 MED ORDER — ZOLPIDEM TARTRATE 5 MG PO TABS
5.0000 mg | ORAL_TABLET | Freq: Every evening | ORAL | Status: DC | PRN
  Administered 2012-11-26: 5 mg via ORAL
  Filled 2012-11-26: qty 1

## 2012-11-26 MED ORDER — ASPIRIN EC 81 MG PO TBEC
81.0000 mg | DELAYED_RELEASE_TABLET | Freq: Every day | ORAL | Status: DC
  Administered 2012-11-26 – 2012-11-27 (×2): 81 mg via ORAL
  Filled 2012-11-26 (×2): qty 1

## 2012-11-26 MED ORDER — FOLIC ACID 400 MCG PO TABS: 400.0000 ug | ORAL_TABLET | Freq: Every day | ORAL | Status: DC

## 2012-11-26 MED ORDER — SIMVASTATIN 40 MG PO TABS
40.0000 mg | ORAL_TABLET | Freq: Every day | ORAL | Status: DC
  Administered 2012-11-26: 40 mg via ORAL
  Filled 2012-11-26 (×2): qty 1

## 2012-11-26 MED ORDER — ONDANSETRON HCL 4 MG/2ML IJ SOLN: 4.0000 mg | Freq: Four times a day (QID) | INTRAMUSCULAR | Status: DC | PRN

## 2012-11-26 MED ORDER — OMEGA-3 FATTY ACIDS 1000 MG PO CAPS: 1.0000 g | ORAL_CAPSULE | Freq: Every day | ORAL | Status: DC

## 2012-11-26 MED ORDER — HEPARIN BOLUS VIA INFUSION
4000.0000 [IU] | Freq: Once | INTRAVENOUS | Status: AC
  Administered 2012-11-26: 4000 [IU] via INTRAVENOUS

## 2012-11-26 MED ORDER — LOSARTAN POTASSIUM 50 MG PO TABS
50.0000 mg | ORAL_TABLET | Freq: Every day | ORAL | Status: DC
  Administered 2012-11-26 – 2012-11-27 (×2): 50 mg via ORAL
  Filled 2012-11-26 (×2): qty 1

## 2012-11-26 MED ORDER — PANTOPRAZOLE SODIUM 40 MG PO TBEC
40.0000 mg | DELAYED_RELEASE_TABLET | Freq: Every day | ORAL | Status: DC
  Administered 2012-11-26 – 2012-11-27 (×2): 40 mg via ORAL
  Filled 2012-11-26 (×2): qty 1

## 2012-11-26 MED ORDER — HEPARIN (PORCINE) IN NACL 100-0.45 UNIT/ML-% IJ SOLN
1600.0000 [IU]/h | INTRAMUSCULAR | Status: DC
  Administered 2012-11-26 – 2012-11-27 (×2): 1400 [IU]/h via INTRAVENOUS
  Filled 2012-11-26 (×3): qty 250

## 2012-11-26 MED ORDER — SODIUM CHLORIDE 0.9 % IJ SOLN: 3.0000 mL | INTRAMUSCULAR | Status: DC | PRN

## 2012-11-26 MED ORDER — NITROGLYCERIN 0.4 MG SL SUBL
0.4000 mg | SUBLINGUAL_TABLET | SUBLINGUAL | Status: DC | PRN
  Administered 2012-11-26: 0.4 mg via SUBLINGUAL
  Filled 2012-11-26: qty 25

## 2012-11-26 MED ORDER — CO Q-10 150 MG PO CAPS: 1.0000 | ORAL_CAPSULE | Freq: Every day | ORAL | Status: DC

## 2012-11-26 MED ORDER — PROBIOTIC FORMULA PO CAPS: 1.0000 | ORAL_CAPSULE | Freq: Every day | ORAL | Status: DC

## 2012-11-26 MED ORDER — IOHEXOL 350 MG/ML SOLN
80.0000 mL | Freq: Once | INTRAVENOUS | Status: AC | PRN
  Administered 2012-11-26: 80 mL via INTRAVENOUS

## 2012-11-26 MED ORDER — FOLIC ACID 0.5 MG HALF TAB
0.5000 mg | ORAL_TABLET | Freq: Every day | ORAL | Status: DC
  Administered 2012-11-26 – 2012-11-27 (×2): 0.5 mg via ORAL
  Filled 2012-11-26: qty 1
  Filled 2012-11-26: qty 0.5

## 2012-11-26 MED ORDER — SODIUM CHLORIDE 0.9 % IJ SOLN
3.0000 mL | Freq: Two times a day (BID) | INTRAMUSCULAR | Status: DC
  Administered 2012-11-26 (×2): 3 mL via INTRAVENOUS

## 2012-11-26 NOTE — Progress Notes (Signed)
ANTICOAGULATION CONSULT NOTE - Initial Consult  Pharmacy Consult for heparin Indication: atrial fibrillation  Allergies  Allergen Reactions  . Oxycodone-Acetaminophen Itching and Rash    Patient Measurements: Height: 6' 0.83" (185 cm) Weight: 229 lb 4.5 oz (104 kg) IBW/kg (Calculated) : 79.52 Heparin Dosing Weight: 95kg  Vital Signs: Temp: 98.9 F (37.2 C) (03/30 0437) Temp src: Oral (03/30 0437) BP: 101/65 mmHg (03/30 0830) Pulse Rate: 48 (03/30 0830)  Labs:  Recent Labs  11/26/12 0451 11/26/12 0505 11/26/12 0904  HGB 14.2  --   --   HCT 41.5  --   --   PLT 138*  --   --   APTT  --   --  23*  LABPROT  --   --  12.9  INR  --   --  0.98  CREATININE 1.15  --   --   CKTOTAL  --  328*  --   CKMB  --  2.7  --     Estimated Creatinine Clearance: 93.8 ml/min (by C-G formula based on Cr of 1.15).   Medical History: Past Medical History  Diagnosis Date  . Atrial fibrillation     s/p afib ablation 7/12  . Hyperlipidemia   . Bradycardia     asymptomatic  . Sleep apnea     mild  . Diverticulitis   Assessment: 52 year old male with history of afib and ablation 7/12, presents to [facility] with more consistent afib noted over the past few weeks and duration lasting days instead of hours as usual. He was not on any anticoagulants pta. Baseline labs are within normal limits. CT was done to r/o PE and was negative.  Goal of Therapy:  Heparin level 0.3-0.7 units/ml Monitor platelets by anticoagulation protocol: Yes   Plan:  Give 4000 units bolus x 1 Start heparin infusion at 1400 units/hr Check anti-Xa level in 6 hours and daily while on heparin Continue to monitor H&H and platelets  Sheppard Coil PharmD., BCPS Clinical Pharmacist Pager [phone] 11/26/2012 9:49 AM

## 2012-11-26 NOTE — ED Notes (Signed)
Cardiology at bedside seeing patient. Pt is alert and oriented in NAD. States chest pressure, pain between shoulder blades "sharp".

## 2012-11-26 NOTE — ED Notes (Signed)
C/o pain between shoulder blades. States he has been having cold sweats and has been in Afib for a couple days. Has a history of Afib but does not usually stay in it long. C/o dizziness when standing and also when walking up stairs, also c/o generalized weakness.

## 2012-11-26 NOTE — H&P (Signed)
History and Physical   Patient ID: Jorge Peterson MRN: [id number], DOB/AGE: 1961-04-14 52 y.o. Date of Encounter: 11/26/2012  Primary Physician: Jerl Mina, MD Primary Cardiologist: TG/JA  Chief Complaint:  Chest pain  HPI: Jorge Peterson is a 52 y.o. male with a history of atrial fibrillation but not CAD. For 3 weeks, has been having more frequent episodes of atrial fibrillation but they would always resolved in < an hour. However, he went into atrial fibrillation 3 days ago and did not go back in. HR 50s-60s at rest and he doesn't ever think it got above 100. Occasional orthostatic dizziness but no presyncope. 2 days ago, chest pressure started. 3/10, goes through to his back. A little SOB and diaphoresis. Increased DOE, fatigue and weakness. Was able to exercise yesterday and had not problems during exercise. He felt a little better during exercise but felt worse afterwards. Last pm, pressure seemed a little worse. He was having chills. Palpitations were worse but HR still not rapid. He was also still having pain between his shoulder blades. HA as well. Came to the ER because symptoms generally seemed a little worse. CT negative for PE. HR dropping into the 40s at times but he has no symptoms from this.    Past Medical History  Diagnosis Date  . Atrial fibrillation     s/p afib ablation 7/12  . Hyperlipidemia   . Bradycardia     asymptomatic  . Sleep apnea     mild  . Diverticulitis     Surgical History:  Past Surgical History  Procedure Laterality Date  . Shoulder surgery      x 2  . Back surgery    . Atrial ablation surgery  03/24/11    PVI by JA  . Fistula surgery       I have reviewed the patient's current medications. Medication Sig  aspirin EC 81 MG tablet Take 81 mg by mouth daily.  CINNAMON PO Take 1 tablet by mouth daily.  Coenzyme Q10 (COQ10) 150 MG CAPS Take 1 capsule by mouth daily.   fish oil-omega-3 fatty acids 1000 MG capsule Take 1 g by mouth daily.    folic acid (FOLVITE) 400 MCG tablet Take 400 mcg by mouth daily.    losartan (COZAAR) 50 MG tablet Take 50 mg by mouth daily.  Multiple Vitamin (MULTIVITAMIN) tablet Take 1 tablet by mouth daily.   omeprazole (PRILOSEC) 40 MG capsule Take 40 mg by mouth daily.   Probiotic Product (PROBIOTIC FORMULA PO) Take 1 capsule by mouth daily.  simvastatin (ZOCOR) 40 MG tablet Take 40 mg by mouth at bedtime.    Scheduled Meds: Continuous Infusions: PRN Meds:.    Allergies:  Allergies  Allergen Reactions  . Oxycodone-Acetaminophen Itching and Rash    History   Social History  . Marital Status: Married    Spouse Name: N/A    Number of Children: 3  . Years of Education: N/A   Occupational History  . Minister/ [employer]    Social History Main Topics  . Smoking status: Never Smoker   . Smokeless tobacco: Never Used  . Alcohol Use: No  . Drug Use: No  . Sexually Active:    Other Topics Concern  . Not on file   Social History Narrative   Lives in [place].  3 children are all healthy.   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   No siblings with CAD.    Family History  Problem Relation  Age of Onset  . Diabetes Father   . Heart disease Father     CABG at 54   Family Status  Relation Status Death Age  . Mother Alive     Born 1936, No cardiac issues  . Father Alive     Born [phone]    Review of Systems:   Full 14-point review of systems otherwise negative except as noted above.  Physical Exam: Blood pressure 101/65, pulse 48, temperature 98.9 F (37.2 C), temperature source Oral, resp. rate 17, SpO2 98.00%. General: Well developed, well nourished,male in no acute distress. Head: Normocephalic, atraumatic, sclera non-icteric, no xanthomas, nares are without discharge. Dentition: good Neck: No carotid bruits. JVD not elevated. No thyromegally Lungs: Good expansion bilaterally. without wheezes or rhonchi. Clear to auscultation bilaterally with Heart:  IRRegular rate and rhythm with S1 S2.  No S3 or S4.  No murmur, no rubs, or gallops appreciated. Abdomen: Soft, non-tender, non-distended with normoactive bowel sounds. No hepatomegaly. No rebound/guarding. No obvious abdominal masses. Msk:  Strength and tone appear normal for age. No joint deformities or effusions, no spine or costo-vertebral angle tenderness. Extremities: No clubbing or cyanosis. No edema.  Distal pedal pulses are 2+ in 4 extrem Neuro: Alert and oriented X 3. Moves all extremities spontaneously. No focal deficits noted. Psych:  Responds to questions appropriately with a normal affect. Skin: No rashes or lesions noted  Labs:   Lab Results  Component Value Date   WBC 6.9 11/26/2012   HGB 14.2 11/26/2012   HCT 41.5 11/26/2012   MCV 81.1 11/26/2012   PLT 138* 11/26/2012     Recent Labs Lab 11/26/12 0451  NA 138  K 3.6  CL 101  CO2 28  BUN 15  CREATININE 1.15  CALCIUM 9.3  PROT 6.9  BILITOT 0.5  ALKPHOS 92  ALT 40  AST 35  GLUCOSE 120*    Recent Labs  11/26/12 0505  CKTOTAL 328*  CKMB 2.7    Recent Labs  11/26/12 0502  TROPIPOC 0.00   Lab Results  Component Value Date   DDIMER 0.56* 11/26/2012    Radiology/Studies: Dg Chest Port 1 View 11/26/2012  *RADIOLOGY REPORT*  Clinical Data: Chest pain and shortness of breath.  PORTABLE CHEST - 1 VIEW  Comparison: Chest x-ray 11/20/2009.  Findings: Lung volumes are normal.  No consolidative airspace disease.  No pleural effusions.  No pneumothorax.  No pulmonary nodule or mass noted.  Pulmonary vasculature and the cardiomediastinal silhouette are within normal limits.  IMPRESSION: 1. No radiographic evidence of acute cardiopulmonary disease.   Original Report Authenticated By: Trudie Reed, M.D.   Ct Angio Chest Pe W/cm &/or Wo Cm  11/26/2012  *RADIOLOGY REPORT*  Clinical Data: Chest pain.  Atrial fibrillation without anticoagulation.  CT ANGIOGRAPHY CHEST  Technique:  Multidetector CT imaging of the chest  using the standard protocol during bolus administration of intravenous contrast. Multiplanar reconstructed images including MIPs were obtained and reviewed to evaluate the vascular anatomy.  Contrast: 80mL OMNIPAQUE IOHEXOL 350 MG/ML SOLN  Comparison: Chest radiograph 11/26/2012  Findings: No evidence for a pulmonary embolism.  There is soft tissue fullness in the left hilum measuring 9 mm on sequence #4, image 24.  No significant mediastinal lymphadenopathy.  No significant pericardial or pleural fluid.  No gross abnormality to the upper abdominal structures including the visualized adrenal glands.  There is no significant axillary lymphadenopathy.  The trachea and mainstem bronchi are patent.  There is a lobulated lesion in the  periphery of the left upper lobe on sequence 5, image 28.  This lesion roughly measures 1.2 x 1.2 cm.  There are adjacent interstitial densities around this lesion. No acute bony abnormality.  IMPRESSION: No evidence for a pulmonary embolism.  There is an irregular nodular lesion in the periphery of the left upper lobe with adjacent interstitial densities. Differential diagnosis would include a focal area of infection versus neoplasm. Recommend a short-term follow-up CT in 4 weeks to evaluate the stability of this lesion.  There is also concern for mild left hilar lymphadenopathy which could be reactive or neoplastic in etiology.   Original Report Authenticated By: Richarda Overlie, M.D.    Echo: 03/11/2011 Conclusions - Left ventricle: The cavity size was normal. Wall thickness was normal. The estimated ejection fraction was 60%. Wall motion was normal; there were no regional wall motion abnormalities. - Left atrium: The atrium was mildly dilated. - Right ventricle: The cavity size was mildly dilated. Systolic function was mildly reduced. - Right atrium: The atrium was mildly dilated.  ECG: 26-Nov-2012 04:36:58  Atrial fibrillation with premature ventricular or aberrantly conducted  complexes Minimal voltage criteria for LVH, may be normal variant Abnormal ECG 71mm/s 80mm/mV 100Hz  8.0.1 12SL 241 HD CID: 4 Referred by: Unconfirmed Vent. rate 70 BPM PR interval * ms QRS duration 86 ms QT/QTc 378/408 ms P-R-T axes * -2 67  ASSESSMENT AND PLAN:  Principal Problem:   Chest pain, mid sternal - admit, rule out MI. Add heparin. Systolic blood pressure less than 100 so will not add nitrates at this time. If cardiac enzymes remain negative, M.D. advise on further evaluation with stress test or cath.  Active Problems:   Atrial fibrillation - rate low at times and he is symptomatic with this. EP to see in a.m. continue to monitor closely.    HLD (hyperlipidemia) - check lipid profile in a.m.    Abnormal CT of the chest  - repeat CT in one month, patient and wife aware   Signed, Theodore Demark, PA-C 11/26/2012 8:50 AM Beeper [phone]   History and all data above reviewed.  Patient examined.  I agree with the findings as above.  He has been in persistent atrial fib for about three days.  He is also complaining of some lower chest, upper epigastric discomfort with chills.  This is an atypical discomfort.  There is no objective evidence ischemia.  He has not had CAD in the past.  He does not report fever, cough or any GI complaintsThe patient exam reveals QIO:[id number]  ,  Lungs: Clear  ,  Abd: Positive bowel sounds, no rebound no guarding, Ext No edema  .  All available labs, radiology testing, previous records reviewed. Agree with documented assessment and plan. Chest pain.  This is atypical.  I don't know if this could be related to the abnormality on CT.  Consider coronary CT angiogram if he rules out.  Otherwise probably an outpatient stress test.  Atrial fib:  Plan per EP.  Possible repeat ablation.    Jorge Peterson  11:08 AM  11/26/2012

## 2012-11-26 NOTE — ED Provider Notes (Addendum)
History     CSN: [id number]  Arrival date & time 11/26/12  0425   First MD Initiated Contact with Patient 11/26/12 [phone]      Chief Complaint  Patient presents with  . Chest Pain    (Consider location/radiation/quality/duration/timing/severity/associated sxs/prior treatment) HPI 52 year old male presents to emergency room with complaint of substernal chest pressure, persistent atrial fibrillation, pain in between his shoulder blades, diffuse muscle aching, hot and cold sensation, weakness, and general malaise.  Symptoms have been ongoing for last 2 days.  He has had occasional episodes were he feels he is not getting enough air.  Patient and wife have noticed that he is very fatigued and short of breath when going up stairs.  Patient has history of atrial fibrillation and bradycardia, status post ablation in 2012.  He reports since that ablation.  He occasionally has episodes where he feels an irregular heart rate, but this usually is very short lived.  Patient has noticed irregular heartbeat for the last 2 days.  He's not on any anticoagulation.  Patient, reports he's normally very active person, and has been unable to get off the couch for the last day and a half.  No fevers.  No URI symptoms.  Patient has an occasional dry cough when he is acutely short of breath.  Patient has history of bradycardia, sometimes with heart rates into the 30s.  This is long-standing, and does not cause him any discomfort. Past Medical History  Diagnosis Date  . Atrial fibrillation     s/p afib ablation 7/12  . Hyperlipidemia   . Bradycardia     asymptomatic  . Sleep apnea     mild  . Diverticulitis     Past Surgical History  Procedure Laterality Date  . Shoulder surgery      x 2  . Back surgery    . Atrial ablation surgery  03/24/11    PVI by JA  . Fistula surgery      Family History  Problem Relation Age of Onset  . Diabetes Father   . Heart disease Father     History  Substance Use  Topics  . Smoking status: Never Smoker   . Smokeless tobacco: Never Used  . Alcohol Use: No      Review of Systems  See History of Present Illness; otherwise all other systems are reviewed and negative  Allergies  Oxycodone-acetaminophen  Home Medications   Current Outpatient Rx  Name  Route  Sig  Dispense  Refill  . aspirin EC 81 MG tablet   Oral   Take 81 mg by mouth daily.         [name] CINNAMON PO   Oral   Take 1 tablet by mouth daily.         . Coenzyme Q10 (COQ10) 150 MG CAPS   Oral   Take 1 capsule by mouth daily.          . fish oil-omega-3 fatty acids 1000 MG capsule   Oral   Take 1 g by mouth daily.          . folic acid (FOLVITE) 400 MCG tablet   Oral   Take 400 mcg by mouth daily.           [name] losartan (COZAAR) 50 MG tablet   Oral   Take 50 mg by mouth daily.         . Multiple Vitamin (MULTIVITAMIN) tablet   Oral   Take 1 tablet by  mouth daily.          [name] omeprazole (PRILOSEC) 40 MG capsule   Oral   Take 40 mg by mouth daily.          . Probiotic Product (PROBIOTIC FORMULA PO)   Oral   Take 1 capsule by mouth daily.         . simvastatin (ZOCOR) 40 MG tablet   Oral   Take 40 mg by mouth at bedtime.            BP 125/71  Pulse 70  Temp(Src) 98.9 F (37.2 C) (Oral)  Resp 16  SpO2 97%  Physical Exam  Nursing note and vitals reviewed. Constitutional: He is oriented to person, place, and time. He appears well-developed and well-nourished.  Fatigued appearing  HENT:  Head: Normocephalic and atraumatic.  Nose: Nose normal.  Mouth/Throat: Oropharynx is clear and moist.  Eyes: Conjunctivae and EOM are normal. Pupils are equal, round, and reactive to light.  Neck: Normal range of motion. Neck supple. No JVD present. No tracheal deviation present. No thyromegaly present.  Cardiovascular: Normal heart sounds and intact distal pulses.  Exam reveals no gallop and no friction rub.   No murmur heard. Bradycardia with irregular  rhythm  Pulmonary/Chest: Effort normal and breath sounds normal. No stridor. No respiratory distress. He has no wheezes. He has no rales. He exhibits no tenderness.  Abdominal: Soft. Bowel sounds are normal. He exhibits no distension and no mass. There is no tenderness. There is no rebound and no guarding.  Musculoskeletal: Normal range of motion. He exhibits tenderness (mild diffuse tenderness in calves, biceps). He exhibits no edema.  Lymphadenopathy:    He has no cervical adenopathy.  Neurological: He is alert and oriented to person, place, and time. He exhibits normal muscle tone. Coordination normal.  Skin: Skin is warm and dry. No rash noted. No erythema. No pallor.  Psychiatric: He has a normal mood and affect. His behavior is normal. Judgment and thought content normal.    ED Course  Procedures (including critical care time)  Labs Reviewed  CBC - Abnormal; Notable for the following:    Platelets 138 (*)    All other components within normal limits  BASIC METABOLIC PANEL - Abnormal; Notable for the following:    Glucose, Bld 120 (*)    GFR calc non Af Amer 71 (*)    GFR calc Af Amer 82 (*)    All other components within normal limits  D-DIMER, QUANTITATIVE - Abnormal; Notable for the following:    D-Dimer, Quant 0.56 (*)    All other components within normal limits  CK TOTAL AND CKMB - Abnormal; Notable for the following:    Total CK 328 (*)    All other components within normal limits  HEPATIC FUNCTION PANEL  POCT I-STAT TROPONIN I   Ct Angio Chest Pe W/cm &/or Wo Cm  11/26/2012  *RADIOLOGY REPORT*  Clinical Data: Chest pain.  Atrial fibrillation without anticoagulation.  CT ANGIOGRAPHY CHEST  Technique:  Multidetector CT imaging of the chest using the standard protocol during bolus administration of intravenous contrast. Multiplanar reconstructed images including MIPs were obtained and reviewed to evaluate the vascular anatomy.  Contrast: 80mL OMNIPAQUE IOHEXOL 350 MG/ML  SOLN  Comparison: Chest radiograph 11/26/2012  Findings: No evidence for a pulmonary embolism.  There is soft tissue fullness in the left hilum measuring 9 mm on sequence #4, image 24.  No significant mediastinal lymphadenopathy.  No significant pericardial or pleural  fluid.  No gross abnormality to the upper abdominal structures including the visualized adrenal glands.  There is no significant axillary lymphadenopathy.  The trachea and mainstem bronchi are patent.  There is a lobulated lesion in the periphery of the left upper lobe on sequence 5, image 28.  This lesion roughly measures 1.2 x 1.2 cm.  There are adjacent interstitial densities around this lesion. No acute bony abnormality.  IMPRESSION: No evidence for a pulmonary embolism.  There is an irregular nodular lesion in the periphery of the left upper lobe with adjacent interstitial densities. Differential diagnosis would include a focal area of infection versus neoplasm. Recommend a short-term follow-up CT in 4 weeks to evaluate the stability of this lesion.  There is also concern for mild left hilar lymphadenopathy which could be reactive or neoplastic in etiology.   Original Report Authenticated By: Richarda Overlie, M.D.    Dg Chest Port 1 View  11/26/2012  *RADIOLOGY REPORT*  Clinical Data: Chest pain and shortness of breath.  PORTABLE CHEST - 1 VIEW  Comparison: Chest x-ray 11/20/2009.  Findings: Lung volumes are normal.  No consolidative airspace disease.  No pleural effusions.  No pneumothorax.  No pulmonary nodule or mass noted.  Pulmonary vasculature and the cardiomediastinal silhouette are within normal limits.  IMPRESSION: 1. No radiographic evidence of acute cardiopulmonary disease.   Original Report Authenticated By: Trudie Reed, M.D.     Date: 11/26/2012  Rate: 70  Rhythm: atrial fibrillation  QRS Axis: normal  Intervals: afib  ST/T Wave abnormalities: normal  Conduction Disutrbances:none  Narrative Interpretation: afib new from most  recent  Old EKG Reviewed: changes noted    1. Chest pain   2. A-fib   3. Bradycardia       MDM  52 year old male with chest discomfort, and general malaise with A. fib, ongoing for the last 2 days.  Patient describes pain in between his shoulder blades and some dyspnea on exertion, and intermittent shortness of breath at rest.  Differential includes ACS, PE, unstable angina.  Given that he has been in A. fib for over 48 hours, concern for possible PE.  D-dimer slightly elevated.  I think it is less likely has a dissection.  Plan for CT angio chest for further evaluation, and we'll discuss with cardiology.      7:33 AM Case d/w Dr Antoine Poche with [facility] cardiology.  Will see in consult.  Olivia Mackie, MD 11/26/12 [id number]  Olivia Mackie, MD 11/26/12 [phone]

## 2012-11-26 NOTE — ED Notes (Signed)
Chest tightness -substernal for x 2 days.  C/o upper back pain and bilateral leg pain. Took some 325 mg asa ~ midnight.

## 2012-11-26 NOTE — Progress Notes (Signed)
Patient admitted to [phone] via stretcher bed from ED.  Presented with [facility] booklet and introduced to staff as well as routine of unit.  Currently pain free and eating lunch.  Heparin gtt. Infusing at this time.

## 2012-11-26 NOTE — Progress Notes (Signed)
ANTICOAGULATION CONSULT NOTE - Follow Up Consult  Pharmacy Consult for Heparin Indication: atrial fibrillation  Allergies  Allergen Reactions  . Oxycodone-Acetaminophen Itching and Rash    Patient Measurements: Height: 6' 0.83" (185 cm) Weight: 229 lb 4.5 oz (104 kg) IBW/kg (Calculated) : 79.52 Heparin Dosing Weight: 95kg  Vital Signs: Temp: 98.1 F (36.7 C) (03/30 1433) Temp src: Oral (03/30 1433) BP: 118/63 mmHg (03/30 1433) Pulse Rate: 48 (03/30 1148)  Labs:  Recent Labs  11/26/12 0451 11/26/12 0505 11/26/12 0904 11/26/12 1415 11/26/12 1707  HGB 14.2  --   --   --   --   HCT 41.5  --   --   --   --   PLT 138*  --   --   --   --   APTT  --   --  23*  --   --   LABPROT  --   --  12.9  --   --   INR  --   --  0.98  --   --   HEPARINUNFRC  --   --   --   --  0.35  CREATININE 1.15  --   --   --   --   CKTOTAL  --  328*  --   --   --   CKMB  --  2.7  --   --   --   TROPONINI  --   --  <0.30 <0.30  --     Estimated Creatinine Clearance: 93.8 ml/min (by C-G formula based on Cr of 1.15).   Medications:  Heparin 1400 units/hr  Assessment: 51yom on heparin for Afib. Heparin level (0.35) is therapeutic - will continue current rate and check follow-up level to confirm therapeutic. - No significant bleeding reported  Goal of Therapy:  Heparin level 0.3-0.7 units/ml Monitor platelets by anticoagulation protocol: Yes   Plan:  1. Continue heparin 1400 units/hr (14 ml/hr) 2. Check heparin level @ 0000 to confirm therapeutic  Cleon Dew [phone] 11/26/2012,5:50 PM

## 2012-11-27 DIAGNOSIS — R7303 Prediabetes: Secondary | ICD-10-CM | POA: Diagnosis present

## 2012-11-27 DIAGNOSIS — I498 Other specified cardiac arrhythmias: Secondary | ICD-10-CM

## 2012-11-27 LAB — HEPARIN LEVEL (UNFRACTIONATED)
Heparin Unfractionated: 0.22 IU/mL — ABNORMAL LOW (ref 0.30–0.70)
Heparin Unfractionated: 0.42 IU/mL (ref 0.30–0.70)

## 2012-11-27 LAB — COMPREHENSIVE METABOLIC PANEL
ALT: 47 U/L (ref 0–53)
AST: 36 U/L (ref 0–37)
Calcium: 9 mg/dL (ref 8.4–10.5)
Creatinine, Ser: 1.03 mg/dL (ref 0.50–1.35)
GFR calc Af Amer: >90 mL/min (ref >90)
Glucose, Bld: 150 mg/dL — ABNORMAL HIGH (ref 70–99)
Sodium: 139 mEq/L (ref 135–145)
Total Protein: 6.3 g/dL (ref 6.0–8.3)

## 2012-11-27 LAB — LIPID PANEL
Cholesterol: 128 mg/dL (ref 0–200)
LDL Cholesterol: 69 mg/dL (ref 0–99)
VLDL: 25 mg/dL (ref 0–40)

## 2012-11-27 LAB — CBC
HCT: 40.2 % (ref 39.0–52.0)
Hemoglobin: 13.6 g/dL (ref 13.0–17.0)
MCHC: 33.8 g/dL (ref 30.0–36.0)
RBC: 4.94 MIL/uL (ref 4.22–5.81)

## 2012-11-27 MED ORDER — ZOLPIDEM TARTRATE 5 MG PO TABS: 5.0000 mg | ORAL_TABLET | Freq: Every evening | ORAL | Status: DC | PRN

## 2012-11-27 MED ORDER — METOPROLOL TARTRATE 25 MG PO TABS: 25.0000 mg | ORAL_TABLET | Freq: Two times a day (BID) | ORAL | Status: DC | PRN

## 2012-11-27 NOTE — Discharge Summary (Addendum)
CARDIOLOGY DISCHARGE SUMMARY   Patient ID: Jorge Peterson MRN: [id number] DOB/AGE: 09-03-1960 52 y.o.  Admit date: 11/26/2012 Discharge date: 11/27/2012  Primary Discharge Diagnosis:    Chest pain, mid sternal Secondary Discharge Diagnosis:    Atrial fibrillation   HLD (hyperlipidemia)   Abnormal CT of the chest    Borderline diabetes   Procedures: CT of the chest     Hospital Course: Jorge Peterson is a 52 y.o. male with no history of CAD. He had a prolonged episode of atrial fibrillation that was associated with chest pain and possibly some dyspnea on exertion. The palpitations began 3 days prior to admission. After 3 days of palpitations and today's chest pain, he came to the emergency room because symptoms were getting worse. He was admitted for further evaluation and treatment.[name]  His heart rate was controlled so his medications were not changed. He was anticoagulated with heparin because the duration and been more than 48 hours. His cardiac enzymes were cycled and were negative for MI. There was concern for PE so a CT angiogram of the chest was performed. There was an abnormality on it and a repeat CT in 4 weeks was recommended. The patient and his wife are aware of this and will follow up on the CT. His blood sugar was noted to be slightly elevated so a hemoglobin A1c was performed. This is also slightly elevated at 6.5. This was discussed with the patient and his wife. It is recommended that he initiate a diabetic diet and followup with his primary care physician. Hopefully, with dietary management, he will not need medications. His platelets were slightly decreased but he was having no bleeding issues with this. A lipid profile was reviewed and showed some dyslipidemia with a slightly low HDL. He is encouraged to exercise regularly and eat a low cholesterol diet.  Jorge Peterson spontaneously converted to sinus rhythm overnight. Once he converted to sinus rhythm, his chest pain and  other symptoms resolved. On 11/27/2012, he was seen by Dr. Dietrich Pates. He was ambulating without chest pain or shortness of breath. Although he was bradycardic at rest, his heart rate increased appropriately with ambulation. Dr. Tenny Craw felt that he could be considered stable for discharge, to follow up as an outpatient. She recommended when necessary Lopressor 25 mg for palpitations but no other medication changes, because of his resting bradycardia. Jorge Peterson is considered stable for discharge, to follow up as an outpatient.  Labs:   Lab Results  Component Value Date   WBC 5.6 11/27/2012   HGB 13.6 11/27/2012   HCT 40.2 11/27/2012   MCV 81.4 11/27/2012   PLT 129* 11/27/2012     Recent Labs Lab 11/27/12 0456  NA 139  K 4.1  CL 102  CO2 28  BUN 12  CREATININE 1.03  CALCIUM 9.0  PROT 6.3  BILITOT 0.5  ALKPHOS 82  ALT 47  AST 36  GLUCOSE 150*    Recent Labs  11/26/12 0505 11/26/12 0904 11/26/12 1415 11/26/12 2055  CKTOTAL 328*  --   --   --   CKMB 2.7  --   --   --   TROPONINI  --  <0.30 <0.30 <0.30   Lipid Panel     Component Value Date/Time   CHOL 128 11/27/2012 0456   TRIG 123 11/27/2012 0456   HDL 34* 11/27/2012 0456   CHOLHDL 3.8 11/27/2012 0456   VLDL 25 11/27/2012 0456   LDLCALC 69 11/27/2012 0456  Recent Labs  11/26/12 0904  INR 0.98   Lab Results  Component Value Date   HGBA1C 6.5* 11/26/2012      Radiology: Dg Chest Port 1 View  11/26/2012 *RADIOLOGY REPORT* Clinical Data: Chest pain and shortness of breath. PORTABLE CHEST - 1 VIEW Comparison: Chest x-ray 11/20/2009. Findings: Lung volumes are normal. No consolidative airspace disease. No pleural effusions. No pneumothorax. No pulmonary nodule or mass noted. Pulmonary vasculature and the cardiomediastinal silhouette are within normal limits. IMPRESSION: 1. No radiographic evidence of acute cardiopulmonary disease. Original Report Authenticated By: Trudie Reed, M.D.   Ct Angio Chest Pe W/cm &/or Wo Cm    11/26/2012 *RADIOLOGY REPORT* Clinical Data: Chest pain. Atrial fibrillation without anticoagulation. CT ANGIOGRAPHY CHEST Technique: Multidetector CT imaging of the chest using the standard protocol during bolus administration of intravenous contrast. Multiplanar reconstructed images including MIPs were obtained and reviewed to evaluate the vascular anatomy. Contrast: 80mL OMNIPAQUE IOHEXOL 350 MG/ML SOLN Comparison: Chest radiograph 11/26/2012 Findings: No evidence for a pulmonary embolism. There is soft tissue fullness in the left hilum measuring 9 mm on sequence #4, image 24. No significant mediastinal lymphadenopathy. No significant pericardial or pleural fluid. No gross abnormality to the upper abdominal structures including the visualized adrenal glands. There is no significant axillary lymphadenopathy. The trachea and mainstem bronchi are patent. There is a lobulated lesion in the periphery of the left upper lobe on sequence 5, image 28. This lesion roughly measures 1.2 x 1.2 cm. There are adjacent interstitial densities around this lesion. No acute bony abnormality. IMPRESSION: No evidence for a pulmonary embolism. There is an irregular nodular lesion in the periphery of the left upper lobe with adjacent interstitial densities. Differential diagnosis would include a focal area of infection versus neoplasm. Recommend a short-term follow-up CT in 4 weeks to evaluate the stability of this lesion. There is also concern for mild left hilar lymphadenopathy which could be reactive or neoplastic in etiology. Original Report Authenticated By: Richarda Overlie, M.D.    EKG:  27-Nov-2012 04:14:09  Sinus bradycardia Otherwise normal ECG previous atrial fibrillation resolved Vent. rate 59 BPM PR interval 184 ms QRS duration 92 ms QT/QTc 420/415 ms P-R-T axes 23 -8 24  26-Nov-2012 04:36:58 Atrial fibrillation with premature ventricular or aberrantly conducted complexes Minimal voltage criteria for LVH, may be  normal variant Vent. rate 70 BPM PR interval * ms QRS duration 86 ms QT/QTc 378/408 ms P-R-T axes * -2 67   FOLLOW UP PLANS AND APPOINTMENTS Allergies  Allergen Reactions  . Oxycodone-Acetaminophen Itching and Rash     Medication List    TAKE these medications       aspirin EC 81 MG tablet  Take 81 mg by mouth daily.     CINNAMON PO  Take 1 tablet by mouth daily.     COQ10 150 MG Caps  Take 1 capsule by mouth daily.     fish oil-omega-3 fatty acids 1000 MG capsule  Take 1 g by mouth daily.     folic acid 400 MCG tablet  Commonly known as:  FOLVITE  Take 400 mcg by mouth daily.     losartan 50 MG tablet  Commonly known as:  COZAAR  Take 50 mg by mouth daily.     metoprolol tartrate 25 MG tablet  Commonly known as:  LOPRESSOR  Take 1 tablet (25 mg total) by mouth 2 (two) times daily as needed. For palpitations. Okay to take as long as heart rate is greater than 50.  multivitamin tablet  Take 1 tablet by mouth daily.     omeprazole 40 MG capsule  Commonly known as:  PRILOSEC  Take 40 mg by mouth daily.     PROBIOTIC FORMULA PO  Take 1 capsule by mouth daily.     simvastatin 40 MG tablet  Commonly known as:  ZOCOR  Take 40 mg by mouth at bedtime.     zolpidem 5 MG tablet  Commonly known as:  AMBIEN  Take 1 tablet (5 mg total) by mouth at bedtime as needed for sleep.        Discharge Orders   Future Appointments Provider Department Dept Phone   12/14/2012 1:15 PM Hillis Range, MD [facility] Main Office [place]) [phone]   Future Orders Complete By Expires     Diet - low sodium heart healthy  As directed     Scheduling Instructions:      Limit carbohydrates and sugars.    Increase activity slowly  As directed       Follow-up Information   Follow up with Hillis Range, MD On 12/14/2012. (at 1:15 pm)    Contact information:   [address], [address] [place] [address] [phone]       BRING ALL MEDICATIONS WITH YOU TO FOLLOW  UP APPOINTMENTS  Time spent with patient to include physician time: 33 min Signed: Theodore Demark, PA-C 11/27/2012, 4:16 PM Co-Sign MD

## 2012-11-27 NOTE — Progress Notes (Addendum)
Subjective: Feels great  No CP or SOB. Objective: Filed Vitals:   11/26/12 1433 11/26/12 1829 11/26/12 2134 11/27/12 0607  BP: 118/63  127/73 125/64  Pulse:  68 68 64  Temp: 98.1 F (36.7 C)  98.2 F (36.8 C) 98.2 F (36.8 C)  TempSrc: Oral  Oral Oral  Resp:      Height:      Weight:      SpO2: 98%  100% 100%   Weight change:   Intake/Output Summary (Last 24 hours) at 11/27/12 0736 Last data filed at 11/27/12 0609  Gross per 24 hour  Intake  646.9 ml  Output    200 ml  Net  446.9 ml    General: Alert, awake, oriented x3, in no acute distress Neck:  JVP is normal Heart: Regular rate and rhythm, without murmurs, rubs, gallops.  Lungs: Clear to auscultation.  No rales or wheezes. Exemities:  No edema.   Neuro: Grossly intact, nonfocal.   Lab Results: Results for orders placed during the hospital encounter of 11/26/12 (from the past 24 hour(s))  TROPONIN I     Status: None   Collection Time    11/26/12  9:04 AM      Result Value Range   Troponin I <0.30  <0.30 ng/mL  PROTIME-INR     Status: None   Collection Time    11/26/12  9:04 AM      Result Value Range   Prothrombin Time 12.9  11.6 - 15.2 seconds   INR 0.98  0.00 - 1.49  APTT     Status: Abnormal   Collection Time    11/26/12  9:04 AM      Result Value Range   aPTT 23 (*) 24 - 37 seconds  TSH     Status: None   Collection Time    11/26/12  9:04 AM      Result Value Range   TSH 2.343  0.350 - 4.500 uIU/mL  HEMOGLOBIN A1C     Status: Abnormal   Collection Time    11/26/12  9:04 AM      Result Value Range   Hemoglobin A1C 6.5 (*) <5.7 %   Mean Plasma Glucose 140 (*) <117 mg/dL  TROPONIN I     Status: None   Collection Time    11/26/12  2:15 PM      Result Value Range   Troponin I <0.30  <0.30 ng/mL  HEPARIN LEVEL (UNFRACTIONATED)     Status: None   Collection Time    11/26/12  5:07 PM      Result Value Range   Heparin Unfractionated 0.35  0.30 - 0.70 IU/mL  TROPONIN I     Status: None   Collection Time    11/26/12  8:55 PM      Result Value Range   Troponin I <0.30  <0.30 ng/mL  HEPARIN LEVEL (UNFRACTIONATED)     Status: Abnormal   Collection Time    11/26/12 11:46 PM      Result Value Range   Heparin Unfractionated 0.22 (*) 0.30 - 0.70 IU/mL  COMPREHENSIVE METABOLIC PANEL     Status: Abnormal   Collection Time    11/27/12  4:56 AM      Result Value Range   Sodium 139  135 - 145 mEq/L   Potassium 4.1  3.5 - 5.1 mEq/L   Chloride 102  96 - 112 mEq/L   CO2 28  19 - 32 mEq/L   Glucose,  Bld 150 (*) 70 - 99 mg/dL   BUN 12  6 - 23 mg/dL   Creatinine, Ser 1.61  0.50 - 1.35 mg/dL   Calcium 9.0  8.4 - 09.6 mg/dL   Total Protein 6.3  6.0 - 8.3 g/dL   Albumin 3.3 (*) 3.5 - 5.2 g/dL   AST 36  0 - 37 U/L   ALT 47  0 - 53 U/L   Alkaline Phosphatase 82  39 - 117 U/L   Total Bilirubin 0.5  0.3 - 1.2 mg/dL   GFR calc non Af Amer 81 (*) >90 mL/min   GFR calc Af Amer >90  >90 mL/min  CBC     Status: Abnormal   Collection Time    11/27/12  4:56 AM      Result Value Range   WBC 5.6  4.0 - 10.5 K/uL   RBC 4.94  4.22 - 5.81 MIL/uL   Hemoglobin 13.6  13.0 - 17.0 g/dL   HCT 04.5  40.9 - 81.1 %   MCV 81.4  78.0 - 100.0 fL   MCH 27.5  26.0 - 34.0 pg   MCHC 33.8  30.0 - 36.0 g/dL   RDW 91.4  78.2 - 95.6 %   Platelets 129 (*) 150 - 400 K/uL  LIPID PANEL     Status: Abnormal   Collection Time    11/27/12  4:56 AM      Result Value Range   Cholesterol 128  0 - 200 mg/dL   Triglycerides 213  <086 mg/dL   HDL 34 (*) >57 mg/dL   Total CHOL/HDL Ratio 3.8     VLDL 25  0 - 40 mg/dL   LDL Cholesterol 69  0 - 99 mg/dL  HEPARIN LEVEL (UNFRACTIONATED)     Status: None   Collection Time    11/27/12  4:56 AM      Result Value Range   Heparin Unfractionated 0.42  0.30 - 0.70 IU/mL    Studies/Results: @RISRSLT24 @  Medications: Reviewed.   @PROBHOSP @  1.  Atrial fibrillaton.  Patient back in SR  Feels great.  I have reivewed with J ALlred.  This is his first real episode of  prolonged afib.  Would not change regimen now.  He would like to see in clinic in 4 wks to see how doing. Stop heparin.  Continue ASA  Rx lopressor 25 prn if recurs.  2.  CP  Atypical  May be due to faster HR Feels great now.  Follow  I would not schedule stress testing for now  3.  Abnormal CT chest  Needs repeat in 4 wks to evaluate LUL irregular density.  If OK with ambulation then D/C today.  LOS: 1 day   Dietrich Pates 11/27/2012, 7:36 AM

## 2012-11-27 NOTE — Progress Notes (Signed)
Pt has ambulated x2; before lunch and again after lunch. Has tolerated well, no c/o chest pain nor SOB. Pt continues in SR, at rest HR in 50's, 60-70's ambulating in hallway on unit at a brisk pace. Tolerated well, anxious to go home today. Trish(card Child psychotherapist) paged, and PA Bjorn Loser will be rounding on pt soon.  Pt and wife notified.

## 2012-11-27 NOTE — Progress Notes (Signed)
ANTICOAGULATION CONSULT NOTE - Follow Up Consult  Pharmacy Consult for Heparin Indication: atrial fibrillation  Allergies  Allergen Reactions  . Oxycodone-Acetaminophen Itching and Rash    Patient Measurements: Height: 6' 0.83" (185 cm) Weight: 229 lb 4.5 oz (104 kg) IBW/kg (Calculated) : 79.52 Heparin Dosing Weight: 95kg  Vital Signs: Temp: 98.2 F (36.8 C) (03/30 2134) Temp src: Oral (03/30 2134) BP: 127/73 mmHg (03/30 2134) Pulse Rate: 68 (03/30 2134)  Labs:  Recent Labs  11/26/12 0451 11/26/12 0505 11/26/12 0904 11/26/12 1415 11/26/12 1707 11/26/12 2055 11/26/12 2346  HGB 14.2  --   --   --   --   --   --   HCT 41.5  --   --   --   --   --   --   PLT 138*  --   --   --   --   --   --   APTT  --   --  23*  --   --   --   --   LABPROT  --   --  12.9  --   --   --   --   INR  --   --  0.98  --   --   --   --   HEPARINUNFRC  --   --   --   --  0.35  --  0.22*  CREATININE 1.15  --   --   --   --   --   --   CKTOTAL  --  328*  --   --   --   --   --   CKMB  --  2.7  --   --   --   --   --   TROPONINI  --   --  <0.30 <0.30  --  <0.30  --     Estimated Creatinine Clearance: 93.8 ml/min (by C-G formula based on Cr of 1.15).   Medications:  Heparin 1400 units/hr  Assessment: 51yom on heparin for Afib. Heparin level (0.22) is subtherapeutic -  No issues noted with infusion  Goal of Therapy:  Heparin level 0.3-0.7 units/ml Monitor platelets by anticoagulation protocol: Yes   Plan:  1. Increase heparin to 1600 units/hr (16 ml/hr) 2. Check heparin level @ 0700  Talbert Cage [place] [phone] 11/27/2012,12:58 AM

## 2012-11-27 NOTE — Progress Notes (Addendum)
ANTICOAGULATION CONSULT NOTE - Follow Up Consult  Pharmacy Consult for Heparin Indication: atrial fibrillation  Allergies  Allergen Reactions  . Oxycodone-Acetaminophen Itching and Rash    Patient Measurements: Height: 6' 0.83" (185 cm) Weight: 229 lb 4.5 oz (104 kg) IBW/kg (Calculated) : 79.52 Heparin Dosing Weight: 95kg  Vital Signs: Temp: 98.2 F (36.8 C) (03/31 0607) Temp src: Oral (03/31 0607) BP: 125/64 mmHg (03/31 0607) Pulse Rate: 64 (03/31 0607)  Labs:  Recent Labs  11/26/12 0451 11/26/12 0505 11/26/12 0904 11/26/12 1415 11/26/12 1707 11/26/12 2055 11/26/12 2346 11/27/12 0456  HGB 14.2  --   --   --   --   --   --  13.6  HCT 41.5  --   --   --   --   --   --  40.2  PLT 138*  --   --   --   --   --   --  129*  APTT  --   --  23*  --   --   --   --   --   LABPROT  --   --  12.9  --   --   --   --   --   INR  --   --  0.98  --   --   --   --   --   HEPARINUNFRC  --   --   --   --  0.35  --  0.22* 0.42  CREATININE 1.15  --   --   --   --   --   --  1.03  CKTOTAL  --  328*  --   --   --   --   --   --   CKMB  --  2.7  --   --   --   --   --   --   TROPONINI  --   --  <0.30 <0.30  --  <0.30  --   --     Estimated Creatinine Clearance: 104.8 ml/min (by C-G formula based on Cr of 1.03).   Medications:  Heparin 1600 units/hr  Assessment: 51yom on heparin for Afib. Heparin level (0.42) is therapeutic. Hg/Hct= 13.6/40.2 with plt=129 (stable compared to admit). Patient noted for possible ablation.  Goal of Therapy:  Heparin level 0.3-0.7 units/ml Monitor platelets by anticoagulation protocol: Yes   Plan:  -no heparin changes needed -Heparin level and CBC in am  Harland German, Pharm D 11/27/2012 7:51 AM

## 2012-12-01 ENCOUNTER — Encounter: Payer: Self-pay | Admitting: Internal Medicine

## 2012-12-01 ENCOUNTER — Ambulatory Visit (INDEPENDENT_AMBULATORY_CARE_PROVIDER_SITE_OTHER): Payer: PRIVATE HEALTH INSURANCE | Admitting: Internal Medicine

## 2012-12-01 VITALS — BP 139/88 | HR 54 | Ht 67.0 in | Wt 223.0 lb

## 2012-12-01 DIAGNOSIS — I4891 Unspecified atrial fibrillation: Secondary | ICD-10-CM

## 2012-12-01 MED ORDER — FLECAINIDE ACETATE 150 MG PO TABS: ORAL_TABLET | ORAL | Status: DC

## 2012-12-01 NOTE — Patient Instructions (Signed)
Your physician recommends that you schedule a follow-up appointment in: 3 months with Dr Johney Frame   Your physician has recommended you make the following change in your medication:  I) If HR is > I00 Take Metoprolol 2) Start Flecainide 150mg    Take 2 as needed ---pill in the pocket for afib

## 2012-12-02 NOTE — Progress Notes (Signed)
PCP: Jerl Mina, MD  Jorge Peterson is a 52 y.o. male who presents today for routine electrophysiology followup.  He recently presented with symptoms of recurrent afib.  He reports that his afib lasting 48-72 hours and then resolved.  He has done well since without any further episodes.   He denies chest pain or exertional symptoms presently and is quite active.  Today, he denies symptoms of palpitations, chest pain, shortness of breath,  lower extremity edema, dizziness, presyncope, or syncope.  The patient is otherwise without complaint today.   Past Medical History  Diagnosis Date  . Atrial fibrillation     s/p afib ablation 7/12  . Hyperlipidemia   . Bradycardia     asymptomatic  . Diverticulitis   . Hypertension   . Shortness of breath   . GERD (gastroesophageal reflux disease)   . Headache    Past Surgical History  Procedure Laterality Date  . Shoulder surgery      x 2  . Back surgery    . Atrial ablation surgery  03/24/11    PVI by JA  . Fistula surgery      Current Outpatient Prescriptions  Medication Sig Dispense Refill  . aspirin EC 81 MG tablet Take 81 mg by mouth daily.      [name] CINNAMON PO Take 1 tablet by mouth daily.      . Coenzyme Q10 (COQ10) 150 MG CAPS Take 1 capsule by mouth daily.       . fish oil-omega-3 fatty acids 1000 MG capsule Take 1 g by mouth daily.       . folic acid (FOLVITE) 400 MCG tablet Take 400 mcg by mouth daily.        [name] losartan (COZAAR) 50 MG tablet Take 50 mg by mouth daily.      . metoprolol tartrate (LOPRESSOR) 25 MG tablet Take 1 tablet (25 mg total) by mouth 2 (two) times daily as needed. For palpitations. Okay to take as long as heart rate is greater than 50.  60 tablet  11  . Multiple Vitamin (MULTIVITAMIN) tablet Take 1 tablet by mouth daily.       [name] omeprazole (PRILOSEC) 40 MG capsule Take 40 mg by mouth daily.       . Probiotic Product (PROBIOTIC FORMULA PO) Take 1 capsule by mouth daily.      . simvastatin (ZOCOR) 40 MG  tablet Take 40 mg by mouth at bedtime.       [name] zolpidem (AMBIEN) 5 MG tablet Take 1 tablet (5 mg total) by mouth at bedtime as needed for sleep.  10 tablet  0  . flecainide (TAMBOCOR) 150 MG tablet Take as directed for afib  10 tablet  3   No current facility-administered medications for this visit.    Physical Exam: Filed Vitals:   12/01/12 1000  BP: 139/88  Pulse: 54  Height: 5\' 7"  (1.702 m)  Weight: 223 lb (101.152 kg)    GEN- The patient is well appearing, alert and oriented x 3 today.   Head- normocephalic, atraumatic Eyes-  Sclera clear, conjunctiva pink Ears- hearing intact Oropharynx- clear Lungs- Clear to ausculation bilaterally, normal work of breathing Heart- Regular rate and rhythm, no murmurs, rubs or gallops, PMI not laterally displaced GI- soft, NT, ND, + BS Extremities- no clubbing, cyanosis, or edema  ekg today reveals sinus bradycardia 54 bpm, PR 214, LVH  Assessment and Plan:  1. afib Doing well presently Will treat with flecainide "pill in the  pocket" approach going forward.  If his afib burden increases then we may have to consider repeat catheter ablation.  He would like to avoid the procedure or daily antiarrhythmic therapy at this time.

## 2012-12-14 ENCOUNTER — Ambulatory Visit: Payer: PRIVATE HEALTH INSURANCE | Admitting: Internal Medicine

## 2012-12-16 ENCOUNTER — Telehealth: Payer: Self-pay | Admitting: Gastroenterology

## 2012-12-16 MED ORDER — AMOXICILLIN-POT CLAVULANATE 875-125 MG PO TABS: 1.0000 | ORAL_TABLET | Freq: Two times a day (BID) | ORAL | Status: DC

## 2012-12-16 NOTE — Telephone Encounter (Signed)
C/o lower abdominal pain typical of pain associated with acute diverticulitis.  Pain worsening over past 2 days.  Will call in Augmentin. I instructed the patient to call back in the next 48 hours if he is not improved.

## 2012-12-28 ENCOUNTER — Other Ambulatory Visit: Payer: Self-pay | Admitting: Family Medicine

## 2012-12-28 ENCOUNTER — Other Ambulatory Visit (HOSPITAL_COMMUNITY): Payer: Self-pay | Admitting: Family Medicine

## 2012-12-28 DIAGNOSIS — R911 Solitary pulmonary nodule: Secondary | ICD-10-CM

## 2012-12-28 DIAGNOSIS — R9389 Abnormal findings on diagnostic imaging of other specified body structures: Secondary | ICD-10-CM

## 2013-01-03 ENCOUNTER — Other Ambulatory Visit: Payer: PRIVATE HEALTH INSURANCE

## 2013-01-08 ENCOUNTER — Ambulatory Visit
Admission: RE | Admit: 2013-01-08 | Discharge: 2013-01-08 | Disposition: A | Discharge status: Home / Self Care | Payer: PRIVATE HEALTH INSURANCE | Source: Ambulatory Visit | Attending: Family Medicine | Admitting: Family Medicine

## 2013-01-08 ENCOUNTER — Other Ambulatory Visit: Payer: PRIVATE HEALTH INSURANCE

## 2013-01-08 DIAGNOSIS — R9389 Abnormal findings on diagnostic imaging of other specified body structures: Secondary | ICD-10-CM

## 2013-01-08 MED ORDER — IOHEXOL 300 MG/ML  SOLN
75.0000 mL | Freq: Once | INTRAMUSCULAR | Status: AC | PRN
  Administered 2013-01-08: 75 mL via INTRAVENOUS

## 2013-01-10 ENCOUNTER — Other Ambulatory Visit (HOSPITAL_COMMUNITY): Payer: Self-pay | Admitting: Family Medicine

## 2013-01-10 DIAGNOSIS — R911 Solitary pulmonary nodule: Secondary | ICD-10-CM

## 2013-01-15 ENCOUNTER — Encounter (HOSPITAL_COMMUNITY)
Admission: RE | Admit: 2013-01-15 | Discharge: 2013-01-15 | Disposition: A | Discharge status: Home / Self Care | Payer: PRIVATE HEALTH INSURANCE | Source: Ambulatory Visit | Attending: Family Medicine | Admitting: Family Medicine

## 2013-01-15 DIAGNOSIS — R911 Solitary pulmonary nodule: Secondary | ICD-10-CM | POA: Insufficient documentation

## 2013-01-15 LAB — GLUCOSE, CAPILLARY: Glucose-Capillary: 121 mg/dL — ABNORMAL HIGH (ref 70–99)

## 2013-01-15 MED ORDER — FLUDEOXYGLUCOSE F - 18 (FDG) INJECTION
19.0000 | Freq: Once | INTRAVENOUS | Status: AC | PRN
  Administered 2013-01-15: 19 via INTRAVENOUS

## 2013-01-18 ENCOUNTER — Encounter: Payer: Self-pay | Admitting: Cardiothoracic Surgery

## 2013-01-18 ENCOUNTER — Other Ambulatory Visit: Payer: Self-pay | Admitting: *Deleted

## 2013-01-18 ENCOUNTER — Institutional Professional Consult (permissible substitution) (INDEPENDENT_AMBULATORY_CARE_PROVIDER_SITE_OTHER): Payer: PRIVATE HEALTH INSURANCE | Admitting: Cardiothoracic Surgery

## 2013-01-18 VITALS — BP 154/89 | HR 63 | Resp 20 | Ht 67.0 in

## 2013-01-18 DIAGNOSIS — R911 Solitary pulmonary nodule: Secondary | ICD-10-CM

## 2013-01-18 DIAGNOSIS — J984 Other disorders of lung: Secondary | ICD-10-CM

## 2013-01-18 DIAGNOSIS — R599 Enlarged lymph nodes, unspecified: Secondary | ICD-10-CM

## 2013-01-18 DIAGNOSIS — R590 Localized enlarged lymph nodes: Secondary | ICD-10-CM

## 2013-01-18 NOTE — Progress Notes (Signed)
PCP is Jerl Mina, MD Referring Provider is Jerl Mina, MD  Chief Complaint  Patient presents with  . Lung Lesion    evaL and treat...CTA/CT CHEST WITH CONTRAST/PET    HPI: 52 year old Caucasian male nonsmoker with borderline diabetes recently diagnosed with a 1.8 cm left upper lobe nodular mass which grew slightly on serial scans from March until May. A PET scan was then performed which demonstrated that the left upper lobe peripheral lobular mass had a mild hypermetabolic activity however there were left hilar and left peritracheal lymph nodes with extremely high activity on PET scan. The patient has had nonproductive cough, hoarseness for the past several weeks. He had an ENT exam-laryngoscopy and was told that the left vocal cord musculature appeared to be weakened. His last chest x-ray was approximatley which was unremarkable.  The patient has a history of paroxysmal atrial fibrillation followed by Dr. Johney Frame and status post atrial ablation in 2012. He is currently off all anticoagulation except for aspirin and is off flecainide and metoprolol.  PET scan showed no other distant areas of potential metastatic disease. The patient has not had a brain MRI or head CT scan.  There is no family history of lung cancer. However his 47 year old daughter has been treated for brain cancer 3 years ago at [facility]. His wife has recently been diagnosed with a pulmonary nodule and is pending a CT scan of chest.  The patient has not had a history of COPD, chronic bronchitis or exposure to toxic gas or carcinogens. He works as a Optician, dispensing.  Past Medical History  Diagnosis Date  . Atrial fibrillation     s/p afib ablation 7/12  . Hyperlipidemia   . Bradycardia     asymptomatic  . Diverticulitis   . Hypertension   . Shortness of breath   . GERD (gastroesophageal reflux disease)   . Headache     Past Surgical History  Procedure Laterality Date  . Shoulder surgery      x 2  . Back surgery    .  Atrial ablation surgery  03/24/11    PVI by JA  . Fistula surgery      Family History  Problem Relation Age of Onset  . Diabetes Father   . Heart disease Father     CABG at 78    Social History History  Substance Use Topics  . Smoking status: Never Smoker   . Smokeless tobacco: Never Used  . Alcohol Use: No    Current Outpatient Prescriptions  Medication Sig Dispense Refill  . aspirin EC 81 MG tablet Take 81 mg by mouth daily.      . Biotin 1000 MCG tablet Take 1,000 mcg by mouth daily.      . Coenzyme Q10 (COQ10) 150 MG CAPS Take 1 capsule by mouth daily.       . fish oil-omega-3 fatty acids 1000 MG capsule Take 1 g by mouth daily.       . folic acid (FOLVITE) 400 MCG tablet Take 400 mcg by mouth daily.        [name] losartan (COZAAR) 50 MG tablet Take 50 mg by mouth daily.      . Multiple Vitamin (MULTIVITAMIN) tablet Take 1 tablet by mouth daily.       [name] omeprazole (PRILOSEC) 40 MG capsule Take 40 mg by mouth daily.       . Probiotic Product (PROBIOTIC FORMULA PO) Take 1 capsule by mouth daily.      . simvastatin (ZOCOR)  40 MG tablet Take 40 mg by mouth at bedtime.       . traZODone (DESYREL) 50 MG tablet Take 50 mg by mouth at bedtime.        No current facility-administered medications for this visit.    Allergies  Allergen Reactions  . Oxycodone-Acetaminophen Itching and Rash    Review of Systems  Gen. no weight loss or fever HEENT no change in vision or dental complaints positive hoarseness Thorax no history of trauma positive for left upper lobe 2 cm nodular density and hypermetabolic left hilar and left mediastinal lymph nodes on PET scan Cardiac history of atrial fibrillation current sinus rhythm, recent 2-D echo shows normal LV ejection fraction GI history of diverticular disease history of anal fistula Endocrine borderline diabetes hemoglobin A1c 6.5 Neurologic no history stroke or seizure Hematologic no bleeding disorder or blood transfusion Vascular  negative for DVT claudication TIA  BP 154/89  Pulse 63  Resp 20  Ht 5\' 7"  (1.702 m)  SpO2 98%   Physical Exam General alert comfortable Caucasian male coming by wife HEENT normocephalic pupils equal dentition good Neck without JVD mass or adenopathy or bruit Thorax no deformity or tenderness breath sounds clear bilaterally Cardiac regular rhythm without murmur or gallop Abdomen soft obese nontender Extremities no cyanosis tenderness edema Vascular positive pulses in all extremities Neurologic no focal motor deficit   Diagnostic Tests: CT scan, PET scan reviewed with patient. 2 cm left upper lobe peripheral nodule with hypermetabolic nodal tissue at the left mainstem bronchus and in the left hilum  Impression: Clinically suspicious for stage III carcinoma lung  Plan:Proceed with bronchoscopy, ultrasound directed aspiration of left peritracheal nodes-EBUS. If that is non-diagnostic then mediastinoscopy will be performed while the patient is still under anesthesia.. Both these procedures discussed in detail the patient including indications alternatives risks and expected recovery period surgery be scheduled for may the 27th at [facility].

## 2013-01-19 ENCOUNTER — Encounter (HOSPITAL_COMMUNITY): Payer: Self-pay | Admitting: Pharmacy Technician

## 2013-01-19 ENCOUNTER — Encounter (HOSPITAL_COMMUNITY)
Admission: RE | Admit: 2013-01-19 | Discharge: 2013-01-19 | Disposition: A | Discharge status: Home / Self Care | Payer: PRIVATE HEALTH INSURANCE | Source: Ambulatory Visit | Attending: Cardiothoracic Surgery | Admitting: Cardiothoracic Surgery

## 2013-01-19 ENCOUNTER — Encounter (HOSPITAL_COMMUNITY): Payer: Self-pay

## 2013-01-19 VITALS — BP 145/83 | HR 57 | Temp 98.3°F | Resp 20 | Ht 67.0 in | Wt 220.0 lb

## 2013-01-19 DIAGNOSIS — R911 Solitary pulmonary nodule: Secondary | ICD-10-CM

## 2013-01-19 LAB — CBC
HCT: 43 % (ref 39.0–52.0)
Hemoglobin: 14.5 g/dL (ref 13.0–17.0)
MCH: 27.6 pg (ref 26.0–34.0)
MCHC: 33.7 g/dL (ref 30.0–36.0)
MCV: 81.9 fL (ref 78.0–100.0)
Platelets: 189 10*3/uL (ref 150–400)
RBC: 5.25 MIL/uL (ref 4.22–5.81)
RDW: 13 % (ref 11.5–15.5)
WBC: 8.2 10*3/uL (ref 4.0–10.5)

## 2013-01-19 LAB — PROTIME-INR
INR: 0.92 (ref 0.00–1.49)
Prothrombin Time: 12.3 seconds (ref 11.6–15.2)

## 2013-01-19 LAB — SURGICAL PCR SCREEN: MRSA, PCR: NEGATIVE

## 2013-01-19 LAB — APTT: aPTT: 26 seconds (ref 24–37)

## 2013-01-19 LAB — COMPREHENSIVE METABOLIC PANEL
ALT: 31 U/L (ref 0–53)
AST: 26 U/L (ref 0–37)
Albumin: 3.8 g/dL (ref 3.5–5.2)
Alkaline Phosphatase: 90 U/L (ref 39–117)
BUN: 15 mg/dL (ref 6–23)
CO2: 24 mEq/L (ref 19–32)
Calcium: 9.6 mg/dL (ref 8.4–10.5)
Chloride: 104 mEq/L (ref 96–112)
Creatinine, Ser: 1.01 mg/dL (ref 0.50–1.35)
GFR calc Af Amer: >90 mL/min (ref >90)
GFR calc non Af Amer: 83 mL/min — ABNORMAL LOW (ref >90)
Glucose, Bld: 127 mg/dL — ABNORMAL HIGH (ref 70–99)
Potassium: 4.3 mEq/L (ref 3.5–5.1)
Sodium: 141 mEq/L (ref 135–145)
Total Bilirubin: 0.6 mg/dL (ref 0.3–1.2)
Total Protein: 7.1 g/dL (ref 6.0–8.3)

## 2013-01-19 LAB — TYPE AND SCREEN
ABO/RH(D): O POS
Antibody Screen: NEGATIVE

## 2013-01-19 NOTE — Pre-Procedure Instructions (Signed)
Jorge Peterson  01/19/2013    Your procedure is scheduled on:  Tuesday, May 27th   Report to [facility] at  5:30 AM.  Call this number if you have problems the morning of surgery: [phone]   Remember:   Do not eat food or drink liquids after midnight Monday.   Take these medicines the morning of surgery with A SIP OF WATER: Omeprazole   Do not wear jewelry.  Do not wear lotions, powders, or colognes. You may NOT  wear deodorant.   Men may shave face and neck.   Do not bring valuables to the hospital.  Contacts, dentures or bridgework may not be worn into surgery.   Leave suitcase in the car. After surgery it may be brought to your room.  For patients admitted to the hospital, checkout time is 11:00 AM the day of discharge.   Patients discharged the day of surgery will not be allowed to drive home.   Name and phone number of your driver:   Special Instructions: Shower using CHG 2 nights before surgery and the night before surgery.  If you shower the day of surgery use CHG.  Use special wash - you have one bottle of CHG for all showers.  You should use approximately 1/3 of the bottle for each shower.   Please read over the following fact sheets that you were given: Pain Booklet, MRSA Information and Surgical Site Infection Prevention

## 2013-01-22 MED ORDER — CEFUROXIME SODIUM 1.5 G IJ SOLR
1.5000 g | INTRAMUSCULAR | Status: AC
  Administered 2013-01-23: 1.5 g via INTRAVENOUS
  Filled 2013-01-22: qty 1.5

## 2013-01-23 ENCOUNTER — Ambulatory Visit (HOSPITAL_COMMUNITY): Payer: PRIVATE HEALTH INSURANCE | Admitting: Anesthesiology

## 2013-01-23 ENCOUNTER — Ambulatory Visit (HOSPITAL_COMMUNITY)
Admission: RE | Admit: 2013-01-23 | Discharge: 2013-01-24 | Disposition: A | Discharge status: Home / Self Care | Payer: PRIVATE HEALTH INSURANCE | Source: Ambulatory Visit | Attending: Cardiothoracic Surgery | Admitting: Cardiothoracic Surgery

## 2013-01-23 ENCOUNTER — Encounter (HOSPITAL_COMMUNITY): Payer: Self-pay | Admitting: Anesthesiology

## 2013-01-23 ENCOUNTER — Ambulatory Visit (HOSPITAL_COMMUNITY): Payer: PRIVATE HEALTH INSURANCE

## 2013-01-23 ENCOUNTER — Encounter (HOSPITAL_COMMUNITY): Payer: Self-pay | Admitting: *Deleted

## 2013-01-23 ENCOUNTER — Encounter (HOSPITAL_COMMUNITY)
Admission: RE | Disposition: A | Discharge status: Home / Self Care | Payer: Self-pay | Source: Ambulatory Visit | Attending: Cardiothoracic Surgery

## 2013-01-23 DIAGNOSIS — E785 Hyperlipidemia, unspecified: Secondary | ICD-10-CM | POA: Insufficient documentation

## 2013-01-23 DIAGNOSIS — M25519 Pain in unspecified shoulder: Secondary | ICD-10-CM | POA: Insufficient documentation

## 2013-01-23 DIAGNOSIS — R942 Abnormal results of pulmonary function studies: Secondary | ICD-10-CM | POA: Insufficient documentation

## 2013-01-23 DIAGNOSIS — Z01818 Encounter for other preprocedural examination: Secondary | ICD-10-CM | POA: Insufficient documentation

## 2013-01-23 DIAGNOSIS — I10 Essential (primary) hypertension: Secondary | ICD-10-CM | POA: Insufficient documentation

## 2013-01-23 DIAGNOSIS — I498 Other specified cardiac arrhythmias: Secondary | ICD-10-CM | POA: Insufficient documentation

## 2013-01-23 DIAGNOSIS — I4891 Unspecified atrial fibrillation: Secondary | ICD-10-CM | POA: Insufficient documentation

## 2013-01-23 DIAGNOSIS — D72829 Elevated white blood cell count, unspecified: Secondary | ICD-10-CM | POA: Insufficient documentation

## 2013-01-23 DIAGNOSIS — D381 Neoplasm of uncertain behavior of trachea, bronchus and lung: Secondary | ICD-10-CM

## 2013-01-23 DIAGNOSIS — R911 Solitary pulmonary nodule: Secondary | ICD-10-CM | POA: Insufficient documentation

## 2013-01-23 DIAGNOSIS — Z9889 Other specified postprocedural states: Secondary | ICD-10-CM

## 2013-01-23 DIAGNOSIS — Z01812 Encounter for preprocedural laboratory examination: Secondary | ICD-10-CM | POA: Insufficient documentation

## 2013-01-23 DIAGNOSIS — K5732 Diverticulitis of large intestine without perforation or abscess without bleeding: Secondary | ICD-10-CM | POA: Insufficient documentation

## 2013-01-23 DIAGNOSIS — R7309 Other abnormal glucose: Secondary | ICD-10-CM | POA: Insufficient documentation

## 2013-01-23 DIAGNOSIS — R072 Precordial pain: Secondary | ICD-10-CM | POA: Insufficient documentation

## 2013-01-23 HISTORY — DX: Family history of other specified conditions: Z84.89

## 2013-01-23 HISTORY — PX: BRONCHOSCOPY: SUR163

## 2013-01-23 HISTORY — PX: VIDEO BRONCHOSCOPY WITH ENDOBRONCHIAL ULTRASOUND: SHX6177

## 2013-01-23 HISTORY — PX: MEDIASTINOSCOPY: SHX5086

## 2013-01-23 LAB — BLOOD GAS, ARTERIAL
Acid-Base Excess: 0.1 mmol/L (ref 0.0–2.0)
Bicarbonate: 24.8 mEq/L — ABNORMAL HIGH (ref 20.0–24.0)
O2 Content: 3 L/min
O2 Saturation: 96.8 %
Patient temperature: 97.4
TCO2: 26.2 mmol/L (ref 0–100)
pCO2 arterial: 43.5 mmHg (ref 35.0–45.0)
pH, Arterial: 7.371 (ref 7.350–7.450)
pO2, Arterial: 91 mmHg (ref 80.0–100.0)

## 2013-01-23 LAB — GLUCOSE, CAPILLARY: Glucose-Capillary: 143 mg/dL — ABNORMAL HIGH (ref 70–99)

## 2013-01-23 SURGERY — BRONCHOSCOPY, WITH EBUS
Anesthesia: General | Site: Neck | Wound class: Clean Contaminated

## 2013-01-23 MED ORDER — POTASSIUM CHLORIDE 10 MEQ/50ML IV SOLN
10.0000 meq | Freq: Every day | INTRAVENOUS | Status: DC | PRN
  Filled 2013-01-23: qty 50

## 2013-01-23 MED ORDER — HYDROMORPHONE HCL PF 1 MG/ML IJ SOLN: 0.2500 mg | INTRAMUSCULAR | Status: DC | PRN

## 2013-01-23 MED ORDER — NEOSTIGMINE METHYLSULFATE 1 MG/ML IJ SOLN
INTRAMUSCULAR | Status: DC | PRN
  Administered 2013-01-23: 4 mg via INTRAVENOUS

## 2013-01-23 MED ORDER — OXYCODONE HCL 5 MG PO TABS
5.0000 mg | ORAL_TABLET | Freq: Once | ORAL | Status: DC | PRN
Start: 2013-01-23 — End: 2013-01-23

## 2013-01-23 MED ORDER — 0.9 % SODIUM CHLORIDE (POUR BTL) OPTIME
TOPICAL | Status: DC | PRN
  Administered 2013-01-23 (×2): 1000 mL

## 2013-01-23 MED ORDER — OXYCODONE HCL 5 MG/5ML PO SOLN: 5.0000 mg | Freq: Once | ORAL | Status: DC | PRN

## 2013-01-23 MED ORDER — HEMOSTATIC AGENTS (NO CHARGE) OPTIME
TOPICAL | Status: DC | PRN
  Administered 2013-01-23: 1 via TOPICAL

## 2013-01-23 MED ORDER — FENTANYL CITRATE 0.05 MG/ML IJ SOLN
25.0000 ug | INTRAMUSCULAR | Status: DC | PRN
  Administered 2013-01-23: 50 ug via INTRAVENOUS
  Filled 2013-01-23: qty 2

## 2013-01-23 MED ORDER — TRAMADOL HCL 50 MG PO TABS
50.0000 mg | ORAL_TABLET | Freq: Four times a day (QID) | ORAL | Status: DC | PRN
  Administered 2013-01-23: 50 mg via ORAL
  Administered 2013-01-23: 100 mg via ORAL
  Administered 2013-01-23: 50 mg via ORAL
  Administered 2013-01-24: 100 mg via ORAL
  Filled 2013-01-23: qty 2
  Filled 2013-01-23: qty 1
  Filled 2013-01-23 (×2): qty 2

## 2013-01-23 MED ORDER — LOSARTAN POTASSIUM 50 MG PO TABS
50.0000 mg | ORAL_TABLET | Freq: Every day | ORAL | Status: DC
  Filled 2013-01-23: qty 1

## 2013-01-23 MED ORDER — DEXTROSE 5 % IV SOLN
1.5000 g | Freq: Once | INTRAVENOUS | Status: AC
  Administered 2013-01-23: 1.5 g via INTRAVENOUS
  Filled 2013-01-23: qty 1.5

## 2013-01-23 MED ORDER — HYDROMORPHONE HCL PF 1 MG/ML IJ SOLN
INTRAMUSCULAR | Status: AC
  Administered 2013-01-23: 0.5 mg via INTRAVENOUS
  Filled 2013-01-23: qty 1

## 2013-01-23 MED ORDER — ONDANSETRON HCL 4 MG/2ML IJ SOLN: 4.0000 mg | Freq: Four times a day (QID) | INTRAMUSCULAR | Status: DC | PRN

## 2013-01-23 MED ORDER — ROCURONIUM BROMIDE 100 MG/10ML IV SOLN
INTRAVENOUS | Status: DC | PRN
  Administered 2013-01-23: 15 mg via INTRAVENOUS
  Administered 2013-01-23: 10 mg via INTRAVENOUS
  Administered 2013-01-23: 50 mg via INTRAVENOUS
  Administered 2013-01-23: 5 mg via INTRAVENOUS

## 2013-01-23 MED ORDER — SIMVASTATIN 40 MG PO TABS
40.0000 mg | ORAL_TABLET | Freq: Every day | ORAL | Status: DC
  Administered 2013-01-23: 40 mg via ORAL
  Filled 2013-01-23 (×2): qty 1

## 2013-01-23 MED ORDER — ONDANSETRON HCL 4 MG/2ML IJ SOLN
INTRAMUSCULAR | Status: DC | PRN
  Administered 2013-01-23: 4 mg via INTRAVENOUS

## 2013-01-23 MED ORDER — LIDOCAINE HCL (CARDIAC) 20 MG/ML IV SOLN
INTRAVENOUS | Status: DC | PRN
  Administered 2013-01-23: 100 mg via INTRAVENOUS

## 2013-01-23 MED ORDER — MIDAZOLAM HCL 5 MG/5ML IJ SOLN
INTRAMUSCULAR | Status: DC | PRN
  Administered 2013-01-23: 2 mg via INTRAVENOUS

## 2013-01-23 MED ORDER — POTASSIUM CHLORIDE IN NACL 20-0.45 MEQ/L-% IV SOLN
INTRAVENOUS | Status: DC
  Administered 2013-01-23: 16:00:00 via INTRAVENOUS
  Filled 2013-01-23 (×3): qty 1000

## 2013-01-23 MED ORDER — PROPOFOL 10 MG/ML IV BOLUS
INTRAVENOUS | Status: DC | PRN
  Administered 2013-01-23: 200 mg via INTRAVENOUS

## 2013-01-23 MED ORDER — FENTANYL CITRATE 0.05 MG/ML IJ SOLN
INTRAMUSCULAR | Status: DC | PRN
  Administered 2013-01-23 (×2): 100 ug via INTRAVENOUS
  Administered 2013-01-23 (×3): 50 ug via INTRAVENOUS

## 2013-01-23 MED ORDER — ACETAMINOPHEN 500 MG PO TABS: 1000.0000 mg | ORAL_TABLET | Freq: Four times a day (QID) | ORAL | Status: DC | PRN

## 2013-01-23 MED ORDER — SENNOSIDES-DOCUSATE SODIUM 8.6-50 MG PO TABS
1.0000 | ORAL_TABLET | Freq: Every evening | ORAL | Status: DC | PRN
  Filled 2013-01-23: qty 1

## 2013-01-23 MED ORDER — GLYCOPYRROLATE 0.2 MG/ML IJ SOLN
INTRAMUSCULAR | Status: DC | PRN
  Administered 2013-01-23: 0.6 mg via INTRAVENOUS

## 2013-01-23 MED ORDER — EPHEDRINE SULFATE 50 MG/ML IJ SOLN
INTRAMUSCULAR | Status: DC | PRN
  Administered 2013-01-23 (×3): 5 mg via INTRAVENOUS

## 2013-01-23 MED ORDER — PANTOPRAZOLE SODIUM 40 MG PO TBEC: 40.0000 mg | DELAYED_RELEASE_TABLET | Freq: Every day | ORAL | Status: DC

## 2013-01-23 MED ORDER — BISACODYL 5 MG PO TBEC: 10.0000 mg | DELAYED_RELEASE_TABLET | Freq: Every day | ORAL | Status: DC

## 2013-01-23 MED ORDER — LACTATED RINGERS IV SOLN
INTRAVENOUS | Status: DC | PRN
  Administered 2013-01-23 (×2): via INTRAVENOUS

## 2013-01-23 MED ORDER — TRAZODONE HCL 50 MG PO TABS
50.0000 mg | ORAL_TABLET | Freq: Every evening | ORAL | Status: DC | PRN
  Administered 2013-01-23: 50 mg via ORAL
  Filled 2013-01-23 (×2): qty 1

## 2013-01-23 SURGICAL SUPPLY — 65 items
ADH SKN CLS APL DERMABOND .7 (GAUZE/BANDAGES/DRESSINGS) ×2
BALL CTTN LRG ABS STRL LF (GAUZE/BANDAGES/DRESSINGS)
BLADE SURG 10 STRL SS (BLADE) ×4 IMPLANT
BRUSH CYTOL CELLEBRITY 1.5X140 (MISCELLANEOUS) ×3 IMPLANT
CANISTER SUCTION 2500CC (MISCELLANEOUS) ×8 IMPLANT
CLIP TI MEDIUM 6 (CLIP) IMPLANT
CLIP TI WIDE RED SMALL 6 (CLIP) ×6 IMPLANT
CLOTH BEACON ORANGE TIMEOUT ST (SAFETY) ×8 IMPLANT
CONT SPEC 4OZ CLIKSEAL STRL BL (MISCELLANEOUS) ×12 IMPLANT
COTTONBALL LRG STERILE PKG (GAUZE/BANDAGES/DRESSINGS) IMPLANT
COVER SURGICAL LIGHT HANDLE (MISCELLANEOUS) ×8 IMPLANT
COVER TABLE BACK 60X90 (DRAPES) ×4 IMPLANT
DERMABOND ADVANCED (GAUZE/BANDAGES/DRESSINGS) ×2
DERMABOND ADVANCED .7 DNX12 (GAUZE/BANDAGES/DRESSINGS) ×2 IMPLANT
DRAPE LAPAROTOMY T 102X78X121 (DRAPES) ×4 IMPLANT
ELECT BLADE 4.0 EZ CLEAN MEGAD (MISCELLANEOUS) ×4
ELECT CAUTERY BLADE 6.4 (BLADE) ×4 IMPLANT
ELECT REM PT RETURN 9FT ADLT (ELECTROSURGICAL) ×4
ELECTRODE BLDE 4.0 EZ CLN MEGD (MISCELLANEOUS) IMPLANT
ELECTRODE REM PT RTRN 9FT ADLT (ELECTROSURGICAL) ×2 IMPLANT
FORCEPS BIOP RJ4 1.8 (CUTTING FORCEPS) IMPLANT
GAUZE SPONGE 4X4 16PLY XRAY LF (GAUZE/BANDAGES/DRESSINGS) ×4 IMPLANT
GLOVE BIO SURGEON STRL SZ7.5 (GLOVE) ×14 IMPLANT
GLOVE SURG SS PI 7.5 STRL IVOR (GLOVE) ×6 IMPLANT
GOWN STRL NON-REIN LRG LVL3 (GOWN DISPOSABLE) ×8 IMPLANT
HEMOSTAT SURGICEL 2X14 (HEMOSTASIS) IMPLANT
KIT BASIN OR (CUSTOM PROCEDURE TRAY) ×4 IMPLANT
KIT ROOM TURNOVER OR (KITS) ×8 IMPLANT
MARKER SKIN DUAL TIP RULER LAB (MISCELLANEOUS) ×8 IMPLANT
NDL BIOPSY TRANSBRONCH 21G (NEEDLE) IMPLANT
NDL BLUNT 18X1 FOR OR ONLY (NEEDLE) IMPLANT
NEEDLE 22X1 1/2 (OR ONLY) (NEEDLE) IMPLANT
NEEDLE BIOPSY TRANSBRONCH 21G (NEEDLE) IMPLANT
NEEDLE BLUNT 18X1 FOR OR ONLY (NEEDLE) IMPLANT
NEEDLE SYS SONOTIP II EBUSTBNA (NEEDLE) ×6 IMPLANT
NS IRRIG 1000ML POUR BTL (IV SOLUTION) ×8 IMPLANT
OIL SILICONE PENTAX (PARTS (SERVICE/REPAIRS)) ×4 IMPLANT
PACK SURGICAL SETUP 50X90 (CUSTOM PROCEDURE TRAY) ×4 IMPLANT
PAD ARMBOARD 7.5X6 YLW CONV (MISCELLANEOUS) ×16 IMPLANT
PENCIL BUTTON HOLSTER BLD 10FT (ELECTRODE) ×4 IMPLANT
SOLUTION ANTI FOG 6CC (MISCELLANEOUS) ×3 IMPLANT
SPONGE GAUZE 4X4 12PLY (GAUZE/BANDAGES/DRESSINGS) ×7 IMPLANT
SPONGE INTESTINAL PEANUT (DISPOSABLE) IMPLANT
STAPLER VISISTAT 35W (STAPLE) IMPLANT
SUT SILK 2 0 TIES 10X30 (SUTURE) IMPLANT
SUT VIC AB 2-0 CT1 27 (SUTURE) ×4
SUT VIC AB 2-0 CT1 TAPERPNT 27 (SUTURE) ×2 IMPLANT
SUT VIC AB 3-0 SH 27 (SUTURE) ×4
SUT VIC AB 3-0 SH 27X BRD (SUTURE) ×2 IMPLANT
SUT VIC AB 3-0 SH 8-18 (SUTURE) ×7 IMPLANT
SUT VIC AB 3-0 X1 27 (SUTURE) ×4 IMPLANT
SWAB COLLECTION DEVICE MRSA (MISCELLANEOUS) IMPLANT
SYR 20CC LL (SYRINGE) ×4 IMPLANT
SYR 20ML ECCENTRIC (SYRINGE) ×4 IMPLANT
SYR 5ML LUER SLIP (SYRINGE) ×4 IMPLANT
SYR BULB IRRIGATION 50ML (SYRINGE) ×2 IMPLANT
SYR CONTROL 10ML LL (SYRINGE) IMPLANT
SYRINGE 10CC LL (SYRINGE) ×4 IMPLANT
TOWEL OR 17X24 6PK STRL BLUE (TOWEL DISPOSABLE) ×8 IMPLANT
TOWEL OR 17X26 10 PK STRL BLUE (TOWEL DISPOSABLE) ×7 IMPLANT
TRAP SPECIMEN MUCOUS 40CC (MISCELLANEOUS) ×4 IMPLANT
TUBE ANAEROBIC SPECIMEN COL (MISCELLANEOUS) IMPLANT
TUBE CONNECTING 12'X1/4 (SUCTIONS) ×2
TUBE CONNECTING 12X1/4 (SUCTIONS) ×6 IMPLANT
WATER STERILE IRR 1000ML POUR (IV SOLUTION) ×4 IMPLANT

## 2013-01-23 NOTE — Progress Notes (Signed)
TCTS  The patient was examined and preop studies reviewed. There has been no change from the prior exam and the patient is ready for surgery. Plan EBUS,   mediastinoscopy today on [address] [place]

## 2013-01-23 NOTE — Anesthesia Preprocedure Evaluation (Addendum)
Anesthesia Evaluation  Patient identified by MRN, date of birth, ID band Patient awake    Reviewed: Allergy & Precautions, H&P , NPO status , Patient's Chart, lab work & pertinent test results  History of Anesthesia Complications Negative for: history of anesthetic complications  Airway Mallampati: II TM Distance: >3 FB Neck ROM: full    Dental  (+) Dental Advisory Given and Teeth Intact   Pulmonary neg pulmonary ROS,          Cardiovascular hypertension, Pt. on medications + dysrhythmias Atrial Fibrillation     Neuro/Psych  Headaches, negative psych ROS   GI/Hepatic Neg liver ROS, GERD-  Medicated and Controlled,  Endo/Other  negative endocrine ROSobese  Renal/GU negative Renal ROS     Musculoskeletal negative musculoskeletal ROS (+)   Abdominal   Peds  Hematology negative hematology ROS (+)   Anesthesia Other Findings Temporary upper left crown  Reproductive/Obstetrics                         Anesthesia Physical Anesthesia Plan  ASA: II  Anesthesia Plan: General   Post-op Pain Management:    Induction: Intravenous  Airway Management Planned: Oral ETT  Additional Equipment:   Intra-op Plan:   Post-operative Plan: Extubation in OR  Informed Consent: I have reviewed the patients History and Physical, chart, labs and discussed the procedure including the risks, benefits and alternatives for the proposed anesthesia with the patient or authorized representative who has indicated his/her understanding and acceptance.     Plan Discussed with: CRNA, Anesthesiologist and Surgeon  Anesthesia Plan Comments:         Anesthesia Quick Evaluation

## 2013-01-23 NOTE — Brief Op Note (Signed)
01/23/2013  10:45 AM  PATIENT:  Jorge Peterson  52 y.o. male  PRE-OPERATIVE DIAGNOSIS:  Left upper lobe lung nodule  POST-OPERATIVE DIAGNOSIS:  Left upper lobe lung nodule  PROCEDURE:  Procedure(s): VIDEO BRONCHOSCOPY WITH ENDOBRONCHIAL ULTRASOUND (N/A) MEDIASTINOSCOPY (N/A)  SURGEON:  Surgeon(s) and Role:    * Kerin Perna, MD - Primary  PHYSICIAN ASSISTANT: none  ASSISTANTS:none ANESTHESIA:   general  EBL:  Total I/O In: 1000 [I.V.:1000] Out: 15 [Blood:15]  BLOOD ADMINISTERED:none  DRAINS: none   LOCAL MEDICATIONS USED:  NONE  SPECIMEN:  Biopsy / Limited Resection   4L node,2L node, 4R node   Brushings and washings LUL  DISPOSITION OF SPECIMEN:  PATHOLOGY  COUNTS:  YES  TOURNIQUET:  * No tourniquets in log *  DICTATION: .Dragon Dictation  PLAN OF CARE: Admit for overnight observation  PATIENT DISPOSITION:  PACU - hemodynamically stable.   Delay start of Pharmacological VTE agent (>24hrs) due to surgical blood loss or risk of bleeding: yes

## 2013-01-23 NOTE — Anesthesia Postprocedure Evaluation (Signed)
  Anesthesia Post-op Note  Patient: Jorge Peterson  Procedure(s) Performed: Procedure(s): VIDEO BRONCHOSCOPY WITH ENDOBRONCHIAL ULTRASOUND (N/A) MEDIASTINOSCOPY (N/A)  Patient Location: PACU  Anesthesia Type:General  Level of Consciousness: awake  Airway and Oxygen Therapy: Patient Spontanous Breathing  Post-op Pain: mild  Post-op Assessment: Post-op Vital signs reviewed, Patient's Cardiovascular Status Stable, Respiratory Function Stable, Patent Airway, No signs of Nausea or vomiting and Pain level controlled  Post-op Vital Signs: stable  Complications: No apparent anesthesia complications

## 2013-01-23 NOTE — Preoperative (Signed)
Beta Blockers   Reason not to administer Beta Blockers:Not Applicable 

## 2013-01-23 NOTE — Transfer of Care (Signed)
Immediate Anesthesia Transfer of Care Note  Patient: Jorge Peterson  Procedure(s) Performed: Procedure(s): VIDEO BRONCHOSCOPY WITH ENDOBRONCHIAL ULTRASOUND (N/A) MEDIASTINOSCOPY (N/A)  Patient Location: PACU  Anesthesia Type:General  Level of Consciousness: awake, alert  and oriented  Airway & Oxygen Therapy: Patient Spontanous Breathing and Patient connected to nasal cannula oxygen  Post-op Assessment: Report given to PACU RN and Post -op Vital signs reviewed and stable  Post vital signs: Reviewed and stable  Complications: No apparent anesthesia complications

## 2013-01-24 ENCOUNTER — Encounter (HOSPITAL_COMMUNITY): Payer: Self-pay | Admitting: Cardiothoracic Surgery

## 2013-01-24 ENCOUNTER — Inpatient Hospital Stay (HOSPITAL_COMMUNITY): Payer: PRIVATE HEALTH INSURANCE

## 2013-01-24 DIAGNOSIS — Z9889 Other specified postprocedural states: Secondary | ICD-10-CM

## 2013-01-24 LAB — BASIC METABOLIC PANEL
BUN: 12 mg/dL (ref 6–23)
CO2: 31 mEq/L (ref 19–32)
Calcium: 9 mg/dL (ref 8.4–10.5)
Chloride: 102 mEq/L (ref 96–112)
Creatinine, Ser: 1.12 mg/dL (ref 0.50–1.35)
GFR calc Af Amer: 85 mL/min — ABNORMAL LOW (ref >90)
GFR calc non Af Amer: 73 mL/min — ABNORMAL LOW (ref >90)
Glucose, Bld: 117 mg/dL — ABNORMAL HIGH (ref 70–99)
Potassium: 4.6 mEq/L (ref 3.5–5.1)
Sodium: 138 mEq/L (ref 135–145)

## 2013-01-24 LAB — CBC
HCT: 37 % — ABNORMAL LOW (ref 39.0–52.0)
Hemoglobin: 12.8 g/dL — ABNORMAL LOW (ref 13.0–17.0)
MCH: 28.7 pg (ref 26.0–34.0)
MCHC: 34.6 g/dL (ref 30.0–36.0)
MCV: 83 fL (ref 78.0–100.0)
Platelets: 155 10*3/uL (ref 150–400)
RBC: 4.46 MIL/uL (ref 4.22–5.81)
RDW: 13 % (ref 11.5–15.5)
WBC: 8.7 10*3/uL (ref 4.0–10.5)

## 2013-01-24 MED ORDER — TRAMADOL HCL 50 MG PO TABS: 50.0000 mg | ORAL_TABLET | Freq: Four times a day (QID) | ORAL | Status: DC | PRN

## 2013-01-24 NOTE — Discharge Summary (Signed)
Physician Discharge Summary  Patient ID: Jorge Peterson MRN: [id number] DOB/AGE: 1961/01/18 52 y.o.  Admit date: 01/23/2013 Discharge date: 01/24/2013  Admission Diagnoses:  Patient Active Problem List   Diagnosis Date Noted  . Borderline diabetes 11/27/2012  . Chest pain, mid sternal 11/26/2012  . Abnormal CT of the chest   11/26/2012  . History of adenomatous polyp of colon 11/05/2011  . Sigmoid diverticulitis 10/23/2011  . Leukocytosis 10/23/2011  . HLD (hyperlipidemia) 10/23/2011  . HTN (hypertension) 10/23/2011  . Bradycardia 10/07/2010  . OTHER CHEST PAIN 10/07/2010  . Atrial fibrillation 12/03/2009   Discharge Diagnoses:   Patient Active Problem List   Diagnosis Date Noted  . H/O lymph node biopsy 01/24/2013  . Borderline diabetes 11/27/2012  . Chest pain, mid sternal 11/26/2012  . Abnormal CT of the chest   11/26/2012  . History of adenomatous polyp of colon 11/05/2011  . Sigmoid diverticulitis 10/23/2011  . Leukocytosis 10/23/2011  . HLD (hyperlipidemia) 10/23/2011  . HTN (hypertension) 10/23/2011  . Bradycardia 10/07/2010  . OTHER CHEST PAIN 10/07/2010  . Atrial fibrillation 12/03/2009   Discharged Condition: good  History of Present Illness:   Mr. Scheaffer is a 52 yo white male non- smoker with known history of borderline diabetes and Atrial Fibrillation S/P  Ablation by Dr. Johney Frame.  He was recently diagnosed with a 1.8 cm left upper lobe nodule mass.  This has been followed with CT scans and had grown slightly since previous scan.  The patient underwent PET CT scan which showed mild hypermetabolic activity.  However there was left high and left paratracheal lymph nodes that exhibited extremely high metabolic activity.  The patient was referred for possible surgical intervention.  The patient admits over the past several weeks he has been suffering from a non- productive cough with associated hoarseness.  He denied any environmental exposure.  He underwent  evaluation by ENT which revealed weakened vocal cord musculature.  He was evaluated by Dr. Donata Clay on 5/22 at which time it was felt the patient would benefit from bronchoscopy and EBUS and possible Mediastinoscopy if no diagnostic tissue is obtained.  The risks and benefits of the procedure were explained to the patient and he was agreeable to proceed.      Hospital Course:   The patient presented to [facility] on 01/23/2013.  He was taken to the operating room and underwent Video Bronchoscopy and EBUS with biopsy of mediastinal lymph nodes.  Frozen pathology was submitted from this procedure and was non-diagnostic.  Therefore the patient underwent Mediastinoscopy with lymph node biopsy.  The patient tolerated the procedure well, was extubated and taken to the PACU in stable condition.  The patient did well overnight.  He does have some pain in his right shoulder which he attributes to a known rotator cuff injury.  Follow up chest xray does not show evidence of pneumothorax.  Final pathology has not been reported and we will contact the patient when those results are available.  He is medically stable at this time and will be discharged home today.   Treatments: surgery:   1. Video bronchoscopy.  2. Bronchoscopy with endobronchial ultrasound-directed biopsy  (endobronchial ultrasound of mediastinal lymph nodes).  3. Mediastinoscopy with biopsy of mediastinal lymph nodes.  Disposition: 01-Home or Self Care   Future Appointments Provider Department Dept Phone   01/31/2013 2:15 PM Kerin Perna, MD [facility] [phone]       Medication List  TAKE these medications       acetaminophen 500 MG tablet  Commonly known as:  TYLENOL  Take 1,000 mg by mouth 3 (three) times daily as needed for pain.     aspirin EC 81 MG tablet  Take 81 mg by mouth daily.     Biotin 1000 MCG tablet  Take 1,000 mcg by mouth daily.     COQ10 150 MG Caps   Take 150 mg by mouth daily.     fish oil-omega-3 fatty acids 1000 MG capsule  Take 1 g by mouth daily.     fluticasone 50 MCG/ACT nasal spray  Commonly known as:  FLONASE  Place 2 sprays into the nose 2 (two) times daily.     folic acid 400 MCG tablet  Commonly known as:  FOLVITE  Take 400 mcg by mouth daily.     losartan 50 MG tablet  Commonly known as:  COZAAR  Take 50 mg by mouth daily.     multivitamin tablet  Take 1 tablet by mouth daily.     omeprazole 40 MG capsule  Commonly known as:  PRILOSEC  Take 40 mg by mouth daily.     PROBIOTIC FORMULA PO  Take 1 capsule by mouth daily.     simvastatin 40 MG tablet  Commonly known as:  ZOCOR  Take 40 mg by mouth at bedtime.     traMADol 50 MG tablet  Commonly known as:  ULTRAM  Take 1-2 tablets (50-100 mg total) by mouth every 6 (six) hours as needed.     traZODone 50 MG tablet  Commonly known as:  DESYREL  Take 50 mg by mouth at bedtime as needed for sleep.           Follow-up Information   Follow up with VAN Dinah Beers, MD In 2 weeks.   Contact information:   [address] [place] [address] [phone]       Signed: Lowella Dandy 01/24/2013, 8:08 AM

## 2013-01-24 NOTE — Progress Notes (Signed)
Discharge instructions given to pt along with new prescription. Pt verbalized his understanding. Pt is stable for discharge with wife. Incision site is clean dry and intact- no signs of infection. Will provide wheelchair to escort pt to car.

## 2013-01-24 NOTE — Op Note (Signed)
NAME:  Jorge Peterson, Jorge Peterson              ACCOUNT NO.:  [id number]  MEDICAL RECORD NO.:  [id number]  LOCATION:  2022                         FACILITY:  [facility]  PHYSICIAN:  Kerin Perna, M.D.  DATE OF BIRTH:  21-Nov-1960  DATE OF PROCEDURE: DATE OF DISCHARGE:                              OPERATIVE REPORT   OPERATIONS: 1. Video bronchoscopy. 2. Bronchoscopy with endobronchial ultrasound-directed biopsy     (endobronchial ultrasound of mediastinal lymph nodes). 3. Mediastinoscopy with biopsy of mediastinal lymph nodes.  PREOPERATIVE DIAGNOSES:  Left upper lobe nodule with hypermetabolic mediastinal lymph nodes, consistent with clinical stage III lung cancer.  POSTOPERATIVE DIAGNOSES:  Left upper lobe nodule with hypermetabolic mediastinal lymph nodes, consistent with clinical stage III lung cancer.  SURGEON:  Kerin Perna, M.D.  ANESTHESIA:  General by Dr. Achille Rich.  INDICATIONS:  The patient is a 52 year old Caucasian male, nonsmoker who presented with atypical chest pain associated with atrial fibrillation and chest x-ray showed a density in the left lung, which was confirmed by CT scan in early 2014.  Interval 73-month followup CT scan showed this nodular infiltrate tube has slightly enlarged and a PET scan was performed.  The PET scan demonstrated intense hypermetabolic activity of mediastinal lymph nodes along the lower left trachea and subcarinal region and the prevascular region at the left hilum and mild hypermetabolic activity of the actual nodular density in the left upper lobe.  The patient was referred to the thoracic oncology clinic and biopsy and diagnosis and clinical staging were recommended with EBUS biopsy of the nodes and mediastinoscopy if required.  I discussed the procedure EBUS and mediastinoscopy with the patient and his family, including the indications, alternatives, benefits and risks.  The plan was to perform immediate cystoscopy while the patient  was under anesthesia, if the quit prep cytology on the EBUS aspirates were nondiagnostic.  The patient understood the risks of bleeding pneumothorax.  OPERATIVE PROCEDURE:  The patient was brought to the operating room, placed supine on the operating table.  General anesthesia was induced. A proper time-out was performed.  A video bronchoscopy was performed. There were no endobronchial lesions at the distal trachea or carina. The right mainstem bronchus and the right upper, right middle, and right lower lobe endobronchial orifices were examined and were clear of any endobronchial lesions.  The left mainstem bronchus had no endobronchial lesions.  The left upper lobe bronchus had some extrinsic compression and the left lower bronchus was normal with endobronchial segments being normal.  Washings and brushings of the left upper lobe were taken and submitted for cytology.  The EBUS scope was then inserted and the enlarged lymph nodes at the 4L area and the subcarinal region were sampled with transbronchial needle biopsies.  Several specimens were sent, some for quick prep and some for permanent cyto pathology.  There was minimal bleeding.  The bronchoscope was withdrawn.  We waited for the quick prep results, which were nondiagnostic so we proceeded with mediastinoscopy.  The patient was prepped and draped as a sterile field from the neck to the lower sternum.  A proper time-out was performed.  A small incision was made above the sternal notch.  Dissection  was carried down to the pretracheal plane.  Some adipose-lymphatic tissue in the 2L region was biopsied and submitted for pathology.  The scope was advanced further. There was no visible lymph node tissue going into the innominate artery to the aorta.  Next, the plane of dissection was developed down the right side of the trachea towards the area of the azygos vein and some 4R lymph node tissue was biopsied and submitted for  pathology.  Before any biopsies were taken, the area was aspirated with a needle to make sure there was not a vascular structure.  There was minimal bleeding controlled with electrocautery and a small piece of Surgicel.  After hemostasis was obtained, the incision was irrigated and closed in layers using Vicryl for the subcutaneous layer and a running subcuticular Vicryl for the skin.  The patient was reversed from anesthesia, and returned to the recovery room in stable condition.     Kerin Perna, M.D.     PV/MEDQ  D:  01/23/2013  T:  01/24/2013  Job:  [id number]  cc:   Dr. Burnett Sheng.

## 2013-01-24 NOTE — Progress Notes (Signed)
1 Day Post-Op Procedure(s) (LRB): VIDEO BRONCHOSCOPY WITH ENDOBRONCHIAL ULTRASOUND (N/A) MEDIASTINOSCOPY (N/A) Subjective:  Jorge Peterson complains of shoulder pain this morning.  He states he has a torn rotator cuff, that he thinks was aggravated by positioning in the operating room.  Objective: Vital signs in last 24 hours: Temp:  [97.4 F (36.3 C)-98.4 F (36.9 C)] 98.1 F (36.7 C) (05/28 0549) Pulse Rate:  [60-77] 60 (05/28 0549) Cardiac Rhythm:  [-] Normal sinus rhythm;Heart block (05/27 2001) Resp:  [11-21] 20 (05/28 0549) BP: (129-145)/(68-87) 140/74 mmHg (05/28 0549) SpO2:  [93 %-98 %] 95 % (05/28 0549) Arterial Line BP: (139-163)/(63-94) 163/72 mmHg (05/27 1230)  Intake/Output from previous day: 05/27 0701 - 05/28 0700 In: 3162.5 [P.O.:720; I.V.:2392.5; IV Piggyback:50] Out: 145 [Urine:100; Blood:45]  General appearance: alert, cooperative and no distress Neurologic: intact Heart: regular rate and rhythm Lungs: clear to auscultation bilaterally Abdomen: soft, non-tender; bowel sounds normal; no masses,  no organomegaly Wound: clean and dry  Lab Results:  Recent Labs  01/24/13 0439  WBC 8.7  HGB 12.8*  HCT 37.0*  PLT 155   BMET:  Recent Labs  01/24/13 0439  NA 138  K 4.6  CL 102  CO2 31  GLUCOSE 117*  BUN 12  CREATININE 1.12  CALCIUM 9.0    PT/INR: No results found for this basename: LABPROT, INR,  in the last 72 hours ABG    Component Value Date/Time   PHART 7.371 01/23/2013 1100   HCO3 24.8* 01/23/2013 1100   TCO2 26.2 01/23/2013 1100   O2SAT 96.8 01/23/2013 1100   CBG (last 3)   Recent Labs  01/23/13 1220  GLUCAP 143*    Assessment/Plan: S/P Procedure(s) (LRB): VIDEO BRONCHOSCOPY WITH ENDOBRONCHIAL ULTRASOUND (N/A) MEDIASTINOSCOPY (N/A)  1. S/P Mediastinoscopy-f/u CXR no evidence of pneumothorax 2. Pain control- on Ultram 3. Surgical pathology pending 4. Dispo- patient stable, will d/c today   LOS: 1 day    Lowella Dandy 01/24/2013

## 2013-01-25 ENCOUNTER — Other Ambulatory Visit: Payer: Self-pay

## 2013-01-25 DIAGNOSIS — D381 Neoplasm of uncertain behavior of trachea, bronchus and lung: Secondary | ICD-10-CM

## 2013-01-25 NOTE — Discharge Summary (Signed)
patient examined and medical record reviewed,agree with above note. VAN TRIGT III,Pete Schnitzer 01/25/2013

## 2013-01-26 ENCOUNTER — Other Ambulatory Visit: Payer: Self-pay | Admitting: Radiology

## 2013-01-29 ENCOUNTER — Encounter (HOSPITAL_COMMUNITY): Payer: Self-pay | Admitting: Pharmacy Technician

## 2013-01-31 ENCOUNTER — Other Ambulatory Visit: Payer: Self-pay | Admitting: Radiology

## 2013-01-31 ENCOUNTER — Ambulatory Visit: Payer: PRIVATE HEALTH INSURANCE | Admitting: Cardiothoracic Surgery

## 2013-01-31 ENCOUNTER — Ambulatory Visit (HOSPITAL_COMMUNITY)
Admission: RE | Admit: 2013-01-31 | Discharge: 2013-01-31 | Disposition: A | Discharge status: Home / Self Care | Payer: PRIVATE HEALTH INSURANCE | Source: Ambulatory Visit | Attending: Cardiothoracic Surgery | Admitting: Cardiothoracic Surgery

## 2013-01-31 DIAGNOSIS — D381 Neoplasm of uncertain behavior of trachea, bronchus and lung: Secondary | ICD-10-CM

## 2013-02-01 ENCOUNTER — Ambulatory Visit (HOSPITAL_COMMUNITY)
Admission: RE | Admit: 2013-02-01 | Discharge: 2013-02-01 | Disposition: A | Discharge status: Home / Self Care | Payer: PRIVATE HEALTH INSURANCE | Source: Ambulatory Visit | Attending: Cardiothoracic Surgery | Admitting: Cardiothoracic Surgery

## 2013-02-01 ENCOUNTER — Encounter (HOSPITAL_COMMUNITY): Payer: Self-pay

## 2013-02-01 ENCOUNTER — Other Ambulatory Visit (HOSPITAL_COMMUNITY): Payer: Self-pay | Admitting: Interventional Radiology

## 2013-02-01 DIAGNOSIS — J984 Other disorders of lung: Secondary | ICD-10-CM | POA: Insufficient documentation

## 2013-02-01 DIAGNOSIS — Z9889 Other specified postprocedural states: Secondary | ICD-10-CM

## 2013-02-01 LAB — PROTIME-INR
INR: 0.92 (ref 0.00–1.49)
Prothrombin Time: 12.3 seconds (ref 11.6–15.2)

## 2013-02-01 LAB — CBC
Hemoglobin: 14.9 g/dL (ref 13.0–17.0)
MCH: 27.6 pg (ref 26.0–34.0)
MCHC: 34.1 g/dL (ref 30.0–36.0)
MCV: 81.1 fL (ref 78.0–100.0)
RBC: 5.39 MIL/uL (ref 4.22–5.81)

## 2013-02-01 MED ORDER — HYDROCODONE-ACETAMINOPHEN 5-325 MG PO TABS
1.0000 | ORAL_TABLET | ORAL | Status: DC | PRN
  Administered 2013-02-01: 1 via ORAL

## 2013-02-01 MED ORDER — HYDROCODONE-ACETAMINOPHEN 5-325 MG PO TABS
ORAL_TABLET | ORAL | Status: AC
  Filled 2013-02-01: qty 1

## 2013-02-01 MED ORDER — FENTANYL CITRATE 0.05 MG/ML IJ SOLN
INTRAMUSCULAR | Status: DC
Start: 2013-02-01 — End: 2013-02-02
  Filled 2013-02-01: qty 4

## 2013-02-01 MED ORDER — SODIUM CHLORIDE 0.9 % IV SOLN: INTRAVENOUS | Status: DC

## 2013-02-01 MED ORDER — SODIUM CHLORIDE 0.9 % IV SOLN
INTRAVENOUS | Status: AC | PRN
  Administered 2013-02-01: 20 mL/h via INTRAVENOUS

## 2013-02-01 MED ORDER — MIDAZOLAM HCL 2 MG/2ML IJ SOLN
INTRAMUSCULAR | Status: AC | PRN
  Administered 2013-02-01: 2 mg via INTRAVENOUS

## 2013-02-01 MED ORDER — FENTANYL CITRATE 0.05 MG/ML IJ SOLN
INTRAMUSCULAR | Status: AC | PRN
  Administered 2013-02-01: 50 ug via INTRAVENOUS

## 2013-02-01 MED ORDER — MIDAZOLAM HCL 2 MG/2ML IJ SOLN
INTRAMUSCULAR | Status: AC
  Filled 2013-02-01: qty 6

## 2013-02-01 NOTE — H&P (Signed)
Jorge Peterson is an 52 y.o. male.   Chief Complaint: Pt with SOB and chest pain 10/2012 Work up revealed Left lung mass- rec: re scan 4 weeks CT 12/2012 revealed persistent mass- larger 01/15/13: +PET with hilar nodes Biopsy of nodes neg Bronchoscopy neg Scheduled now for needle biopsy of left lung mass HPI: Afib; HLD; HTN; GERD  Past Medical History  Diagnosis Date  . Atrial fibrillation     s/p afib ablation 7/12  . Hyperlipidemia   . Bradycardia     asymptomatic  . Diverticulitis   . Hypertension   . Shortness of breath   . GERD (gastroesophageal reflux disease)   . Headache(784.0)   . Family history of anesthesia complication     father gets pneumonia after "    Past Surgical History  Procedure Laterality Date  . Shoulder surgery      x 2  . Back surgery    . Atrial ablation surgery  03/24/11    PVI by JA  . Fistula surgery    . Tonsillectomy    . Bronchoscopy  01/23/2013     biopsy  . Video bronchoscopy with endobronchial ultrasound N/A 01/23/2013    Procedure: VIDEO BRONCHOSCOPY WITH ENDOBRONCHIAL ULTRASOUND;  Surgeon: Kerin Perna, MD;  Location: [facility] OR;  Service: Thoracic;  Laterality: N/A;  . Mediastinoscopy N/A 01/23/2013    Procedure: MEDIASTINOSCOPY;  Surgeon: Kerin Perna, MD;  Location: [facility] OR;  Service: Thoracic;  Laterality: N/A;    Family History  Problem Relation Age of Onset  . Diabetes Father   . Heart disease Father     CABG at 57   Social History:  reports that he has never smoked. He has never used smokeless tobacco. He reports that he does not drink alcohol or use illicit drugs.  Allergies:  Allergies  Allergen Reactions  . Oxycodone-Acetaminophen Itching and Rash     (Not in a hospital admission)  No results found for this or any previous visit (from the past 48 hour(s)). No results found.  Review of Systems  Constitutional: Negative for fever and weight loss.  Respiratory: Positive for shortness of breath.   Cardiovascular:  Positive for chest pain.  Gastrointestinal: Negative for nausea and vomiting.  Neurological: Negative for weakness and headaches.    Blood pressure 136/88, pulse 55, temperature 97.5 F (36.4 C), temperature source Oral, resp. rate 18, height 5\' 7"  (1.702 m), weight 220 lb (99.791 kg), SpO2 100.00%. Physical Exam  Constitutional: He is oriented to person, place, and time. He appears well-developed and well-nourished.  Cardiovascular: Normal rate, regular rhythm and normal heart sounds.   No murmur heard. Respiratory: Effort normal and breath sounds normal. He has no wheezes.  GI: Soft. Bowel sounds are normal. There is no tenderness.  Musculoskeletal: Normal range of motion.  Neurological: He is alert and oriented to person, place, and time.  Psychiatric: He has a normal mood and affect. His behavior is normal. Judgment and thought content normal.     Assessment/Plan Left lung mass +PET; larger on CT scan Scheduled for needle bx today Pt aware of procedure benefits and risks and agreeable to proceed Consent signed and in chart  [name] A 02/01/2013, 8:14 AM

## 2013-02-01 NOTE — Procedures (Signed)
CT lung core bx 18g x2 to surgpath No complication No blood loss. See complete dictation in [facility].

## 2013-02-02 ENCOUNTER — Telehealth (HOSPITAL_COMMUNITY): Payer: Self-pay | Admitting: *Deleted

## 2013-02-07 ENCOUNTER — Ambulatory Visit (INDEPENDENT_AMBULATORY_CARE_PROVIDER_SITE_OTHER): Payer: PRIVATE HEALTH INSURANCE | Admitting: Cardiothoracic Surgery

## 2013-02-07 ENCOUNTER — Ambulatory Visit: Payer: PRIVATE HEALTH INSURANCE | Admitting: Cardiothoracic Surgery

## 2013-02-07 ENCOUNTER — Encounter: Payer: Self-pay | Admitting: Cardiothoracic Surgery

## 2013-02-07 VITALS — BP 124/80 | HR 51 | Resp 18 | Ht 67.0 in | Wt 200.0 lb

## 2013-02-07 DIAGNOSIS — Z9889 Other specified postprocedural states: Secondary | ICD-10-CM

## 2013-02-07 DIAGNOSIS — R911 Solitary pulmonary nodule: Secondary | ICD-10-CM

## 2013-02-07 DIAGNOSIS — R590 Localized enlarged lymph nodes: Secondary | ICD-10-CM

## 2013-02-07 DIAGNOSIS — R599 Enlarged lymph nodes, unspecified: Secondary | ICD-10-CM

## 2013-02-07 DIAGNOSIS — J984 Other disorders of lung: Secondary | ICD-10-CM

## 2013-02-07 NOTE — Progress Notes (Signed)
PCP is Jerl Mina, MD Referring Provider is Jerl Mina, MD  Chief Complaint  Patient presents with  . Routine Post Op    2 week f/u S/P EBUS,Bronch    HPI 52 year old nonsmoker returns for followup after being evaluated and biopsied for a 1.9 cm left upper lobe nodule with hypermetabolic activity on PET. Previous mediastinal nodes were biopsied with bronchoscopy and EBUS showing no malignancy, granulomatous changes. The needle biopsy of the primary left upper lobe nodule also returned granulomatous inflammation with giant cell formation--stains negative for AFB and fungus.  The patient recently went on admission to 1 mile at a party and his mission group also developed a pulmonary illness. The patient denied any symptoms other than mild upper   respiratory infection. The patient has not had a TB or fungal workup butis to be evaluated by the infectious disease clinic this week.   Past Medical History  Diagnosis Date  . Atrial fibrillation     s/p afib ablation 7/12  . Hyperlipidemia   . Bradycardia     asymptomatic  . Diverticulitis   . Hypertension   . Shortness of breath   . GERD (gastroesophageal reflux disease)   . Headache(784.0)   . Family history of anesthesia complication     father gets pneumonia after "    Past Surgical History  Procedure Laterality Date  . Shoulder surgery      x 2  . Back surgery    . Atrial ablation surgery  03/24/11    PVI by JA  . Fistula surgery    . Tonsillectomy    . Bronchoscopy  01/23/2013     biopsy  . Video bronchoscopy with endobronchial ultrasound N/A 01/23/2013    Procedure: VIDEO BRONCHOSCOPY WITH ENDOBRONCHIAL ULTRASOUND;  Surgeon: Kerin Perna, MD;  Location: [facility] OR;  Service: Thoracic;  Laterality: N/A;  . Mediastinoscopy N/A 01/23/2013    Procedure: MEDIASTINOSCOPY;  Surgeon: Kerin Perna, MD;  Location: [facility] OR;  Service: Thoracic;  Laterality: N/A;    Family History  Problem Relation Age of Onset  . Diabetes  Father   . Heart disease Father     CABG at 46    Social History History  Substance Use Topics  . Smoking status: Never Smoker   . Smokeless tobacco: Never Used  . Alcohol Use: No    Current Outpatient Prescriptions  Medication Sig Dispense Refill  . acetaminophen (TYLENOL) 500 MG tablet Take 1,000 mg by mouth 3 (three) times daily as needed for pain.      [name] aspirin EC 81 MG tablet Take 81 mg by mouth daily.      . Biotin 1000 MCG tablet Take 1,000 mcg by mouth daily.      . Coenzyme Q10 (COQ10) 150 MG CAPS Take 150 mg by mouth daily.       . fish oil-omega-3 fatty acids 1000 MG capsule Take 1 g by mouth daily.       . fluticasone (FLONASE) 50 MCG/ACT nasal spray Place 2 sprays into the nose 2 (two) times daily.      . folic acid (FOLVITE) 400 MCG tablet Take 400 mcg by mouth daily.        [name] losartan (COZAAR) 50 MG tablet Take 50 mg by mouth daily.      . Multiple Vitamin (MULTIVITAMIN) tablet Take 1 tablet by mouth daily.       [name] omeprazole (PRILOSEC) 40 MG capsule Take 40 mg by mouth daily.       [name]  Probiotic Product (PROBIOTIC FORMULA PO) Take 1 capsule by mouth daily.      . simvastatin (ZOCOR) 40 MG tablet Take 40 mg by mouth at bedtime.        No current facility-administered medications for this visit.    Allergies  Allergen Reactions  . Oxycodone-Acetaminophen Itching and Rash    Review of Systems feels fine no symptoms  BP 124/80  Pulse 51  Resp 18  Ht 5\' 7"  (1.702 m)  Wt 200 lb (90.719 kg)  BMI 31.32 kg/m2  SpO2 97% Physical Exam Alert and comfortable Lungs clear No palpable adenopathy the neck Cardiac rhythm regular  Diagnostic Tests: Reviewed pathology of lung biopsy with patient and wife showing inflammatory changes, granulomatous disease.  Impression: Probable benign process in his left upper lobe causing the left mediastinal adenopathy. Treatment will be deferred to infectious disease service.  Plan: He'll return to see me with this chest CT scan  in 6 months to make sure this process starts to resolve.

## 2013-02-08 ENCOUNTER — Encounter: Payer: Self-pay | Admitting: Internal Medicine

## 2013-02-08 ENCOUNTER — Ambulatory Visit (INDEPENDENT_AMBULATORY_CARE_PROVIDER_SITE_OTHER): Payer: PRIVATE HEALTH INSURANCE | Admitting: Internal Medicine

## 2013-02-08 VITALS — BP 148/96 | HR 76 | Temp 97.9°F | Ht 67.0 in | Wt 222.0 lb

## 2013-02-08 DIAGNOSIS — R911 Solitary pulmonary nodule: Secondary | ICD-10-CM

## 2013-02-08 NOTE — Progress Notes (Signed)
  Subjective:    Patient ID: Jorge Peterson, male    DOB: 07/03/1961, 52 y.o.   MRN: [id number]  HPI He comes in his evaluation for a new patient with left upper lobe nodule. He developed a bout of nearly 2 cm nodule that was noted on the chest x-ray the CT scan when he was being evaluated for atrial fibrillation. He underwent PET scan which did show hypermetabolic activity.  He underwent bronchoscopy to to lymph nodes and needle biopsy os which showed granulomatous areas. Pathology had negative stains for AFB and fungal. She has had no fever chills, no night sweats, no weight loss. He did recently go on a ten-day trip to a mall and incidentally was with a traveler who also developed pulmonary symptoms and nodules were noted after a tooth infection. He does have a no shortness of breath. He does note some pleuritic chest pain but otherwise feels well. He is able to exercise. He is followed by Dr. Morton Peters who is going to follow it clinically now. Plan is for repeat CAT scan in 6 months. Here for evaluation of possible etiology.     Review of Systems  Constitutional: Negative for fever, chills, appetite change, fatigue and unexpected weight change.  Respiratory: Negative for cough and shortness of breath.   Cardiovascular: Negative for leg swelling.       Some pleuritic chest pain   Gastrointestinal: Negative for nausea, abdominal pain and diarrhea.  Musculoskeletal: Negative for myalgias, joint swelling and arthralgias.  Skin: Negative for rash.  Neurological: Negative for dizziness and headaches.       Objective:   Physical Exam  Constitutional: He appears well-developed and well-nourished. No distress.  HENT:  Mouth/Throat: Oropharynx is clear and moist. No oropharyngeal exudate.  Cardiovascular: Normal rate, regular rhythm and normal heart sounds.   No murmur heard. Pulmonary/Chest: Effort normal and breath sounds normal. No respiratory distress. He has no wheezes.  Lymphadenopathy:     He has no cervical adenopathy.          Assessment & Plan:

## 2013-02-09 DIAGNOSIS — R911 Solitary pulmonary nodule: Secondary | ICD-10-CM | POA: Insufficient documentation

## 2013-02-09 LAB — CRYPTOCOCCAL ANTIGEN: Crypto Ag: NEGATIVE

## 2013-02-09 NOTE — Assessment & Plan Note (Addendum)
No known diagnosis on previous biopsy. Did show granulomatous giant cell blood stains negative. At this time, he is having no significant symptoms, no weight loss or any other concerns, therefore watchful waiting I think is okay. I will however check some fungal studies and a PPD to see if that leads [place] in any other direction at this time but if all unrevealing, this can be followed clinically and radiographically. He is going to get a repeat CT scan in 6 months by Dr. Morton Peters.  He will call if there is any significant inflammation of the PPD.  He also knows to call if he develops any pulmonary symptoms that are unrelieved.

## 2013-03-03 ENCOUNTER — Other Ambulatory Visit: Payer: Self-pay | Admitting: Internal Medicine

## 2013-03-27 ENCOUNTER — Encounter: Payer: Self-pay | Admitting: Internal Medicine

## 2013-03-31 ENCOUNTER — Telehealth: Payer: Self-pay | Admitting: Internal Medicine

## 2013-03-31 NOTE — Telephone Encounter (Signed)
Patient called complaining of left sided abdominal pain that he is certain is diverticulitis and for which he requests antibiotics. States that Augmentin works best for him. Review of electronic chart show that he was last seen in the office, by Dr. Rhea Belton [phone]. Also, since that visit, THIS IS THE 5 TH TIME IN THE PAST YEAR THAT HE HAS CONTACTED GI PROVIDERS OFF HOURS AND RECIEVED TREATMENT FOR PRESUME DIVERTICULITIS. Also, review of chart shows colonoscopy elsewhere ([facility]) 07-2008 with VILLOUS ADENOMA and diverticulosis.   I called in ([facility]) Augmentin 875 mg bid x 7 days but advised that he should be seen in the office this week or next due to the high frequency of symptoms (presumably diverticulitis). May even need follow up colonoscopy given history of advanced adenoma.  Will forward to Dr Rhea Belton and his nurse.

## 2013-04-02 NOTE — Telephone Encounter (Signed)
Scheduled pt to see Dr Rhea Belton on 04/12/13 at 9am; pt stated understanding. This is an earlier appt, but this was the closest to pt and Dr Lauro Franklin schedules.

## 2013-04-02 NOTE — Telephone Encounter (Signed)
Jorge Peterson Please schedule Jorge Peterson in clinic to see me in about 2-3 weeks to discuss recurrent diverticulitis

## 2013-04-06 ENCOUNTER — Encounter: Payer: Self-pay | Admitting: Internal Medicine

## 2013-04-12 ENCOUNTER — Ambulatory Visit (INDEPENDENT_AMBULATORY_CARE_PROVIDER_SITE_OTHER): Payer: PRIVATE HEALTH INSURANCE | Admitting: Internal Medicine

## 2013-04-12 ENCOUNTER — Encounter: Payer: Self-pay | Admitting: Internal Medicine

## 2013-04-12 VITALS — BP 136/88 | HR 60 | Ht 66.0 in | Wt 223.5 lb

## 2013-04-12 DIAGNOSIS — Z86010 Personal history of colonic polyps: Secondary | ICD-10-CM

## 2013-04-12 DIAGNOSIS — K5732 Diverticulitis of large intestine without perforation or abscess without bleeding: Secondary | ICD-10-CM

## 2013-04-12 DIAGNOSIS — Z860101 Personal history of adenomatous and serrated colon polyps: Secondary | ICD-10-CM

## 2013-04-12 NOTE — Patient Instructions (Addendum)
Follow up as needed                                               We are excited to introduce MyChart, a new best-in-class service that provides you online access to important information in your electronic medical record. We want to make it easier for you to view your health information - all in one secure location - when and where you need it. We expect MyChart will enhance the quality of care and service we provide.  When you register for MyChart, you can:    View your test results.    Request appointments and receive appointment reminders via email.    Request medication renewals.    View your medical history, allergies, medications and immunizations.    Communicate with your physician's office through a password-protected site.    Conveniently print information such as your medication lists.  To find out if MyChart is right for you, please talk to a member of our clinical staff today. We will gladly answer your questions about this free health and wellness tool.  If you are age 18 or older and want a member of your family to have access to your record, you must provide written consent by completing a proxy form available at our office. Please speak to our clinical staff about guidelines regarding accounts for patients younger than age 18.  As you activate your MyChart account and need any technical assistance, please call the MyChart technical support line at [phone] ([phone]) or email your question to [email or web address]. If you email your question(s), please include your name, a return phone number and the best time to reach you.  If you have non-urgent health-related questions, you can send a message to our office through MyChart at [email or web address]. If you have a medical emergency, call 911.  Thank you for using MyChart as your new health and wellness resource!   MyChart licensed from [employer],  1999-2010. Patents Pending.   

## 2013-04-12 NOTE — Progress Notes (Signed)
Subjective:    Patient ID: Jorge Peterson, male    DOB: 09/20/1960, 52 y.o.   MRN: [id number]  HPI Kerim is a 52 yo male with PMH of atrial fibrillation status post ablation in June of 2012, hyperlipidemia, mild sleep apnea, adenomatous colon polyps, and diverticulosis with recurrent diverticulitis who is seen for followup. He has continued to develop symptomatology consistent with diverticulitis approximately every 2-3 months over the last year. He has called the office to get antibiotics on several occasions, and did so most recently about 2 weeks ago.  He reports developing left lower quadrant abdominal discomfort and decreased appetite which has been very consistent with his diverticulitis in the past. He was given a prescription for Augmentin which she completed several days ago. He reports the antibiotics relieved his pain within 48 hours and he completed a course as instructed. Today he feels well. He reports his bowel movements have been normal he denies diarrhea or constipation. No rectal bleeding or melena. Appetite is good. He continues to exercise 3-5 days per week and is able to be very active. No nausea or vomiting, weight loss, fevers or chills.  Review of Systems As per HPI, otherwise normal  Current Medications, Allergies, Past Medical History, Past Surgical History, Family History and Social History were reviewed in [employer] record.     Objective:   Physical Exam BP 136/88  Pulse 60  Ht 5\' 6"  (1.676 m)  Wt 223 lb 8 oz (101.379 kg)  BMI 36.09 kg/m2 Constitutional: Well-developed and well-nourished. No distress. HEENT: Normocephalic and atraumatic. Oropharynx is clear and moist. No oropharyngeal exudate. Conjunctivae are normal.  No scleral icterus. Neck: Neck supple. Trachea midline. Cardiovascular: Normal rate, regular rhythm and intact distal pulses. No M/R/G Pulmonary/chest: Effort normal and breath sounds normal. No wheezing, rales or  rhonchi. Abdominal: Soft, nontender, nondistended. Bowel sounds active throughout.  Extremities: no clubbing, cyanosis, or edema Neurological: Alert and oriented to person place and time. Skin: Skin is warm and dry. No rashes noted. Psychiatric: Normal mood and affect. Behavior is normal.  Colonoscopy, 08/13/2011, Dr. Barnetta Chapel, MD -- examined the cecum with good prep. One 2 mm ascending colon adenoma, diverticulosis sigmoid and descending colon, nonbleeding internal hemorrhoids Colonoscopy, 07/30/2008, Dr. Barnetta Chapel, MD -- exam to the cecum with good prep. One 16 mm bleeding distal sigmoid polyp, diverticulosis descending and sigmoid.  Pathology polyp = villous adenoma     Assessment & Plan:   52 yo male with PMH of atrial fibrillation status post ablation in June of 2012, hyperlipidemia, mild sleep apnea, adenomatous colon polyps, and diverticulosis with recurrent diverticulitis who is seen for followup.  1.  Recurrent uncomplicated diverticulosis -- he has continued to develop early diverticulitis which has been antibiotic responsive to this point every 2-3 months over the last 12-18 months. We had a discussion regarding recurrent diverticulitis and also how sigmoidectomy could help prevent recurrent infection and need for antibiotics. We also discussed how when done in an elective manner often there is primary anastomosis without the need for colostomy, and often can be done laparoscopically.  There is no evidence for ongoing diverticulitis at this point. He would like to think about this more and discuss surgery further with his wife. In the interim he will call and let us know should he develop classic left lower quadrant pain and his previous symptoms of diverticulitis. See #2  2.  History of adenomatous colon polyp -- he is up-to-date with screening colonoscopy, last  exam was less than 2 years ago in December 2012. He had one 2 mm adenoma removed at his last exam. The last exam was  done appropriately 3 years after having a greater than 1 cm villous adenoma removed.  Based on current guidelines for polyp surveillance, he'll be due for repeat screening/surveillance colonoscopy in December 2017.  Return PRN, and if he decides to pursue surgical opinion, referral can be made to [facility]

## 2013-06-19 ENCOUNTER — Telehealth: Payer: Self-pay | Admitting: Internal Medicine

## 2013-06-19 MED ORDER — AMOXICILLIN-POT CLAVULANATE 875-125 MG PO TABS: 1.0000 | ORAL_TABLET | Freq: Two times a day (BID) | ORAL | Status: DC

## 2013-06-19 NOTE — Telephone Encounter (Signed)
Pt last seen on 04/22/13 and he was asked then to consider surgery d/t frequent bouts of Diverticulitis. He is call today requesting meds for another flare. I have not spoken with the pt. Please advise. Thanks,

## 2013-06-19 NOTE — Telephone Encounter (Signed)
Spoke to patient by phone, he has had recurrent left middle to lower quadrant abdominal discomfort. He reports this is very early but very consistent with prior attacks of diverticulitis. No fever. Appetite remains okay. No nausea or vomiting. No blood in the stool. No diarrhea Last time he responded to Augmentin, I will refill Augmentin 875 twice a day x7 days. He'll continue his regular probiotic during and after this course of antibiotics We had previously discussed surgical referral should diverticulitis become either antibiotic unresponsive or more frequent I've asked that he call me if he worsens or doesn't improve quickly. He voiced understanding

## 2013-06-28 ENCOUNTER — Other Ambulatory Visit: Payer: Self-pay

## 2013-06-28 DIAGNOSIS — D381 Neoplasm of uncertain behavior of trachea, bronchus and lung: Secondary | ICD-10-CM

## 2013-07-05 ENCOUNTER — Other Ambulatory Visit: Payer: Self-pay

## 2013-07-18 ENCOUNTER — Ambulatory Visit (INDEPENDENT_AMBULATORY_CARE_PROVIDER_SITE_OTHER): Payer: PRIVATE HEALTH INSURANCE | Admitting: Cardiothoracic Surgery

## 2013-07-18 ENCOUNTER — Ambulatory Visit
Admission: RE | Admit: 2013-07-18 | Discharge: 2013-07-18 | Disposition: A | Discharge status: Home / Self Care | Payer: 59 | Source: Ambulatory Visit | Attending: Cardiothoracic Surgery | Admitting: Cardiothoracic Surgery

## 2013-07-18 ENCOUNTER — Encounter: Payer: Self-pay | Admitting: Cardiothoracic Surgery

## 2013-07-18 VITALS — BP 127/86 | HR 57 | Resp 16 | Ht 67.0 in | Wt 215.0 lb

## 2013-07-18 DIAGNOSIS — Z9889 Other specified postprocedural states: Secondary | ICD-10-CM

## 2013-07-18 DIAGNOSIS — R590 Localized enlarged lymph nodes: Secondary | ICD-10-CM

## 2013-07-18 DIAGNOSIS — R911 Solitary pulmonary nodule: Secondary | ICD-10-CM

## 2013-07-18 DIAGNOSIS — D381 Neoplasm of uncertain behavior of trachea, bronchus and lung: Secondary | ICD-10-CM

## 2013-07-18 DIAGNOSIS — R599 Enlarged lymph nodes, unspecified: Secondary | ICD-10-CM

## 2013-07-18 NOTE — Progress Notes (Signed)
PCP is Jerl Mina, MD Referring Provider is Jerl Mina, MD  Chief Complaint  Patient presents with  . Follow-up    6 month with CT CHEST    HPI: 6 month followup with CT scan of left upper lobe nodular density previously biopsied and shown to have granulomatous histopathology with negative cultures for fungal-AFB. The patient is a nonsmoker. Is no palmar he symptoms.  CT of the chest shows the nodular density has decreased in size from 11 x 19 mm to 11 x 11 mm. The margins are more rounded and less spiculated as well. Several of the sub-centimeter mediastinal nodes have regressed in size as well. No new nodular densities have appeared.  Past Medical History  Diagnosis Date  . Atrial fibrillation     s/p afib ablation 7/12  . Hyperlipidemia   . Bradycardia     asymptomatic  . Diverticulitis   . Hypertension   . Shortness of breath   . GERD (gastroesophageal reflux disease)   . Headache(784.0)   . Family history of anesthesia complication     father gets pneumonia after "    Past Surgical History  Procedure Laterality Date  . Shoulder surgery      x 2  . Back surgery    . Atrial ablation surgery  03/24/11    PVI by JA  . Fistula surgery    . Tonsillectomy    . Bronchoscopy  01/23/2013     biopsy  . Video bronchoscopy with endobronchial ultrasound N/A 01/23/2013    Procedure: VIDEO BRONCHOSCOPY WITH ENDOBRONCHIAL ULTRASOUND;  Surgeon: Kerin Perna, MD;  Location: [facility] OR;  Service: Thoracic;  Laterality: N/A;  . Mediastinoscopy N/A 01/23/2013    Procedure: MEDIASTINOSCOPY;  Surgeon: Kerin Perna, MD;  Location: [facility] OR;  Service: Thoracic;  Laterality: N/A;    Family History  Problem Relation Age of Onset  . Diabetes Father   . Heart disease Father     CABG at 59    Social History History  Substance Use Topics  . Smoking status: Never Smoker   . Smokeless tobacco: Never Used  . Alcohol Use: No    Current Outpatient Prescriptions  Medication Sig Dispense  Refill  . acetaminophen (TYLENOL) 500 MG tablet Take 1,000 mg by mouth 3 (three) times daily as needed for pain.      [name] aspirin EC 81 MG tablet Take 81 mg by mouth daily.      . Biotin 1000 MCG tablet Take 1,000 mcg by mouth daily.      . Coenzyme Q10 (COQ10) 150 MG CAPS Take 150 mg by mouth daily.       . fluticasone (FLONASE) 50 MCG/ACT nasal spray Place 2 sprays into the nose 2 (two) times daily.      . folic acid (FOLVITE) 400 MCG tablet Take 400 mcg by mouth daily.        [name] losartan (COZAAR) 50 MG tablet TAKE 1 TABLET BY MOUTH EVERY DAY  90 tablet  3  . Multiple Vitamin (MULTIVITAMIN) tablet Take 1 tablet by mouth daily.       [name] omeprazole (PRILOSEC) 40 MG capsule Take 40 mg by mouth daily.       . Probiotic Product (PROBIOTIC FORMULA PO) Take 1 capsule by mouth daily.      . simvastatin (ZOCOR) 40 MG tablet Take 40 mg by mouth at bedtime.       . fish oil-omega-3 fatty acids 1000 MG capsule Take 1 g by  mouth daily.        No current facility-administered medications for this visit.    Allergies  Allergen Reactions  . Oxycodone-Acetaminophen Itching and Rash    Review of Systems no pulmonary symptoms no fever night sweats or weight loss  BP 127/86  Pulse 57  Resp 16  Ht 5\' 7"  (1.702 m)  Wt 215 lb (97.523 kg)  BMI 33.67 kg/m2  SpO2 98% Physical Exam Alert and comfortable Lungs clear No palpable adenopathy the neck Heart rate regular  Diagnostic Tests: CT scan shows regression in size the left upper lobe nodule.  Impression: Left upper lobe nodule with biopsy showing granulomatous changes. Initial regression in size over 6 months.  Plan: Continue following this nodule with another repeat CT scan in 8 months.

## 2013-08-09 ENCOUNTER — Ambulatory Visit: Payer: PRIVATE HEALTH INSURANCE | Admitting: Internal Medicine

## 2014-02-08 ENCOUNTER — Other Ambulatory Visit: Payer: Self-pay | Admitting: *Deleted

## 2014-02-08 DIAGNOSIS — R911 Solitary pulmonary nodule: Secondary | ICD-10-CM

## 2014-03-20 ENCOUNTER — Other Ambulatory Visit: Payer: PRIVATE HEALTH INSURANCE

## 2014-03-20 ENCOUNTER — Ambulatory Visit: Payer: PRIVATE HEALTH INSURANCE | Admitting: Cardiothoracic Surgery

## 2014-03-27 ENCOUNTER — Ambulatory Visit (INDEPENDENT_AMBULATORY_CARE_PROVIDER_SITE_OTHER): Payer: PRIVATE HEALTH INSURANCE | Admitting: Cardiothoracic Surgery

## 2014-03-27 ENCOUNTER — Encounter: Payer: Self-pay | Admitting: Cardiothoracic Surgery

## 2014-03-27 ENCOUNTER — Ambulatory Visit
Admission: RE | Admit: 2014-03-27 | Discharge: 2014-03-27 | Disposition: A | Discharge status: Home / Self Care | Payer: PRIVATE HEALTH INSURANCE | Source: Ambulatory Visit | Attending: Cardiothoracic Surgery | Admitting: Cardiothoracic Surgery

## 2014-03-27 VITALS — BP 131/81 | HR 63 | Resp 16 | Ht 67.0 in | Wt 227.0 lb

## 2014-03-27 DIAGNOSIS — R911 Solitary pulmonary nodule: Secondary | ICD-10-CM

## 2014-03-27 NOTE — Progress Notes (Signed)
PCP is [name], MD Referring Provider is [name], MD  Chief Complaint  Patient presents with  . Routine Post Op    8 month f/u of  LULobe nodule with CT CHEST W/O    HPI: Patient returns for followup of a 11 mm round lingular nodule which has been previously evaluated and biopsied is May 2014. Nodule has regressed in size and a  transthoracic biopsy showed multinucleated giant cells consistent with inflammation. Mediastinoscopy and mediastinal lymph nodes last year also were negative. Since the last CT scan 8 months ago the nodule has basically remained unchanged, perhaps 1 mm  smaller. The previous CT scan over a several month interval demonstrated significant decrease in size of  the nodule. Patient is a nonsmoker.  At this point the nodule is felt to be a granuloma with very low risk for malignancy.   The patient is asymptomatic. Past Medical History  Diagnosis Date  . Atrial fibrillation     s/p afib ablation 7/12  . Hyperlipidemia   . Bradycardia     asymptomatic  . Diverticulitis   . Hypertension   . Shortness of breath   . GERD (gastroesophageal reflux disease)   . Headache(784.0)   . Family history of anesthesia complication     father gets pneumonia after "    Past Surgical History  Procedure Laterality Date  . Shoulder surgery      x 2  . Back surgery    . Atrial ablation surgery  03/24/11    PVI by JA  . Fistula surgery    . Tonsillectomy    . Bronchoscopy  01/23/2013     biopsy  . Video bronchoscopy with endobronchial ultrasound N/A 01/23/2013    Procedure: VIDEO BRONCHOSCOPY WITH ENDOBRONCHIAL ULTRASOUND;  Surgeon: Ivin Poot, MD;  Location: [place];  Service: Thoracic;  Laterality: N/A;  . Mediastinoscopy N/A 01/23/2013    Procedure: MEDIASTINOSCOPY;  Surgeon: Ivin Poot, MD;  Location: [facility];  Service: Thoracic;  Laterality: N/A;    Family History  Problem Relation Age of Onset  . Diabetes Father   . Heart disease Father     CABG at 13     Social History History  Substance Use Topics  . Smoking status: Never Smoker   . Smokeless tobacco: Never Used  . Alcohol Use: No    Current Outpatient Prescriptions  Medication Sig Dispense Refill  . acetaminophen (TYLENOL) 500 MG tablet Take 1,000 mg by mouth 3 (three) times daily as needed for pain.      [name] aspirin EC 81 MG tablet Take 81 mg by mouth daily.      . Biotin 1000 MCG tablet Take 1,000 mcg by mouth daily.      . Coenzyme Q10 (COQ10) 150 MG CAPS Take 150 mg by mouth daily.       . fish oil-omega-3 fatty acids 1000 MG capsule Take 1 g by mouth daily.       . fluticasone (FLONASE) 50 MCG/ACT nasal spray Place 2 sprays into the nose 2 (two) times daily.      . folic acid (FOLVITE) 676 MCG tablet Take 400 mcg by mouth daily.        . hydrochlorothiazide (MICROZIDE) 12.5 MG capsule Take 12.5 mg by mouth daily.      [name] losartan (COZAAR) 50 MG tablet TAKE 2 TABLETS A DAY      . Multiple Vitamin (MULTIVITAMIN) tablet Take 1 tablet by mouth daily.       [name]  omeprazole (PRILOSEC) 40 MG capsule Take 40 mg by mouth daily.       . Probiotic Product (PROBIOTIC FORMULA PO) Take 1 capsule by mouth daily.      . simvastatin (ZOCOR) 40 MG tablet Take 40 mg by mouth at bedtime.        No current facility-administered medications for this visit.    Allergies  Allergen Reactions  . Oxycodone-Acetaminophen Itching and Rash    Review of Systems patient denies symptoms or weight loss  BP 131/81  Pulse 63  Resp 16  Ht 5\' 7"  (1.702 m)  Wt 227 lb (102.967 kg)  BMI 35.55 kg/m2  SpO2 98% Physical Exam Alert and oriented Neck without JVD or adenopathy Lungs clear to auscultation Heart rhythm regular without murmur  Diagnostic Tests: CT scan of the chest shows no significant change, perhaps slight decrease in size, of the lingular nodular density compared to the most recent CT scan 8 months ago.  Impression: I've explained to the patient and his wife that this is nodule felt to be  a granuloma and no further screening CT scans are necessary.. I explained to the patient that this nodule probably will be a chronic finding on x-ray in the future.   No further followup needed. Return as needed.

## 2014-04-30 ENCOUNTER — Observation Stay: Payer: Self-pay | Admitting: Surgery

## 2014-04-30 LAB — BASIC METABOLIC PANEL
Anion Gap: 6 — ABNORMAL LOW (ref 7–16)
BUN: 28 mg/dL — AB (ref 7–18)
CALCIUM: 9.1 mg/dL (ref 8.5–10.1)
Chloride: 106 mmol/L (ref 98–107)
Co2: 28 mmol/L (ref 21–32)
Creatinine: 1.38 mg/dL — ABNORMAL HIGH (ref 0.60–1.30)
EGFR (African American): >60
EGFR (Non-African Amer.): 58 — ABNORMAL LOW
Glucose: 122 mg/dL — ABNORMAL HIGH (ref 65–99)
Osmolality: 286 (ref 275–301)
POTASSIUM: 3.8 mmol/L (ref 3.5–5.1)
Sodium: 140 mmol/L (ref 136–145)

## 2014-04-30 LAB — CBC
HCT: 42.6 % (ref 40.0–52.0)
HGB: 13.9 g/dL (ref 13.0–18.0)
MCH: 27.7 pg (ref 26.0–34.0)
MCHC: 32.6 g/dL (ref 32.0–36.0)
MCV: 85 fL (ref 80–100)
PLATELETS: 187 10*3/uL (ref 150–440)
RBC: 5.02 10*6/uL (ref 4.40–5.90)
RDW: 13 % (ref 11.5–14.5)
WBC: 7.8 10*3/uL (ref 3.8–10.6)

## 2014-04-30 LAB — HEPATIC FUNCTION PANEL A (ARMC)
ALBUMIN: 4 g/dL (ref 3.4–5.0)
Alkaline Phosphatase: 87 U/L
BILIRUBIN DIRECT: 0.1 mg/dL (ref 0.00–0.20)
Bilirubin,Total: 0.5 mg/dL (ref 0.2–1.0)
SGOT(AST): 34 U/L (ref 15–37)
SGPT (ALT): 53 U/L
Total Protein: 6.9 g/dL (ref 6.4–8.2)

## 2014-04-30 LAB — TROPONIN I
TROPONIN-I: 0.02 ng/mL
TROPONIN-I: 0.03 ng/mL

## 2014-04-30 LAB — LIPASE, BLOOD: Lipase: 229 U/L (ref 73–393)

## 2014-05-01 ENCOUNTER — Telehealth: Payer: Self-pay | Admitting: Internal Medicine

## 2014-05-01 NOTE — Telephone Encounter (Signed)
Left message for pt to call back  °

## 2014-05-01 NOTE — Telephone Encounter (Signed)
Pt was seen at [facility], thought he was having a "gallbladder attack." States it was not his gallbladder and the surgeon instructed them to schedule a follow-up with Dr. Hilarie Fredrickson. State he may need an EGD. Pts wife states they have the records. Pt scheduled to see Alonza Bogus PA 05/09/14@1 :30pm. Pt aware of appt.

## 2014-05-02 ENCOUNTER — Encounter (HOSPITAL_COMMUNITY)
Admission: EM | Disposition: A | Discharge status: Home / Self Care | Payer: Self-pay | Source: Home / Self Care | Attending: Emergency Medicine

## 2014-05-02 ENCOUNTER — Observation Stay (HOSPITAL_COMMUNITY): Payer: 59 | Admitting: Anesthesiology

## 2014-05-02 ENCOUNTER — Encounter (HOSPITAL_COMMUNITY): Payer: 59 | Admitting: Anesthesiology

## 2014-05-02 ENCOUNTER — Encounter (HOSPITAL_COMMUNITY): Payer: Self-pay | Admitting: Emergency Medicine

## 2014-05-02 ENCOUNTER — Observation Stay (HOSPITAL_COMMUNITY): Payer: 59

## 2014-05-02 ENCOUNTER — Observation Stay (HOSPITAL_COMMUNITY)
Admission: EM | Admit: 2014-05-02 | Discharge: 2014-05-03 | Disposition: A | Discharge status: Home / Self Care | Payer: 59 | Attending: Surgery | Admitting: Surgery

## 2014-05-02 DIAGNOSIS — I4891 Unspecified atrial fibrillation: Secondary | ICD-10-CM | POA: Diagnosis not present

## 2014-05-02 DIAGNOSIS — K810 Acute cholecystitis: Secondary | ICD-10-CM | POA: Diagnosis present

## 2014-05-02 DIAGNOSIS — I498 Other specified cardiac arrhythmias: Secondary | ICD-10-CM | POA: Insufficient documentation

## 2014-05-02 DIAGNOSIS — R51 Headache: Secondary | ICD-10-CM | POA: Insufficient documentation

## 2014-05-02 DIAGNOSIS — K8010 Calculus of gallbladder with chronic cholecystitis without obstruction: Secondary | ICD-10-CM | POA: Diagnosis not present

## 2014-05-02 DIAGNOSIS — I10 Essential (primary) hypertension: Secondary | ICD-10-CM | POA: Diagnosis not present

## 2014-05-02 DIAGNOSIS — Z885 Allergy status to narcotic agent status: Secondary | ICD-10-CM | POA: Diagnosis not present

## 2014-05-02 DIAGNOSIS — K8000 Calculus of gallbladder with acute cholecystitis without obstruction: Secondary | ICD-10-CM | POA: Diagnosis present

## 2014-05-02 DIAGNOSIS — E785 Hyperlipidemia, unspecified: Secondary | ICD-10-CM | POA: Insufficient documentation

## 2014-05-02 DIAGNOSIS — K219 Gastro-esophageal reflux disease without esophagitis: Secondary | ICD-10-CM | POA: Diagnosis not present

## 2014-05-02 DIAGNOSIS — Z8719 Personal history of other diseases of the digestive system: Secondary | ICD-10-CM | POA: Diagnosis not present

## 2014-05-02 HISTORY — PX: CHOLECYSTECTOMY: SHX55

## 2014-05-02 LAB — CBC WITH DIFFERENTIAL/PLATELET
BASOS ABS: 0 10*3/uL (ref 0.0–0.1)
Basophils Relative: 0 % (ref 0–1)
EOS ABS: 0.1 10*3/uL (ref 0.0–0.7)
Eosinophils Relative: 1 % (ref 0–5)
HEMATOCRIT: 43.2 % (ref 39.0–52.0)
Hemoglobin: 14.8 g/dL (ref 13.0–17.0)
Lymphocytes Relative: 17 % (ref 12–46)
Lymphs Abs: 1.9 10*3/uL (ref 0.7–4.0)
MCH: 28.4 pg (ref 26.0–34.0)
MCHC: 34.3 g/dL (ref 30.0–36.0)
MCV: 82.9 fL (ref 78.0–100.0)
MONO ABS: 0.7 10*3/uL (ref 0.1–1.0)
Monocytes Relative: 6 % (ref 3–12)
Neutro Abs: 8.3 10*3/uL — ABNORMAL HIGH (ref 1.7–7.7)
Neutrophils Relative %: 76 % (ref 43–77)
PLATELETS: 195 10*3/uL (ref 150–400)
RBC: 5.21 MIL/uL (ref 4.22–5.81)
RDW: 12.4 % (ref 11.5–15.5)
WBC: 11 10*3/uL — ABNORMAL HIGH (ref 4.0–10.5)

## 2014-05-02 LAB — COMPREHENSIVE METABOLIC PANEL
ALT: 40 U/L (ref 0–53)
AST: 31 U/L (ref 0–37)
Albumin: 4 g/dL (ref 3.5–5.2)
Alkaline Phosphatase: 90 U/L (ref 39–117)
Anion gap: 11 (ref 5–15)
BUN: 16 mg/dL (ref 6–23)
CALCIUM: 9.8 mg/dL (ref 8.4–10.5)
CO2: 29 meq/L (ref 19–32)
CREATININE: 1.18 mg/dL (ref 0.50–1.35)
Chloride: 100 mEq/L (ref 96–112)
GFR, EST AFRICAN AMERICAN: 79 mL/min — AB (ref >90)
GFR, EST NON AFRICAN AMERICAN: 68 mL/min — AB (ref >90)
GLUCOSE: 169 mg/dL — AB (ref 70–99)
Potassium: 4.1 mEq/L (ref 3.7–5.3)
SODIUM: 140 meq/L (ref 137–147)
TOTAL PROTEIN: 7.2 g/dL (ref 6.0–8.3)
Total Bilirubin: 0.4 mg/dL (ref 0.3–1.2)

## 2014-05-02 LAB — LIPASE, BLOOD: LIPASE: 47 U/L (ref 11–59)

## 2014-05-02 SURGERY — LAPAROSCOPIC CHOLECYSTECTOMY WITH INTRAOPERATIVE CHOLANGIOGRAM
Anesthesia: General | Site: Abdomen

## 2014-05-02 MED ORDER — CEFAZOLIN SODIUM-DEXTROSE 2-3 GM-% IV SOLR
INTRAVENOUS | Status: AC
  Filled 2014-05-02: qty 50

## 2014-05-02 MED ORDER — BUPIVACAINE-EPINEPHRINE 0.25% -1:200000 IJ SOLN
INTRAMUSCULAR | Status: DC | PRN
  Administered 2014-05-02: 6 mL

## 2014-05-02 MED ORDER — LIDOCAINE HCL (CARDIAC) 20 MG/ML IV SOLN
INTRAVENOUS | Status: AC
  Filled 2014-05-02: qty 5

## 2014-05-02 MED ORDER — FENTANYL CITRATE 0.05 MG/ML IJ SOLN
25.0000 ug | INTRAMUSCULAR | Status: DC | PRN
  Administered 2014-05-02: 50 ug via INTRAVENOUS

## 2014-05-02 MED ORDER — MIDAZOLAM HCL 2 MG/2ML IJ SOLN
INTRAMUSCULAR | Status: AC
  Filled 2014-05-02: qty 2

## 2014-05-02 MED ORDER — KETOROLAC TROMETHAMINE 30 MG/ML IJ SOLN
30.0000 mg | Freq: Four times a day (QID) | INTRAMUSCULAR | Status: DC | PRN
  Administered 2014-05-02 – 2014-05-03 (×2): 30 mg via INTRAVENOUS
  Filled 2014-05-02 (×2): qty 1

## 2014-05-02 MED ORDER — EPHEDRINE SULFATE 50 MG/ML IJ SOLN
INTRAMUSCULAR | Status: AC
  Filled 2014-05-02: qty 1

## 2014-05-02 MED ORDER — HYDROCODONE-ACETAMINOPHEN 5-325 MG PO TABS
1.0000 | ORAL_TABLET | ORAL | Status: DC | PRN
Start: 2014-05-02 — End: 2014-05-03
  Administered 2014-05-02: 1 via ORAL
  Administered 2014-05-03 (×2): 2 via ORAL
  Filled 2014-05-02: qty 2
  Filled 2014-05-02 (×2): qty 1
  Filled 2014-05-02: qty 2

## 2014-05-02 MED ORDER — GLYCOPYRROLATE 0.2 MG/ML IJ SOLN
INTRAMUSCULAR | Status: DC | PRN
  Administered 2014-05-02: 0.6 mg via INTRAVENOUS

## 2014-05-02 MED ORDER — 0.9 % SODIUM CHLORIDE (POUR BTL) OPTIME
TOPICAL | Status: DC | PRN
  Administered 2014-05-02: 1000 mL

## 2014-05-02 MED ORDER — SODIUM CHLORIDE 0.9 % IV SOLN
Freq: Once | INTRAVENOUS | Status: AC
  Administered 2014-05-02: 03:00:00 via INTRAVENOUS

## 2014-05-02 MED ORDER — SUCCINYLCHOLINE CHLORIDE 20 MG/ML IJ SOLN
INTRAMUSCULAR | Status: DC | PRN
  Administered 2014-05-02: 100 mg via INTRAVENOUS

## 2014-05-02 MED ORDER — BUPIVACAINE-EPINEPHRINE (PF) 0.25% -1:200000 IJ SOLN
INTRAMUSCULAR | Status: AC
  Filled 2014-05-02: qty 30

## 2014-05-02 MED ORDER — PROPOFOL 10 MG/ML IV BOLUS
INTRAVENOUS | Status: AC
  Filled 2014-05-02: qty 20

## 2014-05-02 MED ORDER — GLYCOPYRROLATE 0.2 MG/ML IJ SOLN
INTRAMUSCULAR | Status: AC
  Filled 2014-05-02: qty 3

## 2014-05-02 MED ORDER — PANTOPRAZOLE SODIUM 40 MG IV SOLR
40.0000 mg | Freq: Every day | INTRAVENOUS | Status: DC
  Administered 2014-05-02: 40 mg via INTRAVENOUS
  Filled 2014-05-02 (×2): qty 40

## 2014-05-02 MED ORDER — MORPHINE SULFATE 4 MG/ML IJ SOLN
4.0000 mg | Freq: Once | INTRAMUSCULAR | Status: AC
  Administered 2014-05-02: 4 mg via INTRAVENOUS
  Filled 2014-05-02: qty 1

## 2014-05-02 MED ORDER — ARTIFICIAL TEARS OP OINT
TOPICAL_OINTMENT | OPHTHALMIC | Status: DC | PRN
  Administered 2014-05-02: 1 via OPHTHALMIC

## 2014-05-02 MED ORDER — NEOSTIGMINE METHYLSULFATE 10 MG/10ML IV SOLN
INTRAVENOUS | Status: AC
  Filled 2014-05-02: qty 1

## 2014-05-02 MED ORDER — ONDANSETRON HCL 4 MG/2ML IJ SOLN
4.0000 mg | Freq: Once | INTRAMUSCULAR | Status: AC
  Administered 2014-05-02: 4 mg via INTRAVENOUS
  Filled 2014-05-02: qty 2

## 2014-05-02 MED ORDER — SUCCINYLCHOLINE CHLORIDE 20 MG/ML IJ SOLN
INTRAMUSCULAR | Status: AC
  Filled 2014-05-02: qty 1

## 2014-05-02 MED ORDER — ENOXAPARIN SODIUM 40 MG/0.4ML ~~LOC~~ SOLN
40.0000 mg | SUBCUTANEOUS | Status: DC
  Filled 2014-05-02 (×2): qty 0.4

## 2014-05-02 MED ORDER — STERILE WATER FOR INJECTION IJ SOLN
INTRAMUSCULAR | Status: AC
  Filled 2014-05-02: qty 10

## 2014-05-02 MED ORDER — FENTANYL CITRATE 0.05 MG/ML IJ SOLN
INTRAMUSCULAR | Status: DC | PRN
  Administered 2014-05-02 (×4): 50 ug via INTRAVENOUS

## 2014-05-02 MED ORDER — ONDANSETRON HCL 4 MG/2ML IJ SOLN
INTRAMUSCULAR | Status: DC | PRN
Start: 2014-05-02 — End: 2014-05-02
  Administered 2014-05-02: 4 mg via INTRAVENOUS

## 2014-05-02 MED ORDER — ACETAMINOPHEN 325 MG PO TABS: 650.0000 mg | ORAL_TABLET | ORAL | Status: DC | PRN

## 2014-05-02 MED ORDER — ROCURONIUM BROMIDE 100 MG/10ML IV SOLN
INTRAVENOUS | Status: DC | PRN
  Administered 2014-05-02 (×2): 5 mg via INTRAVENOUS
  Administered 2014-05-02: 10 mg via INTRAVENOUS
  Administered 2014-05-02: 25 mg via INTRAVENOUS

## 2014-05-02 MED ORDER — GLYCOPYRROLATE 0.2 MG/ML IJ SOLN
INTRAMUSCULAR | Status: AC
  Filled 2014-05-02: qty 2

## 2014-05-02 MED ORDER — ONDANSETRON HCL 4 MG/2ML IJ SOLN
INTRAMUSCULAR | Status: AC
  Filled 2014-05-02: qty 2

## 2014-05-02 MED ORDER — EPHEDRINE SULFATE 50 MG/ML IJ SOLN
INTRAMUSCULAR | Status: DC | PRN
  Administered 2014-05-02 (×2): 10 mg via INTRAVENOUS

## 2014-05-02 MED ORDER — MIDAZOLAM HCL 5 MG/5ML IJ SOLN
INTRAMUSCULAR | Status: DC | PRN
  Administered 2014-05-02 (×2): 1 mg via INTRAVENOUS

## 2014-05-02 MED ORDER — FENTANYL CITRATE 0.05 MG/ML IJ SOLN
INTRAMUSCULAR | Status: AC
  Filled 2014-05-02: qty 2

## 2014-05-02 MED ORDER — ESMOLOL HCL 10 MG/ML IV SOLN
INTRAVENOUS | Status: DC | PRN
  Administered 2014-05-02: 20 mg via INTRAVENOUS

## 2014-05-02 MED ORDER — HYDROCHLOROTHIAZIDE 12.5 MG PO CAPS
12.5000 mg | ORAL_CAPSULE | Freq: Every day | ORAL | Status: DC
  Administered 2014-05-03: 12.5 mg via ORAL
  Filled 2014-05-02 (×2): qty 1

## 2014-05-02 MED ORDER — VECURONIUM BROMIDE 10 MG IV SOLR
INTRAVENOUS | Status: AC
  Filled 2014-05-02: qty 10

## 2014-05-02 MED ORDER — POTASSIUM CHLORIDE IN NACL 20-0.9 MEQ/L-% IV SOLN
INTRAVENOUS | Status: DC
  Administered 2014-05-02 – 2014-05-03 (×2): via INTRAVENOUS
  Filled 2014-05-02 (×4): qty 1000

## 2014-05-02 MED ORDER — DEXTROSE IN LACTATED RINGERS 5 % IV SOLN: INTRAVENOUS | Status: DC

## 2014-05-02 MED ORDER — LACTATED RINGERS IV SOLN
INTRAVENOUS | Status: DC
  Administered 2014-05-02: 50 mL/h via INTRAVENOUS

## 2014-05-02 MED ORDER — MORPHINE SULFATE 2 MG/ML IJ SOLN: 2.0000 mg | INTRAMUSCULAR | Status: DC | PRN

## 2014-05-02 MED ORDER — PIPERACILLIN-TAZOBACTAM 3.375 G IVPB
3.3750 g | Freq: Three times a day (TID) | INTRAVENOUS | Status: DC
  Administered 2014-05-02 – 2014-05-03 (×3): 3.375 g via INTRAVENOUS
  Filled 2014-05-02 (×5): qty 50

## 2014-05-02 MED ORDER — HYDROMORPHONE HCL PF 1 MG/ML IJ SOLN
1.0000 mg | INTRAMUSCULAR | Status: DC | PRN
  Administered 2014-05-02 (×2): 1 mg via INTRAVENOUS
  Filled 2014-05-02 (×2): qty 1

## 2014-05-02 MED ORDER — ONDANSETRON HCL 4 MG/2ML IJ SOLN: 4.0000 mg | Freq: Four times a day (QID) | INTRAMUSCULAR | Status: DC | PRN

## 2014-05-02 MED ORDER — LIDOCAINE HCL (CARDIAC) 20 MG/ML IV SOLN
INTRAVENOUS | Status: DC | PRN
  Administered 2014-05-02: 60 mg via INTRAVENOUS

## 2014-05-02 MED ORDER — LACTATED RINGERS IV SOLN
INTRAVENOUS | Status: DC | PRN
  Administered 2014-05-02 (×2): via INTRAVENOUS

## 2014-05-02 MED ORDER — SODIUM CHLORIDE 0.9 % IV SOLN
INTRAVENOUS | Status: DC | PRN
  Administered 2014-05-02: 12:00:00

## 2014-05-02 MED ORDER — SODIUM CHLORIDE 0.9 % IJ SOLN
INTRAMUSCULAR | Status: AC
  Filled 2014-05-02: qty 10

## 2014-05-02 MED ORDER — ENOXAPARIN SODIUM 40 MG/0.4ML ~~LOC~~ SOLN
40.0000 mg | SUBCUTANEOUS | Status: DC
  Administered 2014-05-03: 40 mg via SUBCUTANEOUS
  Filled 2014-05-02: qty 0.4

## 2014-05-02 MED ORDER — SODIUM CHLORIDE 0.9 % IR SOLN
Status: DC | PRN
  Administered 2014-05-02 (×2): 1

## 2014-05-02 MED ORDER — NEOSTIGMINE METHYLSULFATE 10 MG/10ML IV SOLN
INTRAVENOUS | Status: DC | PRN
  Administered 2014-05-02: 4 mg via INTRAVENOUS

## 2014-05-02 MED ORDER — CEFAZOLIN SODIUM-DEXTROSE 2-3 GM-% IV SOLR
2.0000 g | Freq: Once | INTRAVENOUS | Status: AC
  Administered 2014-05-02: 2 g via INTRAVENOUS
  Filled 2014-05-02: qty 50

## 2014-05-02 MED ORDER — MORPHINE SULFATE 4 MG/ML IJ SOLN
6.0000 mg | Freq: Once | INTRAMUSCULAR | Status: AC
  Administered 2014-05-02: 6 mg via INTRAVENOUS
  Filled 2014-05-02: qty 2

## 2014-05-02 MED ORDER — ROCURONIUM BROMIDE 50 MG/5ML IV SOLN
INTRAVENOUS | Status: AC
  Filled 2014-05-02: qty 1

## 2014-05-02 MED ORDER — PROPOFOL 10 MG/ML IV BOLUS
INTRAVENOUS | Status: DC | PRN
  Administered 2014-05-02: 190 mg via INTRAVENOUS

## 2014-05-02 MED ORDER — KETOROLAC TROMETHAMINE 30 MG/ML IJ SOLN
INTRAMUSCULAR | Status: AC
  Filled 2014-05-02: qty 1

## 2014-05-02 MED ORDER — FENTANYL CITRATE 0.05 MG/ML IJ SOLN
INTRAMUSCULAR | Status: AC
  Filled 2014-05-02: qty 5

## 2014-05-02 MED ORDER — ESMOLOL HCL 10 MG/ML IV SOLN
INTRAVENOUS | Status: AC
  Filled 2014-05-02: qty 10

## 2014-05-02 SURGICAL SUPPLY — 49 items
ADH SKN CLS APL DERMABOND .7 (GAUZE/BANDAGES/DRESSINGS) ×1
APPLIER CLIP ROT 10 11.4 M/L (STAPLE) ×2
APR CLP MED LRG 11.4X10 (STAPLE) ×1
BAG SPEC RTRVL LRG 6X4 10 (ENDOMECHANICALS) ×1
BLADE SURG ROTATE 9660 (MISCELLANEOUS) IMPLANT
CANISTER SUCTION 2500CC (MISCELLANEOUS) ×2 IMPLANT
CHLORAPREP W/TINT 26ML (MISCELLANEOUS) ×2 IMPLANT
CLIP APPLIE ROT 10 11.4 M/L (STAPLE) ×1 IMPLANT
COVER MAYO STAND STRL (DRAPES) ×2 IMPLANT
COVER SURGICAL LIGHT HANDLE (MISCELLANEOUS) ×2 IMPLANT
DECANTER SPIKE VIAL GLASS SM (MISCELLANEOUS) ×4 IMPLANT
DERMABOND ADVANCED (GAUZE/BANDAGES/DRESSINGS) ×1
DERMABOND ADVANCED .7 DNX12 (GAUZE/BANDAGES/DRESSINGS) ×1 IMPLANT
DRAPE C-ARM 42X72 X-RAY (DRAPES) ×2 IMPLANT
DRAPE UTILITY 15X26 W/TAPE STR (DRAPE) ×4 IMPLANT
DRAPE WARM FLUID 44X44 (DRAPE) ×2 IMPLANT
ELECT REM PT RETURN 9FT ADLT (ELECTROSURGICAL) ×2
ELECTRODE REM PT RTRN 9FT ADLT (ELECTROSURGICAL) ×1 IMPLANT
GLOVE BIO SURGEON STRL SZ 6 (GLOVE) ×2 IMPLANT
GLOVE BIO SURGEON STRL SZ8 (GLOVE) ×2 IMPLANT
GLOVE BIOGEL PI IND STRL 6.5 (GLOVE) IMPLANT
GLOVE BIOGEL PI IND STRL 7.0 (GLOVE) IMPLANT
GLOVE BIOGEL PI IND STRL 8 (GLOVE) ×1 IMPLANT
GLOVE BIOGEL PI INDICATOR 6.5 (GLOVE) ×1
GLOVE BIOGEL PI INDICATOR 7.0 (GLOVE) ×3
GLOVE BIOGEL PI INDICATOR 8 (GLOVE) ×1
GLOVE SURG SS PI 6.5 STRL IVOR (GLOVE) ×1 IMPLANT
GLOVE SURG SS PI 7.0 STRL IVOR (GLOVE) ×2 IMPLANT
GOWN STRL REUS W/ TWL LRG LVL3 (GOWN DISPOSABLE) ×4 IMPLANT
GOWN STRL REUS W/ TWL XL LVL3 (GOWN DISPOSABLE) ×1 IMPLANT
GOWN STRL REUS W/TWL LRG LVL3 (GOWN DISPOSABLE) ×8
GOWN STRL REUS W/TWL XL LVL3 (GOWN DISPOSABLE) ×2
KIT BASIN OR (CUSTOM PROCEDURE TRAY) ×2 IMPLANT
KIT ROOM TURNOVER OR (KITS) ×2 IMPLANT
NS IRRIG 1000ML POUR BTL (IV SOLUTION) ×4 IMPLANT
PAD ARMBOARD 7.5X6 YLW CONV (MISCELLANEOUS) ×2 IMPLANT
POUCH SPECIMEN RETRIEVAL 10MM (ENDOMECHANICALS) ×2 IMPLANT
SCISSORS LAP 5X35 DISP (ENDOMECHANICALS) ×2 IMPLANT
SET CHOLANGIOGRAPH 5 50 .035 (SET/KITS/TRAYS/PACK) ×2 IMPLANT
SET IRRIG TUBING LAPAROSCOPIC (IRRIGATION / IRRIGATOR) ×2 IMPLANT
SLEEVE ENDOPATH XCEL 5M (ENDOMECHANICALS) ×2 IMPLANT
SPECIMEN JAR SMALL (MISCELLANEOUS) ×2 IMPLANT
SUT MNCRL AB 4-0 PS2 18 (SUTURE) ×2 IMPLANT
TOWEL OR 17X24 6PK STRL BLUE (TOWEL DISPOSABLE) ×2 IMPLANT
TOWEL OR 17X26 10 PK STRL BLUE (TOWEL DISPOSABLE) ×2 IMPLANT
TRAY LAPAROSCOPIC (CUSTOM PROCEDURE TRAY) ×2 IMPLANT
TROCAR XCEL BLUNT TIP 100MML (ENDOMECHANICALS) ×2 IMPLANT
TROCAR XCEL NON-BLD 11X100MML (ENDOMECHANICALS) ×2 IMPLANT
TROCAR XCEL NON-BLD 5MMX100MML (ENDOMECHANICALS) ×2 IMPLANT

## 2014-05-02 NOTE — ED Notes (Signed)
Surgeon at bedside.  

## 2014-05-02 NOTE — ED Notes (Signed)
Attempted call report 

## 2014-05-02 NOTE — Transfer of Care (Signed)
Immediate Anesthesia Transfer of Care Note  Patient: Jorge Peterson  Procedure(s) Performed: Procedure(s): LAPAROSCOPIC CHOLECYSTECTOMY WITH INTRAOPERATIVE CHOLANGIOGRAM (N/A)  Patient Location: PACU  Anesthesia Type:General  Level of Consciousness: awake, alert  and oriented  Airway & Oxygen Therapy: Patient Spontanous Breathing and Patient connected to nasal cannula oxygen  Post-op Assessment: Report given to PACU RN and Post -op Vital signs reviewed and stable  Post vital signs: Reviewed and stable  Complications: No apparent anesthesia complications

## 2014-05-02 NOTE — Anesthesia Preprocedure Evaluation (Addendum)
Anesthesia Evaluation  Patient identified by MRN, date of birth, ID band Patient awake    Reviewed: Allergy & Precautions, H&P , NPO status , Patient's Chart, lab work & pertinent test results  Airway Mallampati: II TM Distance: >3 FB Neck ROM: Full    Dental  (+) Teeth Intact, Dental Advisory Given   Pulmonary shortness of breath,          Cardiovascular hypertension, Pt. on medications + dysrhythmias (s/p ablation 2012) Atrial Fibrillation Rhythm:Regular Rate:Normal  '12 ECHO: EF 60%, valves OK   Neuro/Psych    GI/Hepatic GERD-  Medicated,  Endo/Other  Morbid obesity  Renal/GU      Musculoskeletal   Abdominal   Peds  Hematology   Anesthesia Other Findings   Reproductive/Obstetrics                       Anesthesia Physical Anesthesia Plan  ASA: III  Anesthesia Plan: General   Post-op Pain Management:    Induction:   Airway Management Planned: Oral ETT  Additional Equipment:   Intra-op Plan:   Post-operative Plan: Extubation in OR  Informed Consent: I have reviewed the patients History and Physical, chart, labs and discussed the procedure including the risks, benefits and alternatives for the proposed anesthesia with the patient or authorized representative who has indicated his/her understanding and acceptance.   Dental advisory given  Plan Discussed with: CRNA and Anesthesiologist  Anesthesia Plan Comments:         Anesthesia Quick Evaluation

## 2014-05-02 NOTE — ED Notes (Signed)
Pam from OR called. Pt. To be transferred to the Short ST[facility] unit at 8:30 am

## 2014-05-02 NOTE — ED Provider Notes (Signed)
CSN: [id number]     Arrival date & time 05/02/14  0204 History   First MD Initiated Contact with Patient 05/02/14 0208     Chief Complaint  Patient presents with  . Abdominal Pain     (Consider location/radiation/quality/duration/timing/severity/associated sxs/prior Treatment) HPI Comments: On Tuesday had acute onset upper abdominal pain with nausea seen and admitted to [facility]  After CT scan showed gall stones but ultrasound showed only sludge.  Was seen by surgeon- no surgery scheduled and referred back to his GI   Tonight about 2 hours after eating had return of pain and nausea   Patient is a 53 y.o. male presenting with abdominal pain. The history is provided by the patient.  Abdominal Pain Pain location:  Epigastric and RUQ Pain quality: stabbing   Pain radiates to:  Does not radiate Pain severity:  Severe Onset quality:  Gradual Timing:  Constant Progression:  Worsening Chronicity:  Recurrent Context: eating   Relieved by:  None tried Worsened by:  Nothing tried Ineffective treatments:  Belching Associated symptoms: nausea   Associated symptoms: no chills, no fever, no shortness of breath and no vomiting     Past Medical History  Diagnosis Date  . Atrial fibrillation     s/p afib ablation 7/12  . Hyperlipidemia   . Bradycardia     asymptomatic  . Diverticulitis   . Hypertension   . Shortness of breath   . GERD (gastroesophageal reflux disease)   . Headache(784.0)   . Family history of anesthesia complication     father gets pneumonia after "   Past Surgical History  Procedure Laterality Date  . Shoulder surgery      x 2  . Back surgery    . Atrial ablation surgery  03/24/11    PVI by JA  . Fistula surgery    . Tonsillectomy    . Bronchoscopy  01/23/2013     biopsy  . Video bronchoscopy with endobronchial ultrasound N/A 01/23/2013    Procedure: VIDEO BRONCHOSCOPY WITH ENDOBRONCHIAL ULTRASOUND;  Surgeon: Ivin Poot, MD;  Location: [place];  Service: Thoracic;   Laterality: N/A;  . Mediastinoscopy N/A 01/23/2013    Procedure: MEDIASTINOSCOPY;  Surgeon: Ivin Poot, MD;  Location: [place];  Service: Thoracic;  Laterality: N/A;  . Cholecystectomy  05/02/2014    LAP CHOLE  . Cholecystectomy N/A 05/02/2014    Procedure: LAPAROSCOPIC CHOLECYSTECTOMY WITH INTRAOPERATIVE CHOLANGIOGRAM;  Surgeon: Erroll Luna, MD;  Location: [place] [facility];  Service: General;  Laterality: N/A;   Family History  Problem Relation Age of Onset  . Diabetes Father   . Heart disease Father     CABG at 46   History  Substance Use Topics  . Smoking status: Never Smoker   . Smokeless tobacco: Never Used  . Alcohol Use: No    Review of Systems  Constitutional: Negative for fever and chills.  Respiratory: Negative for shortness of breath.   Gastrointestinal: Positive for nausea and abdominal pain. Negative for vomiting.  Skin: Positive for rash.  All other systems reviewed and are negative.     Allergies  Oxycodone-acetaminophen  Home Medications   Prior to Admission medications   Medication Sig Start Date End Date Taking? Authorizing Provider  acetaminophen (TYLENOL) 500 MG tablet Take 1,000 mg by mouth 3 (three) times daily as needed for pain.   Yes Historical Provider, MD  aspirin EC 81 MG tablet Take 81 mg by mouth daily.   Yes Historical Provider, MD  Biotin  1000 MCG tablet Take 1,000 mcg by mouth daily.   Yes Historical Provider, MD  Coenzyme Q10 (COQ10) 150 MG CAPS Take 150 mg by mouth daily.    Yes Historical Provider, MD  fish oil-omega-3 fatty acids 1000 MG capsule Take 1 g by mouth daily.    Yes Historical Provider, MD  fluticasone (FLONASE) 50 MCG/ACT nasal spray Place 1 spray into the nose daily as needed for allergies.    Yes Historical Provider, MD  folic acid (FOLVITE) 101 MCG tablet Take 400 mcg by mouth daily.     Yes Historical Provider, MD  hydrochlorothiazide (MICROZIDE) 12.5 MG capsule Take 12.5 mg by mouth daily.   Yes Historical Provider, MD   losartan (COZAAR) 100 MG tablet Take 100 mg by mouth daily.   Yes Historical Provider, MD  Multiple Vitamin (MULTIVITAMIN) tablet Take 1 tablet by mouth daily.    Yes Historical Provider, MD  omeprazole (PRILOSEC) 40 MG capsule Take 40 mg by mouth daily.  03/06/12  Yes Historical Provider, MD  Probiotic Product (PROBIOTIC FORMULA PO) Take 1 capsule by mouth daily.   Yes Historical Provider, MD  simvastatin (ZOCOR) 40 MG tablet Take 40 mg by mouth at bedtime.    Yes Historical Provider, MD  HYDROcodone-acetaminophen (NORCO/VICODIN) 5-325 MG per tablet Take 1-2 tablets by mouth every 4 (four) hours as needed for moderate pain. 05/03/14   Lisette Abu, PA-C   BP 107/49  Pulse 64  Temp(Src) 97.9 F (36.6 C) (Oral)  Resp 16  Ht 5\' 7"  (1.702 m)  Wt 227 lb (102.967 kg)  BMI 35.55 kg/m2  SpO2 90% Physical Exam  Nursing note and vitals reviewed. Constitutional: He is oriented to person, place, and time. He appears well-developed and well-nourished.  obese  HENT:  Head: Normocephalic.  Eyes: Pupils are equal, round, and reactive to light.  Neck: Normal range of motion.  Cardiovascular: Normal rate and regular rhythm.   Pulmonary/Chest: Effort normal.  Abdominal: He exhibits no distension.  Musculoskeletal: Normal range of motion.  Neurological: He is alert and oriented to person, place, and time.  Skin: Skin is warm. There is pallor.    ED Course  Procedures (including critical care time) Labs Review Labs Reviewed  CBC WITH DIFFERENTIAL - Abnormal; Notable for the following:    WBC 11.0 (*)    Neutro Abs 8.3 (*)    All other components within normal limits  COMPREHENSIVE METABOLIC PANEL - Abnormal; Notable for the following:    Glucose, Bld 169 (*)    GFR calc non Af Amer 68 (*)    GFR calc Af Amer 79 (*)    All other components within normal limits  CBC - Abnormal; Notable for the following:    Hemoglobin 12.6 (*)    HCT 38.6 (*)    All other components within normal limits   COMPREHENSIVE METABOLIC PANEL - Abnormal; Notable for the following:    Glucose, Bld 145 (*)    Creatinine, Ser 1.38 (*)    Total Protein 5.9 (*)    Albumin 3.1 (*)    AST 69 (*)    ALT 97 (*)    GFR calc non Af Amer 57 (*)    GFR calc Af Amer 66 (*)    All other components within normal limits  LIPASE, BLOOD  SURGICAL PATHOLOGY    Imaging Review No results found.   EKG Interpretation None      MDM   Final diagnoses:  None  Garald Balding, NP 05/04/14 [phone]

## 2014-05-02 NOTE — Anesthesia Procedure Notes (Signed)
Procedure Name: Intubation Date/Time: 05/02/2014 11:13 AM Performed by: Susa Loffler Pre-anesthesia Checklist: Patient identified, Timeout performed, Emergency Drugs available, Suction available and Patient being monitored Patient Re-evaluated:Patient Re-evaluated prior to inductionOxygen Delivery Method: Circle system utilized Preoxygenation: Pre-oxygenation with 100% oxygen Intubation Type: IV induction Laryngoscope Size: Mac and 4 Grade View: Grade I Tube type: Oral Tube size: 7.5 mm Number of attempts: 1 Airway Equipment and Method: Stylet Placement Confirmation: ETT inserted through vocal cords under direct vision,  positive ETCO2 and breath sounds checked- equal and bilateral Secured at: 23 cm Tube secured with: Tape Dental Injury: Teeth and Oropharynx as per pre-operative assessment

## 2014-05-02 NOTE — Anesthesia Postprocedure Evaluation (Signed)
  Anesthesia Post-op Note  Patient: Jorge Peterson  Procedure(s) Performed: Procedure(s): LAPAROSCOPIC CHOLECYSTECTOMY WITH INTRAOPERATIVE CHOLANGIOGRAM (N/A)  Patient Location: PACU  Anesthesia Type:General  Level of Consciousness: awake, alert  and oriented  Airway and Oxygen Therapy: Patient Spontanous Breathing  Post-op Pain: none  Post-op Assessment: Post-op Vital signs reviewed  Post-op Vital Signs: Reviewed  Last Vitals:  Filed Vitals:   05/02/14 1345  BP: 119/56  Pulse: 62  Temp:   Resp: 17    Complications: No apparent anesthesia complications

## 2014-05-02 NOTE — Op Note (Signed)
Laparoscopic Cholecystectomy with IOC Procedure Note  Indications: This patient presents with symptomatic gallbladder disease and will undergo laparoscopic cholecystectomy.The procedure has been discussed with the patient. Operative and non operative treatments have been discussed. Risks of surgery include bleeding, infection,  Common bile duct injury,  Injury to the stomach,liver, colon,small intestine, abdominal wall,  Diaphragm,  Major blood vessels,  And the need for an open procedure.  Other risks include worsening of medical problems, death,  DVT and pulmonary embolism, and cardiovascular events.   Medical options have also been discussed. The patient has been informed of long term expectations of surgery and non surgical options,  The patient agrees to proceed.    Pre-operative Diagnosis: Calculus of gallbladder with acute cholecystitis, without mention of obstruction  Post-operative Diagnosis: Same  Surgeon: Maame Dack A.   Assistants: Hitchcock RNFA  Anesthesia: General endotracheal anesthesia and Local anesthesia 0.25.% bupivacaine, with epinephrine  ASA Class: 3  Procedure Details  The patient was seen again in the Holding Room. The risks, benefits, complications, treatment options, and expected outcomes were discussed with the patient. The possibilities of reaction to medication, pulmonary aspiration, perforation of viscus, bleeding, recurrent infection, finding a normal gallbladder, the need for additional procedures, failure to diagnose a condition, the possible need to convert to an open procedure, and creating a complication requiring transfusion or operation were discussed with the patient. The patient and/or family concurred with the proposed plan, giving informed consent. The site of surgery properly noted/marked. The patient was taken to Operating Room, identified as Jorge Peterson and the procedure verified as Laparoscopic Cholecystectomy with Intraoperative  Cholangiograms. A Time Out was held and the above information confirmed.  Prior to the induction of general anesthesia, antibiotic prophylaxis was administered. General endotracheal anesthesia was then administered and tolerated well. After the induction, the abdomen was prepped in the usual sterile fashion. The patient was positioned in the supine position with the left arm comfortably tucked, along with some reverse Trendelenburg.  Local anesthetic agent was injected into the skin near the umbilicus and an incision made. The midline fascia was incised and the Hasson technique was used to introduce a 12 mm port under direct vision. It was secured with a figure of eight Vicryl suture placed in the usual fashion. Pneumoperitoneum was then created with CO2 and tolerated well without any adverse changes in the patient's vital signs. Additional trocars were introduced under direct vision with an 11 mm trocar in the epigastrium and two  5 mm trocars in the right upper quadrant. All skin incisions were infiltrated with a local anesthetic agent before making the incision and placing the trocars.   The gallbladder was identified, the fundus grasped and retracted cephalad. Adhesions were lysed bluntly and with the electrocautery where indicated, taking care not to injure any adjacent organs or viscus. The infundibulum was grasped and retracted laterally, exposing the peritoneum overlying the triangle of Calot. This was then divided and exposed in a blunt fashion. The cystic duct was clearly identified and bluntly dissected circumferentially. The junctions of the gallbladder, cystic duct and common bile duct were clearly identified prior to the division of any linear structure.   An incision was made in the cystic duct and the cholangiogram catheter introduced. The catheter was secured using an endoclip. The study showed no stones and good visualization of the distal and proximal biliary tree. The catheter was then  removed.   The cystic duct was then  ligated with surgical clips  on the patient  side and  clipped on the gallbladder side and divided. The cystic artery was identified, dissected free, ligated with clips and divided as well. Posterior cystic artery clipped and divided.  The gallbladder was dissected from the liver bed in retrograde fashion with the electrocautery. The gallbladder was removed. The liver bed was irrigated and inspected. Hemostasis was achieved with the electrocautery. Copious irrigation was utilized and was repeatedly aspirated until clear all particulate matter. Hemostasis was achieved with no signs  Of bleeding or bile leakage.  Pneumoperitoneum was completely reduced after viewing removal of the trocars under direct vision. The wound was thoroughly irrigated and the fascia was then closed with a figure of eight suture; the skin was then closed with 4 O monocryl  and a sterile dressing of Dermabond was  applied.  Instrument, sponge, and needle counts were correct at closure and at the conclusion of the case.   Findings: Cholecystitis with Cholelithiasis  Estimated Blood Loss: Minimal         Drains: none         Total IV Fluids: 500 mL         Specimens: Gallbladder           Complications: None; patient tolerated the procedure well.         Disposition: PACU - hemodynamically stable.         Condition: stable

## 2014-05-02 NOTE — ED Notes (Signed)
This RN has sent a fax to [facility] to request records from his recent visit.

## 2014-05-02 NOTE — Interval H&P Note (Signed)
History and Physical Interval Note:  05/02/2014 10:57 AM  Jorge Peterson  has presented today for surgery, with the diagnosis of cholecystitis  The various methods of treatment have been discussed with the patient and family. After consideration of risks, benefits and other options for treatment, the patient has consented to  Procedure(s): LAPAROSCOPIC CHOLECYSTECTOMY WITH INTRAOPERATIVE CHOLANGIOGRAM (N/A) as a surgical intervention .  The patient's history has been reviewed, patient examined, no change in status, stable for surgery.  I have reviewed the patient's chart and labs.  Questions were answered to the patient's satisfaction.     Daphnie Venturini A.

## 2014-05-02 NOTE — H&P (Signed)
Jorge Peterson is an 53 y.o. male.   Chief Complaint: epigastric abdominal pain HPI: this is a pleasant gentleman who presents with a three-day history of epigastric abdominal pain hurting through to his back. He had the sudden onset of severe, sharp epigastric abdominal pain on Tuesday. He reports occurred all the way through to his back. He presented to [facility]. Per his report, he had a CAT scan of the abdomen and pelvis which showed a gallstone impacted in the gallbladder neck and inflammation. He was admitted to the hospital. He then had an ultrasound which apparently showed only sludge. This was followed by a HIDA scan which apparently was normal. He was discharged home the following day apparently improved. Upon returning home, however, he redeveloped sharp abdominal pain and presented to [facility].  Currently he remains uncomfortable. He again denies nausea or vomiting. He has no chest pain or shortness of breath. He is otherwise without complaints.  Past Medical History  Diagnosis Date  . Atrial fibrillation     s/p afib ablation 7/12  . Hyperlipidemia   . Bradycardia     asymptomatic  . Diverticulitis   . Hypertension   . Shortness of breath   . GERD (gastroesophageal reflux disease)   . Headache(784.0)   . Family history of anesthesia complication     father gets pneumonia after "    Past Surgical History  Procedure Laterality Date  . Shoulder surgery      x 2  . Back surgery    . Atrial ablation surgery  03/24/11    PVI by JA  . Fistula surgery    . Tonsillectomy    . Bronchoscopy  01/23/2013     biopsy  . Video bronchoscopy with endobronchial ultrasound N/A 01/23/2013    Procedure: VIDEO BRONCHOSCOPY WITH ENDOBRONCHIAL ULTRASOUND;  Surgeon: Ivin Poot, MD;  Location: [place];  Service: Thoracic;  Laterality: N/A;  . Mediastinoscopy N/A 01/23/2013    Procedure: MEDIASTINOSCOPY;  Surgeon: Ivin Poot, MD;  Location: [facility];  Service:  Thoracic;  Laterality: N/A;    Family History  Problem Relation Age of Onset  . Diabetes Father   . Heart disease Father     CABG at 16   Social History:  reports that he has never smoked. He has never used smokeless tobacco. He reports that he does not drink alcohol or use illicit drugs.  Allergies:  Allergies  Allergen Reactions  . Oxycodone-Acetaminophen Itching and Rash     (Not in a hospital admission)  Results for orders placed during the hospital encounter of 05/02/14 (from the past 48 hour(s))  CBC WITH DIFFERENTIAL     Status: Abnormal   Collection Time    05/02/14  2:18 AM      Result Value Ref Range   WBC 11.0 (*) 4.0 - 10.5 K/uL   RBC 5.21  4.22 - 5.81 MIL/uL   Hemoglobin 14.8  13.0 - 17.0 g/dL   HCT 43.2  39.0 - 52.0 %   MCV 82.9  78.0 - 100.0 fL   MCH 28.4  26.0 - 34.0 pg   MCHC 34.3  30.0 - 36.0 g/dL   RDW 12.4  11.5 - 15.5 %   Platelets 195  150 - 400 K/uL   Neutrophils Relative % 76  43 - 77 %   Neutro Abs 8.3 (*) 1.7 - 7.7 K/uL   Lymphocytes Relative 17  12 - 46 %   Lymphs  Abs 1.9  0.7 - 4.0 K/uL   Monocytes Relative 6  3 - 12 %   Monocytes Absolute 0.7  0.1 - 1.0 K/uL   Eosinophils Relative 1  0 - 5 %   Eosinophils Absolute 0.1  0.0 - 0.7 K/uL   Basophils Relative 0  0 - 1 %   Basophils Absolute 0.0  0.0 - 0.1 K/uL  COMPREHENSIVE METABOLIC PANEL     Status: Abnormal   Collection Time    05/02/14  2:18 AM      Result Value Ref Range   Sodium 140  137 - 147 mEq/L   Potassium 4.1  3.7 - 5.3 mEq/L   Chloride 100  96 - 112 mEq/L   CO2 29  19 - 32 mEq/L   Glucose, Bld 169 (*) 70 - 99 mg/dL   BUN 16  6 - 23 mg/dL   Creatinine, Ser 1.18  0.50 - 1.35 mg/dL   Calcium 9.8  8.4 - 10.5 mg/dL   Total Protein 7.2  6.0 - 8.3 g/dL   Albumin 4.0  3.5 - 5.2 g/dL   AST 31  0 - 37 U/L   ALT 40  0 - 53 U/L   Alkaline Phosphatase 90  39 - 117 U/L   Total Bilirubin 0.4  0.3 - 1.2 mg/dL   GFR calc non Af Amer 68 (*) >90 mL/min   GFR calc Af Amer 79 (*) >90  mL/min   Comment: (NOTE)     The eGFR has been calculated using the CKD EPI equation.     This calculation has not been validated in all clinical situations.     eGFR's persistently <90 mL/min signify possible Chronic Kidney     Disease.   Anion gap 11  5 - 15  LIPASE, BLOOD     Status: None   Collection Time    05/02/14  2:18 AM      Result Value Ref Range   Lipase 47  11 - 59 U/L   No results found.  Review of Systems  All other systems reviewed and are negative.   Blood pressure 149/85, pulse 49, temperature 97.7 F (36.5 C), temperature source Oral, SpO2 96.00%. Physical Exam  Constitutional: He is oriented to person, place, and time. He appears well-developed and well-nourished. He appears distressed.  HENT:  Head: Normocephalic and atraumatic.  Right Ear: External ear normal.  Left Ear: External ear normal.  Nose: Nose normal.  Mouth/Throat: Oropharynx is clear and moist. No oropharyngeal exudate.  Eyes: Conjunctivae are normal. Pupils are equal, round, and reactive to light. Right eye exhibits no discharge. Left eye exhibits no discharge. No scleral icterus.  Neck: Normal range of motion. Neck supple. No tracheal deviation present.  Cardiovascular: Regular rhythm and intact distal pulses.   Murmur heard. bradycardic  Respiratory: Effort normal and breath sounds normal. No respiratory distress. He has no wheezes.  GI: Soft. There is tenderness. There is guarding.  There is moderate to severe tenderness in the epigastrium and right upper quadrant  Lymphadenopathy:    He has no cervical adenopathy.  Neurological: He is alert and oriented to person, place, and time.  Skin: Skin is warm and dry. No rash noted. He is not diaphoretic. No erythema.  Psychiatric: His behavior is normal. Judgment normal.     Assessment/Plan Acute cholecystitis with cholelithiasis  Plan will be to admit him to the hospital and start him on IV antibiotics. We will review all of the x-ray  data from [place]. He has had CAT scans in our system in the past which have demonstrated gallstone. Will plan on eventual laparoscopic cholecystectomy with possible cholangiogram. I discussed this with the patient and his wife in detail. I discussed the risks of surgery as well. Surgery will be scheduled per our [place] service.  Billie Trager A 05/02/2014, 6:26 AM

## 2014-05-02 NOTE — ED Notes (Signed)
Patient arrived via POV with abdominal pain and nausea. Patient was just seen at [facility] with the same and was told he had sludge in his gallbladder but did not require surgery. Patient had not had any oral intake for several days and tonight he ate and his abdominal pain started.

## 2014-05-03 ENCOUNTER — Encounter (HOSPITAL_COMMUNITY): Payer: Self-pay | Admitting: Surgery

## 2014-05-03 LAB — COMPREHENSIVE METABOLIC PANEL
ALT: 97 U/L — ABNORMAL HIGH (ref 0–53)
AST: 69 U/L — ABNORMAL HIGH (ref 0–37)
Albumin: 3.1 g/dL — ABNORMAL LOW (ref 3.5–5.2)
Alkaline Phosphatase: 66 U/L (ref 39–117)
Anion gap: 11 (ref 5–15)
BUN: 11 mg/dL (ref 6–23)
CO2: 27 mEq/L (ref 19–32)
Calcium: 8.5 mg/dL (ref 8.4–10.5)
Chloride: 101 mEq/L (ref 96–112)
Creatinine, Ser: 1.38 mg/dL — ABNORMAL HIGH (ref 0.50–1.35)
GFR calc Af Amer: 66 mL/min — ABNORMAL LOW (ref >90)
GFR calc non Af Amer: 57 mL/min — ABNORMAL LOW (ref >90)
Glucose, Bld: 145 mg/dL — ABNORMAL HIGH (ref 70–99)
Potassium: 4.5 mEq/L (ref 3.7–5.3)
Sodium: 139 mEq/L (ref 137–147)
Total Bilirubin: 1.2 mg/dL (ref 0.3–1.2)
Total Protein: 5.9 g/dL — ABNORMAL LOW (ref 6.0–8.3)

## 2014-05-03 LAB — CBC
HCT: 38.6 % — ABNORMAL LOW (ref 39.0–52.0)
Hemoglobin: 12.6 g/dL — ABNORMAL LOW (ref 13.0–17.0)
MCH: 27.5 pg (ref 26.0–34.0)
MCHC: 32.6 g/dL (ref 30.0–36.0)
MCV: 84.1 fL (ref 78.0–100.0)
Platelets: 169 10*3/uL (ref 150–400)
RBC: 4.59 MIL/uL (ref 4.22–5.81)
RDW: 12.8 % (ref 11.5–15.5)
WBC: 8.2 10*3/uL (ref 4.0–10.5)

## 2014-05-03 MED ORDER — HYDROCODONE-ACETAMINOPHEN 5-325 MG PO TABS: 1.0000 | ORAL_TABLET | ORAL | Status: DC | PRN

## 2014-05-03 NOTE — Progress Notes (Signed)
UR Completed.  Jorge Peterson [phone] 05/03/2014  

## 2014-05-03 NOTE — Progress Notes (Signed)
Patient ID: Jorge Peterson, male   DOB: [birth year], 53 y.o.   MRN: [id number]   LOS: 1 day   Subjective: Pain controlled, denies N/V.   Objective: Vital signs in last 24 hours: Temp:  [97.7 F (36.5 C)-99.6 F (37.6 C)] 97.9 F (36.6 C) (09/04 0544) Pulse Rate:  [62-68] 64 (09/04 0544) Resp:  [12-20] 16 (09/04 0544) BP: (107-151)/(49-96) 107/49 mmHg (09/04 0544) SpO2:  [90 %-100 %] 90 % (09/04 0544) Weight:  [227 lb (102.967 kg)] 227 lb (102.967 kg) (09/03 2020) Last BM Date: 05/01/14   Laboratory  CBC  Recent Labs  05/02/14 0218 05/03/14 0347  WBC 11.0* 8.2  HGB 14.8 12.6*  HCT 43.2 38.6*  PLT 195 169   BMET  Recent Labs  05/02/14 0218 05/03/14 0347  NA 140 139  K 4.1 4.5  CL 100 101  CO2 29 27  GLUCOSE 169* 145*  BUN 16 11  CREATININE 1.18 1.38*  CALCIUM 9.8 8.5    Physical Exam General appearance: alert and no distress Resp: clear to auscultation bilaterally Cardio: regular rate and rhythm GI: normal findings: bowel sounds normal and soft, incisions C/D/I   Assessment/Plan: Acute cholecystitis s/p lap chole -- D/C home    Lisette Abu, PA-C Pager: [phone] 05/03/2014

## 2014-05-03 NOTE — Progress Notes (Signed)
Doing ok.  No cardiac issues overnight.  Home today.

## 2014-05-03 NOTE — Discharge Summary (Signed)
Discharge

## 2014-05-03 NOTE — Progress Notes (Signed)
D/c to home via wheelchair. Patient had no complaints of nausea or vomiting and pain denied. Breathing regular and unlabored on room air.  D/c instruction given to patient and wife and demonstrated understanding.

## 2014-05-03 NOTE — Discharge Summary (Signed)
Physician Discharge Summary  Patient ID: EMRE STOCK MRN: [id number] DOB/AGE: Apr 22, 1961 53 y.o.  Admit date: 05/02/2014 Discharge date: 05/03/2014  Discharge Diagnoses Patient Active Problem List   Diagnosis Date Noted  . Acute cholecystitis 05/02/2014  . Hx of adenomatous colonic polyps 04/12/2013  . Solitary pulmonary nodule 02/09/2013  . H/O lymph node biopsy 01/24/2013  . Borderline diabetes 11/27/2012  . Chest pain, mid sternal 11/26/2012  . Abnormal CT of the chest   11/26/2012  . History of adenomatous polyp of colon 11/05/2011  . Sigmoid diverticulitis 10/23/2011  . Leukocytosis 10/23/2011  . HLD (hyperlipidemia) 10/23/2011  . HTN (hypertension) 10/23/2011  . Bradycardia 10/07/2010  . OTHER CHEST PAIN 10/07/2010  . Atrial fibrillation 12/03/2009    Consultants None   Procedures 9/3 -- Laparoscopic cholecystectomy with intraoperative cholangiogram by Dr. Christie Beckers   HPI: Jorge Peterson presented with a three-day history of epigastric abdominal pain hurting through to his back. He had the sudden onset of severe, sharp epigastric abdominal pain on Tuesday. He presented to [facility]. Per his report, he had a CAT scan of the abdomen and pelvis which showed a gallstone impacted in the gallbladder neck and inflammation. He was admitted to the hospital. He then had an ultrasound which apparently showed only sludge. This was followed by a HIDA scan which apparently was normal. He was discharged home the following day after having improved. Upon returning home, however, he redeveloped sharp abdominal pain and presented to [facility]. He was admitted by the general surgical service.   Hospital Course: The patient underwent laparoscopic cholecystectomy without complication. By the next day his pain was controlled on oral medication and he was tolerating a regular diet. He was discharged home in good condition.      Medication List         acetaminophen 500 MG tablet  Commonly known as:  TYLENOL  Take 1,000 mg by mouth 3 (three) times daily as needed for pain.     aspirin EC 81 MG tablet  Take 81 mg by mouth daily.     Biotin 1000 MCG tablet  Take 1,000 mcg by mouth daily.     COQ10 150 MG Caps  Take 150 mg by mouth daily.     fish oil-omega-3 fatty acids 1000 MG capsule  Take 1 g by mouth daily.     fluticasone 50 MCG/ACT nasal spray  Commonly known as:  FLONASE  Place 1 spray into the nose daily as needed for allergies.     folic acid 664 MCG tablet  Commonly known as:  FOLVITE  Take 400 mcg by mouth daily.     hydrochlorothiazide 12.5 MG capsule  Commonly known as:  MICROZIDE  Take 12.5 mg by mouth daily.     HYDROcodone-acetaminophen 5-325 MG per tablet  Commonly known as:  NORCO/VICODIN  Take 1-2 tablets by mouth every 4 (four) hours as needed for moderate pain.     losartan 100 MG tablet  Commonly known as:  COZAAR  Take 100 mg by mouth daily.     multivitamin tablet  Take 1 tablet by mouth daily.     omeprazole 40 MG capsule  Commonly known as:  PRILOSEC  Take 40 mg by mouth daily.     PROBIOTIC FORMULA PO  Take 1 capsule by mouth daily.     simvastatin 40 MG tablet  Commonly known as:  ZOCOR  Take 40 mg by mouth at bedtime.  Follow-up Information   Follow up with [facility] On 05/28/2014.   Contact information:   [address] [place]   [place] [address] [phone]       Signed: Bryn Gulling Pager: [phone] 05/03/2014, 9:38 AM

## 2014-05-03 NOTE — Discharge Instructions (Signed)
[facility] ______[facility] °LAPAROSCOPIC SURGERY: POST OP INSTRUCTIONS °Always review your discharge instruction sheet given to you by the facility where your surgery was performed. °IF YOU HAVE DISABILITY OR FAMILY LEAVE FORMS, YOU MUST BRING THEM TO THE OFFICE FOR PROCESSING.   °DO NOT GIVE THEM TO YOUR DOCTOR. ° °1. A prescription for pain medication may be given to you upon discharge.  Take your pain medication as prescribed, if needed.  If narcotic pain medicine is not needed, then you may take acetaminophen (Tylenol) or ibuprofen (Advil) as needed. °2. Take your usually prescribed medications unless otherwise directed. °3. If you need a refill on your pain medication, please contact your pharmacy.  They will contact our office to request authorization. Prescriptions will not be filled after 5pm or on week-ends. °4. You should follow a light diet the first few days after arrival home, such as soup and crackers, etc.  Be sure to include lots of fluids daily. °5. Most patients will experience some swelling and bruising in the area of the incisions.  Ice packs will help.  Swelling and bruising can take several days to resolve.  °6. It is common to experience some constipation if taking pain medication after surgery.  Increasing fluid intake and taking a stool softener (such as Colace) will usually help or prevent this problem from occurring.  A mild laxative (Milk of Magnesia or Miralax) should be taken according to package instructions if there are no bowel movements after 48 hours. °7. Unless discharge instructions indicate otherwise, you may remove your bandages 24-48 hours after surgery, and you may shower at that time.  You may have steri-strips (small skin tapes) in place directly over the incision.  These strips should be left on the skin for 7-10 days.  If your surgeon used skin glue on the incision, you may shower in 24 hours.  The glue will flake off over the next 2-3 weeks.  Any sutures or  staples will be removed at the office during your follow-up visit. °8. ACTIVITIES:  You may resume regular (light) daily activities beginning the next day--such as daily self-care, walking, climbing stairs--gradually increasing activities as tolerated.  You may have sexual intercourse when it is comfortable.  Refrain from any heavy lifting or straining until approved by your doctor. °a. You may drive when you are no longer taking prescription pain medication, you can comfortably wear a seatbelt, and you can safely maneuver your car and apply brakes. °b. RETURN TO WORK:  __________________________________________________________ °9. You should see your doctor in the office for a follow-up appointment approximately 2-3 weeks after your surgery.  Make sure that you call for this appointment within a day or two after you arrive home to insure a convenient appointment time. °10. OTHER INSTRUCTIONS: __________________________________________________________________________________________________________________________ __________________________________________________________________________________________________________________________ °WHEN TO CALL YOUR DOCTOR: °1. Fever over 101.0 °2. Inability to urinate °3. Continued bleeding from incision. °4. Increased pain, redness, or drainage from the incision. °5. Increasing abdominal pain ° °The clinic staff is available to answer your questions during regular business hours.  Please don’t hesitate to call and ask to speak to one of the nurses for clinical concerns.  If you have a medical emergency, go to the nearest emergency room or call 911.  A surgeon from [facility] is always on call at the hospital. °[address], [address], [place], [place]  [address] ? [address] [address], [place], [place]   [address] °[phone] ? [phone] ? FAX [phone] °Web site:   [email or web address] °

## 2014-05-06 ENCOUNTER — Telehealth (INDEPENDENT_AMBULATORY_CARE_PROVIDER_SITE_OTHER): Payer: Self-pay | Admitting: Surgery

## 2014-05-06 NOTE — Telephone Encounter (Signed)
Jorge Peterson  09-13-1960 [id number]  Patient Care Team: [name], MD as PCP - General (Family Medicine)  This patient is a 53 y.o.male who calls today for surgical evaluation.   Date of procedure/visit: 05/02/2014  Surgery: Pre-operative Diagnosis: Calculus of gallbladder with acute cholecystitis, without mention of obstruction  Post-operative Diagnosis: Same  Surgeon: CORNETT,THOMAS A.    Reason for call: Persistent pain & bloating  Pt called concerned that he still has RUQ pain & bloating.  Eating OK.  Taking hydrocodone 2 q4h.  +flatus.  Finally have BM w MOM laxative.   Moving around well.  Wife with him, talking to him by phone as well.  I noted that POD#4 s/p emergent lap chole for cholecystitis = Sx will take weeks to fully resolve.  I rec'd just drinking liquids x1-2days.  Start Miralax BID to help counteract constipation from ##laxatives.  GasX OK to continue.  Max non- narcotic pain control with ice/heat & OTC NSAID as tolerated.  I updated the patient's status to the patient.  Recommendations were made.  Questions were answered.  If he worsens, call to be seen in office this week or go to ED if Sx markedly worsen.  The patient expressed understanding & appreciation.     Patient Active Problem List   Diagnosis Date Noted  . Acute cholecystitis 05/02/2014  . Hx of adenomatous colonic polyps 04/12/2013  . Solitary pulmonary nodule 02/09/2013  . H/O lymph node biopsy 01/24/2013  . Borderline diabetes 11/27/2012  . Chest pain, mid sternal 11/26/2012  . Abnormal CT of the chest   11/26/2012  . History of adenomatous polyp of colon 11/05/2011  . Sigmoid diverticulitis 10/23/2011  . Leukocytosis 10/23/2011  . HLD (hyperlipidemia) 10/23/2011  . HTN (hypertension) 10/23/2011  . Bradycardia 10/07/2010  . OTHER CHEST PAIN 10/07/2010  . Atrial fibrillation 12/03/2009    Past Medical History  Diagnosis Date  . Atrial fibrillation     s/p afib ablation 7/12  .  Hyperlipidemia   . Bradycardia     asymptomatic  . Diverticulitis   . Hypertension   . Shortness of breath   . GERD (gastroesophageal reflux disease)   . Headache(784.0)   . Family history of anesthesia complication     father gets pneumonia after "    Past Surgical History  Procedure Laterality Date  . Shoulder surgery      x 2  . Back surgery    . Atrial ablation surgery  03/24/11    PVI by JA  . Fistula surgery    . Tonsillectomy    . Bronchoscopy  01/23/2013     biopsy  . Video bronchoscopy with endobronchial ultrasound N/A 01/23/2013    Procedure: VIDEO BRONCHOSCOPY WITH ENDOBRONCHIAL ULTRASOUND;  Surgeon: Ivin Poot, MD;  Location: [place];  Service: Thoracic;  Laterality: N/A;  . Mediastinoscopy N/A 01/23/2013    Procedure: MEDIASTINOSCOPY;  Surgeon: Ivin Poot, MD;  Location: [facility];  Service: Thoracic;  Laterality: N/A;  . Cholecystectomy  05/02/2014    LAP CHOLE  . Cholecystectomy N/A 05/02/2014    Procedure: LAPAROSCOPIC CHOLECYSTECTOMY WITH INTRAOPERATIVE CHOLANGIOGRAM;  Surgeon: Erroll Luna, MD;  Location: [place];  Service: General;  Laterality: N/A;    History   Social History  . Marital Status: Married    Spouse Name: N/A    Number of Children: 3  . Years of Education: N/A   Occupational History  . Minister/ [employer]    Social History Main Topics  .  Smoking status: Never Smoker   . Smokeless tobacco: Never Used  . Alcohol Use: No  . Drug Use: No  . Sexual Activity: Yes   Other Topics Concern  . Not on file   Social History Narrative   Lives in [place].  3 children are all healthy.   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   No siblings with CAD.    Family History  Problem Relation Age of Onset  . Diabetes Father   . Heart disease Father     CABG at 87    Current Outpatient Prescriptions  Medication Sig Dispense Refill  . acetaminophen (TYLENOL) 500 MG tablet Take 1,000 mg by mouth 3 (three) times daily as  needed for pain.      [name] aspirin EC 81 MG tablet Take 81 mg by mouth daily.      . Biotin 1000 MCG tablet Take 1,000 mcg by mouth daily.      . Coenzyme Q10 (COQ10) 150 MG CAPS Take 150 mg by mouth daily.       . fish oil-omega-3 fatty acids 1000 MG capsule Take 1 g by mouth daily.       . fluticasone (FLONASE) 50 MCG/ACT nasal spray Place 1 spray into the nose daily as needed for allergies.       . folic acid (FOLVITE) 409 MCG tablet Take 400 mcg by mouth daily.        . hydrochlorothiazide (MICROZIDE) 12.5 MG capsule Take 12.5 mg by mouth daily.      [name] HYDROcodone-acetaminophen (NORCO/VICODIN) 5-325 MG per tablet Take 1-2 tablets by mouth every 4 (four) hours as needed for moderate pain.  30 tablet  0  . losartan (COZAAR) 100 MG tablet Take 100 mg by mouth daily.      . Multiple Vitamin (MULTIVITAMIN) tablet Take 1 tablet by mouth daily.       [name] omeprazole (PRILOSEC) 40 MG capsule Take 40 mg by mouth daily.       . Probiotic Product (PROBIOTIC FORMULA PO) Take 1 capsule by mouth daily.      . simvastatin (ZOCOR) 40 MG tablet Take 40 mg by mouth at bedtime.        No current facility-administered medications for this visit.     Allergies  Allergen Reactions  . Oxycodone-Acetaminophen Itching and Rash    @VS @  Dg Cholangiogram Operative  05/02/2014   CLINICAL DATA:  Gallbladder disease  EXAM: INTRAOPERATIVE CHOLANGIOGRAM  TECHNIQUE: Cholangiographic images from the C-arm fluoroscopic device were submitted for interpretation post-operatively. Please see the procedural report for the amount of contrast and the fluoroscopy time utilized.  COMPARISON:  None.  FINDINGS: Contrast fills the biliary tree and duodenum without filling defects in the common bile duct. There is extravasated contrast at the catheter insertion site.  IMPRESSION: Patent biliary tree without evidence of common bile duct stones.   Electronically Signed   By: Maryclare Bean M.D.   On: 05/02/2014 13:24    Note: This dictation  was prepared with Dragon/digital dictation along with [employer]. Any transcriptional errors that result from this process are unintentional.

## 2014-05-06 NOTE — ED Provider Notes (Signed)
Medical screening examination/treatment/procedure(s) were performed by non-physician practitioner and as supervising physician I was immediately available for consultation/collaboration.   EKG Interpretation None       Virgel Manifold, MD 05/06/14 0700

## 2014-05-09 ENCOUNTER — Encounter: Payer: Self-pay | Admitting: Gastroenterology

## 2014-05-09 ENCOUNTER — Ambulatory Visit (INDEPENDENT_AMBULATORY_CARE_PROVIDER_SITE_OTHER): Payer: 59 | Admitting: Gastroenterology

## 2014-05-09 VITALS — BP 132/80 | HR 92 | Ht 67.0 in | Wt 223.0 lb

## 2014-05-09 DIAGNOSIS — R140 Abdominal distension (gaseous): Secondary | ICD-10-CM | POA: Insufficient documentation

## 2014-05-09 DIAGNOSIS — R142 Eructation: Secondary | ICD-10-CM

## 2014-05-09 DIAGNOSIS — R141 Gas pain: Secondary | ICD-10-CM

## 2014-05-09 DIAGNOSIS — R1013 Epigastric pain: Secondary | ICD-10-CM

## 2014-05-09 DIAGNOSIS — R143 Flatulence: Secondary | ICD-10-CM

## 2014-05-09 MED ORDER — OMEPRAZOLE 40 MG PO CPDR: 40.0000 mg | DELAYED_RELEASE_CAPSULE | Freq: Every day | ORAL | Status: DC

## 2014-05-09 NOTE — Patient Instructions (Signed)
We have sent the following medications to your pharmacy for you to pick up at your convenience: Omeprazole 40 mg, please take one capsule by mouth once daily   Can purchase Gas-X over the counter and take as directed

## 2014-05-09 NOTE — Progress Notes (Addendum)
     05/09/2014 Jorge Peterson [id number] 1960/12/18   History of Present Illness:  This is a pleasant 53 year old male who is previously known to Dr. Hilarie Fredrickson for management of diverticulitis.  He is here today for some other issues, however.  Last week he developed 2 episodes in the same day of epigastric and chest pain that was evaluated at [facility] with CT or ultrasound and HIDA scan.  Patient was sent home from there, but returned to [facility] hospital when symptoms returned for the second time that day.  Records were reviewed in the emergency department and the surgeons were called to consult. He ended up having his gallbladder removed that same day, September 3.  Chronic cholecystitis and transmural defect were seen on pathology.  He is here today with complaints of bloating and upper abdominal discomfort.  He says that when he tries to eat his abdomen becomes very bloated and distended. He usually takes omeprazole 40 mg daily, but has been out of that for the last few days. He is also taking a daily probiotic and Gas-X as needed. He says that the last 2 days he has actually started to get better, however, he and his wife are somewhat frustrated and says that they did not really get good instructions on postop diet and expectations.  The did speak with the on-call surgeon at [facility] earlier this week who also told them that it can take weeks for him to heal and that the symptoms that he was having were not necessarily unexpected.   Current Medications, Allergies, Past Medical History, Past Surgical History, Family History and Social History were reviewed in [employer] record.   Physical Exam: BP 132/80  Pulse 92  Ht 5\' 7"  (1.702 m)  Wt 223 lb (101.152 kg)  BMI 34.92 kg/m2 General: Well developed white male in no acute distress Head: Normocephalic and atraumatic Eyes:  Sclerae anicteric, conjunctiva pink  Ears: Normal auditory acuity Lungs: Clear throughout to  auscultation Heart: Regular rate and rhythm Abdomen: Soft, non-distended.  Normal bowel sounds.  Laparoscopy incision sites noted along with ecchymoses on abdomen.  Expected and appropriate mild TTP in epigastrium and right abdomen. Musculoskeletal: Symmetrical with no gross deformities  Extremities: No edema  Neurological: Alert oriented x 4, grossly non-focal Psychological:  Alert and cooperative. Normal mood and affect  Assessment and Recommendations: -Complaints of abdominal bloating and epigastric abdominal discomfort in a 53 year old male who is s/p lap chole just one week ago.  Will treat expectantly for now; suspect that symptoms will improve/resolve with time and that symptoms are likely a consequence of recent surgery.  Advised to continue prilosec 40 mg daily, probiotic daily, and gas-x prn for now.  Advised to eat small meals and to avoid fatty/greasy foods for the next few weeks until symptoms improve.  If symptoms not resolved in a few weeks then patient to call back and would consider EGD.  Addendum: Reviewed and agree with initial management. Jerene Bears, MD

## 2014-05-24 ENCOUNTER — Encounter: Payer: Self-pay | Admitting: Gastroenterology

## 2014-05-24 ENCOUNTER — Emergency Department (HOSPITAL_COMMUNITY): Payer: 59

## 2014-05-24 ENCOUNTER — Inpatient Hospital Stay (HOSPITAL_COMMUNITY)
Admission: EM | Admit: 2014-05-24 | Discharge: 2014-05-26 | DRG: 446 | Disposition: A | Discharge status: Home / Self Care | Payer: 59 | Attending: Gastroenterology | Admitting: Gastroenterology

## 2014-05-24 ENCOUNTER — Ambulatory Visit (INDEPENDENT_AMBULATORY_CARE_PROVIDER_SITE_OTHER): Payer: 59 | Admitting: Gastroenterology

## 2014-05-24 ENCOUNTER — Telehealth: Payer: Self-pay | Admitting: Internal Medicine

## 2014-05-24 ENCOUNTER — Encounter (HOSPITAL_COMMUNITY): Payer: Self-pay | Admitting: Emergency Medicine

## 2014-05-24 VITALS — BP 144/82 | HR 60 | Ht 66.0 in | Wt 214.3 lb

## 2014-05-24 DIAGNOSIS — K219 Gastro-esophageal reflux disease without esophagitis: Secondary | ICD-10-CM | POA: Diagnosis present

## 2014-05-24 DIAGNOSIS — Z7982 Long term (current) use of aspirin: Secondary | ICD-10-CM

## 2014-05-24 DIAGNOSIS — R1013 Epigastric pain: Secondary | ICD-10-CM

## 2014-05-24 DIAGNOSIS — K831 Obstruction of bile duct: Secondary | ICD-10-CM | POA: Diagnosis present

## 2014-05-24 DIAGNOSIS — E785 Hyperlipidemia, unspecified: Secondary | ICD-10-CM | POA: Diagnosis present

## 2014-05-24 DIAGNOSIS — Z79899 Other long term (current) drug therapy: Secondary | ICD-10-CM

## 2014-05-24 DIAGNOSIS — K8050 Calculus of bile duct without cholangitis or cholecystitis without obstruction: Secondary | ICD-10-CM | POA: Diagnosis present

## 2014-05-24 DIAGNOSIS — R932 Abnormal findings on diagnostic imaging of liver and biliary tract: Secondary | ICD-10-CM

## 2014-05-24 DIAGNOSIS — K8071 Calculus of gallbladder and bile duct without cholecystitis with obstruction: Secondary | ICD-10-CM | POA: Diagnosis not present

## 2014-05-24 DIAGNOSIS — I10 Essential (primary) hypertension: Secondary | ICD-10-CM | POA: Diagnosis present

## 2014-05-24 DIAGNOSIS — I4891 Unspecified atrial fibrillation: Secondary | ICD-10-CM | POA: Diagnosis present

## 2014-05-24 DIAGNOSIS — K759 Inflammatory liver disease, unspecified: Secondary | ICD-10-CM

## 2014-05-24 LAB — COMPREHENSIVE METABOLIC PANEL
ALT: 1214 U/L — AB (ref 0–53)
AST: 867 U/L — AB (ref 0–37)
Albumin: 3.9 g/dL (ref 3.5–5.2)
Alkaline Phosphatase: 359 U/L — ABNORMAL HIGH (ref 39–117)
Anion gap: 14 (ref 5–15)
BUN: 17 mg/dL (ref 6–23)
CO2: 27 mEq/L (ref 19–32)
Calcium: 10.6 mg/dL — ABNORMAL HIGH (ref 8.4–10.5)
Chloride: 101 mEq/L (ref 96–112)
Creatinine, Ser: 1.17 mg/dL (ref 0.50–1.35)
GFR calc non Af Amer: 69 mL/min — ABNORMAL LOW (ref >90)
GFR, EST AFRICAN AMERICAN: 80 mL/min — AB (ref >90)
GLUCOSE: 157 mg/dL — AB (ref 70–99)
Potassium: 4.2 mEq/L (ref 3.7–5.3)
Sodium: 142 mEq/L (ref 137–147)
TOTAL PROTEIN: 7.7 g/dL (ref 6.0–8.3)
Total Bilirubin: 2.6 mg/dL — ABNORMAL HIGH (ref 0.3–1.2)

## 2014-05-24 LAB — URINALYSIS, ROUTINE W REFLEX MICROSCOPIC
Glucose, UA: NEGATIVE mg/dL
Ketones, ur: NEGATIVE mg/dL
Leukocytes, UA: NEGATIVE
Nitrite: NEGATIVE
PROTEIN: NEGATIVE mg/dL
SPECIFIC GRAVITY, URINE: 1.014 (ref 1.005–1.030)
UROBILINOGEN UA: 1 mg/dL (ref 0.0–1.0)
pH: 5.5 (ref 5.0–8.0)

## 2014-05-24 LAB — URINE MICROSCOPIC-ADD ON

## 2014-05-24 LAB — CBC WITH DIFFERENTIAL/PLATELET
BASOS ABS: 0 10*3/uL (ref 0.0–0.1)
Basophils Relative: 0 % (ref 0–1)
EOS ABS: 0.1 10*3/uL (ref 0.0–0.7)
EOS PCT: 2 % (ref 0–5)
HCT: 41.9 % (ref 39.0–52.0)
Hemoglobin: 13.8 g/dL (ref 13.0–17.0)
Lymphocytes Relative: 16 % (ref 12–46)
Lymphs Abs: 1.3 10*3/uL (ref 0.7–4.0)
MCH: 27 pg (ref 26.0–34.0)
MCHC: 32.9 g/dL (ref 30.0–36.0)
MCV: 81.8 fL (ref 78.0–100.0)
Monocytes Absolute: 0.8 10*3/uL (ref 0.1–1.0)
Monocytes Relative: 10 % (ref 3–12)
Neutro Abs: 5.9 10*3/uL (ref 1.7–7.7)
Neutrophils Relative %: 72 % (ref 43–77)
Platelets: 239 10*3/uL (ref 150–400)
RBC: 5.12 MIL/uL (ref 4.22–5.81)
RDW: 12.2 % (ref 11.5–15.5)
WBC: 8 10*3/uL (ref 4.0–10.5)

## 2014-05-24 LAB — PROTIME-INR
INR: 0.96 (ref 0.00–1.49)
Prothrombin Time: 12.8 seconds (ref 11.6–15.2)

## 2014-05-24 LAB — I-STAT TROPONIN, ED: TROPONIN I, POC: 0 ng/mL (ref 0.00–0.08)

## 2014-05-24 LAB — LIPASE, BLOOD: Lipase: 30 U/L (ref 11–59)

## 2014-05-24 MED ORDER — TRAZODONE HCL 50 MG PO TABS: 50.0000 mg | ORAL_TABLET | Freq: Every evening | ORAL | Status: DC | PRN

## 2014-05-24 MED ORDER — CIPROFLOXACIN IN D5W 400 MG/200ML IV SOLN
400.0000 mg | Freq: Two times a day (BID) | INTRAVENOUS | Status: DC
  Administered 2014-05-24 – 2014-05-25 (×2): 400 mg via INTRAVENOUS
  Filled 2014-05-24 (×3): qty 200

## 2014-05-24 MED ORDER — TRAZODONE HCL 50 MG PO TABS
50.0000 mg | ORAL_TABLET | Freq: Every evening | ORAL | Status: DC | PRN
  Filled 2014-05-24: qty 1

## 2014-05-24 MED ORDER — FLUTICASONE PROPIONATE 50 MCG/ACT NA SUSP
1.0000 | Freq: Every day | NASAL | Status: DC | PRN
  Filled 2014-05-24: qty 16

## 2014-05-24 MED ORDER — SIMETHICONE 80 MG PO CHEW
80.0000 mg | CHEWABLE_TABLET | Freq: Four times a day (QID) | ORAL | Status: DC | PRN
  Filled 2014-05-24: qty 1

## 2014-05-24 MED ORDER — ONDANSETRON HCL 4 MG/2ML IJ SOLN: 4.0000 mg | Freq: Four times a day (QID) | INTRAMUSCULAR | Status: DC | PRN

## 2014-05-24 MED ORDER — ONDANSETRON HCL 4 MG/2ML IJ SOLN
4.0000 mg | Freq: Once | INTRAMUSCULAR | Status: AC
  Administered 2014-05-24: 4 mg via INTRAVENOUS
  Filled 2014-05-24: qty 2

## 2014-05-24 MED ORDER — HYDROMORPHONE HCL 1 MG/ML IJ SOLN
1.0000 mg | Freq: Once | INTRAMUSCULAR | Status: AC
  Administered 2014-05-24: 1 mg via INTRAVENOUS
  Filled 2014-05-24: qty 1

## 2014-05-24 MED ORDER — SODIUM CHLORIDE 0.9 % IV SOLN
INTRAVENOUS | Status: DC
  Administered 2014-05-24 – 2014-05-25 (×2): via INTRAVENOUS

## 2014-05-24 MED ORDER — HYDROMORPHONE HCL 1 MG/ML IJ SOLN: 0.5000 mg | INTRAMUSCULAR | Status: DC | PRN

## 2014-05-24 MED ORDER — ONDANSETRON HCL 4 MG PO TABS: 4.0000 mg | ORAL_TABLET | Freq: Four times a day (QID) | ORAL | Status: DC | PRN

## 2014-05-24 MED ORDER — HYDROMORPHONE HCL 1 MG/ML IJ SOLN
1.0000 mg | INTRAMUSCULAR | Status: DC | PRN
  Administered 2014-05-24 – 2014-05-25 (×4): 1 mg via INTRAVENOUS
  Filled 2014-05-24 (×4): qty 1

## 2014-05-24 MED ORDER — ONDANSETRON HCL 4 MG/2ML IJ SOLN
4.0000 mg | Freq: Four times a day (QID) | INTRAMUSCULAR | Status: DC | PRN
  Administered 2014-05-25: 4 mg via INTRAVENOUS
  Filled 2014-05-24: qty 2

## 2014-05-24 NOTE — H&P (Signed)
Admission Note  Primary Care Physician:  [name], MD Primary Gastroenterologist:   Zenovia Jarred,  MD  HPI: Jorge Peterson is a 53 y.o. male known to [place] for a history of adenomatous colon polyps and diverticulosis with diverticulitis. Patient underwent a laparoscopic cholecystectomy on the 3rd of this month for symptomatic cholelithiasis. Since surgery patient has had ongoing episodes of upper abdominal pain. He was seen in our office 05/09/14 for the pain, as well as bloating. Symptoms felt to be consequence of recent surgery that would resolve with time. He has lost 15 pounds, continues to feel bloated. Last evening he had the "worst pain of my life". Pain led to vomiting. Patient was seen in the office today and subsequently referred to ED. Labs today are pertinent for markedly abnormal LFTs. WBC normal. CTscan suggests retained distal CBD stone. No ductal dilation. No evidence for pancreatitis. Lipase is 30.   Patient No other GI or general medical complaints. Patient has a history of atrial fibrillation, s/p ablation. EKG in ED reveals sinus brady. HR in 60's.    Past Medical History  Diagnosis Date  . Atrial fibrillation     s/p afib ablation 7/12  . Hyperlipidemia   . Bradycardia     asymptomatic  . Diverticulitis   . Hypertension   . GERD (gastroesophageal reflux disease)   . Headache(784.0)   . Family history of anesthesia complication     father gets pneumonia after "    Past Surgical History  Procedure Laterality Date  . Shoulder surgery      x 2  . Back surgery    . Atrial ablation surgery  03/24/11    PVI by JA  . Fistula surgery    . Tonsillectomy    . Bronchoscopy  01/23/2013     biopsy  . Video bronchoscopy with endobronchial ultrasound N/A 01/23/2013    Procedure: VIDEO BRONCHOSCOPY WITH ENDOBRONCHIAL ULTRASOUND;  Surgeon: Ivin Poot, MD;  Location: [facility];  Service: Thoracic;  Laterality: N/A;  . Mediastinoscopy N/A 01/23/2013    Procedure:  MEDIASTINOSCOPY;  Surgeon: Ivin Poot, MD;  Location: [facility];  Service: Thoracic;  Laterality: N/A;  . Cholecystectomy  05/02/2014    LAP CHOLE  . Cholecystectomy N/A 05/02/2014    Procedure: LAPAROSCOPIC CHOLECYSTECTOMY WITH INTRAOPERATIVE CHOLANGIOGRAM;  Surgeon: Erroll Luna, MD;  Location: [facility];  Service: General;  Laterality: N/A;    Prior to Admission medications   Medication Sig Start Date End Date Taking? Authorizing Provider  aspirin EC 81 MG tablet Take 81 mg by mouth daily.   Yes Historical Provider, MD  Biotin 1000 MCG tablet Take 1,000 mcg by mouth daily.   Yes Historical Provider, MD  Ca Carbonate-Mag Hydroxide (ROLAIDS PO) Take by mouth as needed (indigestion or reflux.).    Yes Historical Provider, MD  Coenzyme Q10 (COQ10) 150 MG CAPS Take 150 mg by mouth daily.    Yes Historical Provider, MD  fish oil-omega-3 fatty acids 1000 MG capsule Take 1 g by mouth daily.    Yes Historical Provider, MD  fluticasone (FLONASE) 50 MCG/ACT nasal spray Place 1 spray into the nose daily as needed for allergies.    Yes Historical Provider, MD  folic acid (FOLVITE) 423 MCG tablet Take 400 mcg by mouth daily.     Yes Historical Provider, MD  hydrochlorothiazide (MICROZIDE) 12.5 MG capsule Take 12.5 mg by mouth daily.   Yes Historical Provider, MD  losartan (COZAAR) 100 MG tablet Take 100 mg  by mouth daily.   Yes Historical Provider, MD  Multiple Vitamin (MULTIVITAMIN) tablet Take 1 tablet by mouth daily.    Yes Historical Provider, MD  omeprazole (PRILOSEC) 40 MG capsule Take 1 capsule (40 mg total) by mouth daily. 05/09/14  Yes Jessica D. Zehr, PA-C  Probiotic Product (PROBIOTIC FORMULA PO) Take 1 capsule by mouth daily.   Yes Historical Provider, MD  Simethicone (GAS-X PO) Take by mouth as needed (gas.).    Yes Historical Provider, MD  simvastatin (ZOCOR) 40 MG tablet Take 40 mg by mouth at bedtime.    Yes Historical Provider, MD  traZODone (DESYREL) 50 MG tablet Take 50 mg by mouth at  bedtime as needed for sleep.   Yes Historical Provider, MD    Current Facility-Administered Medications  Medication Dose Route Frequency Provider Last Rate Last Dose  . HYDROmorphone (DILAUDID) injection 0.5 mg  0.5 mg Intravenous Q2H PRN Shaune Pollack, MD      . HYDROmorphone (DILAUDID) injection 1 mg  1 mg Intravenous Once Shaune Pollack, MD       Current Outpatient Prescriptions  Medication Sig Dispense Refill  . aspirin EC 81 MG tablet Take 81 mg by mouth daily.      . Biotin 1000 MCG tablet Take 1,000 mcg by mouth daily.      . Ca Carbonate-Mag Hydroxide (ROLAIDS PO) Take by mouth as needed (indigestion or reflux.).       [name] Coenzyme Q10 (COQ10) 150 MG CAPS Take 150 mg by mouth daily.       . fish oil-omega-3 fatty acids 1000 MG capsule Take 1 g by mouth daily.       . fluticasone (FLONASE) 50 MCG/ACT nasal spray Place 1 spray into the nose daily as needed for allergies.       . folic acid (FOLVITE) 935 MCG tablet Take 400 mcg by mouth daily.        . hydrochlorothiazide (MICROZIDE) 12.5 MG capsule Take 12.5 mg by mouth daily.      [name] losartan (COZAAR) 100 MG tablet Take 100 mg by mouth daily.      . Multiple Vitamin (MULTIVITAMIN) tablet Take 1 tablet by mouth daily.       [name] omeprazole (PRILOSEC) 40 MG capsule Take 1 capsule (40 mg total) by mouth daily.  90 capsule  2  . Probiotic Product (PROBIOTIC FORMULA PO) Take 1 capsule by mouth daily.      . Simethicone (GAS-X PO) Take by mouth as needed (gas.).       [name] simvastatin (ZOCOR) 40 MG tablet Take 40 mg by mouth at bedtime.       . traZODone (DESYREL) 50 MG tablet Take 50 mg by mouth at bedtime as needed for sleep.        Allergies as of 05/24/2014 - Review Complete 05/24/2014  Allergen Reaction Noted  . Oxycodone-acetaminophen Itching and Rash     Family History  Problem Relation Age of Onset  . Diabetes Father   . Heart disease Father     CABG at 79    History   Social History  . Marital Status: Married    Spouse  Name: N/A    Number of Children: 3  . Years of Education: N/A   Occupational History  . Minister/ [employer]    Social History Main Topics  . Smoking status: Never Smoker   . Smokeless tobacco: Never Used  . Alcohol Use: No  . Drug Use: No  .  Sexual Activity: Yes   Other Topics Concern  . Not on file   Social History Narrative   Lives in [place].  3 children are all healthy.   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   No siblings with CAD.    Review of Systems:  All systems reviewed an negative except where noted in HPI.  Physical Exam: Vital signs in last 24 hours: Temp:  [98.1 F (36.7 C)] 98.1 F (36.7 C) (09/25 1148) Pulse Rate:  [60-71] 65 (09/25 1328) Resp:  [14-16] 16 (09/25 1328) BP: (111-155)/(65-92) 111/65 mmHg (09/25 1328) SpO2:  [92 %-97 %] 92 % (09/25 1328) Weight:  [214 lb 4.8 oz (97.206 kg)] 214 lb 4.8 oz (97.206 kg) (09/25 1027)   General:  Pleasant, well-developed, white male who appears physically uncomfortable head:  Normocephalic and atraumatic. Eyes:  Sclera clear, no obvious icterus.   Conjunctiva pink. Ears:  Normal auditory acuity. Neck:  Supple; no masses.  Lungs:  Clear throughout to auscultation.   No wheezes, crackles, or rhonchi. No acute distress. Heart:  Slightly bradycardic in high 60's. Occas extra beat. Abdomen:  Soft, nondistended, moderate RUQ tenderness. No masses, hepatomegaly. No obvious masses.  Normal bowel .    Msk:  Symmetrical without gross deformities.. Pulses:  Normal pulses noted. Extremities:  Without edema. Neurologic:  Alert and  oriented x4;  grossly normal neurologically. Skin:  Intact without significant lesions or rashes. Cervical Nodes:  No significant cervical adenopathy. Psych:  Alert and cooperative. Normal mood and affect.  Lab Results:  Recent Labs  05/24/14 1155  WBC 8.0  HGB 13.8  HCT 41.9  PLT 239   BMET  Recent Labs  05/24/14 1155  NA 142  K 4.2  CL 101  CO2 27    GLUCOSE 157*  BUN 17  CREATININE 1.17  CALCIUM 10.6*   LFT  Recent Labs  05/24/14 1155  PROT 7.7  ALBUMIN 3.9  AST 867*  ALT 1214*  ALKPHOS 359*  BILITOT 2.6*   PT/INR  Recent Labs  05/24/14 1155  LABPROT 12.8  INR 0.96    Studies/Results: Ct Abdomen Pelvis Wo Contrast  05/24/2014   CLINICAL DATA:  Intermittent upper abdominal pain radiating into the back for the past 3 weeks becoming constant yesterday; history of cholecystectomy 3 weeks ago.  EXAM: CT ABDOMEN AND PELVIS WITHOUT CONTRAST  TECHNIQUE: Multidetector CT imaging of the abdomen and pelvis was performed following the standard protocol without IV contrast.  COMPARISON:  CT scan of the abdomen pelvis of December 21, 2011.  FINDINGS: There is mildly increased density within the fat at the gallbladder bed consistent with postsurgical change. There is no free fluid or free air nor evidence of an abnormal fluid collection. The liver exhibits no focal mass nor ductal dilation. There is a 2 mm diameter calcification which lies in the region of the ampulla of Vater on image 40 of series 2 that is worrisome for a retained common bile duct stone. There is no common bile ductal dilation.  The pancreas, spleen, partially distended stomach, adrenal glands, and kidneys are normal. The small and large bowel exhibit no evidence of ileus nor obstruction. The urinary bladder, prostate gland, and seminal vesicles are normal. There is no inguinal nor significant umbilical hernia. There is no free fluid in the abdomen or pelvis. There is no evidence of an abdominal wall abscess or hematoma.  The lumbar spine and bony pelvis exhibit no acute abnormalities. There are degenerative disc changes  at L5-S1. The lung bases exhibit minimal compressive atelectasis.  IMPRESSION: 1. The findings are worrisome for a retained 2 mm diameter calcified common bile duct stone. It appears to lie at the level of the ampulla of Vater. No common bile ductal dilation is  demonstrated. There is no evidence of acute pancreatitis. 2. There is no acute abnormality within the gallbladder bed. Minimal postsurgical changes are present. 3. There is no acute abnormality elsewhere within the abdomen or pelvis. 4. These results were called by telephone at the time of interpretation on 05/24/2014 at 12:32 pm to [facility], who verbally acknowledged these results and will give them to Dr.Ray .   Electronically Signed   By: David  Martinique   On: 05/24/2014 12:37   Dg Chest 2 View  05/24/2014   CLINICAL DATA:  Short of breath.  Pain.  EXAM: CHEST  2 VIEW  COMPARISON:  04/30/2014.  FINDINGS: There are low lung volumes. Lungs are clear. No pleural effusion or pneumothorax.  Heart, mediastinum hila are unremarkable.  Bony thorax is intact.  IMPRESSION: No active cardiopulmonary disease.   Electronically Signed   By: Lajean Manes M.D.   On: 05/24/2014 12:23    Impression / Plan:   14. 53 year old male with probable choledocholithiasis based on labs, CTscan and HPI. Will admit for further evaluation and treatment. We discussed ERCP including alternatives (not much really) and potential risks of procedure. Patient agrees to proceed. ERCP will be done tomorrow am at [facility] with general anesthesia. Will start IV cipro given biliary obstruction. Continue supportive care. We may get patient transferred to [facility] today so we don't have to arrange for am transportation to [facility].   2. Atrial fibrillation, s/p ablation. Admit to telemetry bed. Currently in sinus brady.   3. HTN / hyperlipidemia. Will evaluate blood pressure, may need to hold Cozaar if narcotics drop pressure.     LOS: 0 days   Tye Savoy  05/24/2014, 2:37 PM    ________________________________________________________________________  Velora Heckler GI MD note:  I personally examined the patient, reviewed the data and agree with the assessment and plan described above.  Elevated liver tests, acute pain, very likely stone  in distal duct on CT.  Planning to admit, IV abx, pain control, ERCP tomorrow.   Owens Loffler, MD [facility] Gastroenterology Pager [phone]

## 2014-05-24 NOTE — Patient Instructions (Signed)
We are sending you directly to the ED department at [facility]  They have been informed that you are on the way

## 2014-05-24 NOTE — Telephone Encounter (Signed)
Patient crying. Wife speaking in the background. He is nauseated. Has not eaten. Has taken OTC gas-x and his regular medications but does not feel any better. He has abdominal pain that started getting worse yesterday.States it hurts to sit. He did not sleep well last night due to his pain. He is afebrile. Not actively vomiting. No BM changes. S/p laparoscopic cholecystectomy earlier this month. Declines to go to the emergency room. Has not contacted his surgeon or PCP. Agrees to work in appointment here today. Agrees to arrive at 10:40 am.

## 2014-05-24 NOTE — ED Provider Notes (Signed)
CSN: [id number]     Arrival date & time 05/24/14  1129 History   First MD Initiated Contact with Patient 05/24/14 1146     Chief Complaint  Patient presents with  . Abdominal Pain  . Post-op Problem     (Consider location/radiation/quality/duration/timing/severity/associated sxs/prior Treatment) HPI 53 y.o. Male complaining of epigastric pain s/p cholecystectomy 2 weeks ago with intermittent pain since now with 24 hours of worsening pain with radiation to left flank.  Patient with associated nausea and vomiting, no fever or chills.  Patient states severe pain makes him nauseated and feel dyspneic.  He was sent here from Dr. Kelby Fam office due to severe pain.  Past Medical History  Diagnosis Date  . Atrial fibrillation     s/p afib ablation 7/12  . Hyperlipidemia   . Bradycardia     asymptomatic  . Diverticulitis   . Hypertension   . Shortness of breath   . GERD (gastroesophageal reflux disease)   . Headache(784.0)   . Family history of anesthesia complication     father gets pneumonia after "   Past Surgical History  Procedure Laterality Date  . Shoulder surgery      x 2  . Back surgery    . Atrial ablation surgery  03/24/11    PVI by JA  . Fistula surgery    . Tonsillectomy    . Bronchoscopy  01/23/2013     biopsy  . Video bronchoscopy with endobronchial ultrasound N/A 01/23/2013    Procedure: VIDEO BRONCHOSCOPY WITH ENDOBRONCHIAL ULTRASOUND;  Surgeon: Ivin Poot, MD;  Location: [place];  Service: Thoracic;  Laterality: N/A;  . Mediastinoscopy N/A 01/23/2013    Procedure: MEDIASTINOSCOPY;  Surgeon: Ivin Poot, MD;  Location: [place];  Service: Thoracic;  Laterality: N/A;  . Cholecystectomy  05/02/2014    LAP CHOLE  . Cholecystectomy N/A 05/02/2014    Procedure: LAPAROSCOPIC CHOLECYSTECTOMY WITH INTRAOPERATIVE CHOLANGIOGRAM;  Surgeon: Erroll Luna, MD;  Location: [place] [facility];  Service: General;  Laterality: N/A;   Family History  Problem Relation Age of Onset  .  Diabetes Father   . Heart disease Father     CABG at 76   History  Substance Use Topics  . Smoking status: Never Smoker   . Smokeless tobacco: Never Used  . Alcohol Use: No    Review of Systems  All other systems reviewed and are negative.     Allergies  Oxycodone-acetaminophen  Home Medications   Prior to Admission medications   Medication Sig Start Date End Date Taking? Authorizing Provider  aspirin EC 81 MG tablet Take 81 mg by mouth daily.   Yes Historical Provider, MD  Biotin 1000 MCG tablet Take 1,000 mcg by mouth daily.   Yes Historical Provider, MD  Ca Carbonate-Mag Hydroxide (ROLAIDS PO) Take by mouth as needed (indigestion or reflux.).    Yes Historical Provider, MD  Coenzyme Q10 (COQ10) 150 MG CAPS Take 150 mg by mouth daily.    Yes Historical Provider, MD  fish oil-omega-3 fatty acids 1000 MG capsule Take 1 g by mouth daily.    Yes Historical Provider, MD  fluticasone (FLONASE) 50 MCG/ACT nasal spray Place 1 spray into the nose daily as needed for allergies.    Yes Historical Provider, MD  folic acid (FOLVITE) 518 MCG tablet Take 400 mcg by mouth daily.     Yes Historical Provider, MD  hydrochlorothiazide (MICROZIDE) 12.5 MG capsule Take 12.5 mg by mouth daily.   Yes Historical Provider, MD  losartan (COZAAR) 100 MG tablet Take 100 mg by mouth daily.   Yes Historical Provider, MD  Multiple Vitamin (MULTIVITAMIN) tablet Take 1 tablet by mouth daily.    Yes Historical Provider, MD  omeprazole (PRILOSEC) 40 MG capsule Take 1 capsule (40 mg total) by mouth daily. 05/09/14  Yes Jessica D. Zehr, PA-C  Probiotic Product (PROBIOTIC FORMULA PO) Take 1 capsule by mouth daily.   Yes Historical Provider, MD  Simethicone (GAS-X PO) Take by mouth as needed (gas.).    Yes Historical Provider, MD  simvastatin (ZOCOR) 40 MG tablet Take 40 mg by mouth at bedtime.    Yes Historical Provider, MD  traZODone (DESYREL) 50 MG tablet Take 50 mg by mouth at bedtime as needed for sleep.   Yes  Historical Provider, MD   BP 112/72  Pulse 71  Temp(Src) 98.1 F (36.7 C) (Oral)  Resp 16  SpO2 94% Physical Exam  Nursing note and vitals reviewed. Constitutional: He is oriented to person, place, and time. He appears well-developed and well-nourished. He appears distressed.  HENT:  Head: Normocephalic and atraumatic.  Right Ear: External ear normal.  Left Ear: External ear normal.  Nose: Nose normal.  Mouth/Throat: Oropharynx is clear and moist.  Eyes: Conjunctivae and EOM are normal. Pupils are equal, round, and reactive to light.  Neck: Normal range of motion. Neck supple.  Cardiovascular: Normal rate, regular rhythm, normal heart sounds and intact distal pulses.   Pulmonary/Chest: Effort normal and breath sounds normal.  Abdominal: Soft. He exhibits no mass. There is tenderness. There is no rebound and no guarding.  Musculoskeletal: Normal range of motion.  Neurological: He is alert and oriented to person, place, and time. He has normal reflexes.  Skin: Skin is warm and dry.  Psychiatric: He has a normal mood and affect. His behavior is normal. Judgment and thought content normal.    ED Course  Procedures (including critical care time) Labs Review Labs Reviewed  COMPREHENSIVE METABOLIC PANEL - Abnormal; Notable for the following:    Glucose, Bld 157 (*)    Calcium 10.6 (*)    AST 867 (*)    ALT 1214 (*)    Alkaline Phosphatase 359 (*)    Total Bilirubin 2.6 (*)    GFR calc non Af Amer 69 (*)    GFR calc Af Amer 80 (*)    All other components within normal limits  CBC WITH DIFFERENTIAL  LIPASE, BLOOD  PROTIME-INR  URINALYSIS, ROUTINE W REFLEX MICROSCOPIC  I-STAT TROPOININ, ED    Imaging Review Ct Abdomen Pelvis Wo Contrast  05/24/2014   CLINICAL DATA:  Intermittent upper abdominal pain radiating into the back for the past 3 weeks becoming constant yesterday; history of cholecystectomy 3 weeks ago.  EXAM: CT ABDOMEN AND PELVIS WITHOUT CONTRAST  TECHNIQUE:  Multidetector CT imaging of the abdomen and pelvis was performed following the standard protocol without IV contrast.  COMPARISON:  CT scan of the abdomen pelvis of December 21, 2011.  FINDINGS: There is mildly increased density within the fat at the gallbladder bed consistent with postsurgical change. There is no free fluid or free air nor evidence of an abnormal fluid collection. The liver exhibits no focal mass nor ductal dilation. There is a 2 mm diameter calcification which lies in the region of the ampulla of Vater on image 40 of series 2 that is worrisome for a retained common bile duct stone. There is no common bile ductal dilation.  The pancreas, spleen, partially distended stomach,  adrenal glands, and kidneys are normal. The small and large bowel exhibit no evidence of ileus nor obstruction. The urinary bladder, prostate gland, and seminal vesicles are normal. There is no inguinal nor significant umbilical hernia. There is no free fluid in the abdomen or pelvis. There is no evidence of an abdominal wall abscess or hematoma.  The lumbar spine and bony pelvis exhibit no acute abnormalities. There are degenerative disc changes at L5-S1. The lung bases exhibit minimal compressive atelectasis.  IMPRESSION: 1. The findings are worrisome for a retained 2 mm diameter calcified common bile duct stone. It appears to lie at the level of the ampulla of Vater. No common bile ductal dilation is demonstrated. There is no evidence of acute pancreatitis. 2. There is no acute abnormality within the gallbladder bed. Minimal postsurgical changes are present. 3. There is no acute abnormality elsewhere within the abdomen or pelvis. 4. These results were called by telephone at the time of interpretation on 05/24/2014 at 12:32 pm to [facility], who verbally acknowledged these results and will give them to Dr.Lauralyn Shadowens .   Electronically Signed   By: David  Martinique   On: 05/24/2014 12:37   Dg Chest 2 View  05/24/2014   CLINICAL DATA:   Short of breath.  Pain.  EXAM: CHEST  2 VIEW  COMPARISON:  04/30/2014.  FINDINGS: There are low lung volumes. Lungs are clear. No pleural effusion or pneumothorax.  Heart, mediastinum hila are unremarkable.  Bony thorax is intact.  IMPRESSION: No active cardiopulmonary disease.   Electronically Signed   By: Lajean Manes M.D.   On: 05/24/2014 12:23     EKG Interpretation   Date/Time:  Friday May 24 2014 11:43:05 EDT Ventricular Rate:  51 PR Interval:  214 QRS Duration: 102 QT Interval:  429 QTC Calculation: 395 R Axis:   -4 Text Interpretation:  Sinus bradycardia Otherwise normal ECG Confirmed by  Fitzpatrick Alberico MD, Andee Poles ([id number]) on 05/24/2014 1:26:34 PM      MDM   Final diagnoses:  Abdominal pain, epigastric  Common bile duct (CBD) obstruction  Hepatitis   53 y.o. Male/sp cholecystectomy presents today with  cbd stone and post obstructive hepatitis.  Patient with pain well controlled here with iv dilaudid.  GI paged.   Patient care discussed with Nevin Bloodgood on for GI.  Patient seen by GI and they plan to admit for ercp tomorrow.  Patient to be transferred to [facility] as or closed here.   Shaune Pollack, MD 05/24/14 [phone]

## 2014-05-24 NOTE — ED Notes (Signed)
Patient aware that a urine sample is needed, urinal is at the bedside. 

## 2014-05-24 NOTE — ED Notes (Addendum)
Pt c/o intermittent upper abdominal pain radiating into back and intermittent n/v x 3 weeks.  Sts pain became constant yesterday.  Pain score 10/10.  Pt had gallbladder removed x 3 weeks ago.

## 2014-05-24 NOTE — Assessment & Plan Note (Signed)
3 week history of recurrent, now incapacitating abdominal pain following laparoscopic cholecystectomy.  Patient did have similar pain preoperatively though much less severe.  Diagnostic considerations include sphincter of OD spasm, bile peritonitis from a biliary tract leak, or possibly acute pancreatitis.  Recommendations #1 patient will be sent to the emergency room for further evaluation including CT scan, lab work and anticipated admission

## 2014-05-24 NOTE — Progress Notes (Signed)
_                                                                                                                History of Present Illness:  Jorge Peterson is a 53 year old white male seen as an emergency work in for evaluation of abdominal pain.  He status post laparoscopic cholecystectomy about 3 weeks ago.  No stones were seen but pathology demonstrated chronic cholecystitis.  Intraoperative cholangiogram was normal.  Since surgery he's had a couple of episodes of moderately intense upper abdominal pain lasting hours.  This was associated with bloating.  Symptoms resolved spontaneously.  Over the past 2 days he has had worsening abdominal pain which, at this point, it is intolerable.  He has nausea from the pain but has had no vomiting.  He is without fever.  Patient states that the pain is reminiscent of the pain prior to cholecystectomy although much more severe.   Past Medical History  Diagnosis Date  . Atrial fibrillation     s/p afib ablation 7/12  . Hyperlipidemia   . Bradycardia     asymptomatic  . Diverticulitis   . Hypertension   . Shortness of breath   . GERD (gastroesophageal reflux disease)   . Headache(784.0)   . Family history of anesthesia complication     father gets pneumonia after "   Past Surgical History  Procedure Laterality Date  . Shoulder surgery      x 2  . Back surgery    . Atrial ablation surgery  03/24/11    PVI by JA  . Fistula surgery    . Tonsillectomy    . Bronchoscopy  01/23/2013     biopsy  . Video bronchoscopy with endobronchial ultrasound N/A 01/23/2013    Procedure: VIDEO BRONCHOSCOPY WITH ENDOBRONCHIAL ULTRASOUND;  Surgeon: Ivin Poot, MD;  Location: [place];  Service: Thoracic;  Laterality: N/A;  . Mediastinoscopy N/A 01/23/2013    Procedure: MEDIASTINOSCOPY;  Surgeon: Ivin Poot, MD;  Location: [place];  Service: Thoracic;  Laterality: N/A;  . Cholecystectomy  05/02/2014    LAP CHOLE  . Cholecystectomy N/A 05/02/2014    Procedure: LAPAROSCOPIC CHOLECYSTECTOMY WITH INTRAOPERATIVE CHOLANGIOGRAM;  Surgeon: Erroll Luna, MD;  Location: [place];  Service: General;  Laterality: N/A;   family history includes Diabetes in his father; Heart disease in his father. Current Outpatient Prescriptions  Medication Sig Dispense Refill  . aspirin EC 81 MG tablet Take 81 mg by mouth daily.      . Biotin 1000 MCG tablet Take 1,000 mcg by mouth daily.      . Ca Carbonate-Mag Hydroxide (ROLAIDS PO) Take by mouth.      . Coenzyme Q10 (COQ10) 150 MG CAPS Take 150 mg by mouth daily.       . fish oil-omega-3 fatty acids 1000 MG capsule Take 1 g by mouth daily.       . fluticasone (FLONASE) 50 MCG/ACT nasal spray Place 1 spray into the nose daily as needed  for allergies.       . folic acid (FOLVITE) 630 MCG tablet Take 400 mcg by mouth daily.        . hydrochlorothiazide (MICROZIDE) 12.5 MG capsule Take 12.5 mg by mouth daily.      [name] losartan (COZAAR) 100 MG tablet Take 100 mg by mouth daily.      . Multiple Vitamin (MULTIVITAMIN) tablet Take 1 tablet by mouth daily.       [name] omeprazole (PRILOSEC) 40 MG capsule Take 1 capsule (40 mg total) by mouth daily.  90 capsule  2  . Probiotic Product (PROBIOTIC FORMULA PO) Take 1 capsule by mouth daily.      . Simethicone (GAS-X PO) Take by mouth.      . simvastatin (ZOCOR) 40 MG tablet Take 40 mg by mouth at bedtime.        No current facility-administered medications for this visit.   Allergies as of 05/24/2014 - Review Complete 05/24/2014  Allergen Reaction Noted  . Oxycodone-acetaminophen Itching and Rash     reports that he has never smoked. He has never used smokeless tobacco. He reports that he does not drink alcohol or use illicit drugs.   Review of Systems: Pertinent positive and negative review of systems were noted in the above HPI section. All other review of systems were otherwise negative.  Vital signs were reviewed in today's medical record Physical Exam: General: Well  developed , well nourished in acute distress secondary to abdominal pain Skin: anicteric Head: Normocephalic and atraumatic Eyes:  sclerae anicteric, EOMI Ears: Normal auditory acuity Mouth: No deformity or lesions Neck: Supple, no masses or thyromegaly Lungs: Clear throughout to auscultation Heart: Regular rate and rhythm; no murmurs, rubs or bruits Abdomen: Bowel sounds are decreased.  There is moderate tenderness in the upper midepigastrium without guarding or rebound.  There are no abdominal masses organomegaly Rectal:deferred Musculoskeletal: Symmetrical with no gross deformities  Skin: No lesions on visible extremities Pulses:  Normal pulses noted Extremities: No clubbing, cyanosis, edema or deformities noted Neurological: Alert oriented x 4, grossly nonfocal Cervical Nodes:  No significant cervical adenopathy Inguinal Nodes: No significant inguinal adenopathy Psychological:  Alert and cooperative. Normal mood and affect  See Assessment and Plan under Problem List

## 2014-05-25 ENCOUNTER — Observation Stay (HOSPITAL_COMMUNITY): Payer: 59 | Admitting: Anesthesiology

## 2014-05-25 ENCOUNTER — Encounter (HOSPITAL_COMMUNITY)
Admission: EM | Disposition: A | Discharge status: Home / Self Care | Payer: Self-pay | Source: Home / Self Care | Attending: Gastroenterology

## 2014-05-25 ENCOUNTER — Encounter (HOSPITAL_COMMUNITY): Payer: Self-pay | Admitting: Anesthesiology

## 2014-05-25 ENCOUNTER — Encounter (HOSPITAL_COMMUNITY): Payer: 59 | Admitting: Anesthesiology

## 2014-05-25 ENCOUNTER — Observation Stay (HOSPITAL_COMMUNITY): Payer: 59

## 2014-05-25 DIAGNOSIS — Z79899 Other long term (current) drug therapy: Secondary | ICD-10-CM | POA: Diagnosis not present

## 2014-05-25 DIAGNOSIS — I10 Essential (primary) hypertension: Secondary | ICD-10-CM | POA: Diagnosis present

## 2014-05-25 DIAGNOSIS — R1013 Epigastric pain: Secondary | ICD-10-CM | POA: Diagnosis present

## 2014-05-25 DIAGNOSIS — K8071 Calculus of gallbladder and bile duct without cholecystitis with obstruction: Secondary | ICD-10-CM | POA: Diagnosis present

## 2014-05-25 DIAGNOSIS — R932 Abnormal findings on diagnostic imaging of liver and biliary tract: Secondary | ICD-10-CM

## 2014-05-25 DIAGNOSIS — K219 Gastro-esophageal reflux disease without esophagitis: Secondary | ICD-10-CM | POA: Diagnosis present

## 2014-05-25 DIAGNOSIS — Z7982 Long term (current) use of aspirin: Secondary | ICD-10-CM | POA: Diagnosis not present

## 2014-05-25 DIAGNOSIS — I4891 Unspecified atrial fibrillation: Secondary | ICD-10-CM | POA: Diagnosis present

## 2014-05-25 DIAGNOSIS — E785 Hyperlipidemia, unspecified: Secondary | ICD-10-CM | POA: Diagnosis present

## 2014-05-25 HISTORY — PX: ERCP: SHX5425

## 2014-05-25 LAB — HEPATIC FUNCTION PANEL
ALK PHOS: 338 U/L — AB (ref 39–117)
ALT: 972 U/L — AB (ref 0–53)
AST: 446 U/L — AB (ref 0–37)
Albumin: 3.1 g/dL — ABNORMAL LOW (ref 3.5–5.2)
BILIRUBIN DIRECT: 3.5 mg/dL — AB (ref 0.0–0.3)
BILIRUBIN INDIRECT: 1.2 mg/dL — AB (ref 0.3–0.9)
BILIRUBIN TOTAL: 4.7 mg/dL — AB (ref 0.3–1.2)
Total Protein: 6.2 g/dL (ref 6.0–8.3)

## 2014-05-25 SURGERY — ERCP, WITH INTERVENTION IF INDICATED
Anesthesia: General

## 2014-05-25 MED ORDER — ONDANSETRON HCL 4 MG/2ML IJ SOLN: 4.0000 mg | Freq: Once | INTRAMUSCULAR | Status: AC | PRN

## 2014-05-25 MED ORDER — CIPROFLOXACIN IN D5W 400 MG/200ML IV SOLN
INTRAVENOUS | Status: DC | PRN
  Administered 2014-05-25: 400 mg via INTRAVENOUS

## 2014-05-25 MED ORDER — IOHEXOL 350 MG/ML SOLN
INTRAVENOUS | Status: DC | PRN
  Administered 2014-05-25: 11:00:00

## 2014-05-25 MED ORDER — PROPOFOL 10 MG/ML IV BOLUS
INTRAVENOUS | Status: DC | PRN
  Administered 2014-05-25: 200 mg via INTRAVENOUS

## 2014-05-25 MED ORDER — DEXAMETHASONE SODIUM PHOSPHATE 4 MG/ML IJ SOLN
INTRAMUSCULAR | Status: DC | PRN
  Administered 2014-05-25: 4 mg via INTRAVENOUS

## 2014-05-25 MED ORDER — INDOMETHACIN 50 MG RE SUPP
100.0000 mg | RECTAL | Status: AC
  Administered 2014-05-25: 100 mg via RECTAL
  Filled 2014-05-25: qty 2

## 2014-05-25 MED ORDER — FENTANYL CITRATE 0.05 MG/ML IJ SOLN
INTRAMUSCULAR | Status: DC | PRN
  Administered 2014-05-25: 100 ug via INTRAVENOUS

## 2014-05-25 MED ORDER — ROCURONIUM BROMIDE 100 MG/10ML IV SOLN
INTRAVENOUS | Status: DC | PRN
  Administered 2014-05-25: 35 mg via INTRAVENOUS

## 2014-05-25 MED ORDER — GLYCOPYRROLATE 0.2 MG/ML IJ SOLN
INTRAMUSCULAR | Status: DC | PRN
  Administered 2014-05-25: .7 mg via INTRAVENOUS

## 2014-05-25 MED ORDER — ONDANSETRON HCL 4 MG/2ML IJ SOLN
INTRAMUSCULAR | Status: DC | PRN
  Administered 2014-05-25: 4 mg via INTRAVENOUS

## 2014-05-25 MED ORDER — NEOSTIGMINE METHYLSULFATE 10 MG/10ML IV SOLN
INTRAVENOUS | Status: DC | PRN
  Administered 2014-05-25: 4 mg via INTRAVENOUS

## 2014-05-25 MED ORDER — CIPROFLOXACIN IN D5W 400 MG/200ML IV SOLN
INTRAVENOUS | Status: AC
  Filled 2014-05-25: qty 200

## 2014-05-25 MED ORDER — LACTATED RINGERS IV SOLN
INTRAVENOUS | Status: DC | PRN
  Administered 2014-05-25: 09:00:00 via INTRAVENOUS

## 2014-05-25 MED ORDER — LIDOCAINE HCL (CARDIAC) 20 MG/ML IV SOLN
INTRAVENOUS | Status: DC | PRN
  Administered 2014-05-25: 75 mg via INTRAVENOUS

## 2014-05-25 MED ORDER — FENTANYL CITRATE 0.05 MG/ML IJ SOLN: 25.0000 ug | INTRAMUSCULAR | Status: DC | PRN

## 2014-05-25 NOTE — Progress Notes (Signed)
Pt c/o itching. Notified on call MD. No new orders at this time. Will continue to monitor.

## 2014-05-25 NOTE — Op Note (Signed)
[facility] [place], [id number]   ERCP PROCEDURE REPORT        EXAM DATE: 05/25/2014  PATIENT NAME:          Jorge Peterson, Jorge Peterson          MR #: [id number] BIRTHDATE:       June 17, 1961     VISIT #:     [phone] ATTENDING:     [name], MD     STATUS:     inpatient ASSISTANT:      Cleda Daub and Cristopher Estimable  INDICATIONS:  The patient is a 53 yr old male here for an ERCP due to abdominal pain, elevated liver tests, CT scan suggested distal CBD stone, lap chole 3 weeks ago, normal IOC. PROCEDURE PERFORMED:     ERCP with removal of calculus/calculi ERCP with sphincterotomy/papillotomy MEDICATIONS:     Per Anesthesia, cipro 400mg  IV, indomethacin 100mg  rectal suppository  CONSENT: The patient understands the risks and benefits of the procedure and understands that these risks include, but are not limited to: sedation, allergic reaction, infection, perforation and/or bleeding. Alternative means of evaluation and treatment include, among others: physical exam, x-rays, and/or surgical intervention. The patient elects to proceed with this endoscopic procedure.  DESCRIPTION OF PROCEDURE: During intra-op preparation period all mechanical & medical equipment was checked for proper function. Hand hygiene and appropriate measures for infection prevention was taken. After the risks, benefits and alternatives of the procedure were thoroughly explained, Informed was verified, confirmed and timeout was successfully executed by the treatment team. With the patient in left semi-prone position, medications were administered intravenously.The Pentax Ercp Scope [id number] was passed from the mouth into the esophagus and further advanced from the esophagus into the stomach. From stomach scope was directed to the second portion of the duodenum.  Major papilla was aligned with the duodenoscope. The scope position was confirmed  fluoroscopically. Rest of the findings/therapeutics are given below. The scope was then completely withdrawn from the patient and the procedure completed. The pulse, BP, and O2 saturation were monitored and documented by the physician and the nursing staff throughout the entire procedure. The patient was cared for as planned according to standard protocol. The patient was then discharged to recovery in stable condition and with appropriate post procedure care.  The duodenoscope was advanced to the region of the major papilla without detailed examination of the UGI tract. There appeared to be a stone crowning at the ampulla. A 44 autotome over a .035 hydrawire was used to cannulate the bile duct and contrast was injected.  Cholangiogram revealed non-dilated biliary tree, the cystic duct stump partially opacified, there were no biliary leaks, a small mobile filling defect was noted. A biliary sphincterotomy was performed over the wire and then the bile duct was swept several times with biliary retreival balloon. A smal (41mm) greenish, multifaceted stone was delivered into the duodenum. There was no purulence.  A completion, occlusion cholangiogram showed no further filling defects. The main pancreatic duct was cannulated with the wire once and briefly injected with dye.   ADVERE EVENT:     none  IMPRESSIONS:     Choledocholithiasis, treated with biliary sphincterotomy and balloon sweeping  RECOMMENDATIONS:      Will advance diet as tolerated, hopefully home tomorrow.   __________________________________ [name], MD eSigned:  [name], MD 05/25/2014 10:31 AM   cc: Zenovia Jarred, MD; Erskine Emery, MD  CPT CODES: ICD9 CODES:  The ICD and CPT codes recommended by this software are interpretations from the data that the clinical staff has captured with the software.  The verification of the translation of this report to the ICD and CPT codes and modifiers is the  sole responsibility of the health care institution and practicing physician where this report was generated.  [employer] will not be held responsible for the validity of the ICD and CPT codes included on this report.  AMA assumes no liability for data contained or not contained herein. CPT is a Designer, television/film set of the [employer].   PATIENT NAME:  Jorge Peterson, Jorge Peterson MR#: [id number]

## 2014-05-25 NOTE — Interval H&P Note (Signed)
History and Physical Interval Note:  05/25/2014 9:34 AM  Jorge Peterson  has presented today for surgery, with the diagnosis of ?stones  The various methods of treatment have been discussed with the patient and family. After consideration of risks, benefits and other options for treatment, the patient has consented to  Procedure(s): ENDOSCOPIC RETROGRADE CHOLANGIOPANCREATOGRAPHY (ERCP) (N/A) as a surgical intervention .  The patient's history has been reviewed, patient examined, no change in status, stable for surgery.  I have reviewed the patient's chart and labs.  Questions were answered to the patient's satisfaction.     [name]

## 2014-05-25 NOTE — Anesthesia Preprocedure Evaluation (Signed)
Anesthesia Evaluation  Patient identified by MRN, date of birth, ID band Patient awake    Airway Mallampati: I TM Distance: >3 FB Neck ROM: Full    Dental  (+) Dental Advisory Given, Teeth Intact   Pulmonary  breath sounds clear to auscultation        Cardiovascular hypertension, Pt. on medications Rhythm:Regular Rate:Normal     Neuro/Psych    GI/Hepatic GERD-  Medicated and Controlled,  Endo/Other    Renal/GU      Musculoskeletal   Abdominal   Peds  Hematology   Anesthesia Other Findings   Reproductive/Obstetrics                           Anesthesia Physical Anesthesia Plan  ASA: II  Anesthesia Plan: General   Post-op Pain Management:    Induction: Intravenous  Airway Management Planned: Oral ETT  Additional Equipment:   Intra-op Plan:   Post-operative Plan: Extubation in OR  Informed Consent: I have reviewed the patients History and Physical, chart, labs and discussed the procedure including the risks, benefits and alternatives for the proposed anesthesia with the patient or authorized representative who has indicated his/her understanding and acceptance.   Dental advisory given  Plan Discussed with: CRNA, Anesthesiologist and Surgeon  Anesthesia Plan Comments:         Anesthesia Quick Evaluation

## 2014-05-25 NOTE — Transfer of Care (Signed)
Immediate Anesthesia Transfer of Care Note  Patient: Jorge Peterson  Procedure(s) Performed: Procedure(s): ENDOSCOPIC RETROGRADE CHOLANGIOPANCREATOGRAPHY (ERCP) (N/A)  Patient Location: PACU and Endoscopy Unit  Anesthesia Type:General  Level of Consciousness: awake, alert  and oriented  Airway & Oxygen Therapy: Patient Spontanous Breathing and Patient connected to nasal cannula oxygen  Post-op Assessment: Report given to PACU RN and Post -op Vital signs reviewed and stable  Post vital signs: Reviewed and stable  Complications: No apparent anesthesia complications

## 2014-05-25 NOTE — Progress Notes (Signed)
UR completed 

## 2014-05-25 NOTE — Anesthesia Postprocedure Evaluation (Signed)
  Anesthesia Post-op Note  Patient: Jorge Peterson  Procedure(s) Performed: Procedure(s): ENDOSCOPIC RETROGRADE CHOLANGIOPANCREATOGRAPHY (ERCP) (N/A)  Patient Location: PACU  Anesthesia Type: General   Level of Consciousness: awake, alert  and oriented  Airway and Oxygen Therapy: Patient Spontanous Breathing  Post-op Pain: mild  Post-op Assessment: Post-op Vital signs reviewed  Post-op Vital Signs: Reviewed  Last Vitals:  Filed Vitals:   05/25/14 1120  BP: 146/71  Pulse: 53  Temp:   Resp: 15    Complications: No apparent anesthesia complications

## 2014-05-25 NOTE — Anesthesia Procedure Notes (Signed)
Procedure Name: Intubation Date/Time: 05/25/2014 9:44 AM Performed by: Eligha Bridegroom Pre-anesthesia Checklist: Emergency Drugs available, Patient identified, Timeout performed, Suction available and Patient being monitored Patient Re-evaluated:Patient Re-evaluated prior to inductionOxygen Delivery Method: Circle system utilized Preoxygenation: Pre-oxygenation with 100% oxygen Intubation Type: IV induction Ventilation: Mask ventilation without difficulty and Oral airway inserted - appropriate to patient size Grade View: Grade III Tube size: 7.5 mm Number of attempts: 1 Airway Equipment and Method: Patient positioned with wedge pillow and Video-laryngoscopy Placement Confirmation: ETT inserted through vocal cords under direct vision,  positive ETCO2 and breath sounds checked- equal and bilateral Secured at: 23 cm Tube secured with: Tape Difficulty Due To: Difficulty was anticipated, Difficult Airway- due to anterior larynx and Difficult Airway- due to reduced neck mobility

## 2014-05-26 LAB — COMPREHENSIVE METABOLIC PANEL
ALBUMIN: 2.9 g/dL — AB (ref 3.5–5.2)
ALT: 751 U/L — AB (ref 0–53)
AST: 246 U/L — AB (ref 0–37)
Alkaline Phosphatase: 324 U/L — ABNORMAL HIGH (ref 39–117)
Anion gap: 11 (ref 5–15)
BUN: 12 mg/dL (ref 6–23)
CALCIUM: 8.9 mg/dL (ref 8.4–10.5)
CO2: 29 mEq/L (ref 19–32)
Chloride: 104 mEq/L (ref 96–112)
Creatinine, Ser: 1.08 mg/dL (ref 0.50–1.35)
GFR calc Af Amer: 87 mL/min — ABNORMAL LOW (ref >90)
GFR calc non Af Amer: 75 mL/min — ABNORMAL LOW (ref >90)
GLUCOSE: 142 mg/dL — AB (ref 70–99)
Potassium: 3.8 mEq/L (ref 3.7–5.3)
SODIUM: 144 meq/L (ref 137–147)
TOTAL PROTEIN: 6.2 g/dL (ref 6.0–8.3)
Total Bilirubin: 1.8 mg/dL — ABNORMAL HIGH (ref 0.3–1.2)

## 2014-05-26 LAB — CBC
HCT: 34.5 % — ABNORMAL LOW (ref 39.0–52.0)
Hemoglobin: 11.5 g/dL — ABNORMAL LOW (ref 13.0–17.0)
MCH: 27.1 pg (ref 26.0–34.0)
MCHC: 33.3 g/dL (ref 30.0–36.0)
MCV: 81.4 fL (ref 78.0–100.0)
PLATELETS: 159 10*3/uL (ref 150–400)
RBC: 4.24 MIL/uL (ref 4.22–5.81)
RDW: 12.4 % (ref 11.5–15.5)
WBC: 8.3 10*3/uL (ref 4.0–10.5)

## 2014-05-26 MED ORDER — ACETAMINOPHEN 325 MG PO TABS
650.0000 mg | ORAL_TABLET | Freq: Once | ORAL | Status: AC
  Administered 2014-05-26: 650 mg via ORAL
  Filled 2014-05-26: qty 2

## 2014-05-26 NOTE — Progress Notes (Signed)
[facility] Progress Note    Since last GI note: Has a HA this AM but belly feels fine.  No abd pains, eating regular diet.  Objective: Vital signs in last 24 hours: Temp:  [97.6 F (36.4 C)-98.5 F (36.9 C)] 98.2 F (36.8 C) (09/27 0937) Pulse Rate:  [46-64] 57 (09/27 0937) Resp:  [14-20] 18 (09/27 0937) BP: (123-146)/(48-78) 143/78 mmHg (09/27 0937) SpO2:  [92 %-100 %] 98 % (09/27 0937) Weight:  [216 lb (97.977 kg)] 216 lb (97.977 kg) (09/26 2029) Last BM Date: 05/23/14 General: alert and oriented times 3 Heart: regular rate and rythm Abdomen: soft, non-tender, non-distended, normal bowel sounds   Lab Results:  Recent Labs  05/24/14 1155 05/26/14 0630  WBC 8.0 8.3  HGB 13.8 11.5*  PLT 239 159  MCV 81.8 81.4    Recent Labs  05/24/14 1155 05/26/14 0630  NA 142 144  K 4.2 3.8  CL 101 104  CO2 27 29  GLUCOSE 157* 142*  BUN 17 12  CREATININE 1.17 1.08  CALCIUM 10.6* 8.9    Recent Labs  05/24/14 1155 05/25/14 0602 05/26/14 0630  PROT 7.7 6.2 6.2  ALBUMIN 3.9 3.1* 2.9*  AST 867* 446* 246*  ALT 1214* 972* 751*  ALKPHOS 359* 338* 324*  BILITOT 2.6* 4.7* 1.8*  BILIDIR  --  3.5*  --   IBILI  --  1.2*  --     Recent Labs  05/24/14 1155  INR 0.96      Medications: Scheduled Meds: . acetaminophen  650 mg Oral Once   Continuous Infusions: . sodium chloride 75 mL/hr at 05/26/14 0700   PRN Meds:.fentaNYL, fluticasone, HYDROmorphone (DILAUDID) injection, ondansetron (ZOFRAN) IV, ondansetron, simethicone, traZODone    Assessment/Plan: 53 y.o. male with retained CBD stone causing obstruction, treated yesterday with ERCP, sphincterotomy  Doing well after ERCP.  No clinical pancreatitis Will discharge home today.  He has follow up apt with surgeons on Tuesday already scheduled.    [name], MD  05/26/2014, 9:38 AM [facility] Pager [phone]

## 2014-05-26 NOTE — Discharge Summary (Signed)
Discharge Summary:  Name: Jorge Peterson MRN: [id number] DOB: May 01, 1961 53 y.o. PCP:  [name], MD  Date of Admission: 05/24/2014 11:35 AM Date of Discharge: 05/26/2014 Attending Physician: Dr Owens Loffler.     Admitting Dignosis: Active Problems:   Common bile duct (CBD) obstruction   Choledocholithiasis  Discharge Diagnosis: Active Problems:   Common bile duct (CBD) obstruction   Choledocholithiasis    Previous Medical/Surgical history Past Medical History  Diagnosis Date  . Atrial fibrillation     s/p afib ablation 7/12  . Hyperlipidemia   . Bradycardia     asymptomatic  . Diverticulitis   . Hypertension   . Shortness of breath   . GERD (gastroesophageal reflux disease)   . Headache(784.0)   . Family history of anesthesia complication     father gets pneumonia after "   Past Surgical History  Procedure Laterality Date  . Shoulder surgery      x 2  . Back surgery    . Atrial ablation surgery  03/24/11    PVI by JA  . Fistula surgery    . Tonsillectomy    . Bronchoscopy  01/23/2013     biopsy  . Video bronchoscopy with endobronchial ultrasound N/A 01/23/2013    Procedure: VIDEO BRONCHOSCOPY WITH ENDOBRONCHIAL ULTRASOUND;  Surgeon: Ivin Poot, MD;  Location: [facility];  Service: Thoracic;  Laterality: N/A;  . Mediastinoscopy N/A 01/23/2013    Procedure: MEDIASTINOSCOPY;  Surgeon: Ivin Poot, MD;  Location: [facility];  Service: Thoracic;  Laterality: N/A;  . Cholecystectomy  05/02/2014    LAP CHOLE  . Cholecystectomy N/A 05/02/2014    Procedure: LAPAROSCOPIC CHOLECYSTECTOMY WITH INTRAOPERATIVE CHOLANGIOGRAM;  Surgeon: Erroll Luna, MD;  Location: [facility];  Service: General;  Laterality: N/A;    Brief History: Mr Rollo is known to Dr Hilarie Fredrickson for history of  diverticulitis. He underwent lap chole with negative IOC on 9/3 by Dr Brantley Stage.  Since surgery he has had episodic upper abdominal pain and bloating.  He was seen at GI office on 9/10.  Sxs were attributed to recent surgery. He was advised to keep taking his Prilosec 40 mg daily, probiotic ahd gas-ex prn.   Small meals and avoidance of fatty foods was reccommended. He was advised to recontact GI if sxs did not improve over next few weeks.  Episodes of pain continued, and he had particularly severe pain the evening of 9/24 which was associated with nausea and vomiting. Seen in office 9/25 where further hx of 15 # weight loss was obtained.  He was sent to ED and labs revealed AST/ALT of 867/1214, alk phos 359, t bil 2.6, lipase 30.  CT scan showed likely stone in distal CBD, no pancreatitis, no dilation of bile ducts.  He was admitted to GI service in order to undergo ERCP.    Hospital Course by problem list: 1.  Choledocholithiasis Pt underwent uneventful ERCP with sphincterotomy and baloon sweepings with delivery of a stone on 9/26.  He received dose of rectal indomethacin after the procedure as prophylaxis against pancreatitis.  After the procedure he was observed  overnight.  The following morning he was feeling well, having no abdominal pain.  Was tolerating regular diet.  His LFTs were improving steadily, though not yet at normal levels.  Dr Ardis Hughs deemed pt stable to discharge home on low fat diet.  Pt has previously arranged surgical follow up on 9/29.  He will not need to see Dr Ardis Hughs or Pyrtle unless he is having further GI related problems. .     Wt Readings from Last 1 Encounters:  05/25/14 97.977 kg (216 lb)    Discharge Medications:   Medication List         aspirin EC 81 MG tablet  Take 81 mg by mouth daily.     Biotin 1000 MCG tablet  Take 1,000 mcg by mouth daily.     COQ10 150 MG Caps  Take 150 mg by mouth daily.     fish oil-omega-3 fatty acids 1000 MG capsule  Take 1 g  by mouth daily.     fluticasone 50 MCG/ACT nasal spray  Commonly known as:  FLONASE  Place 1 spray into the nose daily as needed for allergies.     folic acid 010 MCG tablet  Commonly known as:  FOLVITE  Take 400 mcg by mouth daily.     GAS-X PO  Take by mouth as needed (gas.).     hydrochlorothiazide 12.5 MG capsule  Commonly known as:  MICROZIDE  Take 12.5 mg by mouth daily.     losartan 100 MG tablet  Commonly known as:  COZAAR  Take 100 mg by mouth daily.     multivitamin tablet  Take 1 tablet by mouth daily.     omeprazole 40 MG capsule  Commonly known as:  PRILOSEC  Take 1 capsule (40 mg total) by mouth daily.     PROBIOTIC FORMULA PO  Take 1 capsule by mouth daily.     ROLAIDS PO  Take by mouth as needed (indigestion or reflux.).     simvastatin 40 MG tablet  Commonly known as:  ZOCOR  Take 40 mg by mouth at bedtime.     traZODone 50 MG tablet  Commonly known as:  DESYREL  Take 50 mg by mouth at bedtime as needed for sleep.        Consultations:  None   Procedures Performed:  ERCP 05/25/14 Dr Ardis Hughs IMPRESSIONS: Choledocholithiasis, treated with biliary sphincterotomy.  Balloon sweepings reulted in delivery of greenish, multifaceted stone.  Non-dilated biliary tree.    RADIOLOGY STUDIES Ct Abdomen Pelvis Wo Contrast 05/24/2014     COMPARISON:  CT scan of the abdomen pelvis of December 21, 2011.  FINDINGS: There is mildly increased density within the fat at the gallbladder bed consistent with postsurgical change. There is no free fluid or free air nor evidence of an abnormal fluid collection. The liver exhibits no focal mass nor ductal dilation. There is a 2 mm diameter calcification which lies in the region of the ampulla of Vater on image 40 of series 2 that is worrisome for a retained common bile duct stone. There is no common bile ductal dilation.  The pancreas, spleen, partially distended stomach, adrenal glands, and kidneys are normal. The small and  large bowel exhibit no evidence of ileus nor obstruction. The urinary bladder, prostate gland, and seminal vesicles are normal. There is no inguinal nor significant umbilical hernia. There is no free fluid in the abdomen or pelvis. There is no evidence of an abdominal wall abscess or hematoma.  The  lumbar spine and bony pelvis exhibit no acute abnormalities. There are degenerative disc changes at L5-S1. The lung bases exhibit minimal compressive atelectasis.  IMPRESSION: 1. The findings are worrisome for a retained 2 mm diameter calcified common bile duct stone. It appears to lie at the level of the ampulla of Vater. No common bile ductal dilation is demonstrated. There is no evidence of acute pancreatitis. 2. There is no acute abnormality within the gallbladder bed. Minimal postsurgical changes are present. 3. There is no acute abnormality elsewhere within the abdomen or pelvis. 4. These results were called by telephone at the time of interpretation on 05/24/2014 at 12:32 pm to [facility], who verbally acknowledged these results and will give them to Dr.Ray .   Electronically Signed   By: David  Martinique   On: 05/24/2014 12:37   Dg Chest 2 View 05/24/2014   CLINICAL DATA:  Short of breath.  Pain.  EXAM: CHEST  2 VIEW  COMPARISON:  04/30/2014.  FINDINGS: There are low lung volumes. Lungs are clear. No pleural effusion or pneumothorax.  Heart, mediastinum hila are unremarkable.  Bony thorax is intact.  IMPRESSION: No active cardiopulmonary disease.   Electronically Signed   By: Lajean Manes M.D.   On: 05/24/2014 12:23   Dg Ercp Biliary & Pancreatic Ducts 05/25/2014   CLINICAL DATA:  Retained common bile duct stone.  EXAM: ERCP  TECHNIQUE: Multiple spot images obtained with the fluoroscopic device and submitted for interpretation post-procedure.  COMPARISON:  CT 05/24/2014.  FINDINGS: Contrast is noted in the biliary system. Small filling defect in the distal common bile duct noted. Findings suggesting ERCP removal  of retained distal common bile duct stone. Correlation with endoscopic findings suggested. Contrast empties into the duodenum.  IMPRESSION: Findings consistent with distal common bile duct stone removal .  These images were submitted for radiologic interpretation only. Please see the procedural report for the amount of contrast and the fluoroscopy time utilized.   Electronically Signed   By: Marcello Moores  Register   On: 05/25/2014 10:50    Discharge Labs:  Results for orders placed during the hospital encounter of 05/24/14 (from the past 24 hour(s))  CBC     Status: Abnormal   Collection Time    05/26/14  6:30 AM      Result Value Ref Range   WBC 8.3  4.0 - 10.5 K/uL   RBC 4.24  4.22 - 5.81 MIL/uL   Hemoglobin 11.5 (*) 13.0 - 17.0 g/dL   HCT 34.5 (*) 39.0 - 52.0 %   MCV 81.4  78.0 - 100.0 fL   MCH 27.1  26.0 - 34.0 pg   MCHC 33.3  30.0 - 36.0 g/dL   RDW 12.4  11.5 - 15.5 %   Platelets 159  150 - 400 K/uL  COMPREHENSIVE METABOLIC PANEL     Status: Abnormal   Collection Time    05/26/14  6:30 AM      Result Value Ref Range   Sodium 144  137 - 147 mEq/L   Potassium 3.8  3.7 - 5.3 mEq/L   Chloride 104  96 - 112 mEq/L   CO2 29  19 - 32 mEq/L   Glucose, Bld 142 (*) 70 - 99 mg/dL   BUN 12  6 - 23 mg/dL   Creatinine, Ser 1.08  0.50 - 1.35 mg/dL   Calcium 8.9  8.4 - 10.5 mg/dL   Total Protein 6.2  6.0 - 8.3 g/dL   Albumin 2.9 (*) 3.5 -  5.2 g/dL   AST 246 (*) 0 - 37 U/L   ALT 751 (*) 0 - 53 U/L   Alkaline Phosphatase 324 (*) 39 - 117 U/L   Total Bilirubin 1.8 (*) 0.3 - 1.2 mg/dL   GFR calc non Af Amer 75 (*) >90 mL/min   GFR calc Af Amer 87 (*) >90 mL/min   Anion gap 11  5 - 15    Disposition and follow-up:   Mr.Coby D Mihalko was discharged from  in stable condition.  Follow up appointments:  Pt has previously arranged surgical follow up on 9/29.  He will not need to see Dr Ardis Hughs or Pyrtle unless he is having further GI related problems. .       Discharge Instructions   Diet -  low sodium heart healthy    Complete by:  As directed      Diet Heart    Complete by:  As directed      Discharge instructions    Complete by:  As directed   Keep follow up appointment with surgery this week.     Discharge patient    Complete by:  As directed      Discontinue IV    Complete by:  As directed      Increase activity slowly    Complete by:  As directed              Time Spent on discharge:  40 minutes   Signed: Mliss Sax Santina Evans  [phone] 05/26/2014, 12:27 PM

## 2014-05-26 NOTE — Progress Notes (Signed)
Discussed discharge instructions and medications with patient.  All questions answered.

## 2014-05-27 ENCOUNTER — Encounter (HOSPITAL_COMMUNITY): Payer: Self-pay | Admitting: Gastroenterology

## 2014-12-21 NOTE — H&P (Signed)
   Subjective/Chief Complaint Epigastric/RUQ pain x 8 hours   History of Present Illness [name] is a pleasant 54 yo M with a pmh of HTN, arrhythmias s/p ablation who presents with acute onset epigastric pain.  Says that it began immediately this am.  Drank some V8 use just prior to this.  Was doing fine before this.  Radiates both sides. Never had this pain before.  normal BM today at 10 am.  +subjective fevers and chills.  Tolerating diet, no N/V.  U/S shows mildly thickened gb with sludge.   Past History HTN Arrhythmias s/p ablation H/o lung mass, biopsied/benign   Past Medical Health Hypertension   Code Status Full Code   ALLERGIES:  No Known Allergies:   Family and Social History:  Family History Coronary Artery Disease  Hypertension  Diabetes Mellitus  Cancer   Social History negative tobacco, negative ETOH   Place of [place] [facility] [place]   Review of Systems:  Subjective/Chief Complaint Epigastric/RUQ pain radiating to back   Fever/Chills Yes   Cough No   Sputum No   Abdominal Pain Yes   Diarrhea No   Constipation No   Nausea/Vomiting No   SOB/DOE No   Dysuria No   Tolerating Diet Yes   Physical Exam:  GEN well developed, well nourished, no acute distress   HEENT pink conjunctivae, PERRL, hearing intact to voice, moist oral mucosa   RESP normal resp effort  clear BS  no use of accessory muscles   CARD regular rate  no murmur   ABD positive tenderness  no hernia  soft  normal BS  Tender epigastric and RUQ   LYMPH negative neck, negative axillae   EXTR negative cyanosis/clubbing, negative edema   SKIN normal to palpation, No rashes, No ulcers, skin turgor good   NEURO cranial nerves intact, negative Babinski R/L, negative rigidity, follows commands, strength:, motor/sensory function intact   PSYCH alert, A+O to time, place, person, good insight    Assessment/Admission Diagnosis 54 yo M with recent onset epigastric /RUQ pain.  Tender  to palpation RUQ.  + sludge, gb wall thickening.  Concern for sludge cholecystitis vs PUD.   Plan Admit, IVF, HIDA scan in am.  Gi consult if negative HIDA and still with pain.   Electronic Signatures: Floyde Parkins (MD)  (Signed 01-Sep-15 18:43)  Authored: CHIEF COMPLAINT and HISTORY, ALLERGIES, FAMILY AND SOCIAL HISTORY, REVIEW OF SYSTEMS, PHYSICAL EXAM, ASSESSMENT AND PLAN   Last Updated: 01-Sep-15 18:43 by Floyde Parkins (MD)

## 2015-02-03 ENCOUNTER — Telehealth: Payer: Self-pay | Admitting: Internal Medicine

## 2015-02-03 ENCOUNTER — Other Ambulatory Visit: Payer: Self-pay | Admitting: *Deleted

## 2015-02-03 MED ORDER — CIPROFLOXACIN HCL 500 MG PO TABS: ORAL_TABLET | ORAL | Status: DC

## 2015-02-03 MED ORDER — METRONIDAZOLE 500 MG PO TABS: ORAL_TABLET | ORAL | Status: DC

## 2015-02-03 NOTE — Telephone Encounter (Signed)
Spoke with patient and is having a diverticulitis flare. States the pain started yesterday and is level with his belly button. He is having slight constipation. Hx diverticulitis. Please, advise.

## 2015-02-03 NOTE — Telephone Encounter (Signed)
Spoke with patient and he has not had any problems with Cipro and Flagyl. Rx's sent. Recommendations given.

## 2015-02-03 NOTE — Telephone Encounter (Signed)
cipro 500 BID, flagyl 500 TID x 10 days I remember he may have had issue with cipro/flagyl in the past?? Let me know if so Call if worsening or not quickly improving

## 2015-02-06 IMAGING — CR DG CHEST 2V
2 series · 2 of 2 positions shown · non-contrast
Comparison: 04/30/2014.

CLINICAL DATA: Short of breath.  Pain.

EXAM:
CHEST  2 VIEW

[w chest pa]
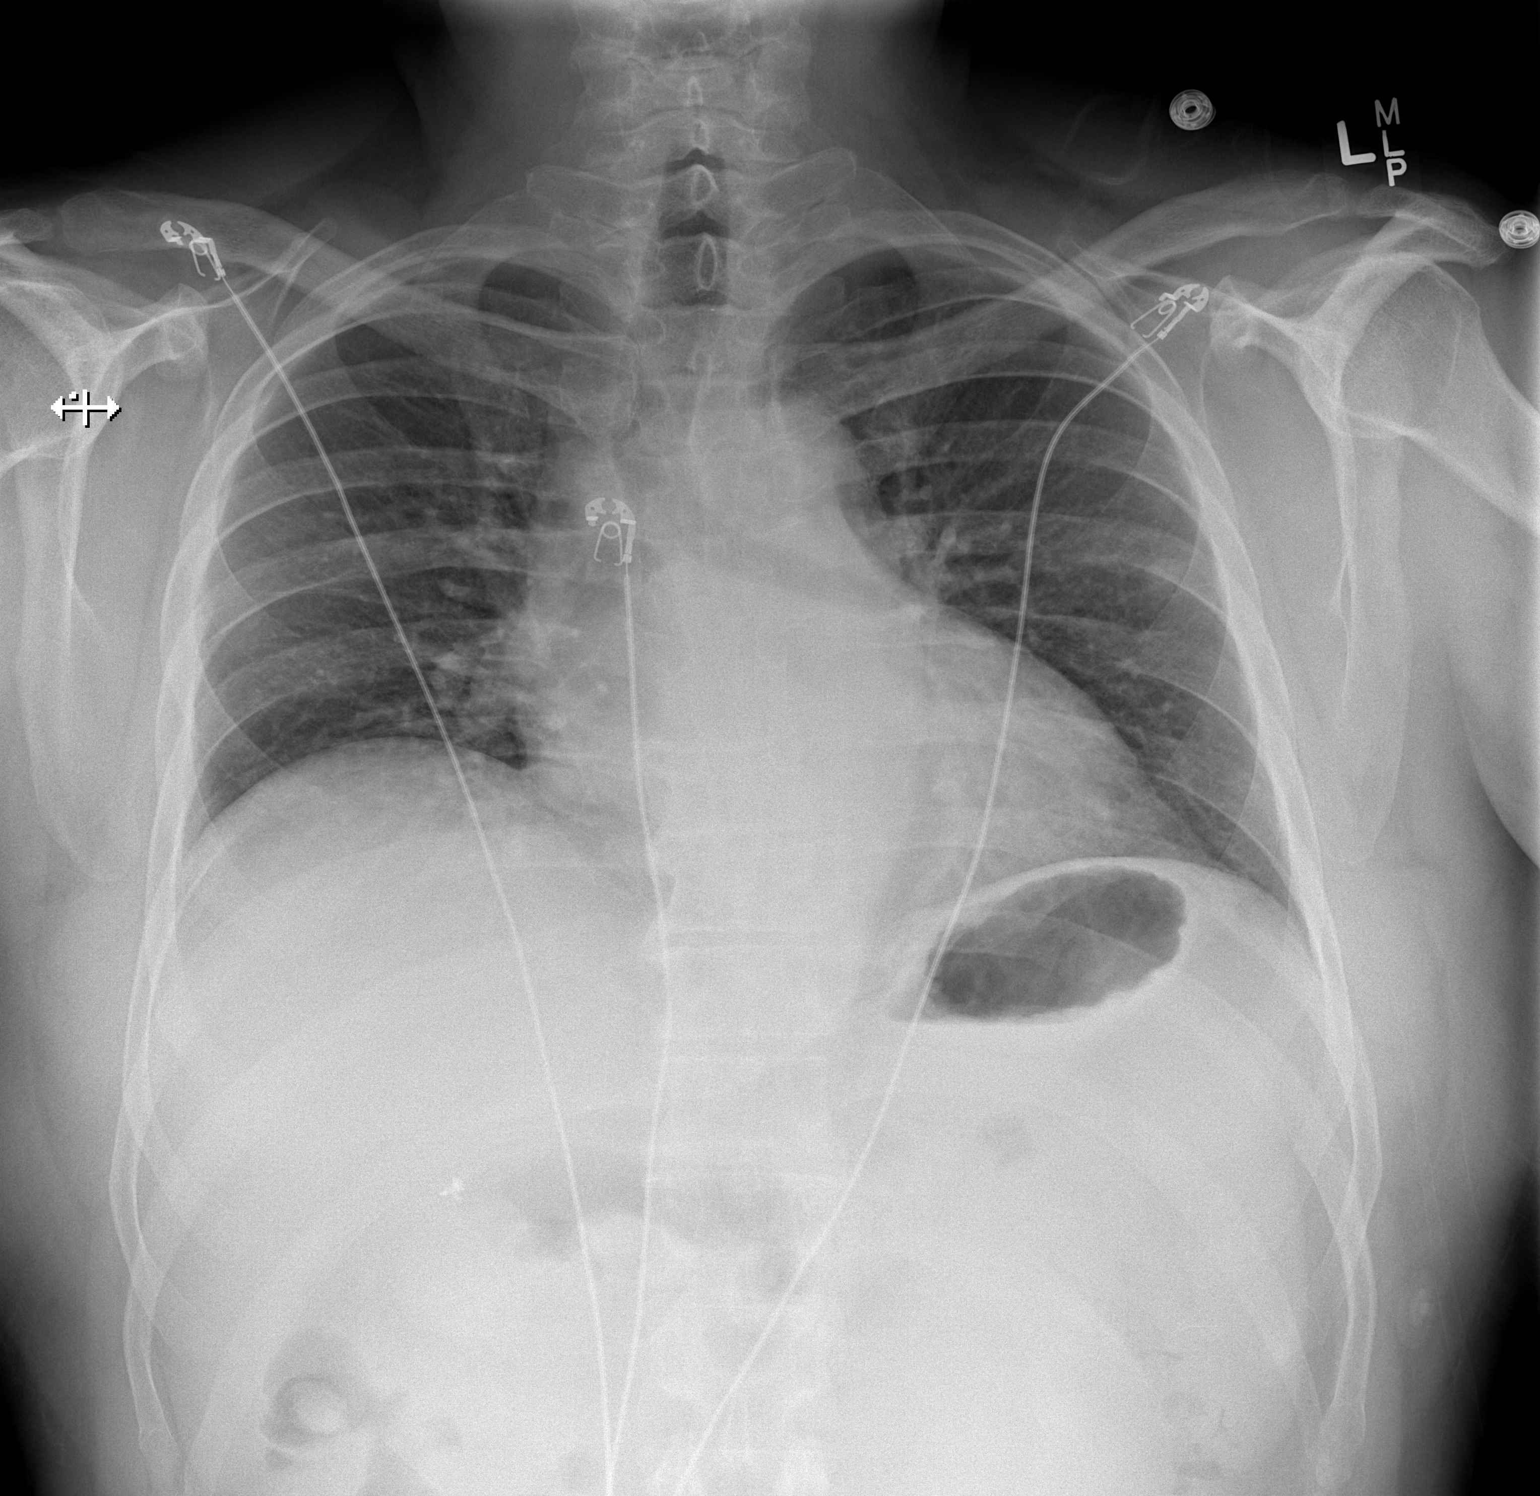

[w chest lat]
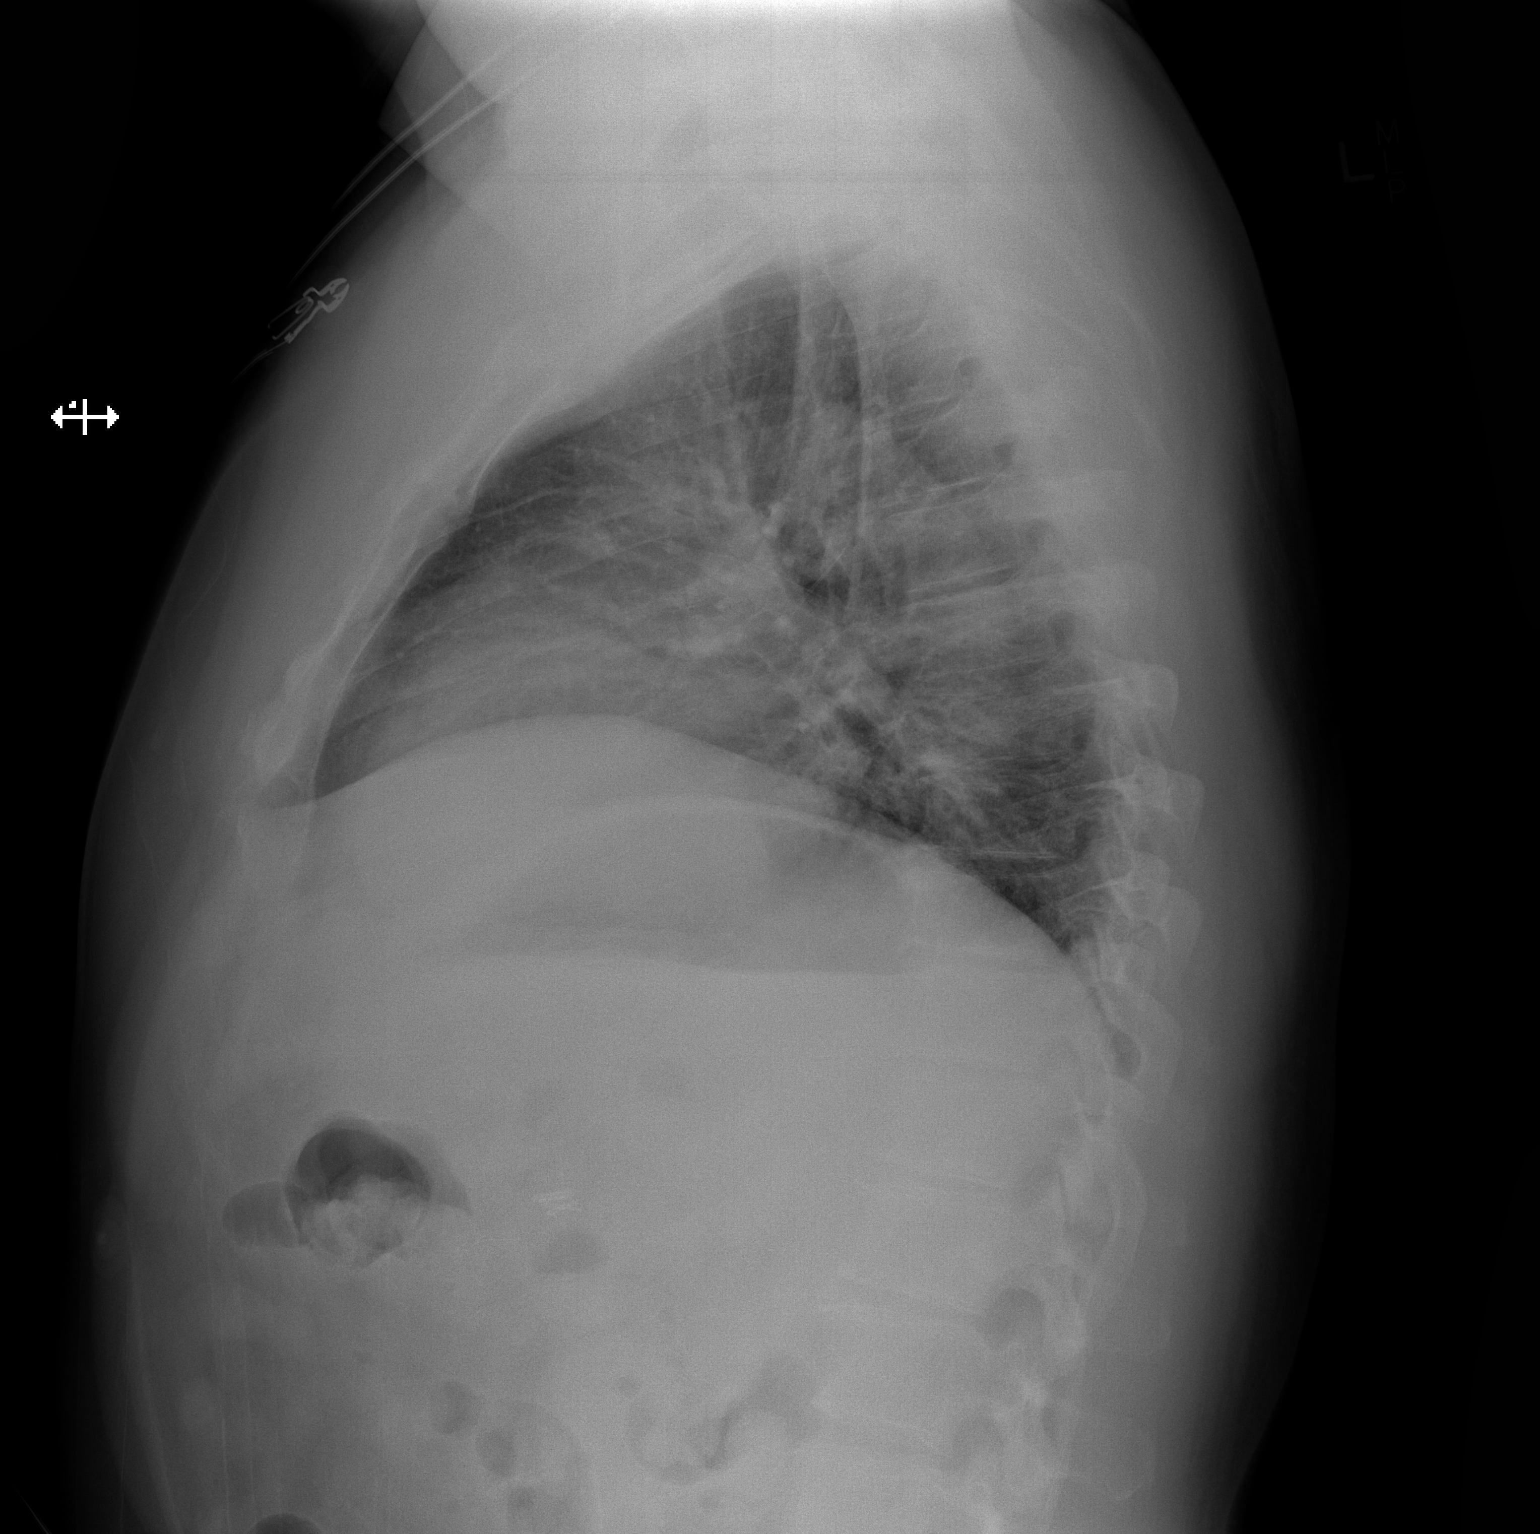

[2 of 2 positions shown; findings below may reference images not displayed]

FINDINGS: There are low lung volumes. Lungs are clear. No pleural effusion or
pneumothorax.

Heart, mediastinum hila are unremarkable.

Bony thorax is intact.
IMPRESSION: No active cardiopulmonary disease.

## 2015-02-06 IMAGING — CT CT ABD-PELV W/O CM
1 series · 15 of 23 positions shown, 19 images · non-contrast
Comparison: CT scan of the abdomen pelvis of December 21, 2011.

CLINICAL DATA: Intermittent upper abdominal pain radiating into the
back for the past 3 weeks becoming constant yesterday; history of
cholecystectomy 3 weeks ago.

EXAM:
CT ABDOMEN AND PELVIS WITHOUT CONTRAST
TECHNIQUE: Multidetector CT imaging of the abdomen and pelvis was performed
following the standard protocol without IV contrast.

[Series 6: lung · axial · 0.96mm/px · z∈[+1043,+1143]mm · 15 of 23 slices shown, 19 images]
[im 2/23  soft-tissue]
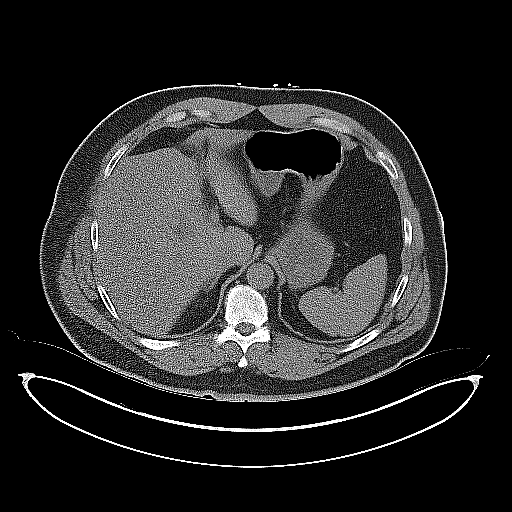
[im 2/23  bone]
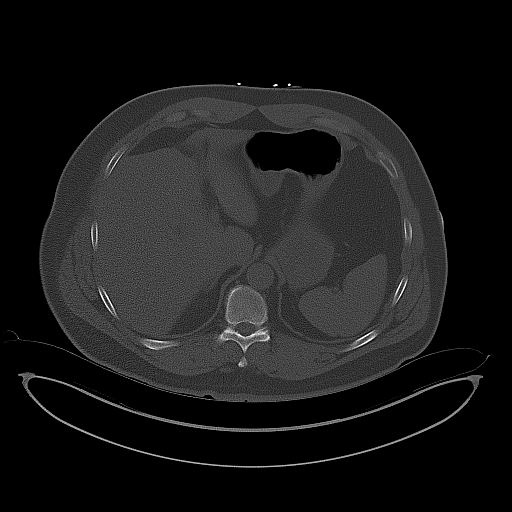
[im 4/23  soft-tissue]
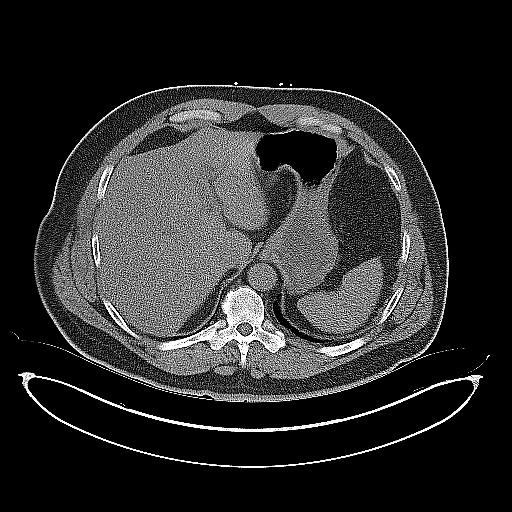
[im 6/23  soft-tissue]
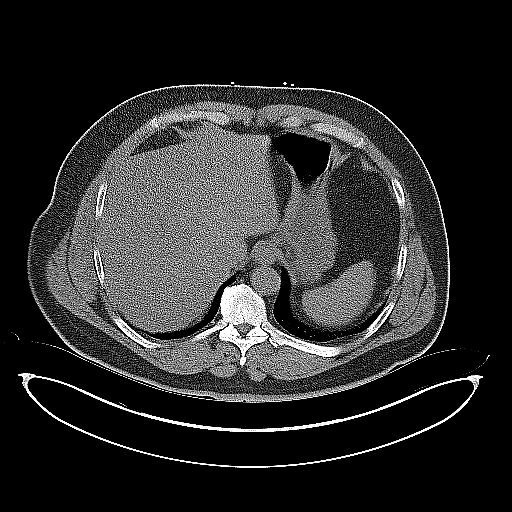
[im 7/23  soft-tissue]
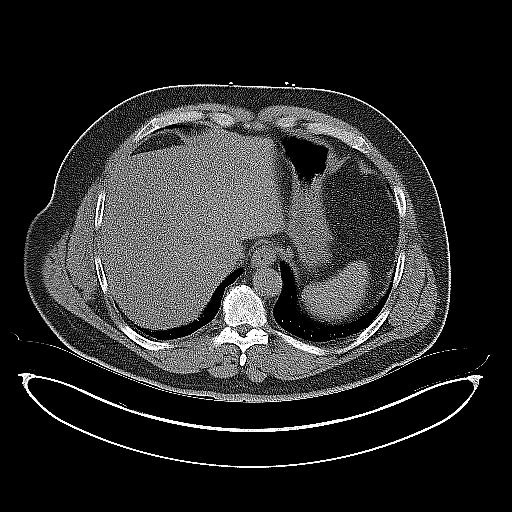
[im 9/23  soft-tissue]
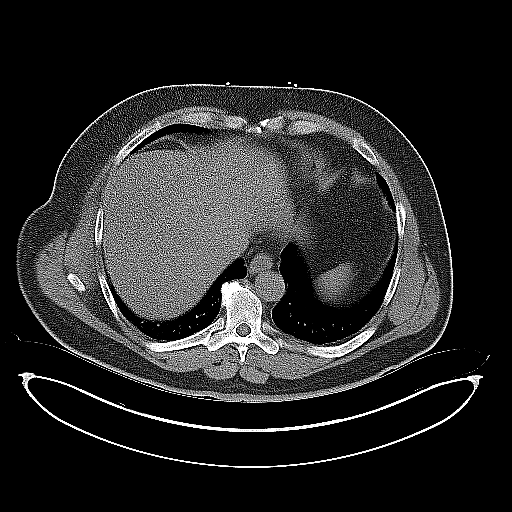
[im 10/23  soft-tissue]
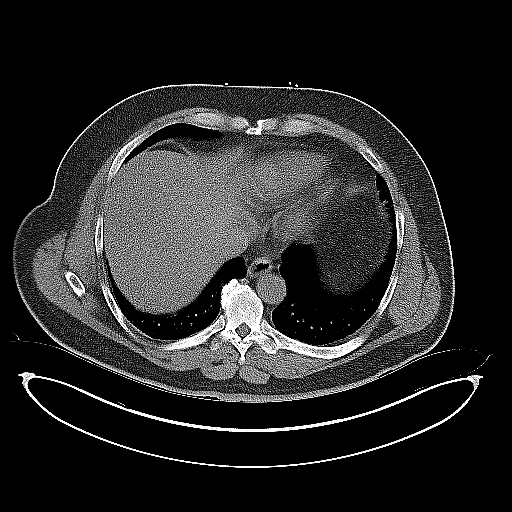
[im 12/23  soft-tissue]
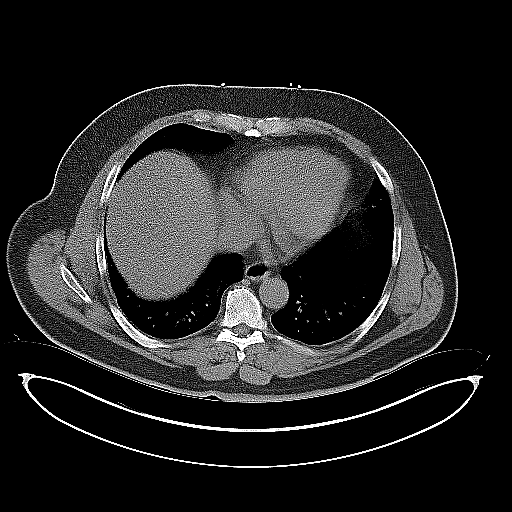
[im 14/23  soft-tissue]
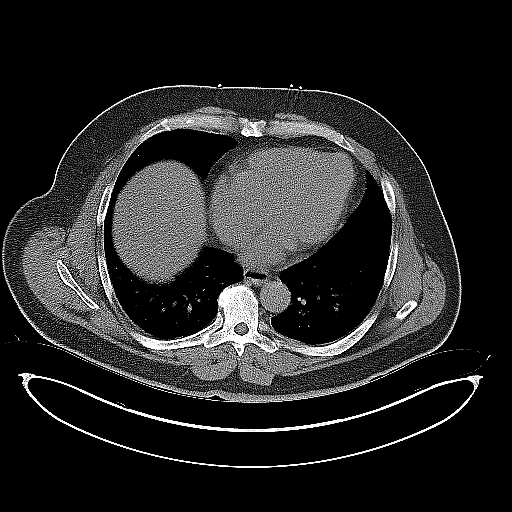
[im 15/23  soft-tissue]
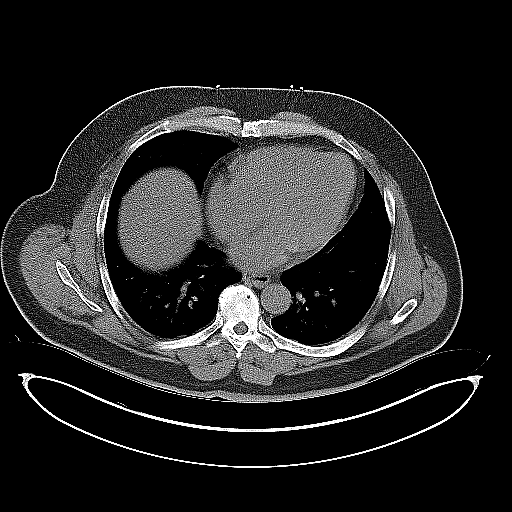
[im 15/23  bone]
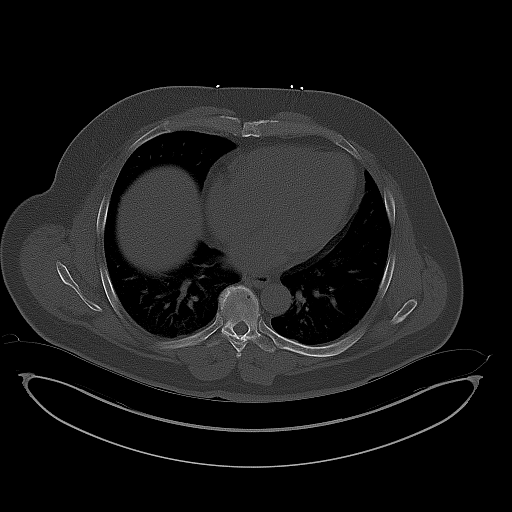
[im 17/23  soft-tissue]
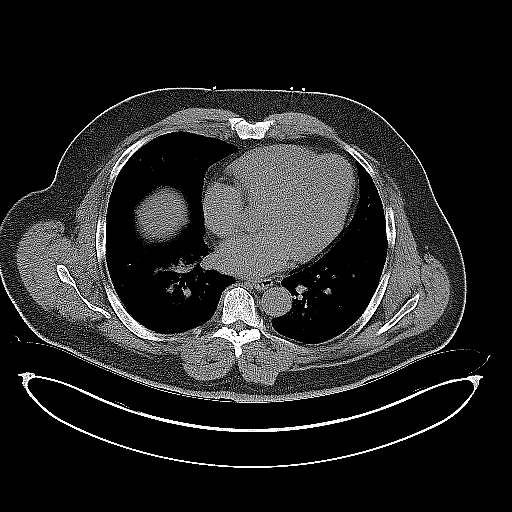
[im 18/23  soft-tissue]
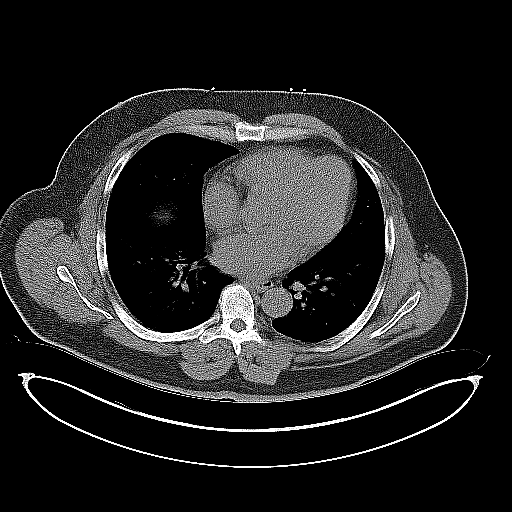
[im 19/23  lung]
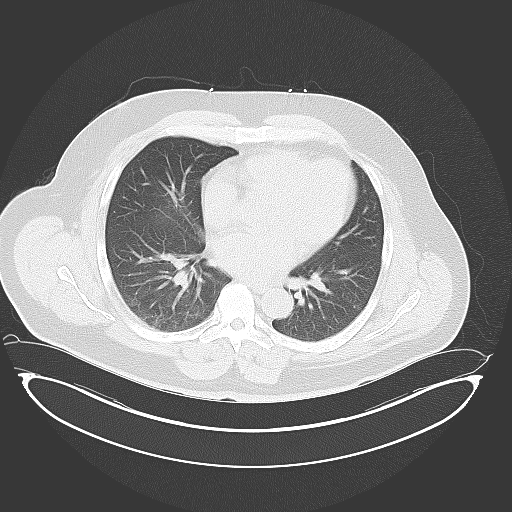
[im 20/23  soft-tissue]
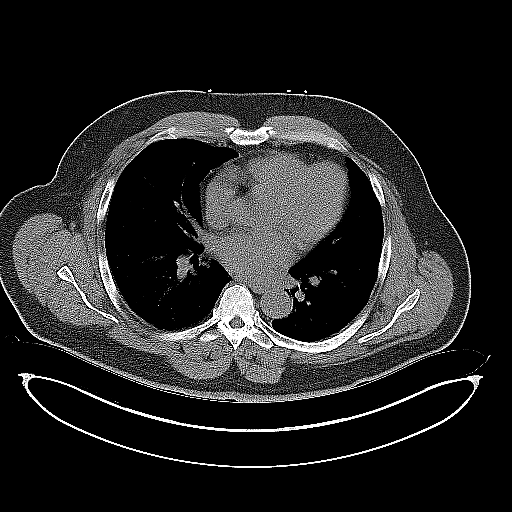
[im 20/23  lung]
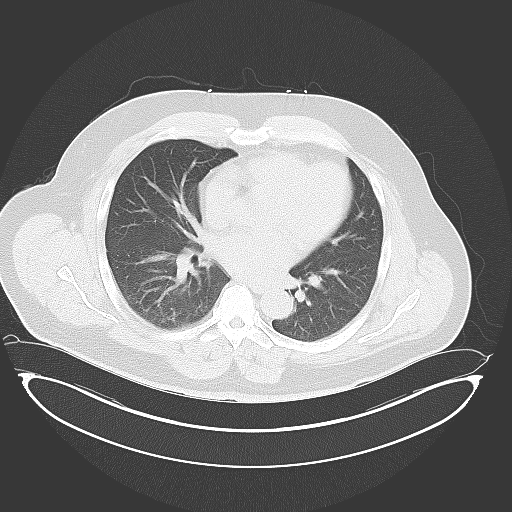
[im 21/23  lung]
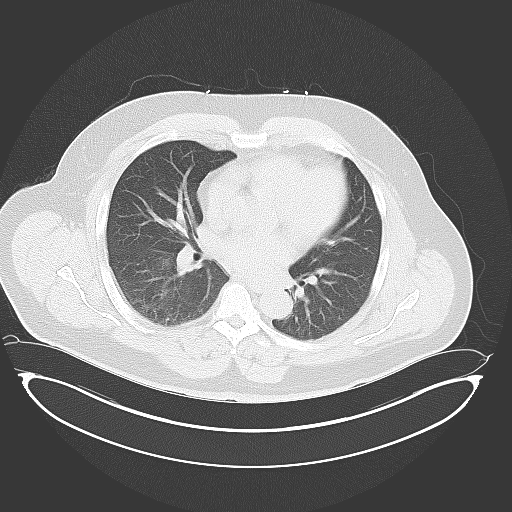
[im 22/23  soft-tissue]
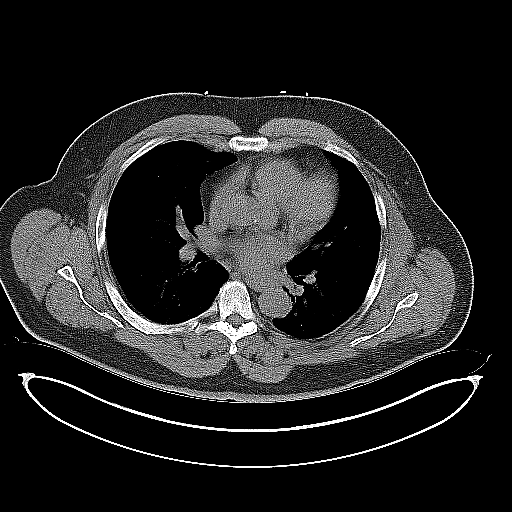
[im 22/23  lung]
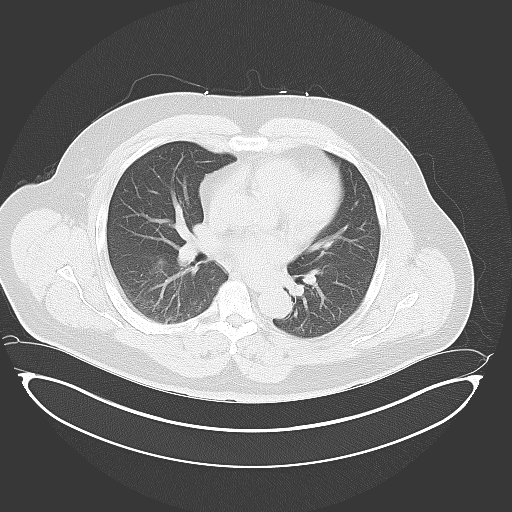

[15 of 23 positions shown; findings below may reference images not displayed]

FINDINGS: There is mildly increased density within the fat at the gallbladder
bed consistent with postsurgical change. There is no free fluid or
free air nor evidence of an abnormal fluid collection. The liver
exhibits no focal mass nor ductal dilation. There is a 2 mm diameter
calcification which lies in the region of the ampulla of Vater on
image 40 of series 2 that is worrisome for a retained common bile
duct stone. There is no common bile ductal dilation.

The pancreas, spleen, partially distended stomach, adrenal glands,
and kidneys are normal. The small and large bowel exhibit no
evidence of ileus nor obstruction. The urinary bladder, prostate
gland, and seminal vesicles are normal. There is no inguinal nor
significant umbilical hernia. There is no free fluid in the abdomen
or pelvis. There is no evidence of an abdominal wall abscess or
hematoma.

The lumbar spine and bony pelvis exhibit no acute abnormalities.
There are degenerative disc changes at L5-S1. The lung bases exhibit
minimal compressive atelectasis.
IMPRESSION: 1. The findings are worrisome for a retained 2 mm diameter calcified
common bile duct stone. It appears to lie at the level of the
ampulla of Vater. No common bile ductal dilation is demonstrated.
There is no evidence of acute pancreatitis.
2. There is no acute abnormality within the gallbladder bed. Minimal
postsurgical changes are present.
3. There is no acute abnormality elsewhere within the abdomen or
pelvis.
4. These results were called by telephone at the time of
interpretation on 05/24/2014 at [DATE] to Anuchit Aimie, who verbally
acknowledged these results and will give them to Dr.Dalvio .

## 2015-02-07 ENCOUNTER — Telehealth: Payer: Self-pay | Admitting: Internal Medicine

## 2015-02-07 NOTE — Telephone Encounter (Signed)
Left message for pt to call back  °

## 2015-02-07 NOTE — Telephone Encounter (Signed)
Pt states he took the cipro and flagyl for 3 days but stopped it yesterday. States he was aching all over. States the past couple of times he took augmentin and did well. Please advise.

## 2015-02-08 ENCOUNTER — Telehealth: Payer: Self-pay | Admitting: Gastroenterology

## 2015-02-08 MED ORDER — AMOXICILLIN-POT CLAVULANATE 875-125 MG PO TABS: 1.0000 | ORAL_TABLET | Freq: Two times a day (BID) | ORAL | Status: DC

## 2015-02-08 NOTE — Telephone Encounter (Signed)
On call note. See recent phone notes. Pt was expecting Augmentin prescription for diverticulitis which was not sent in. He was advised to stop Cipro and Flagyl.

## 2015-02-09 NOTE — Telephone Encounter (Signed)
Routing to [employer], see below

## 2015-02-09 NOTE — Telephone Encounter (Signed)
Ok for augmentin 875 mg BID x 7 days for diverticulitis

## 2015-02-10 NOTE — Telephone Encounter (Signed)
Spoke with pt and he spoke with the on call md over the weekend and received Augmentin for 5 days. Pt will call back if he still has problems after taking these.

## 2015-03-11 ENCOUNTER — Ambulatory Visit
Admission: RE | Admit: 2015-03-11 | Payer: No Typology Code available for payment source | Source: Ambulatory Visit | Admitting: Gastroenterology

## 2015-03-11 ENCOUNTER — Encounter: Admission: RE | Payer: Self-pay | Source: Ambulatory Visit

## 2015-03-11 SURGERY — COLONOSCOPY WITH PROPOFOL
Anesthesia: General

## 2015-05-11 ENCOUNTER — Other Ambulatory Visit: Payer: Self-pay | Admitting: Gastroenterology

## 2015-05-26 ENCOUNTER — Telehealth: Payer: Self-pay | Admitting: *Deleted

## 2015-05-26 MED ORDER — AMOXICILLIN-POT CLAVULANATE 875-125 MG PO TABS: 1.0000 | ORAL_TABLET | Freq: Two times a day (BID) | ORAL | Status: DC

## 2015-05-26 NOTE — Telephone Encounter (Signed)
Per Dr Hilarie Fredrickson, patient should be given augmentin 875 mg twice daily x 10 days to take with him on his cruise in the case of a diverticulitis flare. Rx sent to pharmacy.

## 2015-06-16 ENCOUNTER — Encounter: Payer: Self-pay | Admitting: Nurse Practitioner

## 2015-06-25 ENCOUNTER — Encounter: Payer: Self-pay | Admitting: Nurse Practitioner

## 2015-06-25 ENCOUNTER — Ambulatory Visit (INDEPENDENT_AMBULATORY_CARE_PROVIDER_SITE_OTHER): Payer: 59 | Admitting: Nurse Practitioner

## 2015-06-25 VITALS — BP 126/74 | HR 57 | Ht 67.0 in | Wt 233.2 lb

## 2015-06-25 DIAGNOSIS — E119 Type 2 diabetes mellitus without complications: Secondary | ICD-10-CM

## 2015-06-25 DIAGNOSIS — I10 Essential (primary) hypertension: Secondary | ICD-10-CM | POA: Diagnosis not present

## 2015-06-25 DIAGNOSIS — I4891 Unspecified atrial fibrillation: Secondary | ICD-10-CM

## 2015-06-25 NOTE — Progress Notes (Signed)
Electrophysiology Office Note Date: 06/25/2015  ID:  [name], DOB 07/14/1961, MRN [id number]  PCP: Jorge Pink, MD Electrophysiologist: Jorge Peterson  CC: follow up holter monitor  Jorge Peterson is a 54 y.o. male seen today for Dr Jorge Peterson.  He underwent PVI in 2012 and was last seen in clinic by Dr Jorge Peterson in 2014 at which time he had intermittent palpitations and pill in the pocket flecainide was prescribed. He does not remember taking this medication, but it is not currently on his list.  He has recently developed recurrent symptoms similar to AF in the past.  He has had increased fatigue, palpitations, occasional LE edema and non-productive cough.  His symptoms seem to be more prevalent at night.  He does not drink ETOH and has had a previous negative sleep study. Holter monitor placed by PCP did not demonstrate AF, but he did have occasional blocked PAC's.  He remains active exercising at the gym without chest pain or shortness of breath with exertion.   He denies chest pain, PND, orthopnea, nausea, vomiting, dizziness, syncope, edema, weight gain, or early satiety.  Past Medical History  Diagnosis Date  . Atrial fibrillation     s/p afib ablation 7/12  . Hyperlipidemia   . Bradycardia     asymptomatic  . Diverticulitis   . Hypertension   . Shortness of breath   . GERD (gastroesophageal reflux disease)   . Headache(784.0)   . Family history of anesthesia complication     father gets pneumonia after "   Past Surgical History  Procedure Laterality Date  . Shoulder surgery      x 2  . Back surgery    . Atrial ablation surgery  03/24/11    PVI by JA  . Fistula surgery    . Tonsillectomy    . Bronchoscopy  01/23/2013     biopsy  . Video bronchoscopy with endobronchial ultrasound N/A 01/23/2013    Procedure: VIDEO BRONCHOSCOPY WITH ENDOBRONCHIAL ULTRASOUND;  Surgeon: Ivin Poot, MD;  Location: [facility];  Service: Thoracic;  Laterality: N/A;  . Mediastinoscopy N/A  01/23/2013    Procedure: MEDIASTINOSCOPY;  Surgeon: Ivin Poot, MD;  Location: [facility];  Service: Thoracic;  Laterality: N/A;  . Cholecystectomy  05/02/2014    LAP CHOLE  . Cholecystectomy N/A 05/02/2014    Procedure: LAPAROSCOPIC CHOLECYSTECTOMY WITH INTRAOPERATIVE CHOLANGIOGRAM;  Surgeon: Erroll Luna, MD;  Location: [facility];  Service: General;  Laterality: N/A;  . Ercp N/A 05/25/2014    Procedure: ENDOSCOPIC RETROGRADE CHOLANGIOPANCREATOGRAPHY (ERCP);  Surgeon: [name], MD;  Location: [facility];  Service: Endoscopy;  Laterality: N/A;    Current Outpatient Prescriptions  Medication Sig Dispense Refill  . amoxicillin-clavulanate (AUGMENTIN) 875-125 MG per tablet Take 1 tablet by mouth 2 (two) times daily. 20 tablet 0  . aspirin EC 81 MG tablet Take 81 mg by mouth daily.    . Biotin 1000 MCG tablet Take 1,000 mcg by mouth daily.    . Ca Carbonate-Mag Hydroxide (ROLAIDS PO) Take by mouth as needed (indigestion or reflux.).     [name] ciprofloxacin (CIPRO) 500 MG tablet Take one po BID x 10 days. 20 tablet 0  . Coenzyme Q10 (COQ10) 150 MG CAPS Take 150 mg by mouth daily.     . fish oil-omega-3 fatty acids 1000 MG capsule Take 1 g by mouth daily.     . fluticasone (FLONASE) 50 MCG/ACT nasal spray Place 1 spray into the nose daily as  needed for allergies.     . folic acid (FOLVITE) 540 MCG tablet Take 400 mcg by mouth daily.      . hydrochlorothiazide (MICROZIDE) 12.5 MG capsule Take 12.5 mg by mouth daily.    [name] losartan (COZAAR) 100 MG tablet Take 100 mg by mouth daily.    . metroNIDAZOLE (FLAGYL) 500 MG tablet Take one po TID x 10 days 30 tablet 0  . Multiple Vitamin (MULTIVITAMIN) tablet Take 1 tablet by mouth daily.     [name] omeprazole (PRILOSEC) 40 MG capsule TAKE 1 CAPSULE (40 MG TOTAL) BY MOUTH DAILY. 90 capsule 0  . Probiotic Product (PROBIOTIC FORMULA PO) Take 1 capsule by mouth daily.    . Simethicone (GAS-X PO) Take by mouth as needed (gas.).     [name] simvastatin (ZOCOR) 40 MG  tablet Take 40 mg by mouth at bedtime.     . traZODone (DESYREL) 50 MG tablet Take 50 mg by mouth at bedtime as needed for sleep.     No current facility-administered medications for this visit.    Allergies:   Oxycodone-acetaminophen   Social History: Social History   Social History  . Marital Status: Married    Spouse Name: N/A  . Number of Children: 3  . Years of Education: N/A   Occupational History  . Minister/ Assoc. Pastor    Social History Main Topics  . Smoking status: Never Smoker   . Smokeless tobacco: Never Used  . Alcohol Use: No  . Drug Use: No  . Sexual Activity: Yes   Other Topics Concern  . Not on file   Social History Narrative   Lives in [place].  3 children are all healthy.   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   No siblings with CAD.    Family History: Family History  Problem Relation Age of Onset  . Diabetes Father   . Heart disease Father     CABG at 63    Review of Systems: All other systems reviewed and are otherwise negative except as noted above.   Physical Exam: VS:  There were no vitals taken for this visit. , BMI There is no weight on file to calculate BMI. Wt Readings from Last 3 Encounters:  05/25/14 216 lb (97.977 kg)  05/24/14 214 lb 4.8 oz (97.206 kg)  05/09/14 223 lb (101.152 kg)    GEN- The patient is obese appearing, alert and oriented x 3 today.   HEENT: normocephalic, atraumatic; sclera clear, conjunctiva Peterson; hearing intact; oropharynx clear; neck supple  Lungs- Clear to ausculation bilaterally, normal work of breathing.  No wheezes, rales, rhonchi Heart- Regular rate and rhythm  GI- soft, non-tender, non-distended, bowel sounds present  Extremities- no clubbing, cyanosis, or edema; DP/PT/radial pulses 2+ bilaterally MS- no significant deformity or atrophy Skin- warm and dry, no rash or lesion  Psych- euthymic mood, full affect Neuro- strength and sensation are intact   EKG:  EKG is  ordered today. The ekg ordered today shows sinus rhythm, rate 57, 1st degree AV  Block, blocked PAC  Recent Labs: No results found for requested labs within last 365 days.    Other studies Reviewed: Additional studies/ records that were reviewed today include: Dr Jackalyn Lombard notes, recent holter monitor  Assessment and Plan: 1.  Persistent atrial fibrillation S/p PVI 2012 with Dr Jorge Peterson He is having recurrent symptoms that remind him of previous atrial fibrillation although we do not have this documented Discussed implantable loop recorder with patient to  better quantify AF burden and guide therapy.  Risks, benefits reviewed with patient who wishes to proceed. Will schedule with Dr Jorge Peterson at the next available time. His CHADS2VASC is now 2 (HTN, DM).  If he has documented recurrent AF, would recommend anticoagulation and discontinuation of ASA - discussed with patient today Will update echo  2.  HTN Stable No change required today  3.  Diabetes Newly diagnosed by PCP and not currently on meds A1C 7.2  Continued weight loss encouraged   Current medicines are reviewed at length with the patient today.   The patient does not have concerns regarding his medicines.  The following changes were made today:  none  Labs/ tests ordered today include: none    Disposition:   Follow up with Dr Jorge Peterson next month as scheduled    Signed, Jorge Marshall, NP 06/25/2015 2:18 PM   [facility] [address] [place] [address] [phone] (office) [phone] (fax)

## 2015-06-25 NOTE — Patient Instructions (Signed)
Medication Instructions:   Your physician recommends that you continue on your current medications as directed. Please refer to the Current Medication list given to you today.   If you need a refill on your cardiac medications before your next appointment, please call your pharmacy.  Labwork: NONE ORDER TODAY    Testing/Procedures:  Your physician has requested that you have an [facility]  Echocardiography is a painless test that uses sound waves to create images of your heart. It provides your doctor with information about the size and shape of your heart and how well your heart's chambers and valves are working. This procedure takes approximately one hour. There are no restrictions for this procedure.    Implantable Device Instructions  You are scheduled for:                                   ___X__ Implantable Loop Recorder                        on  06/27/15   with Dr. Rayann Heman.  1.   Please arrive at the [facility] at  5:30 AM  on                   the day of your  Procedure. @ 7 AM   2. Do not eat or drink  AFTER MIDNIGHT the night before your procedure  3.  NO LABS NEEDED    4.  DO NOT TAKE THESE MEDICATIONS  PRIOR TO THE MORNING your   procedure:  X             *YOU MAY TAKE THE REMAINING OF YOUR  OTHER MEDICATIONS WITH A SIP OF WATER  5.  Plan for an overnight stay.  Bring your insurance cards and a list of you medications.  6.  Wash your chest and neck with antibacterial soap (any brand) the evening before  and the morning of your procedure.  Rinse well.  Follow-Up:  WITH [facility] IN NOVEMEBR AS SCHEDULED   Any Other Special Instructions Will Be Listed Below (If Applicable).            I

## 2015-06-27 ENCOUNTER — Ambulatory Visit (HOSPITAL_COMMUNITY)
Admission: RE | Admit: 2015-06-27 | Discharge: 2015-06-27 | Disposition: A | Discharge status: Home / Self Care | Payer: 59 | Source: Ambulatory Visit | Attending: Internal Medicine | Admitting: Internal Medicine

## 2015-06-27 ENCOUNTER — Encounter (HOSPITAL_COMMUNITY)
Admission: RE | Disposition: A | Discharge status: Home / Self Care | Payer: Self-pay | Source: Ambulatory Visit | Attending: Internal Medicine

## 2015-06-27 ENCOUNTER — Encounter (HOSPITAL_COMMUNITY): Payer: Self-pay | Admitting: Internal Medicine

## 2015-06-27 DIAGNOSIS — I4891 Unspecified atrial fibrillation: Secondary | ICD-10-CM | POA: Diagnosis present

## 2015-06-27 DIAGNOSIS — Z8249 Family history of ischemic heart disease and other diseases of the circulatory system: Secondary | ICD-10-CM | POA: Insufficient documentation

## 2015-06-27 DIAGNOSIS — K219 Gastro-esophageal reflux disease without esophagitis: Secondary | ICD-10-CM | POA: Insufficient documentation

## 2015-06-27 DIAGNOSIS — Z7982 Long term (current) use of aspirin: Secondary | ICD-10-CM | POA: Diagnosis not present

## 2015-06-27 DIAGNOSIS — I440 Atrioventricular block, first degree: Secondary | ICD-10-CM | POA: Diagnosis not present

## 2015-06-27 DIAGNOSIS — I481 Persistent atrial fibrillation: Secondary | ICD-10-CM | POA: Diagnosis not present

## 2015-06-27 DIAGNOSIS — I10 Essential (primary) hypertension: Secondary | ICD-10-CM | POA: Diagnosis not present

## 2015-06-27 DIAGNOSIS — E119 Type 2 diabetes mellitus without complications: Secondary | ICD-10-CM | POA: Insufficient documentation

## 2015-06-27 DIAGNOSIS — R002 Palpitations: Secondary | ICD-10-CM | POA: Diagnosis not present

## 2015-06-27 DIAGNOSIS — E785 Hyperlipidemia, unspecified: Secondary | ICD-10-CM | POA: Diagnosis not present

## 2015-06-27 HISTORY — PX: EP IMPLANTABLE DEVICE: SHX172B

## 2015-06-27 LAB — GLUCOSE, CAPILLARY: GLUCOSE-CAPILLARY: 131 mg/dL — AB (ref 65–99)

## 2015-06-27 SURGERY — LOOP RECORDER INSERTION
Anesthesia: LOCAL

## 2015-06-27 MED ORDER — LIDOCAINE-EPINEPHRINE 1 %-1:100000 IJ SOLN
INTRAMUSCULAR | Status: DC | PRN
  Administered 2015-06-27: 15 mL

## 2015-06-27 MED ORDER — LIDOCAINE-EPINEPHRINE 1 %-1:100000 IJ SOLN
INTRAMUSCULAR | Status: AC
  Filled 2015-06-27: qty 1

## 2015-06-27 SURGICAL SUPPLY — 2 items
LOOP REVEAL LINQSYS (Prosthesis & Implant Heart) ×1 IMPLANT
PACK LOOP INSERTION (CUSTOM PROCEDURE TRAY) ×2 IMPLANT

## 2015-06-27 NOTE — H&P (View-Only) (Signed)
Electrophysiology Office Note Date: 06/25/2015  ID:  Jorge Peterson, DOB 1961/04/08, MRN [id number]  PCP: [name], MD Electrophysiologist: Rayann Heman  CC: follow up holter monitor  Jorge Peterson is a 54 y.o. male seen today for Dr Rayann Heman.  He underwent PVI in 2012 and was last seen in clinic by Dr Rayann Heman in 2014 at which time he had intermittent palpitations and pill in the pocket flecainide was prescribed. He does not remember taking this medication, but it is not currently on his list.  He has recently developed recurrent symptoms similar to AF in the past.  He has had increased fatigue, palpitations, occasional LE edema and non-productive cough.  His symptoms seem to be more prevalent at night.  He does not drink ETOH and has had a previous negative sleep study. Holter monitor placed by PCP did not demonstrate AF, but he did have occasional blocked PAC's.  He remains active exercising at the gym without chest pain or shortness of breath with exertion.   He denies chest pain, PND, orthopnea, nausea, vomiting, dizziness, syncope, edema, weight gain, or early satiety.  Past Medical History  Diagnosis Date  . Atrial fibrillation     s/p afib ablation 7/12  . Hyperlipidemia   . Bradycardia     asymptomatic  . Diverticulitis   . Hypertension   . Shortness of breath   . GERD (gastroesophageal reflux disease)   . Headache(784.0)   . Family history of anesthesia complication     father gets pneumonia after "   Past Surgical History  Procedure Laterality Date  . Shoulder surgery      x 2  . Back surgery    . Atrial ablation surgery  03/24/11    PVI by JA  . Fistula surgery    . Tonsillectomy    . Bronchoscopy  01/23/2013     biopsy  . Video bronchoscopy with endobronchial ultrasound N/A 01/23/2013    Procedure: VIDEO BRONCHOSCOPY WITH ENDOBRONCHIAL ULTRASOUND;  Surgeon: Ivin Poot, MD;  Location: [facility];  Service: Thoracic;  Laterality: N/A;  . Mediastinoscopy N/A  01/23/2013    Procedure: MEDIASTINOSCOPY;  Surgeon: Ivin Poot, MD;  Location: [facility];  Service: Thoracic;  Laterality: N/A;  . Cholecystectomy  05/02/2014    LAP CHOLE  . Cholecystectomy N/A 05/02/2014    Procedure: LAPAROSCOPIC CHOLECYSTECTOMY WITH INTRAOPERATIVE CHOLANGIOGRAM;  Surgeon: Erroll Luna, MD;  Location: [facility];  Service: General;  Laterality: N/A;  . Ercp N/A 05/25/2014    Procedure: ENDOSCOPIC RETROGRADE CHOLANGIOPANCREATOGRAPHY (ERCP);  Surgeon: [name], MD;  Location: [facility];  Service: Endoscopy;  Laterality: N/A;    Current Outpatient Prescriptions  Medication Sig Dispense Refill  . amoxicillin-clavulanate (AUGMENTIN) 875-125 MG per tablet Take 1 tablet by mouth 2 (two) times daily. 20 tablet 0  . aspirin EC 81 MG tablet Take 81 mg by mouth daily.    . Biotin 1000 MCG tablet Take 1,000 mcg by mouth daily.    . Ca Carbonate-Mag Hydroxide (ROLAIDS PO) Take by mouth as needed (indigestion or reflux.).     [name] ciprofloxacin (CIPRO) 500 MG tablet Take one po BID x 10 days. 20 tablet 0  . Coenzyme Q10 (COQ10) 150 MG CAPS Take 150 mg by mouth daily.     . fish oil-omega-3 fatty acids 1000 MG capsule Take 1 g by mouth daily.     . fluticasone (FLONASE) 50 MCG/ACT nasal spray Place 1 spray into the nose daily as  needed for allergies.     . folic acid (FOLVITE) 528 MCG tablet Take 400 mcg by mouth daily.      . hydrochlorothiazide (MICROZIDE) 12.5 MG capsule Take 12.5 mg by mouth daily.    [name] losartan (COZAAR) 100 MG tablet Take 100 mg by mouth daily.    . metroNIDAZOLE (FLAGYL) 500 MG tablet Take one po TID x 10 days 30 tablet 0  . Multiple Vitamin (MULTIVITAMIN) tablet Take 1 tablet by mouth daily.     [name] omeprazole (PRILOSEC) 40 MG capsule TAKE 1 CAPSULE (40 MG TOTAL) BY MOUTH DAILY. 90 capsule 0  . Probiotic Product (PROBIOTIC FORMULA PO) Take 1 capsule by mouth daily.    . Simethicone (GAS-X PO) Take by mouth as needed (gas.).     [name] simvastatin (ZOCOR) 40 MG  tablet Take 40 mg by mouth at bedtime.     . traZODone (DESYREL) 50 MG tablet Take 50 mg by mouth at bedtime as needed for sleep.     No current facility-administered medications for this visit.    Allergies:   Oxycodone-acetaminophen   Social History: Social History   Social History  . Marital Status: Married    Spouse Name: N/A  . Number of Children: 3  . Years of Education: N/A   Occupational History  . Minister/ Assoc. Pastor    Social History Main Topics  . Smoking status: Never Smoker   . Smokeless tobacco: Never Used  . Alcohol Use: No  . Drug Use: No  . Sexual Activity: Yes   Other Topics Concern  . Not on file   Social History Narrative   Lives in [place].  3 children are all healthy.   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   No siblings with CAD.    Family History: Family History  Problem Relation Age of Onset  . Diabetes Father   . Heart disease Father     CABG at 64    Review of Systems: All other systems reviewed and are otherwise negative except as noted above.   Physical Exam: VS:  There were no vitals taken for this visit. , BMI There is no weight on file to calculate BMI. Wt Readings from Last 3 Encounters:  05/25/14 216 lb (97.977 kg)  05/24/14 214 lb 4.8 oz (97.206 kg)  05/09/14 223 lb (101.152 kg)    GEN- The patient is obese appearing, alert and oriented x 3 today.   HEENT: normocephalic, atraumatic; sclera clear, conjunctiva pink; hearing intact; oropharynx clear; neck supple  Lungs- Clear to ausculation bilaterally, normal work of breathing.  No wheezes, rales, rhonchi Heart- Regular rate and rhythm  GI- soft, non-tender, non-distended, bowel sounds present  Extremities- no clubbing, cyanosis, or edema; DP/PT/radial pulses 2+ bilaterally MS- no significant deformity or atrophy Skin- warm and dry, no rash or lesion  Psych- euthymic mood, full affect Neuro- strength and sensation are intact   EKG:  EKG is  ordered today. The ekg ordered today shows sinus rhythm, rate 57, 1st degree AV  Block, blocked PAC  Recent Labs: No results found for requested labs within last 365 days.    Other studies Reviewed: Additional studies/ records that were reviewed today include: Dr Jackalyn Lombard notes, recent holter monitor  Assessment and Plan: 1.  Persistent atrial fibrillation S/p PVI 2012 with Dr Rayann Heman He is having recurrent symptoms that remind him of previous atrial fibrillation although we do not have this documented Discussed implantable loop recorder with patient to  better quantify AF burden and guide therapy.  Risks, benefits reviewed with patient who wishes to proceed. Will schedule with Dr Rayann Heman at the next available time. His CHADS2VASC is now 2 (HTN, DM).  If he has documented recurrent AF, would recommend anticoagulation and discontinuation of ASA - discussed with patient today Will update echo  2.  HTN Stable No change required today  3.  Diabetes Newly diagnosed by PCP and not currently on meds A1C 7.2  Continued weight loss encouraged   Current medicines are reviewed at length with the patient today.   The patient does not have concerns regarding his medicines.  The following changes were made today:  none  Labs/ tests ordered today include: none    Disposition:   Follow up with Dr Rayann Heman next month as scheduled    Signed, Chanetta Marshall, NP 06/25/2015 2:18 PM   [facility] [address] [place] [address] [phone] (office) [phone] (fax)

## 2015-06-27 NOTE — Interval H&P Note (Signed)
History and Physical Interval Note:  06/27/2015 7:28 AM  The patient has had recent onset of tachpalpitations.  He has a h/o afib and is s/p ablation.  The cause for his palpitations are not clear.  They are infrequent and unlikely to be found on 30 day monitoring.  There is concern for recurrence of AF. I therefore agree that ILR implant for af management post ablation is prudent.  Jorge Peterson  has presented today for surgery, with the diagnosis of palpitations  The various methods of treatment have been discussed with the patient and family. After consideration of risks, benefits and other options for treatment, the patient has consented to  Procedure(s): Loop Recorder Insertion (N/A) as a surgical intervention .  The patient's history has been reviewed, patient examined, no change in status, stable for surgery.  I have reviewed the patient's chart and labs.  Questions were answered to the patient's satisfaction.     Jorge Peterson

## 2015-06-29 ENCOUNTER — Encounter: Payer: Self-pay | Admitting: Internal Medicine

## 2015-06-30 ENCOUNTER — Telehealth: Payer: Self-pay | Admitting: *Deleted

## 2015-06-30 MED ORDER — RIVAROXABAN 20 MG PO TABS: 20.0000 mg | ORAL_TABLET | Freq: Every day | ORAL | Status: DC

## 2015-06-30 NOTE — Telephone Encounter (Signed)
Spoke with patient and advised that the [place] office is holding a trial offer card for him at the front desk.  Explained that he should begin taking the Xarelto this evening with dinner if possible, and that he should discontinue taking his aspirin.   Confirmed follow-up appointment with Dr. Rayann Heman on 07/14/15.  Patient voices understanding of instructions and denies additional questions or concerns at this time.

## 2015-06-30 NOTE — Telephone Encounter (Signed)
Called patient to explain that atrial fibrillation was seen on his loop recorder transmission.  Per Chanetta Marshall, NP, orders placed for Xarelto 20mg  daily and prescription electronically sent to patient's pharmacy.  Patient was concerned about cost of prescription.  Advised that we have a trial offer card at the [facility]. office, but that I was not sure if the [place] office would have any samples or trial offer cards.  Explained to patient that I would call the [facility] office to find out, then call him back.  Patient voices understanding and appreciation.

## 2015-07-02 ENCOUNTER — Ambulatory Visit (HOSPITAL_COMMUNITY): Payer: 59 | Attending: Cardiology

## 2015-07-02 ENCOUNTER — Other Ambulatory Visit: Payer: Self-pay

## 2015-07-02 DIAGNOSIS — I10 Essential (primary) hypertension: Secondary | ICD-10-CM | POA: Diagnosis not present

## 2015-07-02 DIAGNOSIS — I071 Rheumatic tricuspid insufficiency: Secondary | ICD-10-CM | POA: Diagnosis not present

## 2015-07-02 DIAGNOSIS — I4891 Unspecified atrial fibrillation: Secondary | ICD-10-CM | POA: Insufficient documentation

## 2015-07-02 DIAGNOSIS — I5189 Other ill-defined heart diseases: Secondary | ICD-10-CM | POA: Diagnosis not present

## 2015-07-07 ENCOUNTER — Telehealth: Payer: Self-pay | Admitting: *Deleted

## 2015-07-07 NOTE — Telephone Encounter (Signed)
-----   Message from Patsey Berthold, NP sent at 07/03/2015  8:30 AM EDT ----- Please notify patient of results.  Normal heart pumping function, trivial amount of leakage through one heart valve (nothing to worry about).  Keep follow up with Dr Rayann Heman as scheduled.  Thank you!

## 2015-07-14 ENCOUNTER — Encounter: Payer: Self-pay | Admitting: Internal Medicine

## 2015-07-14 ENCOUNTER — Ambulatory Visit (INDEPENDENT_AMBULATORY_CARE_PROVIDER_SITE_OTHER): Payer: 59 | Admitting: Internal Medicine

## 2015-07-14 VITALS — BP 124/72 | HR 76 | Ht 67.0 in | Wt 236.4 lb

## 2015-07-14 DIAGNOSIS — E663 Overweight: Secondary | ICD-10-CM | POA: Diagnosis not present

## 2015-07-14 DIAGNOSIS — I480 Paroxysmal atrial fibrillation: Secondary | ICD-10-CM

## 2015-07-14 NOTE — Patient Instructions (Addendum)
Medication Instructions:  Your physician recommends that you continue on your current medications as directed. Please refer to the Current Medication list given to you today.   Labwork: Your physician recommends that you return for lab work on 08/20/15 at [place] at the office  You do not have to be fasting    Testing/Procedures: Your physician has recommended that you have an ablation. Catheter ablation is a medical procedure used to treat some cardiac arrhythmias (irregular heartbeats). During catheter ablation, a long, thin, flexible tube is put into a blood vessel in your groin (upper thigh), or neck. This tube is called an ablation catheter. It is then guided to your heart through the blood vessel. Radio frequency waves destroy small areas of heart tissue where abnormal heartbeats may cause an arrhythmia to start. Please see the instruction sheet given to you today.  The ablation is scheduled for 09/02/15.  You will have to be at the [place] [facility] at 5:30am  Do not eat or drink after midnight and do not take any medications the morning of procedure  Your physician has requested that you have cardiac CT. Cardiac computed tomography (CT) is a painless test that uses an x-ray machine to take clear, detailed pictures of your heart. For further information please visit [employer]. Please follow instruction sheet as given.---this needs to be done the week of 08/25/15        Follow-Up: Your physician recommends that you schedule a follow-up appointment in: 4 weeks from 09/02/15 with Dr Rayann Heman and 3 months from 09/02/15 with Roderic Palau, NP    Any Other Special Instructions Will Be Listed Below (If Applicable).     If you need a refill on your cardiac medications before your next appointment, please call your pharmacy.

## 2015-07-14 NOTE — Progress Notes (Signed)
Electrophysiology Office Note   Date:  07/14/2015   ID:  Jorge Peterson, DOB 05/13/1961, MRN [id number]  PCP:  [name], MD  Primary Electrophysiologist: [name], MD    Chief Complaint  Patient presents with  . Atrial Fibrillation     History of Present Illness: Jorge Peterson is a 54 y.o. male who presents today for electrophysiology evaluation.   The patient is s/p AF ablation by me in 2012.  He has done very well. Unfortunately, he has begun having afib of increasing frequency and duration.  Recent ILR has been placed and documents Af burden of 16.5% .  He has palpitations and fatigue. Today, he denies symptoms of chest pain, shortness of breath, orthopnea, PND, lower extremity edema, claudication, dizziness, presyncope, syncope, bleeding, or neurologic sequela. The patient is tolerating medications without difficulties and is otherwise without complaint today.    Past Medical History  Diagnosis Date  . Atrial fibrillation (Jorge Peterson)     s/p afib ablation 7/12  . Hyperlipidemia   . Bradycardia     asymptomatic  . Diverticulitis   . Hypertension   . Shortness of breath   . GERD (gastroesophageal reflux disease)   . Headache(784.0)   . Family history of anesthesia complication     father gets pneumonia after "  . Diabetes [name])    Past Surgical History  Procedure Laterality Date  . Shoulder surgery      x 2  . Back surgery    . Atrial ablation surgery  03/24/11    PVI by JA  . Fistula surgery    . Tonsillectomy    . Bronchoscopy  01/23/2013     biopsy  . Video bronchoscopy with endobronchial ultrasound N/A 01/23/2013    Procedure: VIDEO BRONCHOSCOPY WITH ENDOBRONCHIAL ULTRASOUND;  Surgeon: Ivin Poot, MD;  Location: [place];  Service: Thoracic;  Laterality: N/A;  . Mediastinoscopy N/A 01/23/2013    Procedure: MEDIASTINOSCOPY;  Surgeon: Ivin Poot, MD;  Location: [place];  Service: Thoracic;  Laterality: N/A;  . Cholecystectomy  05/02/2014    LAP  CHOLE  . Cholecystectomy N/A 05/02/2014    Procedure: LAPAROSCOPIC CHOLECYSTECTOMY WITH INTRAOPERATIVE CHOLANGIOGRAM;  Surgeon: Erroll Luna, MD;  Location: [place];  Service: General;  Laterality: N/A;  . Ercp N/A 05/25/2014    Procedure: ENDOSCOPIC RETROGRADE CHOLANGIOPANCREATOGRAPHY (ERCP);  Surgeon: [name], MD;  Location: [place];  Service: Endoscopy;  Laterality: N/A;  . Ep implantable device N/A 06/27/2015    Procedure: Loop Recorder Insertion;  Surgeon: [name], MD;  Location: [place] [facility];  Service: Cardiovascular;  Laterality: N/A;     Current Outpatient Prescriptions  Medication Sig Dispense Refill  . Biotin 1000 MCG tablet Take 1,000 mcg by mouth daily.    . Coenzyme Q10 (COQ10) 150 MG CAPS Take 150 mg by mouth daily.     . fish oil-omega-3 fatty acids 1000 MG capsule Take 1-2 g by mouth 2 (two) times daily. 2 Capsules in the morning. 1 capsule in the evening    . fluticasone (FLONASE) 50 MCG/ACT nasal spray Place 1 spray into the nose daily as needed for allergies.     . folic acid (FOLVITE) A999333 MCG tablet Take 400 mcg by mouth daily.      . hydrochlorothiazide (MICROZIDE) 12.5 MG capsule Take 12.5 mg by mouth daily.    [name] losartan (COZAAR) 100 MG tablet Take 100 mg by mouth daily.    . Multiple Vitamin (MULTIVITAMIN) tablet  Take 1 tablet by mouth daily.     [name] omeprazole (PRILOSEC) 40 MG capsule Take 1 capsule by mouth daily.  0  . Probiotic Product (PROBIOTIC FORMULA PO) Take 1 capsule by mouth daily.    . rivaroxaban (XARELTO) 20 MG TABS tablet Take 1 tablet (20 mg total) by mouth daily with supper. 30 tablet 11  . simvastatin (ZOCOR) 40 MG tablet Take 40 mg by mouth at bedtime.      No current facility-administered medications for this visit.    Allergies:   Oxycodone-acetaminophen   Social History:  The patient  reports that he has never smoked. He has never used smokeless tobacco. He reports that he does not drink alcohol or use illicit drugs.    Family History:  The patient's  family history includes Diabetes in his father; Heart disease in his father.    ROS:  Please see the history of present illness.   All other systems are reviewed and negative.    PHYSICAL EXAM: VS:  BP 124/72 mmHg  Pulse 76  Ht 5\' 7"  (1.702 m)  Wt 236 lb 6.4 oz (107.23 kg)  BMI 37.02 kg/m2 , BMI Body mass index is 37.02 kg/(m^2). GEN: Well nourished, well developed, in no acute distress HEENT: normal Neck: no JVD, carotid bruits, or masses Cardiac: iRRR; no murmurs, rubs, or gallops,no edema  Respiratory:  clear to auscultation bilaterally, normal work of breathing GI: soft, nontender, nondistended, + BS MS: no deformity or atrophy Skin: warm and dry  Neuro:  Strength and sensation are intact Psych: euthymic mood, full affect  ILR interrogation is reviewed (see paceart)   Recent Labs: No results found for requested labs within last 365 days.    Lipid Panel     Component Value Date/Time   CHOL 128 11/27/2012 0456   TRIG 123 11/27/2012 0456   HDL 34* 11/27/2012 0456   CHOLHDL 3.8 11/27/2012 0456   VLDL 25 11/27/2012 0456   LDLCALC 69 11/27/2012 0456     Wt Readings from Last 3 Encounters:  07/14/15 236 lb 6.4 oz (107.23 kg)  06/27/15 228 lb (103.42 kg)  06/25/15 233 lb 3.2 oz (105.779 kg)      Other studies Reviewed: Additional studies/ records that were reviewed today include: Research officer, trade union notes ,echo Review of the above records today demonstrates: preserved EF, no significant valvular disease   ASSESSMENT AND PLAN:  1.  Paroxysmal atrial fibrillation The patient has done well for several years post ablation.  Unfortunately, he has developed recurrence.  Therapeutic strategies for afib including medicine and ablation were discussed in detail with the patient today. Risk, benefits, and alternatives to EP study and radiofrequency ablation for afib were also discussed in detail today. These risks include but are not limited to  stroke, bleeding, vascular damage, tamponade, perforation, damage to the esophagus, lungs, and other structures, pulmonary vein stenosis, worsening renal function, and death. The patient understands these risk and wishes to proceed.  We will therefore proceed with catheter ablation at the next available time.  Cardiac CT is ordered to evaluate LA appendage and PVs prior to ablation chads2vasc score is 1.  The importance of long term anticoagualtion was discussed with the patient  2. Obesity Weight loss encouraged  Current medicines are reviewed at length with the patient today.   The patient does not have concerns regarding his medicines.  The following changes were made today:  none   Signed, [name], MD  07/14/2015 11:41 AM  [place] [address] [phone] (office) [phone] (fax)

## 2015-07-28 ENCOUNTER — Ambulatory Visit (INDEPENDENT_AMBULATORY_CARE_PROVIDER_SITE_OTHER): Payer: 59 | Admitting: *Deleted

## 2015-07-28 ENCOUNTER — Telehealth: Payer: Self-pay | Admitting: Internal Medicine

## 2015-07-28 DIAGNOSIS — I480 Paroxysmal atrial fibrillation: Secondary | ICD-10-CM | POA: Diagnosis not present

## 2015-07-28 MED ORDER — DILTIAZEM HCL ER COATED BEADS 120 MG PO CP24: 120.0000 mg | ORAL_CAPSULE | Freq: Every day | ORAL | Status: DC

## 2015-07-28 MED ORDER — FLECAINIDE ACETATE 100 MG PO TABS: 100.0000 mg | ORAL_TABLET | Freq: Two times a day (BID) | ORAL | Status: DC

## 2015-07-28 NOTE — Telephone Encounter (Addendum)
Spoke with patient and he is  wanting to try medications for now and cancel the ablation.  I have discussed with Dr Rayann Heman and he can try Flecainide 100 mg twice daily and Diltiazem 120mg  daily.  He will need a follow up with Roderic Palau, NP in 2 weeks.  Patient is aware and is expecting a call from afib clinic to schedule appointment.  I have called the medications in   Appt made for 12/15 -- multiple questions answered during phone conversation regarding medications.  He did receive phone call regarding dilt/zocor interaction - told him this decision regarding dose change would be under discretion of Dr. Rayann Heman. Patient verbalized understanding.

## 2015-07-28 NOTE — Telephone Encounter (Signed)
New message      Talk to the nurse about his scheduled ablation procedure

## 2015-07-29 NOTE — Telephone Encounter (Signed)
Pt c/o medication issue:  1. Name of Medication: Diltizem 120 mg and simvastatin 40 mg   4. What is your medication issue? [facility] is calling states that there are two different doctors calling in these medications. Wanted the office of Dr. Rayann Heman to be aware that there could be a reaction if the patient takes both of these medications. Please call [facility] back to discuss.

## 2015-07-29 NOTE — Telephone Encounter (Signed)
Pt c/o medication issue:  1. Name of Medication: Diltizem 120 mg and simvastatin 40 mg   4. What is your medication issue? [facility] is calling states that there are two different doctors calling in these medications. Wanted the office of Dr. Rayann Heman to be aware that there could be a reaction if the patient takes both of these medications. Please call [facility] back to discuss.     Routing to [employer]

## 2015-07-29 NOTE — Progress Notes (Signed)
Carelink Summary Report / Loop Recorder 

## 2015-08-01 NOTE — Telephone Encounter (Signed)
There is a drug interaction between diltiazem and simvastatin - simvastatin dose should be limited to 10mg  due to increased risk of myalgias. Would recommend switching patient to rosuvastatin to avoid this drug interaction. Rosuvastatin 10mg  would be an equivalent dose to what he is currently taking.

## 2015-08-06 MED ORDER — ROSUVASTATIN CALCIUM 10 MG PO TABS: 10.0000 mg | ORAL_TABLET | Freq: Every day | ORAL | Status: DC

## 2015-08-06 NOTE — Telephone Encounter (Signed)
Spoke with patient and answered mulitple questions.  He is aware to stop the Zocor and that I have called in the generic Crestor 10mg  daily.  He will call me back for further questions or concerns

## 2015-08-08 ENCOUNTER — Telehealth: Payer: Self-pay | Admitting: Internal Medicine

## 2015-08-08 NOTE — Telephone Encounter (Signed)
Reviewed manual transmission and returned patient's call while he was out walking his dog.  Patient presents in sinus rhythm, but cardiac compass suggests patient had AF episode of ~8hr duration between 08/07/15 and 08/08/15, coordinating with patient's symptoms.  No ECGs available as ECG storage is set to longest episode only.  V rate histograms suggest that his average V rate during AF was ~60bpm.  Patient denies feeling his heart racing, but states that he notices the irregularity more than the rate.  Patient states he feels "fine" now, and states he is disappointed that the flecainide and diltiazem "didn't work as well as I had hoped they would".  He states that he has been taking all of his medications appropriately, including Xarelto.  Patient is scheduled to follow-up in AF Clinic on Thursday, 08/14/15 and will plan to call the AF Clinic on Monday for an earlier appointment (if possible) if his symptoms persist or worsen over the weekend.  He states that the symptoms are manageable at this time, but he is aware to seek emergency services if he feels that he is in acute distress.

## 2015-08-08 NOTE — Telephone Encounter (Signed)
Patient reports having an episode last night where he had an irregular heart rate.  This concerns him as it was bad enough that it kept him awake.  Patient would like to know if his Carelink transmission "shows anything".  Advised patient to send manual transmission and he states he will send when he gets home from walking his dog.  Patient is appreciative of return call.

## 2015-08-08 NOTE — Telephone Encounter (Signed)
New message      Talk to someone regarding his device.  He had an "episode" last night around 9pm and throughout the night.  He had irr heart beat and wanted to see if anything was recorded.  Please call

## 2015-08-13 ENCOUNTER — Other Ambulatory Visit: Payer: 59

## 2015-08-13 ENCOUNTER — Ambulatory Visit (HOSPITAL_COMMUNITY)
Admission: RE | Admit: 2015-08-13 | Discharge: 2015-08-13 | Disposition: A | Discharge status: Home / Self Care | Payer: 59 | Source: Ambulatory Visit | Attending: Nurse Practitioner | Admitting: Nurse Practitioner

## 2015-08-13 VITALS — BP 120/80 | HR 64 | Ht 67.0 in | Wt 241.4 lb

## 2015-08-13 DIAGNOSIS — I480 Paroxysmal atrial fibrillation: Secondary | ICD-10-CM | POA: Insufficient documentation

## 2015-08-14 ENCOUNTER — Inpatient Hospital Stay (HOSPITAL_COMMUNITY): Admission: RE | Admit: 2015-08-14 | Payer: 59 | Source: Ambulatory Visit | Admitting: Nurse Practitioner

## 2015-08-14 ENCOUNTER — Encounter (HOSPITAL_COMMUNITY): Payer: Self-pay | Admitting: Nurse Practitioner

## 2015-08-14 NOTE — Progress Notes (Signed)
Patient ID: Jorge Peterson, male   DOB: 1961-03-26, 54 y.o.   MRN: [id number]     Primary Care Physician: [name], MD Referring Physician: Dr. Sherlene Shams is a 54 y.o. male with a h/o atrial fibrillation with h/o ablation 4 years ago. He has done well until recently when his afib burden has increased by linq to around 16%. He saw Dr. Rayann Heman and initially was set up for repeat ablation, but then changed his mind in favor of trying antiarrythmic's He was started on flecainide 100 mg bid. He still is having some breakthrough afib, more fatigue on drug and is here to further discuss options. He is not a big caffeine user, no alcohol, sleep study in the past which was negative. Tries to exercise on a regular basis.  Today, he denies symptoms of palpitations, chest pain, shortness of breath, orthopnea, PND, lower extremity edema, dizziness, presyncope, syncope, or neurologic sequela. The patient is tolerating medications without difficulties and is otherwise without complaint today.   Past Medical History  Diagnosis Date  . Atrial fibrillation ([place])     s/p afib ablation 7/12  . Hyperlipidemia   . Bradycardia     asymptomatic  . Diverticulitis   . Hypertension   . Shortness of breath   . GERD (gastroesophageal reflux disease)   . Headache(784.0)   . Family history of anesthesia complication     father gets pneumonia after "  . Diabetes [facility])    Past Surgical History  Procedure Laterality Date  . Shoulder surgery      x 2  . Back surgery    . Atrial ablation surgery  03/24/11    PVI by JA  . Fistula surgery    . Tonsillectomy    . Bronchoscopy  01/23/2013     biopsy  . Video bronchoscopy with endobronchial ultrasound N/A 01/23/2013    Procedure: VIDEO BRONCHOSCOPY WITH ENDOBRONCHIAL ULTRASOUND;  Surgeon: Ivin Poot, MD;  Location: [place];  Service: Thoracic;  Laterality: N/A;  . Mediastinoscopy N/A 01/23/2013    Procedure: MEDIASTINOSCOPY;  Surgeon: Ivin Poot, MD;  Location: [place];  Service: Thoracic;  Laterality: N/A;  . Cholecystectomy  05/02/2014    LAP CHOLE  . Cholecystectomy N/A 05/02/2014    Procedure: LAPAROSCOPIC CHOLECYSTECTOMY WITH INTRAOPERATIVE CHOLANGIOGRAM;  Surgeon: Erroll Luna, MD;  Location: [place];  Service: General;  Laterality: N/A;  . Ercp N/A 05/25/2014    Procedure: ENDOSCOPIC RETROGRADE CHOLANGIOPANCREATOGRAPHY (ERCP);  Surgeon: [name], MD;  Location: [place];  Service: Endoscopy;  Laterality: N/A;  . Ep implantable device N/A 06/27/2015    Procedure: Loop Recorder Insertion;  Surgeon: [name], MD;  Location: [place] CV [facility];  Service: Cardiovascular;  Laterality: N/A;    Current Outpatient Prescriptions  Medication Sig Dispense Refill  . Biotin 1000 MCG tablet Take 1,000 mcg by mouth daily.    . Coenzyme Q10 (COQ10) 150 MG CAPS Take 150 mg by mouth daily.     [name] diltiazem (CARDIZEM CD) 120 MG 24 hr capsule Take 1 capsule (120 mg total) by mouth daily. 90 capsule 3  . fish oil-omega-3 fatty acids 1000 MG capsule Take 1-2 g by mouth 2 (two) times daily. 2 Capsules in the morning. 1 capsule in the evening    . flecainide (TAMBOCOR) 100 MG tablet Take 1 tablet (100 mg total) by mouth 2 (two) times daily. 180 tablet 3  . fluticasone (FLONASE) 50 MCG/ACT nasal spray Place  1 spray into the nose daily as needed for allergies.     . folic acid (FOLVITE) A999333 MCG tablet Take 400 mcg by mouth daily.      . hydrochlorothiazide (MICROZIDE) 12.5 MG capsule Take 12.5 mg by mouth daily.    [name] losartan (COZAAR) 100 MG tablet Take 100 mg by mouth daily.    . Multiple Vitamin (MULTIVITAMIN) tablet Take 1 tablet by mouth daily.     [name] omeprazole (PRILOSEC) 40 MG capsule Take 1 capsule by mouth daily.  0  . Probiotic Product (PROBIOTIC FORMULA PO) Take 1 capsule by mouth daily.    . rivaroxaban (XARELTO) 20 MG TABS tablet Take 1 tablet (20 mg total) by mouth daily with supper. 30 tablet 11  . rosuvastatin (CRESTOR) 10  MG tablet Take 1 tablet (10 mg total) by mouth daily. 90 tablet 3   No current facility-administered medications for this encounter.    Allergies  Allergen Reactions  . Oxycodone-Acetaminophen Itching and Rash    Pt knows this as PERCOCET    Social History   Social History  . Marital Status: Married    Spouse Name: N/A  . Number of Children: 3  . Years of Education: N/A   Occupational History  . Minister/ [employer]    Social History Main Topics  . Smoking status: Never Smoker   . Smokeless tobacco: Never Used  . Alcohol Use: No  . Drug Use: No  . Sexual Activity: Yes   Other Topics Concern  . Not on file   Social History Narrative   Lives in [place].  3 children are all healthy.   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   No siblings with CAD.    Family History  Problem Relation Age of Onset  . Diabetes Father   . Heart disease Father     CABG at 25    ROS- All systems are reviewed and negative except as per the HPI above  Physical Exam: Filed Vitals:   08/13/15 1444  BP: 120/80  Pulse: 64  Height: 5\' 7"  (1.702 m)  Weight: 241 lb 6.4 oz (109.498 kg)    GEN- The patient is well appearing, alert and oriented x 3 today.   Head- normocephalic, atraumatic Eyes-  Sclera clear, conjunctiva pink Ears- hearing intact Oropharynx- clear Neck- supple, no JVP Lymph- no cervical lymphadenopathy Lungs- Clear to ausculation bilaterally, normal work of breathing Heart- Regular rate and rhythm, no murmurs, rubs or gallops, PMI not laterally displaced GI- soft, NT, ND, + BS Extremities- no clubbing, cyanosis, or edema MS- no significant deformity or atrophy Skin- no rash or lesion Psych- euthymic mood, full affect Neuro- strength and sensation are intact  EKG- SR with first degree AV block, IRBBB pr injt 262 ms, QRS int 98 ms, QTc 427 ms.  Assessment and Plan: 1. PAF Burden improved on flecainide but still with breakthrough Does not feel  like he has his usual energy on drug Spent 30 mins face to face discussing options, continuing flecainide, tikosyn or repeat ablation Answered many questions for pt and wife Pt has a remote check 12/27 and can see if afib burden has decreased After that he wants to reconsider current therapy, repeat ablation or moving on to tikosyn   F/u as needed in afib clinic  [place]. Rebeckah Masih, [facility] [address] [place], [place] [address] [phone]

## 2015-08-20 ENCOUNTER — Other Ambulatory Visit: Payer: 59

## 2015-08-26 ENCOUNTER — Ambulatory Visit (INDEPENDENT_AMBULATORY_CARE_PROVIDER_SITE_OTHER): Payer: 59 | Admitting: *Deleted

## 2015-08-26 DIAGNOSIS — I480 Paroxysmal atrial fibrillation: Secondary | ICD-10-CM | POA: Diagnosis not present

## 2015-08-26 NOTE — Progress Notes (Signed)
Carelink Summary Report / Loop Recorder 

## 2015-09-02 ENCOUNTER — Ambulatory Visit (HOSPITAL_COMMUNITY): Admission: RE | Admit: 2015-09-02 | Payer: 59 | Source: Ambulatory Visit | Admitting: Internal Medicine

## 2015-09-02 ENCOUNTER — Encounter (HOSPITAL_COMMUNITY): Admission: RE | Payer: Self-pay | Source: Ambulatory Visit

## 2015-09-02 SURGERY — ATRIAL FIBRILLATION ABLATION
Anesthesia: Monitor Anesthesia Care

## 2015-09-06 LAB — CUP PACEART REMOTE DEVICE CHECK: Date Time Interrogation Session: 20161127113701

## 2015-09-17 ENCOUNTER — Other Ambulatory Visit: Payer: Self-pay | Admitting: Internal Medicine

## 2015-09-22 ENCOUNTER — Ambulatory Visit (HOSPITAL_COMMUNITY): Payer: 59 | Admitting: Nurse Practitioner

## 2015-09-22 ENCOUNTER — Telehealth: Payer: Self-pay | Admitting: *Deleted

## 2015-09-22 ENCOUNTER — Ambulatory Visit: Payer: 59 | Admitting: Internal Medicine

## 2015-09-22 MED ORDER — OMEPRAZOLE 40 MG PO CPDR: 40.0000 mg | DELAYED_RELEASE_CAPSULE | Freq: Every day | ORAL | Status: DC

## 2015-09-22 NOTE — Telephone Encounter (Signed)
Patient's wife, Rodena Piety requests rx for omeprazole for patient. Dr Deatra Ina rx'ed original presciption. Patient last seen 04/2014. I advised that patient needs an appointment to establish with Dr Hilarie Fredrickson. He is scheduled for 11/11/15. Dr Hilarie Fredrickson, may I refill omeprazole until that appointment?

## 2015-09-22 NOTE — Telephone Encounter (Signed)
This patient has already established with me Okay to refill omeprazole

## 2015-09-22 NOTE — Telephone Encounter (Signed)
Patient saw Dr Hilarie Fredrickson prior to seeing Dr Deatra Ina. Dr Hilarie Fredrickson states he would still like to see patient in March just to get an update on how he is doing. After that, we shouldn't need to see him frequently. Patient's wife, Rodena Piety is advised of this and rx has been sent until patient's appointment with Dr Hilarie Fredrickson.

## 2015-09-25 ENCOUNTER — Ambulatory Visit (INDEPENDENT_AMBULATORY_CARE_PROVIDER_SITE_OTHER): Payer: 59 | Admitting: *Deleted

## 2015-09-25 DIAGNOSIS — I480 Paroxysmal atrial fibrillation: Secondary | ICD-10-CM | POA: Diagnosis not present

## 2015-09-25 NOTE — Progress Notes (Signed)
Carelink Summary Report / Loop Recorder 

## 2015-10-04 LAB — CUP PACEART REMOTE DEVICE CHECK: Date Time Interrogation Session: 20161225021517

## 2015-10-06 ENCOUNTER — Telehealth: Payer: Self-pay | Admitting: *Deleted

## 2015-10-06 NOTE — Telephone Encounter (Signed)
Called patient regarding pause on Carelink transmission from 10/04/15.  Patient states he was asymptomatic at the time.  He asked about his AF burden.  Advised that per his device, it ranges from 4%-14%.  Patient states he has been doing well. He denies questions or concerns at this time.  Patient verbalizes understanding of instructions to call with worsening symptoms or questions.

## 2015-10-16 ENCOUNTER — Telehealth: Payer: Self-pay | Admitting: Internal Medicine

## 2015-10-16 NOTE — Telephone Encounter (Signed)
Pt says he would like to know the reading of his monitor please.

## 2015-10-16 NOTE — Telephone Encounter (Signed)
Patient called over to afib clinic - concerned b/c he is having increased lower extremity edema inc abdominal girth, states he weighs himself most days and sometimes will notice a few pounds of gain overnight.  States he has noticed increased trips to the bathroom during the night.  Not having any shortness of breath, orthopnea or chest pain. Discussed with Roderic Palau -- will bring in for appt 2/17 to assess patient and have device interrogated to assess current rhythm.

## 2015-10-17 ENCOUNTER — Ambulatory Visit (HOSPITAL_COMMUNITY)
Admission: RE | Admit: 2015-10-17 | Discharge: 2015-10-17 | Disposition: A | Discharge status: Home / Self Care | Payer: 59 | Source: Ambulatory Visit | Attending: Nurse Practitioner | Admitting: Nurse Practitioner

## 2015-10-17 ENCOUNTER — Encounter (HOSPITAL_COMMUNITY): Payer: Self-pay | Admitting: Nurse Practitioner

## 2015-10-17 VITALS — BP 118/82 | HR 61 | Ht 67.0 in | Wt 236.6 lb

## 2015-10-17 DIAGNOSIS — E785 Hyperlipidemia, unspecified: Secondary | ICD-10-CM | POA: Insufficient documentation

## 2015-10-17 DIAGNOSIS — E119 Type 2 diabetes mellitus without complications: Secondary | ICD-10-CM | POA: Diagnosis not present

## 2015-10-17 DIAGNOSIS — Z885 Allergy status to narcotic agent status: Secondary | ICD-10-CM | POA: Diagnosis not present

## 2015-10-17 DIAGNOSIS — I10 Essential (primary) hypertension: Secondary | ICD-10-CM | POA: Diagnosis not present

## 2015-10-17 DIAGNOSIS — Z7902 Long term (current) use of antithrombotics/antiplatelets: Secondary | ICD-10-CM | POA: Insufficient documentation

## 2015-10-17 DIAGNOSIS — K219 Gastro-esophageal reflux disease without esophagitis: Secondary | ICD-10-CM | POA: Insufficient documentation

## 2015-10-17 DIAGNOSIS — Z79899 Other long term (current) drug therapy: Secondary | ICD-10-CM | POA: Insufficient documentation

## 2015-10-17 DIAGNOSIS — M7989 Other specified soft tissue disorders: Secondary | ICD-10-CM

## 2015-10-17 DIAGNOSIS — R600 Localized edema: Secondary | ICD-10-CM | POA: Insufficient documentation

## 2015-10-17 DIAGNOSIS — Z833 Family history of diabetes mellitus: Secondary | ICD-10-CM | POA: Diagnosis not present

## 2015-10-17 DIAGNOSIS — I4891 Unspecified atrial fibrillation: Secondary | ICD-10-CM | POA: Diagnosis present

## 2015-10-17 DIAGNOSIS — Z8249 Family history of ischemic heart disease and other diseases of the circulatory system: Secondary | ICD-10-CM | POA: Insufficient documentation

## 2015-10-17 DIAGNOSIS — I440 Atrioventricular block, first degree: Secondary | ICD-10-CM | POA: Diagnosis not present

## 2015-10-17 DIAGNOSIS — I4510 Unspecified right bundle-branch block: Secondary | ICD-10-CM | POA: Diagnosis not present

## 2015-10-17 LAB — BASIC METABOLIC PANEL
Anion gap: 10 (ref 5–15)
BUN: 31 mg/dL — AB (ref 6–20)
CHLORIDE: 106 mmol/L (ref 101–111)
CO2: 24 mmol/L (ref 22–32)
CREATININE: 1.17 mg/dL (ref 0.61–1.24)
Calcium: 9.6 mg/dL (ref 8.9–10.3)
GFR calc Af Amer: >60 mL/min (ref >60)
GFR calc non Af Amer: >60 mL/min (ref >60)
Glucose, Bld: 128 mg/dL — ABNORMAL HIGH (ref 65–99)
Potassium: 3.7 mmol/L (ref 3.5–5.1)
SODIUM: 140 mmol/L (ref 135–145)

## 2015-10-17 LAB — CBC
HCT: 44.7 % (ref 39.0–52.0)
HEMOGLOBIN: 15.1 g/dL (ref 13.0–17.0)
MCH: 27.8 pg (ref 26.0–34.0)
MCHC: 33.8 g/dL (ref 30.0–36.0)
MCV: 82.2 fL (ref 78.0–100.0)
PLATELETS: 180 10*3/uL (ref 150–400)
RBC: 5.44 MIL/uL (ref 4.22–5.81)
RDW: 12.5 % (ref 11.5–15.5)
WBC: 7.5 10*3/uL (ref 4.0–10.5)

## 2015-10-17 LAB — TSH: TSH: 1.683 u[IU]/mL (ref 0.350–4.500)

## 2015-10-17 LAB — MAGNESIUM: MAGNESIUM: 2.1 mg/dL (ref 1.7–2.4)

## 2015-10-17 MED ORDER — METOPROLOL SUCCINATE ER 25 MG PO TB24: 25.0000 mg | ORAL_TABLET | Freq: Every day | ORAL | Status: DC

## 2015-10-17 MED ORDER — FLECAINIDE ACETATE 50 MG PO TABS: 75.0000 mg | ORAL_TABLET | Freq: Two times a day (BID) | ORAL | Status: DC

## 2015-10-17 NOTE — Patient Instructions (Signed)
Your physician has recommended you make the following change in your medication:  1)Decrease flecainide to 75mg  twice a day 2)Stop cardizem or diltiazem 3)Start Metoprolol 25mg  once a day

## 2015-10-17 NOTE — Progress Notes (Signed)
Patient ID: Jorge Peterson, male   DOB: Oct 29, 1960, 54 y.o.   MRN: [id number]     Primary Care Physician: [name], MD Referring Physician: Dr. Sherlene Shams is a 55 y.o. male with a h/o afib s/p abaltion x 1 and on flecainide 100 mg bid. He called to office yesterday with c/o of leg swelling and feeling of abdominal fullness. Swelling is usually worse in the pm. He is going to gym every day and exercising as usual and feels well. NO PND, orthopnea. He is avoiding salt. Takes 1/2 tab of hctz a day.He does have a LinQ and will interrogate today to see afib burden. He did call the office on [address] yesterday and asked for his recent reports and afib burden but did not hear anything back.EKG shows SR with first degree AVB, but with new RBBB and will discuss with Dr. Rayann Heman to see if adjustment in flecainide is warranted. His weight is down 6 lbs since last visit in December.  Today, he denies symptoms of palpitations, chest pain, shortness of breath, orthopnea, PND,  dizziness, presyncope, syncope, or neurologic sequela. Mild LLE and feeling of abdominal fullness. The patient is tolerating medications without difficulties and is otherwise without complaint today.   Past Medical History  Diagnosis Date  . Atrial fibrillation ([place])     s/p afib ablation 7/12  . Hyperlipidemia   . Bradycardia     asymptomatic  . Diverticulitis   . Hypertension   . Shortness of breath   . GERD (gastroesophageal reflux disease)   . Headache(784.0)   . Family history of anesthesia complication     father gets pneumonia after "  . Diabetes [facility])    Past Surgical History  Procedure Laterality Date  . Shoulder surgery      x 2  . Back surgery    . Atrial ablation surgery  03/24/11    PVI by JA  . Fistula surgery    . Tonsillectomy    . Bronchoscopy  01/23/2013     biopsy  . Video bronchoscopy with endobronchial ultrasound N/A 01/23/2013    Procedure: VIDEO BRONCHOSCOPY WITH  ENDOBRONCHIAL ULTRASOUND;  Surgeon: Ivin Poot, MD;  Location: [place];  Service: Thoracic;  Laterality: N/A;  . Mediastinoscopy N/A 01/23/2013    Procedure: MEDIASTINOSCOPY;  Surgeon: Ivin Poot, MD;  Location: [place];  Service: Thoracic;  Laterality: N/A;  . Cholecystectomy  05/02/2014    LAP CHOLE  . Cholecystectomy N/A 05/02/2014    Procedure: LAPAROSCOPIC CHOLECYSTECTOMY WITH INTRAOPERATIVE CHOLANGIOGRAM;  Surgeon: Erroll Luna, MD;  Location: [place];  Service: General;  Laterality: N/A;  . Ercp N/A 05/25/2014    Procedure: ENDOSCOPIC RETROGRADE CHOLANGIOPANCREATOGRAPHY (ERCP);  Surgeon: [name], MD;  Location: [place];  Service: Endoscopy;  Laterality: N/A;  . Ep implantable device N/A 06/27/2015    Procedure: Loop Recorder Insertion;  Surgeon: [name], MD;  Location: [place] [facility];  Service: Cardiovascular;  Laterality: N/A;    Current Outpatient Prescriptions  Medication Sig Dispense Refill  . Biotin 1000 MCG tablet Take 1,000 mcg by mouth daily.    . Coenzyme Q10 (COQ10) 150 MG CAPS Take 150 mg by mouth daily.     [name] diltiazem (CARDIZEM CD) 120 MG 24 hr capsule Take 1 capsule (120 mg total) by mouth daily. 90 capsule 3  . fish oil-omega-3 fatty acids 1000 MG capsule Take 1-2 g by mouth 2 (two) times daily. 2 Capsules in  the morning. 1 capsule in the evening    . flecainide (TAMBOCOR) 100 MG tablet Take 1 tablet (100 mg total) by mouth 2 (two) times daily. 180 tablet 3  . fluticasone (FLONASE) 50 MCG/ACT nasal spray Place 1 spray into the nose daily as needed for allergies.     . folic acid (FOLVITE) A999333 MCG tablet Take 400 mcg by mouth daily.      . hydrochlorothiazide (MICROZIDE) 12.5 MG capsule Take 12.5 mg by mouth daily.    [name] losartan (COZAAR) 100 MG tablet Take 100 mg by mouth daily.    . Multiple Vitamin (MULTIVITAMIN) tablet Take 1 tablet by mouth daily.     [name] omeprazole (PRILOSEC) 40 MG capsule Take 1 capsule (40 mg total) by mouth daily. 90 capsule  0  . Probiotic Product (PROBIOTIC FORMULA PO) Take 1 capsule by mouth daily.    . rivaroxaban (XARELTO) 20 MG TABS tablet Take 1 tablet (20 mg total) by mouth daily with supper. 30 tablet 11  . rosuvastatin (CRESTOR) 10 MG tablet Take 1 tablet (10 mg total) by mouth daily. 90 tablet 3   No current facility-administered medications for this encounter.    Allergies  Allergen Reactions  . Oxycodone-Acetaminophen Itching and Rash    Pt knows this as PERCOCET    Social History   Social History  . Marital Status: Married    Spouse Name: N/A  . Number of Children: 3  . Years of Education: N/A   Occupational History  . Minister/ Assoc. Pastor    Social History Main Topics  . Smoking status: Never Smoker   . Smokeless tobacco: Never Used  . Alcohol Use: No  . Drug Use: No  . Sexual Activity: Yes   Other Topics Concern  . Not on file   Social History Narrative   Lives in [place].  3 children are all healthy.   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   No siblings with CAD.    Family History  Problem Relation Age of Onset  . Diabetes Father   . Heart disease Father     CABG at 64    ROS- All systems are reviewed and negative except as per the HPI above  Physical Exam: Filed Vitals:   10/17/15 0847  BP: 118/82  Pulse: 61  Height: 5\' 7"  (1.702 m)  Weight: 236 lb 9.6 oz (107.321 kg)  PO 96%  GEN- The patient is well appearing, alert and oriented x 3 today.   Head- normocephalic, atraumatic Eyes-  Sclera clear, conjunctiva pink Ears- hearing intact Oropharynx- clear Neck- supple, no JVP Lymph- no cervical lymphadenopathy Lungs- Clear to ausculation bilaterally, normal work of breathing Heart- Regular rate and rhythm, no murmurs, rubs or gallops, PMI not laterally displaced GI- soft, NT, ND, + BS Extremities- no clubbing, cyanosis, or trace edema MS- no significant deformity or atrophy Skin- no rash or lesion Psych- euthymic mood, full  affect Neuro- strength and sensation are intact  EKG-SR with first degree AVB, RBBB, moderate criteria for LVH, may be normal variant, pr int 254 ms, qrs int 136 ms, qtc 430 ms Epic records reviewed Linq  interrogated and reviewed with pt  Assessment and Plan: 1. Afib  Currently quiet per pt Interrogated with afib burden between 8-10% EKG with new RBBB and first degree AV block, discussed with Dr. Rayann Heman and flecainide will be reduced to 75 mg bid If has more afib burden then will discuss tikosyn or another ablation  2. LLE Stop cardizem and add metoprolol 25 mg ER qd Reduction of flecainide may help as well Continue hctz  F/u next week  Butch Penny C. Carroll, [facility] [address] [place], [place] [address] [phone]

## 2015-10-17 NOTE — Telephone Encounter (Signed)
Atrial fib burden was discussed with patient at [facility] visit on 10/17/15.  Encounter closed.

## 2015-10-23 ENCOUNTER — Other Ambulatory Visit (HOSPITAL_COMMUNITY): Payer: Self-pay | Admitting: *Deleted

## 2015-10-23 ENCOUNTER — Ambulatory Visit (HOSPITAL_COMMUNITY)
Admission: RE | Admit: 2015-10-23 | Discharge: 2015-10-23 | Disposition: A | Discharge status: Home / Self Care | Payer: 59 | Source: Ambulatory Visit | Attending: Nurse Practitioner | Admitting: Nurse Practitioner

## 2015-10-23 ENCOUNTER — Encounter (HOSPITAL_COMMUNITY): Payer: Self-pay | Admitting: Nurse Practitioner

## 2015-10-23 VITALS — BP 130/82 | HR 52 | Ht 67.0 in | Wt 234.6 lb

## 2015-10-23 DIAGNOSIS — I10 Essential (primary) hypertension: Secondary | ICD-10-CM | POA: Insufficient documentation

## 2015-10-23 DIAGNOSIS — E119 Type 2 diabetes mellitus without complications: Secondary | ICD-10-CM | POA: Insufficient documentation

## 2015-10-23 DIAGNOSIS — E785 Hyperlipidemia, unspecified: Secondary | ICD-10-CM | POA: Insufficient documentation

## 2015-10-23 DIAGNOSIS — R600 Localized edema: Secondary | ICD-10-CM | POA: Diagnosis not present

## 2015-10-23 DIAGNOSIS — K219 Gastro-esophageal reflux disease without esophagitis: Secondary | ICD-10-CM | POA: Diagnosis not present

## 2015-10-23 DIAGNOSIS — Z833 Family history of diabetes mellitus: Secondary | ICD-10-CM | POA: Diagnosis not present

## 2015-10-23 DIAGNOSIS — Z79899 Other long term (current) drug therapy: Secondary | ICD-10-CM | POA: Diagnosis not present

## 2015-10-23 DIAGNOSIS — Z885 Allergy status to narcotic agent status: Secondary | ICD-10-CM | POA: Insufficient documentation

## 2015-10-23 DIAGNOSIS — Z7902 Long term (current) use of antithrombotics/antiplatelets: Secondary | ICD-10-CM | POA: Diagnosis not present

## 2015-10-23 DIAGNOSIS — I4891 Unspecified atrial fibrillation: Secondary | ICD-10-CM | POA: Insufficient documentation

## 2015-10-23 DIAGNOSIS — M7989 Other specified soft tissue disorders: Secondary | ICD-10-CM

## 2015-10-23 DIAGNOSIS — Z8249 Family history of ischemic heart disease and other diseases of the circulatory system: Secondary | ICD-10-CM | POA: Insufficient documentation

## 2015-10-23 MED ORDER — FLECAINIDE ACETATE 150 MG PO TABS: 75.0000 mg | ORAL_TABLET | Freq: Two times a day (BID) | ORAL | Status: DC

## 2015-10-23 NOTE — Progress Notes (Signed)
Patient ID: Jorge Peterson, male   DOB: Apr 12, 1961, 55 y.o.   MRN: [id number]     Primary Care Physician: [name], MD Referring Physician: Dr. Sherlene Shams is a 55 y.o. male with a h/o afib s/p abaltion x 1 and on flecainide 100 mg bid. He was seen 2/27 with c/o of leg swelling and feeling of abdominal fullness. Swelling is usually worse in the pm. He is going to gym every day and exercising as usual and feels well. NO PND, orthopnea. He is avoiding salt. Takes 1/2 tab of hctz a day.He does have a LinQ and will interrogate today to see afib burden. He did call the office on [address] yesterday and asked for his recent reports and afib burden, but did not hear anything back.EKG shows SR with first degree AVB, but with new RBBB and will discuss with Dr. Rayann Heman to see if adjustment in flecainide is warranted. His weight is down 6 lbs since last visit in December.  Today, he denies symptoms of palpitations, chest pain, shortness of breath, orthopnea, PND,  dizziness, presyncope, syncope, or neurologic sequela. Mild LLE and feeling of abdominal fullness. The patient is tolerating medications without difficulties and is otherwise without complaint today.   Past Medical History  Diagnosis Date  . Atrial fibrillation ([place])     s/p afib ablation 7/12  . Hyperlipidemia   . Bradycardia     asymptomatic  . Diverticulitis   . Hypertension   . Shortness of breath   . GERD (gastroesophageal reflux disease)   . Headache(784.0)   . Family history of anesthesia complication     father gets pneumonia after "  . Diabetes [facility])    Past Surgical History  Procedure Laterality Date  . Shoulder surgery      x 2  . Back surgery    . Atrial ablation surgery  03/24/11    PVI by JA  . Fistula surgery    . Tonsillectomy    . Bronchoscopy  01/23/2013     biopsy  . Video bronchoscopy with endobronchial ultrasound N/A 01/23/2013    Procedure: VIDEO BRONCHOSCOPY WITH ENDOBRONCHIAL  ULTRASOUND;  Surgeon: Ivin Poot, MD;  Location: [place];  Service: Thoracic;  Laterality: N/A;  . Mediastinoscopy N/A 01/23/2013    Procedure: MEDIASTINOSCOPY;  Surgeon: Ivin Poot, MD;  Location: [place];  Service: Thoracic;  Laterality: N/A;  . Cholecystectomy  05/02/2014    LAP CHOLE  . Cholecystectomy N/A 05/02/2014    Procedure: LAPAROSCOPIC CHOLECYSTECTOMY WITH INTRAOPERATIVE CHOLANGIOGRAM;  Surgeon: Erroll Luna, MD;  Location: [place];  Service: General;  Laterality: N/A;  . Ercp N/A 05/25/2014    Procedure: ENDOSCOPIC RETROGRADE CHOLANGIOPANCREATOGRAPHY (ERCP);  Surgeon: [name], MD;  Location: [place];  Service: Endoscopy;  Laterality: N/A;  . Ep implantable device N/A 06/27/2015    Procedure: Loop Recorder Insertion;  Surgeon: [name], MD;  Location: [place] [facility];  Service: Cardiovascular;  Laterality: N/A;    Current Outpatient Prescriptions  Medication Sig Dispense Refill  . Biotin 1000 MCG tablet Take 1,000 mcg by mouth daily.    . Coenzyme Q10 (COQ10) 150 MG CAPS Take 150 mg by mouth daily.     . fish oil-omega-3 fatty acids 1000 MG capsule Take 1-2 g by mouth 2 (two) times daily. 2 Capsules in the morning. 1 capsule in the evening    . flecainide (TAMBOCOR) 50 MG tablet Take 1.5 tablets (75 mg total) by  mouth 2 (two) times daily. 90 tablet 3  . fluticasone (FLONASE) 50 MCG/ACT nasal spray Place 1 spray into the nose daily as needed for allergies.     . folic acid (FOLVITE) A999333 MCG tablet Take 400 mcg by mouth daily.      . hydrochlorothiazide (MICROZIDE) 12.5 MG capsule Take 12.5 mg by mouth daily.    [name] losartan (COZAAR) 100 MG tablet Take 100 mg by mouth daily.    . metoprolol succinate (TOPROL-XL) 25 MG 24 hr tablet Take 1 tablet (25 mg total) by mouth daily. 30 tablet 3  . Multiple Vitamin (MULTIVITAMIN) tablet Take 1 tablet by mouth daily.     [name] omeprazole (PRILOSEC) 40 MG capsule Take 1 capsule (40 mg total) by mouth daily. 90 capsule 0  .  Probiotic Product (PROBIOTIC FORMULA PO) Take 1 capsule by mouth daily.    . rivaroxaban (XARELTO) 20 MG TABS tablet Take 1 tablet (20 mg total) by mouth daily with supper. 30 tablet 11  . rosuvastatin (CRESTOR) 10 MG tablet Take 1 tablet (10 mg total) by mouth daily. 90 tablet 3   No current facility-administered medications for this encounter.    Allergies  Allergen Reactions  . Oxycodone-Acetaminophen Itching and Rash    Pt knows this as PERCOCET    Social History   Social History  . Marital Status: Married    Spouse Name: N/A  . Number of Children: 3  . Years of Education: N/A   Occupational History  . Minister/ Assoc. Pastor    Social History Main Topics  . Smoking status: Never Smoker   . Smokeless tobacco: Never Used  . Alcohol Use: No  . Drug Use: No  . Sexual Activity: Yes   Other Topics Concern  . Not on file   Social History Narrative   Lives in [place].  3 children are all healthy.   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   No siblings with CAD.    Family History  Problem Relation Age of Onset  . Diabetes Father   . Heart disease Father     CABG at 74    ROS- All systems are reviewed and negative except as per the HPI above  Physical Exam: Filed Vitals:   10/23/15 1204  BP: 130/82  Pulse: 52  Height: 5\' 7"  (1.702 m)  Weight: 234 lb 9.6 oz (106.414 kg)  PO 96%  GEN- The patient is well appearing, alert and oriented x 3 today.   Head- normocephalic, atraumatic Eyes-  Sclera clear, conjunctiva pink Ears- hearing intact Oropharynx- clear Neck- supple, no JVP Lymph- no cervical lymphadenopathy Lungs- Clear to ausculation bilaterally, normal work of breathing Heart- Regular rate and rhythm, no murmurs, rubs or gallops, PMI not laterally displaced GI- soft, NT, ND, + BS Extremities- no clubbing, cyanosis, or trace edema MS- no significant deformity or atrophy Skin- no rash or lesion Psych- euthymic mood, full affect Neuro-  strength and sensation are intact  EKG-SR with first degree AVB, IRBBB, moderate criteria for LVH, may be normal variant, pr int 250 ms, qrs int 102 ms, qtc 399 ms Epic records reviewed Linq  interrogated and reviewed with pt, afib burden between 8-10%  Assessment and Plan: 1. Afib  Maintaining SR, has not noticed any afib with recent dose change of flecainide Intervals improved with reduction of flecainide Exercising at gym and feels well HR slower on metoprolol but feels well, if becomes slower, may need to decrease to 12.5 mg  qd  2. LLE Improved with change from Cardizem to metoprolol and with lower dose of flecainde  F/u with Dr. Rayann Heman as scheduled on recall for April  Donna C. Carroll, [facility] [address] [place],  [address] [phone]

## 2015-10-24 ENCOUNTER — Other Ambulatory Visit: Payer: Self-pay | Admitting: Internal Medicine

## 2015-10-27 ENCOUNTER — Ambulatory Visit (INDEPENDENT_AMBULATORY_CARE_PROVIDER_SITE_OTHER): Payer: 59 | Admitting: *Deleted

## 2015-10-27 DIAGNOSIS — I480 Paroxysmal atrial fibrillation: Secondary | ICD-10-CM | POA: Diagnosis not present

## 2015-10-27 NOTE — Progress Notes (Signed)
Carelink Summary Report / Loop Recorder 

## 2015-10-28 ENCOUNTER — Encounter: Payer: Self-pay | Admitting: Internal Medicine

## 2015-10-30 ENCOUNTER — Telehealth (HOSPITAL_COMMUNITY): Payer: Self-pay | Admitting: *Deleted

## 2015-10-30 NOTE — Telephone Encounter (Signed)
Patient called in with a few concerns/questions -- states he woke up 2 nights ago at 3am and noticed his HR via fitbit was 38 which was concerning for him. Patient was asymptomatic when he noticed the HR and there is no alert of bradycardia or pause for this night on his LINQ report.  Also states he woke up this morning and keeps having these small pinches of left sided chest pain that last about 2 seconds - he does not notice any pressure/arm pain or tingling/jaw pain or shortness of breath with the episodes. States he has never noticed these small pains before and was concerned. Pinches do not seem to correlate with any activity and subside quickly on their own.  Encouraged patient to continue to monitor symptoms if continue may need to follow up with primary cardiologist in [place] or go in for EKG if symptoms begin to become for frequent or last longer.  Patient also instructed on when to report to ER. Patient verbalized understanding and was appreciative of advice. Patient did report his afib burden is much improved and his swelling has greatly improved since changing from diltiazem to metoprolol.

## 2015-11-06 ENCOUNTER — Encounter: Payer: Self-pay | Admitting: *Deleted

## 2015-11-11 ENCOUNTER — Encounter: Payer: Self-pay | Admitting: Internal Medicine

## 2015-11-11 ENCOUNTER — Ambulatory Visit (INDEPENDENT_AMBULATORY_CARE_PROVIDER_SITE_OTHER): Payer: 59 | Admitting: Internal Medicine

## 2015-11-11 VITALS — BP 120/80 | HR 68 | Ht 67.0 in | Wt 237.4 lb

## 2015-11-11 DIAGNOSIS — Z8719 Personal history of other diseases of the digestive system: Secondary | ICD-10-CM

## 2015-11-11 DIAGNOSIS — K219 Gastro-esophageal reflux disease without esophagitis: Secondary | ICD-10-CM

## 2015-11-11 DIAGNOSIS — Z7901 Long term (current) use of anticoagulants: Secondary | ICD-10-CM | POA: Diagnosis not present

## 2015-11-11 DIAGNOSIS — Z86010 Personal history of colonic polyps: Secondary | ICD-10-CM

## 2015-11-11 NOTE — Patient Instructions (Signed)
Please continue omeprazole 40 mg daily.  You will be due for a recall endoscopy/colonoscopy in 06/2016. Please talk to Dr Rayann Heman to see if you may discontinue Xarelto 2 days prior to your test. If he can place clearance in his office note, you may not need to be seen in clinic beforehand.

## 2015-11-11 NOTE — Progress Notes (Signed)
Subjective:    Patient ID: Jorge Peterson, male    DOB: 1961/04/05, 55 y.o.   MRN: [id number]  HPI Jorge Peterson is a 55 year old male with past medical history of adenomatous colon polyps, diverticulitis, GERD, gallstones status post cholecystectomy, retained CBD stone status post ERCP with stone extraction who is here for follow-up. He is here alone today. He reports that recently he has been doing very well. He called several months ago for refill on omeprazole. He takes 40 mg daily and with this his heartburn has been well controlled. Off this medicine he was having frequent heartburn and regurgitation. He denies dysphagia or odynophagia. No nausea or vomiting. Appetite has been very good. No issues with sore throat or coughing. No recent diverticulitis symptoms though he was treated for early diverticulitis symptoms with Augmentin in September 2016. He reports he is keenly aware of the symptoms when they start and he often calls quickly because in the past symptoms have progressed to more severe abdominal pain. Currently no abdominal pain. Bowel movements are regular without blood in his stool or melena. Since last being seen here he's had some recurrent atrial fibrillation after having a prior ablation 5 years ago. He's been started on Xarelto, flecainide, and metoprolol. He sees Dr. Rayann Heman with cardiology.  Review of Systems As per history of present illness, otherwise negative  Current Medications, Allergies, Past Medical History, Past Surgical History, Family History and Social History were reviewed in [employer] record.     Objective:   Physical Exam BP 120/80 mmHg  Pulse 68  Ht 5\' 7"  (1.702 m)  Wt 237 lb 6.4 oz (107.684 kg)  BMI 37.17 kg/m2 Constitutional: Well-developed and well-nourished. No distress. HEENT: Normocephalic and atraumatic. Conjunctivae are normal.  No scleral icterus. Neck: Neck supple. Trachea midline. Cardiovascular: Normal rate,  regular rhythm and intact distal pulses. No M/R/G Pulmonary/chest: Effort normal and breath sounds normal. No wheezing, rales or rhonchi. Abdominal: Soft, nontender, nondistended. Bowel sounds active throughout. There are no masses palpable. No hepatosplenomegaly. Extremities: no clubbing, cyanosis, or edema Neurological: Alert and oriented to person place and time. Skin: Skin is warm and dry.  Psychiatric: Normal mood and affect. Behavior is normal.  CBC    Component Value Date/Time   WBC 7.5 10/17/2015 0924   WBC 7.8 04/30/2014 1124   RBC 5.44 10/17/2015 0924   RBC 5.02 04/30/2014 1124   HGB 15.1 10/17/2015 0924   HGB 13.9 04/30/2014 1124   HCT 44.7 10/17/2015 0924   HCT 42.6 04/30/2014 1124   PLT 180 10/17/2015 0924   PLT 187 04/30/2014 1124   MCV 82.2 10/17/2015 0924   MCV 85 04/30/2014 1124   MCH 27.8 10/17/2015 0924   MCH 27.7 04/30/2014 1124   MCHC 33.8 10/17/2015 0924   MCHC 32.6 04/30/2014 1124   RDW 12.5 10/17/2015 0924   RDW 13.0 04/30/2014 1124   LYMPHSABS 1.3 05/24/2014 1155   MONOABS 0.8 05/24/2014 1155   EOSABS 0.1 05/24/2014 1155   BASOSABS 0.0 05/24/2014 1155       Assessment & Plan:  55 year old male with past medical history of adenomatous colon polyps, diverticulitis, GERD, gallstones status post cholecystectomy, retained CBD stone status post ERCP with stone extraction who is here for follow-up.  1. GERD -- Well-controlled on daily PPI. Frequent symptoms off PPI. We will continue omeprazole 40 mg daily. I have recommended upper endoscopy at the time of his next surveillance colonoscopy given his long-standing GERD to screen for Barrett's  esophagus. We discussed the risk of benefits and alternatives to endoscopy and he is agreeable to proceed at that time.  2. History of diverticulitis -- intermittent symptoms for which she is keenly aware. We discussed diet modifications which are supported with significant science. He does avoid popcorn. I recommended a  high-fiber diet. He is asked to notify should he develop recurrent diverticulitis symptoms which for him is very consistent left lower quadrant abdominal pain.  3. History of adenomatous colon polyps -- he is due for surveillance colonoscopy in December 2017. Last colonoscopy was in [place] in 2012. We will need to contact Dr. Rayann Heman to discuss holding Xarelto prior to this procedure. He has a follow-up with Dr. Rayann Heman next month and asked that he mention our plans for procedures later this year.  Annual follow-up, sooner if necessary 25 minutes spent with the patient today. Greater than 50% was spent in counseling and coordination of care with the patient

## 2015-11-19 LAB — CUP PACEART REMOTE DEVICE CHECK: Date Time Interrogation Session: 20170126120538

## 2015-11-24 ENCOUNTER — Ambulatory Visit (INDEPENDENT_AMBULATORY_CARE_PROVIDER_SITE_OTHER): Payer: 59 | Admitting: *Deleted

## 2015-11-24 DIAGNOSIS — I480 Paroxysmal atrial fibrillation: Secondary | ICD-10-CM

## 2015-11-24 NOTE — Progress Notes (Signed)
Carelink Summary Report / Loop Recorder 

## 2015-12-15 ENCOUNTER — Telehealth: Payer: Self-pay | Admitting: Pharmacist

## 2015-12-15 NOTE — Telephone Encounter (Signed)
Received fax from [facility] that pt will be having a back injection scheduled on 12/25/15 with request to hold Xarelto x3 days prior to injection. Pt on Xarelto for afib with CHADS2 score of 2, no history of stroke or TIA. Ok to hold Xarelto x3 days prior to injection. Clearance faxed to [facility] at [phone].

## 2015-12-22 ENCOUNTER — Encounter: Payer: Self-pay | Admitting: Internal Medicine

## 2015-12-22 ENCOUNTER — Ambulatory Visit (INDEPENDENT_AMBULATORY_CARE_PROVIDER_SITE_OTHER): Payer: 59 | Admitting: Internal Medicine

## 2015-12-22 VITALS — BP 126/84 | HR 51 | Ht 67.0 in | Wt 233.6 lb

## 2015-12-22 DIAGNOSIS — I480 Paroxysmal atrial fibrillation: Secondary | ICD-10-CM | POA: Diagnosis not present

## 2015-12-22 LAB — CUP PACEART INCLINIC DEVICE CHECK: MDC IDC SESS DTM: 20170424173647

## 2015-12-22 NOTE — Progress Notes (Signed)
Electrophysiology Office Note   Date:  12/22/2015   ID:  Jorge Peterson, DOB 1960/12/10, MRN [id number]  PCP:  [name], MD  Primary Electrophysiologist: [name], MD    Chief Complaint  Patient presents with  . Follow-up    no complaints     History of Present Illness: Jorge Peterson is a 55 y.o. male who presents today for electrophysiology evaluation.   The patient is s/p AF ablation by me in 2012.  He has done very well.  Afib burden is down from 16% to 3% with flecainide.  He is pleased with current health state.  Working on lifestyle modification.  Today, he denies symptoms of chest pain, shortness of breath, orthopnea, PND, lower extremity edema, claudication, dizziness, presyncope, syncope, bleeding, or neurologic sequela. The patient is tolerating medications without difficulties and is otherwise without complaint today.    Past Medical History  Diagnosis Date  . Atrial fibrillation ([place])     s/p afib ablation 7/12  . Hyperlipidemia   . Bradycardia     asymptomatic  . Diverticulitis   . Hypertension   . Shortness of breath   . GERD (gastroesophageal reflux disease)   . Headache(784.0)   . Family history of anesthesia complication     father gets pneumonia after "  . Diabetes ([place])   . Internal hemorrhoids   . Tubular adenoma of colon    Past Surgical History  Procedure Laterality Date  . Shoulder surgery      x 2  . Back surgery    . Atrial ablation surgery  03/24/11    PVI by JA  . Fistula surgery    . Tonsillectomy    . Bronchoscopy  01/23/2013     biopsy  . Video bronchoscopy with endobronchial ultrasound N/A 01/23/2013    Procedure: VIDEO BRONCHOSCOPY WITH ENDOBRONCHIAL ULTRASOUND;  Surgeon: Ivin Poot, MD;  Location: [facility];  Service: Thoracic;  Laterality: N/A;  . Mediastinoscopy N/A 01/23/2013    Procedure: MEDIASTINOSCOPY;  Surgeon: Ivin Poot, MD;  Location: [facility];  Service: Thoracic;  Laterality: N/A;  . Cholecystectomy   05/02/2014    LAP CHOLE  . Cholecystectomy N/A 05/02/2014    Procedure: LAPAROSCOPIC CHOLECYSTECTOMY WITH INTRAOPERATIVE CHOLANGIOGRAM;  Surgeon: Erroll Luna, MD;  Location: [facility];  Service: General;  Laterality: N/A;  . Ercp N/A 05/25/2014    Procedure: ENDOSCOPIC RETROGRADE CHOLANGIOPANCREATOGRAPHY (ERCP);  Surgeon: [name], MD;  Location: [facility];  Service: Endoscopy;  Laterality: N/A;  . Ep implantable device N/A 06/27/2015    Procedure: Loop Recorder Insertion;  Surgeon: [name], MD;  Location: [facility];  Service: Cardiovascular;  Laterality: N/A;     Current Outpatient Prescriptions  Medication Sig Dispense Refill  . Biotin 1000 MCG tablet Take 1,000 mcg by mouth daily.    . Coenzyme Q10 (COQ10) 150 MG CAPS Take 150 mg by mouth daily.     . fish oil-omega-3 fatty acids 1000 MG capsule Take 1-2 g by mouth 2 (two) times daily. 2 Capsules in the morning. 1 capsule in the evening    . flecainide (TAMBOCOR) 150 MG tablet Take 0.5 tablets (75 mg total) by mouth 2 (two) times daily. 30 tablet 6  . fluticasone (FLONASE) 50 MCG/ACT nasal spray Place 1 spray into the nose daily as needed for allergies.     . folic acid (FOLVITE) A999333 MCG tablet Take 400 mcg by mouth daily.      . hydrochlorothiazide (  MICROZIDE) 12.5 MG capsule Take 12.5 mg by mouth daily.    [name] losartan (COZAAR) 100 MG tablet Take 100 mg by mouth daily.    . metoprolol succinate (TOPROL-XL) 25 MG 24 hr tablet Take 1 tablet (25 mg total) by mouth daily. 30 tablet 3  . Multiple Vitamin (MULTIVITAMIN) tablet Take 1 tablet by mouth daily.     [name] omeprazole (PRILOSEC) 40 MG capsule Take 1 capsule (40 mg total) by mouth daily. 90 capsule 0  . Probiotic Product (PROBIOTIC FORMULA PO) Take 1 capsule by mouth daily.    . rivaroxaban (XARELTO) 20 MG TABS tablet Take 1 tablet (20 mg total) by mouth daily with supper. 30 tablet 11  . rosuvastatin (CRESTOR) 10 MG tablet Take 1 tablet (10 mg total) by mouth daily. 90  tablet 3   No current facility-administered medications for this visit.    Allergies:   Oxycodone-acetaminophen   Social History:  The patient  reports that he has never smoked. He has never used smokeless tobacco. He reports that he does not drink alcohol or use illicit drugs.   Family History:  The patient's  family history includes Diabetes in his father; Heart disease in his father.    ROS:  Please see the history of present illness.   All other systems are reviewed and negative.    PHYSICAL EXAM: VS:  BP 126/84 mmHg  Pulse 51  Ht 5\' 7"  (1.702 m)  Wt 233 lb 9.6 oz (105.96 kg)  BMI 36.58 kg/m2 , BMI Body mass index is 36.58 kg/(m^2). GEN: Well nourished, well developed, in no acute distress HEENT: normal Neck: no JVD, carotid bruits, or masses Cardiac: RRR; no murmurs, rubs, or gallops,no edema  Respiratory:  clear to auscultation bilaterally, normal work of breathing GI: soft, nontender, nondistended, + BS MS: no deformity or atrophy Skin: warm and dry  Neuro:  Strength and sensation are intact Psych: euthymic mood, full affect  ILR interrogation is reviewed (see paceart)   Recent Labs: 10/17/2015: BUN 31*; Creatinine, Ser 1.17; Hemoglobin 15.1; Magnesium 2.1; Platelets 180; Potassium 3.7; Sodium 140; TSH 1.683    Lipid Panel     Component Value Date/Time   CHOL 128 11/27/2012 0456   TRIG 123 11/27/2012 0456   HDL 34* 11/27/2012 0456   CHOLHDL 3.8 11/27/2012 0456   VLDL 25 11/27/2012 0456   LDLCALC 69 11/27/2012 0456     Wt Readings from Last 3 Encounters:  12/22/15 233 lb 9.6 oz (105.96 kg)  11/11/15 237 lb 6.4 oz (107.684 kg)  10/23/15 234 lb 9.6 oz (106.414 kg)      Other studies Reviewed: Additional studies/ records that were reviewed today include: ILR,echo Review of the above records today demonstrates: preserved EF, no significant valvular disease   ASSESSMENT AND PLAN:  1.  Paroxysmal atrial fibrillation Wishes to continue flecainide Will  follow with ILR (AF burden is now 3%) chads2vasc score is 1.  Doing well with xarelto.  Risks of medicine discussed.  He wishes to continue for now.  2. Obesity Weight loss encouraged  3. Bradycardia Asymptomatic He will all [place] if he has any symptomatic episodes Could consider reducing metoprolol  Current medicines are reviewed at length with the patient today.   The patient does not have concerns regarding his medicines.  The following changes were made today:  None  carelink Follow with Butch Penny in the AF clinic every 3 months I will see when needed   Signed, [name], MD  12/22/2015  2:21 PM     [facility] [address] [place] [address] [phone] (office) [phone] (fax)

## 2015-12-22 NOTE — Patient Instructions (Signed)
Medication Instructions:  Your physician recommends that you continue on your current medications as directed. Please refer to the Current Medication list given to you today.   Labwork: None ordered   Testing/Procedures: None ordered   Follow-Up:  Your physician recommends that you schedule a follow-up appointment in: 3 months with Donna Carroll, NP   Any Other Special Instructions Will Be Listed Below (If Applicable).     If you need a refill on your cardiac medications before your next appointment, please call your pharmacy.   

## 2015-12-23 ENCOUNTER — Emergency Department
Admission: EM | Admit: 2015-12-23 | Discharge: 2015-12-23 | Disposition: A | Discharge status: Home / Self Care | Payer: Worker's Compensation | Attending: Emergency Medicine | Admitting: Emergency Medicine

## 2015-12-23 ENCOUNTER — Emergency Department: Payer: Worker's Compensation

## 2015-12-23 ENCOUNTER — Encounter: Payer: Self-pay | Admitting: Emergency Medicine

## 2015-12-23 DIAGNOSIS — E785 Hyperlipidemia, unspecified: Secondary | ICD-10-CM | POA: Insufficient documentation

## 2015-12-23 DIAGNOSIS — Y999 Unspecified external cause status: Secondary | ICD-10-CM | POA: Insufficient documentation

## 2015-12-23 DIAGNOSIS — Y9368 Activity, volleyball (beach) (court): Secondary | ICD-10-CM | POA: Diagnosis not present

## 2015-12-23 DIAGNOSIS — Z7951 Long term (current) use of inhaled steroids: Secondary | ICD-10-CM | POA: Diagnosis not present

## 2015-12-23 DIAGNOSIS — M79651 Pain in right thigh: Secondary | ICD-10-CM | POA: Diagnosis present

## 2015-12-23 DIAGNOSIS — Z86010 Personal history of colonic polyps: Secondary | ICD-10-CM | POA: Insufficient documentation

## 2015-12-23 DIAGNOSIS — S86911A Strain of unspecified muscle(s) and tendon(s) at lower leg level, right leg, initial encounter: Secondary | ICD-10-CM | POA: Diagnosis not present

## 2015-12-23 DIAGNOSIS — Y929 Unspecified place or not applicable: Secondary | ICD-10-CM | POA: Insufficient documentation

## 2015-12-23 DIAGNOSIS — I10 Essential (primary) hypertension: Secondary | ICD-10-CM | POA: Insufficient documentation

## 2015-12-23 DIAGNOSIS — Z8679 Personal history of other diseases of the circulatory system: Secondary | ICD-10-CM | POA: Diagnosis not present

## 2015-12-23 DIAGNOSIS — Z79899 Other long term (current) drug therapy: Secondary | ICD-10-CM | POA: Diagnosis not present

## 2015-12-23 DIAGNOSIS — X509XXA Other and unspecified overexertion or strenuous movements or postures, initial encounter: Secondary | ICD-10-CM | POA: Diagnosis not present

## 2015-12-23 MED ORDER — DIAZEPAM 5 MG/ML IJ SOLN
5.0000 mg | Freq: Once | INTRAMUSCULAR | Status: AC
  Administered 2015-12-23: 5 mg via INTRAMUSCULAR
  Filled 2015-12-23: qty 2

## 2015-12-23 MED ORDER — OXYCODONE HCL 5 MG PO TABA: 5.0000 mg | ORAL_TABLET | Freq: Three times a day (TID) | ORAL | Status: DC

## 2015-12-23 MED ORDER — BACLOFEN 10 MG PO TABS: 10.0000 mg | ORAL_TABLET | Freq: Three times a day (TID) | ORAL | Status: DC

## 2015-12-23 MED ORDER — KETOROLAC TROMETHAMINE 60 MG/2ML IM SOLN
60.0000 mg | Freq: Once | INTRAMUSCULAR | Status: AC
  Administered 2015-12-23: 60 mg via INTRAMUSCULAR
  Filled 2015-12-23: qty 2

## 2015-12-23 NOTE — Discharge Instructions (Signed)
Muscle Strain A muscle strain is an injury that occurs when a muscle is stretched beyond its normal length. Usually a small number of muscle fibers are torn when this happens. Muscle strain is rated in degrees. First-degree strains have the least amount of muscle fiber tearing and pain. Second-degree and third-degree strains have increasingly more tearing and pain.  Usually, recovery from muscle strain takes 1-2 weeks. Complete healing takes 5-6 weeks.  CAUSES  Muscle strain happens when a sudden, violent force placed on a muscle stretches it too far. This may occur with lifting, sports, or a fall.  RISK FACTORS Muscle strain is especially common in athletes.  SIGNS AND SYMPTOMS At the site of the muscle strain, there may be:  Pain.  Bruising.  Swelling.  Difficulty using the muscle due to pain or lack of normal function. DIAGNOSIS  Your health care provider will perform a physical exam and ask about your medical history. TREATMENT  Often, the best treatment for a muscle strain is resting, icing, and applying cold compresses to the injured area.  HOME CARE INSTRUCTIONS   Use the PRICE method of treatment to promote muscle healing during the first 2-3 days after your injury. The PRICE method involves:  Protecting the muscle from being injured again.  Restricting your activity and resting the injured body part.  Icing your injury. To do this, put ice in a plastic bag. Place a towel between your skin and the bag. Then, apply the ice and leave it on from 15-20 minutes each hour. After the third day, switch to moist heat packs.  Apply compression to the injured area with a splint or elastic bandage. Be careful not to wrap it too tightly. This may interfere with blood circulation or increase swelling.  Elevate the injured body part above the level of your heart as often as you can.  Only take over-the-counter or prescription medicines for pain, discomfort, or fever as directed by your  health care provider.  Warming up prior to exercise helps to prevent future muscle strains. SEEK MEDICAL CARE IF:   You have increasing pain or swelling in the injured area.  You have numbness, tingling, or a significant loss of strength in the injured area. MAKE SURE YOU:   Understand these instructions.  Will watch your condition.  Will get help right away if you are not doing well or get worse.   This information is not intended to replace advice given to you by your health care provider. Make sure you discuss any questions you have with your health care provider.   Document Released: 08/16/2005 Document Revised: 06/06/2013 Document Reviewed: 03/15/2013 Elsevier Interactive Patient Education 2016 [place].  Cryotherapy Cryotherapy means treatment with cold. Ice or gel packs can be used to reduce both pain and swelling. Ice is the most helpful within the first 24 to 48 hours after an injury or flare-up from overusing a muscle or joint. Sprains, strains, spasms, burning pain, shooting pain, and aches can all be eased with ice. Ice can also be used when recovering from surgery. Ice is effective, has very few side effects, and is safe for most people to use. PRECAUTIONS  Ice is not a safe treatment option for people with:  Raynaud phenomenon. This is a condition affecting small blood vessels in the extremities. Exposure to cold may cause your problems to return.  Cold hypersensitivity. There are many forms of cold hypersensitivity, including:  Cold urticaria. Red, itchy hives appear on the skin when the  tissues begin to warm after being iced.  Cold erythema. This is a red, itchy rash caused by exposure to cold.  Cold hemoglobinuria. Red blood cells break down when the tissues begin to warm after being iced. The hemoglobin that carry oxygen are passed into the urine because they cannot combine with blood proteins fast enough.  Numbness or altered sensitivity in the area being  iced. If you have any of the following conditions, do not use ice until you have discussed cryotherapy with your caregiver:  Heart conditions, such as arrhythmia, angina, or chronic heart disease.  High blood pressure.  Healing wounds or open skin in the area being iced.  Current infections.  Rheumatoid arthritis.  Poor circulation.  Diabetes. Ice slows the blood flow in the region it is applied. This is beneficial when trying to stop inflamed tissues from spreading irritating chemicals to surrounding tissues. However, if you expose your skin to cold temperatures for too long or without the proper protection, you can damage your skin or nerves. Watch for signs of skin damage due to cold. HOME CARE INSTRUCTIONS Follow these tips to use ice and cold packs safely.  Place a dry or damp towel between the ice and skin. A damp towel will cool the skin more quickly, so you may need to shorten the time that the ice is used.  For a more rapid response, add gentle compression to the ice.  Ice for no more than 10 to 20 minutes at a time. The bonier the area you are icing, the less time it will take to get the benefits of ice.  Check your skin after 5 minutes to make sure there are no signs of a poor response to cold or skin damage.  Rest 20 minutes or more between uses.  Once your skin is numb, you can end your treatment. You can test numbness by very lightly touching your skin. The touch should be so light that you do not see the skin dimple from the pressure of your fingertip. When using ice, most people will feel these normal sensations in this order: cold, burning, aching, and numbness.  Do not use ice on someone who cannot communicate their responses to pain, such as small children or people with dementia. HOW TO MAKE AN ICE PACK Ice packs are the most common way to use ice therapy. Other methods include ice massage, ice baths, and cryosprays. Muscle creams that cause a cold, tingly  feeling do not offer the same benefits that ice offers and should not be used as a substitute unless recommended by your caregiver. To make an ice pack, do one of the following:  Place crushed ice or a bag of frozen vegetables in a sealable plastic bag. Squeeze out the excess air. Place this bag inside another plastic bag. Slide the bag into a pillowcase or place a damp towel between your skin and the bag.  Mix 3 parts water with 1 part rubbing alcohol. Freeze the mixture in a sealable plastic bag. When you remove the mixture from the freezer, it will be slushy. Squeeze out the excess air. Place this bag inside another plastic bag. Slide the bag into a pillowcase or place a damp towel between your skin and the bag. SEEK MEDICAL CARE IF:  You develop white spots on your skin. This may give the skin a blotchy (mottled) appearance.  Your skin turns blue or pale.  Your skin becomes waxy or hard.  Your swelling gets worse. MAKE SURE  YOU:   Understand these instructions.  Will watch your condition.  Will get help right away if you are not doing well or get worse.   This information is not intended to replace advice given to you by your health care provider. Make sure you discuss any questions you have with your health care provider.   Document Released: 04/12/2011 Document Revised: 09/06/2014 Document Reviewed: 04/12/2011 Elsevier Interactive Patient Education [employer].

## 2015-12-23 NOTE — ED Notes (Signed)
Says playing basket ball and injured right thigh-above knee and it feels like muscle.  Has ice pack on already

## 2015-12-23 NOTE — ED Notes (Signed)
Pt discharged home after verbalizing understanding of discharge instructions; nad noted. 

## 2015-12-23 NOTE — ED Notes (Signed)
Marianna Fuss, [employer] stated they do not require any type of drug testing for worker's comp.  12/23/15  12:30pm

## 2015-12-23 NOTE — ED Provider Notes (Signed)
[facility] Department Provider Note  ____________________________________________  Time seen: Approximately 11:51 AM  I have reviewed the triage vital signs and the nursing notes.   HISTORY  Chief Complaint Leg Pain    HPI ARLEY GAYDOS is a 55 y.o. male who presents for evaluation of right upper thigh pain. Patient reports he was playing volleyball when he felt something stretch and pop in his thigh. He complains of severe pain pain is 10 over 10. Reports unable to bend his knee or lift his leg.   Past Medical History  Diagnosis Date  . Atrial fibrillation ([place])     s/p afib ablation 7/12  . Hyperlipidemia   . Bradycardia     asymptomatic  . Diverticulitis   . Hypertension   . Shortness of breath   . GERD (gastroesophageal reflux disease)   . Headache(784.0)   . Family history of anesthesia complication     father gets pneumonia after "  . Diabetes ([place])   . Internal hemorrhoids   . Tubular adenoma of colon     Patient Active Problem List   Diagnosis Date Noted  . Overweight 07/14/2015  . Nonspecific (abnormal) findings on radiological and other examination of biliary tract 05/25/2014  . Common bile duct (CBD) obstruction 05/24/2014  . Choledocholithiasis 05/24/2014  . Acute cholecystitis 05/02/2014  . Hx of adenomatous colonic polyps 04/12/2013  . Solitary pulmonary nodule 02/09/2013  . H/O lymph node biopsy 01/24/2013  . Borderline diabetes 11/27/2012  . Abnormal CT of the chest   11/26/2012  . History of adenomatous polyp of colon 11/05/2011  . Sigmoid diverticulitis 10/23/2011  . Leukocytosis 10/23/2011  . HLD (hyperlipidemia) 10/23/2011  . HTN (hypertension) 10/23/2011  . Bradycardia 10/07/2010  . Atrial fibrillation ([place]) 12/03/2009    Past Surgical History  Procedure Laterality Date  . Shoulder surgery      x 2  . Back surgery    . Atrial ablation surgery  03/24/11    PVI by JA  . Fistula surgery    .  Tonsillectomy    . Bronchoscopy  01/23/2013     biopsy  . Video bronchoscopy with endobronchial ultrasound N/A 01/23/2013    Procedure: VIDEO BRONCHOSCOPY WITH ENDOBRONCHIAL ULTRASOUND;  Surgeon: Ivin Poot, MD;  Location: [facility];  Service: Thoracic;  Laterality: N/A;  . Mediastinoscopy N/A 01/23/2013    Procedure: MEDIASTINOSCOPY;  Surgeon: Ivin Poot, MD;  Location: [facility];  Service: Thoracic;  Laterality: N/A;  . Cholecystectomy  05/02/2014    LAP CHOLE  . Cholecystectomy N/A 05/02/2014    Procedure: LAPAROSCOPIC CHOLECYSTECTOMY WITH INTRAOPERATIVE CHOLANGIOGRAM;  Surgeon: Erroll Luna, MD;  Location: [facility];  Service: General;  Laterality: N/A;  . Ercp N/A 05/25/2014    Procedure: ENDOSCOPIC RETROGRADE CHOLANGIOPANCREATOGRAPHY (ERCP);  Surgeon: [name], MD;  Location: [facility];  Service: Endoscopy;  Laterality: N/A;  . Ep implantable device N/A 06/27/2015    Procedure: Loop Recorder Insertion;  Surgeon: [name], MD;  Location: [facility];  Service: Cardiovascular;  Laterality: N/A;    Current Outpatient Rx  Name  Route  Sig  Dispense  Refill  . baclofen (LIORESAL) 10 MG tablet   Oral   Take 1 tablet (10 mg total) by mouth 3 (three) times daily.   30 tablet   0   . Biotin 1000 MCG tablet   Oral   Take 1,000 mcg by mouth daily.         [name]  Coenzyme Q10 (COQ10) 150 MG CAPS   Oral   Take 150 mg by mouth daily.          . fish oil-omega-3 fatty acids 1000 MG capsule   Oral   Take 1-2 g by mouth 2 (two) times daily. 2 Capsules in the morning. 1 capsule in the evening         . flecainide (TAMBOCOR) 150 MG tablet   Oral   Take 0.5 tablets (75 mg total) by mouth 2 (two) times daily.   30 tablet   6   . fluticasone (FLONASE) 50 MCG/ACT nasal spray   Nasal   Place 1 spray into the nose daily as needed for allergies.          . folic acid (FOLVITE) A999333 MCG tablet   Oral   Take 400 mcg by mouth daily.           . hydrochlorothiazide  (MICROZIDE) 12.5 MG capsule   Oral   Take 12.5 mg by mouth daily.         [name] losartan (COZAAR) 100 MG tablet   Oral   Take 100 mg by mouth daily.         . metoprolol succinate (TOPROL-XL) 25 MG 24 hr tablet   Oral   Take 1 tablet (25 mg total) by mouth daily.   30 tablet   3   . Multiple Vitamin (MULTIVITAMIN) tablet   Oral   Take 1 tablet by mouth daily.          [name] omeprazole (PRILOSEC) 40 MG capsule   Oral   Take 1 capsule (40 mg total) by mouth daily.   90 capsule   0   . OxyCODONE HCl, Abuse Deter, (OXAYDO) 5 MG TABA   Oral   Take 5 mg by mouth 3 (three) times daily.   12 tablet   0   . Probiotic Product (PROBIOTIC FORMULA PO)   Oral   Take 1 capsule by mouth daily.         . rivaroxaban (XARELTO) 20 MG TABS tablet   Oral   Take 1 tablet (20 mg total) by mouth daily with supper.   30 tablet   11   . rosuvastatin (CRESTOR) 10 MG tablet   Oral   Take 1 tablet (10 mg total) by mouth daily.   90 tablet   3     Allergies Oxycodone-acetaminophen  Family History  Problem Relation Age of Onset  . Diabetes Father   . Heart disease Father     CABG at 63    Social History Social History  Substance Use Topics  . Smoking status: Never Smoker   . Smokeless tobacco: Never Used  . Alcohol Use: No    Review of Systems Constitutional: No fever/chills Eyes: No visual changes. ENT: No sore throat. Cardiovascular: Denies chest pain. Respiratory: Denies shortness of breath. Gastrointestinal: No abdominal pain.  No nausea, no vomiting.  No diarrhea.  No constipation. Genitourinary: Negative for dysuria. Musculoskeletal: Positive for right upper thigh pain. Skin: Negative for rash. Neurological: Negative for headaches, focal weakness or numbness.  10-point ROS otherwise negative.  ____________________________________________   PHYSICAL EXAM:  VITAL SIGNS: ED Triage Vitals  Enc Vitals Group     BP 12/23/15 1127 154/68 mmHg     Pulse Rate  12/23/15 1127 50     Resp 12/23/15 1127 16     Temp 12/23/15 1127 98.2 F (36.8 C)     Temp  Source 12/23/15 1127 Oral     SpO2 12/23/15 1127 96 %     Weight 12/23/15 1127 230 lb (104.327 kg)     Height 12/23/15 1127 5\' 7"  (1.702 m)     Head Cir --      Peak Flow --      Pain Score 12/23/15 1128 5     Pain Loc --      Pain Edu? --      Excl. in [place]? --     Constitutional: Alert and oriented. Well appearing and in no acute distress. Cardiovascular: Normal rate, regular rhythm. Grossly normal heart sounds.  Good peripheral circulation. Respiratory: Normal respiratory effort.  No retractions. Lungs CTAB. Musculoskeletal: No lower extremity tenderness nor edema.  No joint effusions. Neurologic:  Normal speech and language. No gross focal neurologic deficits are appreciated. No gait instability. Skin:  Skin is warm, dry and intact. No rash noted. Psychiatric: Mood and affect are normal. Speech and behavior are normal.  ____________________________________________   LABS (all labs ordered are listed, but only abnormal results are displayed)  Labs Reviewed - No data to display ____________________________________________   RADIOLOGY  No acute osseous findings. ____________________________________________   PROCEDURES  Procedure(s) performed: None  Critical Care performed: No  ____________________________________________   INITIAL IMPRESSION / ASSESSMENT AND PLAN / ED COURSE  Pertinent labs & imaging results that were available during my care of the patient were reviewed by me and considered in my medical decision making (see chart for details).  Patient discharged home with acute right lower leg extremity upper leg muscle strain. Patient and provided crutches as needed for comfort. Rx given for oxycodone 5 mg 3 times a day as needed for pain. Patient follow-up with Dr. Rudene Christians or return to the ER with any worsening  symptomology. ____________________________________________   FINAL CLINICAL IMPRESSION(S) / ED DIAGNOSES  Final diagnoses:  Muscle strain, lower leg, right, initial encounter     This chart was dictated using voice recognition software/Dragon. Despite best efforts to proofread, errors can occur which can change the meaning. Any change was purely unintentional.   Arlyss Repress, PA-C 12/23/15 1403  Lavonia Drafts, MD 12/23/15 [phone]

## 2015-12-24 ENCOUNTER — Ambulatory Visit (INDEPENDENT_AMBULATORY_CARE_PROVIDER_SITE_OTHER): Payer: 59 | Admitting: *Deleted

## 2015-12-24 DIAGNOSIS — I480 Paroxysmal atrial fibrillation: Secondary | ICD-10-CM

## 2015-12-24 NOTE — Progress Notes (Signed)
Carelink Summary Report / Loop Recorder 

## 2016-01-03 ENCOUNTER — Other Ambulatory Visit: Payer: Self-pay | Admitting: Internal Medicine

## 2016-01-05 ENCOUNTER — Other Ambulatory Visit: Payer: Self-pay | Admitting: Orthopedic Surgery

## 2016-01-05 ENCOUNTER — Telehealth: Payer: Self-pay | Admitting: Internal Medicine

## 2016-01-05 DIAGNOSIS — M25561 Pain in right knee: Secondary | ICD-10-CM

## 2016-01-05 NOTE — Telephone Encounter (Signed)
Reviewed with Dr. Curt Bears and pt may have MRI with loop recorder. Radiology will need to contact [employer] prior to MRI. I spoke with Tammy at Dr. Charlestine Night office and gave her this information. Will fax this phone note to [phone] I returned call to pt and told him it was OK to have MRI.

## 2016-01-05 NOTE — Telephone Encounter (Signed)
New message      Request for surgical clearance:  What type of surgery is being performed?  Prefer MRI---or if no MRI can he have a CT 1. When is this surgery scheduled? Pending clearance  Are there any medications that need to be held prior to surgery and how long?loop recorder 2. Name of physician performing surgery?  Dr Matilde Haymaker  3. What is your office phone and fax number?  [phone]

## 2016-01-05 NOTE — Telephone Encounter (Signed)
Follow up   Pt is calling for information of his loop recorder, he wants to know if he can have a CT done

## 2016-01-09 ENCOUNTER — Telehealth: Payer: Self-pay | Admitting: Cardiology

## 2016-01-09 ENCOUNTER — Other Ambulatory Visit: Payer: Self-pay | Admitting: Surgical

## 2016-01-09 NOTE — Telephone Encounter (Signed)
Follow Up  Pt is calling to follow up on cardiac clearance. Per note from 01/05/2016 from pat.    Expand All Collapse All   Reviewed with Dr. Curt Bears and pt may have MRI with loop recorder. Radiology will need to contact [employer] prior to MRI. I spoke with Tammy at Dr. Charlestine Night office and gave her this information. Will fax this phone note to [phone] I returned call to pt and told him it was OK to have MRI

## 2016-01-09 NOTE — Progress Notes (Signed)
Loop recorder interrogation 4/17, ov dr Rayann Heman 4/17

## 2016-01-09 NOTE — Progress Notes (Signed)
ekg 4/17 epic

## 2016-01-09 NOTE — Patient Instructions (Addendum)
Jorge Peterson  01/09/2016   Your procedure is scheduled on:01/13/16   Report to [facility]  Entrance take [facility]  elevators to 3rd floor to  [place] at 1:00 PM              Call this number if you have problems the morning of surgery [phone]   Remember: ONLY 1 PERSON MAY GO WITH YOU TO SHORT STAY TO GET  READY MORNING OF Jorge Peterson.  Do not eat food :After Midnight.tonight, may have CLEAR LIQUIDS UNTIL 0830, THEN NOTHING BY MOUTH     Take these medicines the morning of surgery with A SIP OF WATER: Baclofen, Flecanide, Omeprazole, Tramadol, Flonase nasal spray DO NOT TAKE ANY DIABETIC MEDICATIONS DAY OF YOUR SURGERY                               You may not have any metal on your body including hair pins and              piercings  Do not wear jewelry, lotions, powders or colognes, deodorant             Men may shave face and neck.   Do not bring valuables to the hospital. [place].  Contacts, dentures or bridgework may not be worn into surgery.      Patients discharged the day of surgery will not be allowed to drive home.  Name and phone number of your driver:Jorge Peterson (wife)               Please read over the following fact sheets you were given:INCENTIVE SPIROMETER; BLOOD TRANSFUSION INFORMATION SHEET _____________________________________________________________________             [facility] - Preparing for Surgery Before surgery, you can play an important role.  Because skin is not sterile, your skin needs to be as free of germs as possible.  You can reduce the number of germs on your skin by washing with CHG (chlorahexidine gluconate) soap before surgery.  CHG is an antiseptic cleaner which kills germs and bonds with the skin to continue killing germs even after washing. Please DO NOT use if you have an allergy to CHG or antibacterial soaps.  If your skin becomes  reddened/irritated stop using the CHG and inform your nurse when you arrive at Short Stay. Do not shave (including legs and underarms) for at least 48 hours prior to the first CHG shower.  You may shave your face/neck. Please follow these instructions carefully:  1.  Shower with CHG Soap the night before surgery and the  morning of Surgery.  2.  If you choose to wash your hair, wash your hair first as usual with your  normal  shampoo.  3.  After you shampoo, rinse your hair and body thoroughly to remove the  shampoo.                           4.  Use CHG as you would any other liquid soap.  You can apply chg directly  to the skin and wash  Gently with a scrungie or clean washcloth.  5.  Apply the CHG Soap to your body ONLY FROM THE NECK DOWN.   Do not use on face/ open                           Wound or open sores. Avoid contact with eyes, ears mouth and genitals (private parts).                       Wash face,  Genitals (private parts) with your normal soap.             6.  Wash thoroughly, paying special attention to the area where your surgery  will be performed.  7.  Thoroughly rinse your body with warm water from the neck down.  8.  DO NOT shower/wash with your normal soap after using and rinsing off  the CHG Soap.                9.  Pat yourself dry with a clean towel.            10.  Wear clean pajamas.            11.  Place clean sheets on your bed the night of your first shower and do not  sleep with pets. Day of Surgery : Do not apply any lotions/deodorants the morning of surgery.  Please wear clean clothes to the hospital/surgery center.  FAILURE TO FOLLOW THESE INSTRUCTIONS MAY RESULT IN THE CANCELLATION OF YOUR SURGERY PATIENT SIGNATURE_________________________________  NURSE SIGNATURE__________________________________  ________________________________________________________________________   Jorge Peterson  An incentive spirometer is a tool that  can help keep your lungs clear and active. This tool measures how well you are filling your lungs with each breath. Taking long deep breaths may help reverse or decrease the chance of developing breathing (pulmonary) problems (especially infection) following:  A long period of time when you are unable to move or be active. BEFORE THE PROCEDURE   If the spirometer includes an indicator to show your best effort, your nurse or respiratory therapist will set it to a desired goal.  If possible, sit up straight or lean slightly forward. Try not to slouch.  Hold the incentive spirometer in an upright position. INSTRUCTIONS FOR USE   Sit on the edge of your bed if possible, or sit up as far as you can in bed or on a chair.  Hold the incentive spirometer in an upright position.  Breathe out normally.  Place the mouthpiece in your mouth and seal your lips tightly around it.  Breathe in slowly and as deeply as possible, raising the piston or the ball toward the top of the column.  Hold your breath for 3-5 seconds or for as long as possible. Allow the piston or ball to fall to the bottom of the column.  Remove the mouthpiece from your mouth and breathe out normally.  Rest for a few seconds and repeat Steps 1 through 7 at least 10 times every 1-2 hours when you are awake. Take your time and take a few normal breaths between deep breaths.  The spirometer may include an indicator to show your best effort. Use the indicator as a goal to work toward during each repetition.  After each set of 10 deep breaths, practice coughing to be sure your lungs are clear. If you have an incision (the cut made at the time of surgery),  support your incision when coughing by placing a pillow or rolled up towels firmly against it. Once you are able to get out of bed, walk around indoors and cough well. You may stop using the incentive spirometer when instructed by your caregiver.  RISKS AND COMPLICATIONS  Take your  time so you do not get dizzy or light-headed.  If you are in pain, you may need to take or ask for pain medication before doing incentive spirometry. It is harder to take a deep breath if you are having pain. AFTER USE  Rest and breathe slowly and easily.  It can be helpful to keep track of a log of your progress. Your caregiver can provide you with a simple table to help with this. If you are using the spirometer at home, follow these instructions: [place] IF:   You are having difficultly using the spirometer.  You have trouble using the spirometer as often as instructed.  Your pain medication is not giving enough relief while using the spirometer.  You develop fever of 100.5 F (38.1 C) or higher. SEEK IMMEDIATE MEDICAL CARE IF:   You cough up bloody sputum that had not been present before.  You develop fever of 102 F (38.9 C) or greater.  You develop worsening pain at or near the incision site. MAKE SURE YOU:   Understand these instructions.  Will watch your condition.  Will get help right away if you are not doing well or get worse. Document Released: 12/27/2006 Document Revised: 11/08/2011 Document Reviewed: 02/27/2007 ExitCare Patient Information 2014 ExitCare, [place].   ________________________________________________________________________  WHAT IS A BLOOD TRANSFUSION? Blood Transfusion Information  A transfusion is the replacement of blood or some of its parts. Blood is made up of multiple cells which provide different functions.  Red blood cells carry oxygen and are used for blood loss replacement.  White blood cells fight against infection.  Platelets control bleeding.  Plasma helps clot blood.  Other blood products are available for specialized needs, such as hemophilia or other clotting disorders. BEFORE THE TRANSFUSION  Who gives blood for transfusions?   Healthy volunteers who are fully evaluated to make sure their blood is safe. This  is blood bank blood. Transfusion therapy is the safest it has ever been in the practice of medicine. Before blood is taken from a donor, a complete history is taken to make sure that person has no history of diseases nor engages in risky social behavior (examples are intravenous drug use or sexual activity with multiple partners). The donor's travel history is screened to minimize risk of transmitting infections, such as malaria. The donated blood is tested for signs of infectious diseases, such as HIV and hepatitis. The blood is then tested to be sure it is compatible with you in order to minimize the chance of a transfusion reaction. If you or a relative donates blood, this is often done in anticipation of surgery and is not appropriate for emergency situations. It takes many days to process the donated blood. RISKS AND COMPLICATIONS Although transfusion therapy is very safe and saves many lives, the main dangers of transfusion include:   Getting an infectious disease.  Developing a transfusion reaction. This is an allergic reaction to something in the blood you were given. Every precaution is taken to prevent this. The decision to have a blood transfusion has been considered carefully by your caregiver before blood is given. Blood is not given unless the benefits outweigh the risks. AFTER THE TRANSFUSION  Right after receiving a blood transfusion, you will usually feel much better and more energetic. This is especially true if your red blood cells have gotten low (anemic). The transfusion raises the level of the red blood cells which carry oxygen, and this usually causes an energy increase.  The nurse administering the transfusion will monitor you carefully for complications. HOME CARE INSTRUCTIONS  No special instructions are needed after a transfusion. You may find your energy is better. Speak with your caregiver about any limitations on activity for underlying diseases you may have. SEEK  MEDICAL CARE IF:   Your condition is not improving after your transfusion.  You develop redness or irritation at the intravenous (IV) site. SEEK IMMEDIATE MEDICAL CARE IF:  Any of the following symptoms occur over the next 12 hours:  Shaking chills.  You have a temperature by mouth above 102 F (38.9 C), not controlled by medicine.  Chest, back, or muscle pain.  People around you feel you are not acting correctly or are confused.  Shortness of breath or difficulty breathing.  Dizziness and fainting.  You get a rash or develop hives.  You have a decrease in urine output.  Your urine turns a dark color or changes to pink, red, or brown. Any of the following symptoms occur over the next 10 days:  You have a temperature by mouth above 102 F (38.9 C), not controlled by medicine.  Shortness of breath.  Weakness after normal activity.  The white part of the eye turns yellow (jaundice).  You have a decrease in the amount of urine or are urinating less often.  Your urine turns a dark color or changes to pink, red, or brown. Document Released: 08/13/2000 Document Revised: 11/08/2011 Document Reviewed: 04/01/2008 ExitCare Patient Information 2014 ExitCare, [place].  _______________________________________________________________________   CLEAR LIQUID DIET   Foods Allowed                                                                     Foods Excluded  Coffee and tea, regular and decaf                             liquids that you cannot  Plain Jell-O in any flavor                                             see through such as: Fruit ices (not with fruit pulp)                                     milk, soups, orange juice  Iced Popsicles                                    All solid food Carbonated beverages, regular and diet  Cranberry, grape and apple juices Sports drinks like Gatorade Lightly seasoned clear broth or consume(fat  free) Sugar, honey syrup  Sample Menu Breakfast                                Lunch                                     Supper Cranberry juice                    Beef broth                            Chicken broth Jell-O                                     Grape juice                           Apple juice Coffee or tea                        Jell-O                                      Popsicle                                                Coffee or tea                        Coffee or tea  _____________________________________________________________________

## 2016-01-09 NOTE — Telephone Encounter (Signed)
I spoke with the pt and he just saw Dr Gladstone Lighter this afternoon and they have scheduled him for surgery next Tuesday. The pt needs formal cardiac clearance from Dr Rayann Heman to proceed with surgery on Tuesday (REPAIR RIGHT QUADRICEP TENDON). The pt is having pre admission work up on Monday. Dr Rayann Heman is back in the office on Monday and I will forward this message to him for review. The pt will also need to hold Xarelto prior to surgery and Dr Gladstone Lighter requested 3 days.  The pt did hold this medication prior to his back injection.  I spoke with Dr Lovena Le and he said the pt is okay to hold Xarelto 3 days prior to surgery. I made the pt aware.

## 2016-01-11 ENCOUNTER — Other Ambulatory Visit: Payer: Self-pay

## 2016-01-11 LAB — CUP PACEART REMOTE DEVICE CHECK: Date Time Interrogation Session: 20170225120527

## 2016-01-11 NOTE — Progress Notes (Signed)
Carelink summary report received. Battery status OK. Normal device function. No new symptom episodes, tachy episodes, or brady episodes. 1 pause episode- previously addressed. 47 AF 6.5% +Xarelto. Monthly summary reports and ROV/PRN

## 2016-01-12 ENCOUNTER — Encounter (HOSPITAL_COMMUNITY)
Admission: RE | Admit: 2016-01-12 | Discharge: 2016-01-12 | Disposition: A | Discharge status: Home / Self Care | Payer: Worker's Compensation | Source: Ambulatory Visit | Attending: Orthopedic Surgery | Admitting: Orthopedic Surgery

## 2016-01-12 ENCOUNTER — Encounter (HOSPITAL_COMMUNITY): Payer: Self-pay

## 2016-01-12 ENCOUNTER — Ambulatory Visit (HOSPITAL_COMMUNITY)
Admission: RE | Admit: 2016-01-12 | Discharge: 2016-01-12 | Disposition: A | Discharge status: Home / Self Care | Payer: Worker's Compensation | Source: Ambulatory Visit | Attending: Surgical | Admitting: Surgical

## 2016-01-12 ENCOUNTER — Telehealth: Payer: Self-pay | Admitting: Internal Medicine

## 2016-01-12 DIAGNOSIS — Z01818 Encounter for other preprocedural examination: Secondary | ICD-10-CM

## 2016-01-12 HISTORY — DX: Tinnitus, unspecified ear: H93.19

## 2016-01-12 HISTORY — DX: Unspecified fall, initial encounter: W19.XXXA

## 2016-01-12 HISTORY — DX: Cardiac arrhythmia, unspecified: I49.9

## 2016-01-12 HISTORY — DX: Presence of spectacles and contact lenses: Z97.3

## 2016-01-12 LAB — COMPREHENSIVE METABOLIC PANEL
ALT: 82 U/L — ABNORMAL HIGH (ref 17–63)
AST: 38 U/L (ref 15–41)
Albumin: 4.4 g/dL (ref 3.5–5.0)
Alkaline Phosphatase: 82 U/L (ref 38–126)
Anion gap: 8 (ref 5–15)
BUN: 20 mg/dL (ref 6–20)
CO2: 30 mmol/L (ref 22–32)
Calcium: 9.6 mg/dL (ref 8.9–10.3)
Chloride: 101 mmol/L (ref 101–111)
Creatinine, Ser: 1.16 mg/dL (ref 0.61–1.24)
GFR calc Af Amer: >60 mL/min (ref >60)
GFR calc non Af Amer: >60 mL/min (ref >60)
Glucose, Bld: 170 mg/dL — ABNORMAL HIGH (ref 65–99)
Potassium: 4.5 mmol/L (ref 3.5–5.1)
Sodium: 139 mmol/L (ref 135–145)
Total Bilirubin: 0.7 mg/dL (ref 0.3–1.2)
Total Protein: 6.8 g/dL (ref 6.5–8.1)

## 2016-01-12 LAB — CBC WITH DIFFERENTIAL/PLATELET
Basophils Absolute: 0 10*3/uL (ref 0.0–0.1)
Basophils Relative: 0 %
Eosinophils Absolute: 0.2 10*3/uL (ref 0.0–0.7)
Eosinophils Relative: 3 %
HCT: 42.5 % (ref 39.0–52.0)
Hemoglobin: 14 g/dL (ref 13.0–17.0)
Lymphocytes Relative: 31 %
Lymphs Abs: 2.1 10*3/uL (ref 0.7–4.0)
MCH: 27.6 pg (ref 26.0–34.0)
MCHC: 32.9 g/dL (ref 30.0–36.0)
MCV: 83.7 fL (ref 78.0–100.0)
Monocytes Absolute: 0.6 10*3/uL (ref 0.1–1.0)
Monocytes Relative: 9 %
Neutro Abs: 3.7 10*3/uL (ref 1.7–7.7)
Neutrophils Relative %: 57 %
Platelets: 168 10*3/uL (ref 150–400)
RBC: 5.08 MIL/uL (ref 4.22–5.81)
RDW: 12.7 % (ref 11.5–15.5)
WBC: 6.5 10*3/uL (ref 4.0–10.5)

## 2016-01-12 LAB — ABO/RH: ABO/RH(D): O POS

## 2016-01-12 LAB — PROTIME-INR
INR: 0.99 (ref 0.00–1.49)
Prothrombin Time: 13.3 seconds (ref 11.6–15.2)

## 2016-01-12 LAB — APTT: aPTT: 28 seconds (ref 24–37)

## 2016-01-12 NOTE — Telephone Encounter (Signed)
Kim in MR has faxed clearance

## 2016-01-12 NOTE — Progress Notes (Signed)
Your patient has screened at an elevated risk for Obstructive Sleep Apnea using the Stop-Bang Tool during a pre-surgical visit. Pt scored at high risk.

## 2016-01-12 NOTE — Telephone Encounter (Signed)
Request for surgical clearance:  1. What type of surgery is being performed? Tendon Repair 2. When is surgery scheduled? Tomorrow-01-13-16-Need this asap   2. Are there any medications that need to be held prior to surgery and how long?General Cardiac Clearance   3. Name of physician performing surgery? Dr Amie Portland   4. What is your office phone and fax number? [phone] and Fax number is [phone] Att:Tonya  5.

## 2016-01-12 NOTE — Progress Notes (Signed)
Spoke with Sylvia/Dr Allred's office in regards to need for cardiac clearance.

## 2016-01-12 NOTE — Progress Notes (Signed)
Pt and his wife questioned pt being of ambulatory status. Thought he was to be OP. This nurse contacted Fabio Asa / scheduler with Dr Gladstone Lighter. LVMM in regards to situation.

## 2016-01-13 ENCOUNTER — Ambulatory Visit (HOSPITAL_COMMUNITY): Payer: Worker's Compensation | Admitting: Anesthesiology

## 2016-01-13 ENCOUNTER — Encounter (HOSPITAL_COMMUNITY)
Admission: RE | Disposition: A | Discharge status: Home / Self Care | Payer: Self-pay | Source: Ambulatory Visit | Attending: Orthopedic Surgery

## 2016-01-13 ENCOUNTER — Observation Stay (HOSPITAL_COMMUNITY)
Admission: RE | Admit: 2016-01-13 | Discharge: 2016-01-14 | Disposition: A | Discharge status: Home / Self Care | Payer: Worker's Compensation | Source: Ambulatory Visit | Attending: Orthopedic Surgery | Admitting: Orthopedic Surgery

## 2016-01-13 ENCOUNTER — Encounter (HOSPITAL_COMMUNITY): Payer: Self-pay

## 2016-01-13 DIAGNOSIS — E119 Type 2 diabetes mellitus without complications: Secondary | ICD-10-CM | POA: Insufficient documentation

## 2016-01-13 DIAGNOSIS — Z7901 Long term (current) use of anticoagulants: Secondary | ICD-10-CM | POA: Insufficient documentation

## 2016-01-13 DIAGNOSIS — E785 Hyperlipidemia, unspecified: Secondary | ICD-10-CM | POA: Diagnosis not present

## 2016-01-13 DIAGNOSIS — M79651 Pain in right thigh: Secondary | ICD-10-CM | POA: Diagnosis present

## 2016-01-13 DIAGNOSIS — Z79891 Long term (current) use of opiate analgesic: Secondary | ICD-10-CM | POA: Insufficient documentation

## 2016-01-13 DIAGNOSIS — S76119A Strain of unspecified quadriceps muscle, fascia and tendon, initial encounter: Secondary | ICD-10-CM | POA: Diagnosis present

## 2016-01-13 DIAGNOSIS — K219 Gastro-esophageal reflux disease without esophagitis: Secondary | ICD-10-CM | POA: Diagnosis not present

## 2016-01-13 DIAGNOSIS — S76111A Strain of right quadriceps muscle, fascia and tendon, initial encounter: Principal | ICD-10-CM | POA: Insufficient documentation

## 2016-01-13 DIAGNOSIS — I4891 Unspecified atrial fibrillation: Secondary | ICD-10-CM | POA: Diagnosis not present

## 2016-01-13 DIAGNOSIS — Z79899 Other long term (current) drug therapy: Secondary | ICD-10-CM | POA: Diagnosis not present

## 2016-01-13 DIAGNOSIS — Y9368 Activity, volleyball (beach) (court): Secondary | ICD-10-CM | POA: Diagnosis not present

## 2016-01-13 HISTORY — PX: QUADRICEPS TENDON REPAIR: SHX756

## 2016-01-13 LAB — TYPE AND SCREEN
ABO/RH(D): O POS
Antibody Screen: NEGATIVE

## 2016-01-13 LAB — GLUCOSE, CAPILLARY: Glucose-Capillary: 105 mg/dL — ABNORMAL HIGH (ref 65–99)

## 2016-01-13 SURGERY — REPAIR, TENDON, QUADRICEPS
Anesthesia: Regional | Site: Knee | Laterality: Right

## 2016-01-13 MED ORDER — HYDROMORPHONE HCL 2 MG PO TABS
2.0000 mg | ORAL_TABLET | ORAL | Status: DC | PRN
  Administered 2016-01-14: 2 mg via ORAL
  Filled 2016-01-13: qty 1

## 2016-01-13 MED ORDER — BUPIVACAINE-EPINEPHRINE (PF) 0.25% -1:200000 IJ SOLN
INTRAMUSCULAR | Status: DC | PRN
  Administered 2016-01-13: 30 mL via PERINEURAL

## 2016-01-13 MED ORDER — CEFAZOLIN SODIUM-DEXTROSE 2-4 GM/100ML-% IV SOLN
2.0000 g | INTRAVENOUS | Status: AC
  Administered 2016-01-13: 2 g via INTRAVENOUS

## 2016-01-13 MED ORDER — DEXAMETHASONE SODIUM PHOSPHATE 10 MG/ML IJ SOLN
INTRAMUSCULAR | Status: AC
  Filled 2016-01-13: qty 1

## 2016-01-13 MED ORDER — METOPROLOL SUCCINATE ER 25 MG PO TB24
25.0000 mg | ORAL_TABLET | Freq: Every evening | ORAL | Status: DC
  Administered 2016-01-13: 25 mg via ORAL
  Filled 2016-01-13: qty 1

## 2016-01-13 MED ORDER — LIDOCAINE HCL (CARDIAC) 20 MG/ML IV SOLN
INTRAVENOUS | Status: DC | PRN
  Administered 2016-01-13: 50 mg via INTRAVENOUS

## 2016-01-13 MED ORDER — PROPOFOL 10 MG/ML IV BOLUS
INTRAVENOUS | Status: AC
  Filled 2016-01-13: qty 40

## 2016-01-13 MED ORDER — SODIUM CHLORIDE 0.9 % IJ SOLN
INTRAMUSCULAR | Status: AC
  Filled 2016-01-13: qty 20

## 2016-01-13 MED ORDER — HYDROCHLOROTHIAZIDE 12.5 MG PO CAPS
12.5000 mg | ORAL_CAPSULE | Freq: Every day | ORAL | Status: DC
Start: 2016-01-14 — End: 2016-01-14
  Administered 2016-01-14: 12.5 mg via ORAL
  Filled 2016-01-13: qty 1

## 2016-01-13 MED ORDER — RIVAROXABAN 10 MG PO TABS: 20.0000 mg | ORAL_TABLET | Freq: Every day | ORAL | Status: DC

## 2016-01-13 MED ORDER — CELECOXIB 200 MG PO CAPS
200.0000 mg | ORAL_CAPSULE | Freq: Two times a day (BID) | ORAL | Status: DC
  Administered 2016-01-13 – 2016-01-14 (×2): 200 mg via ORAL
  Filled 2016-01-13 (×2): qty 1

## 2016-01-13 MED ORDER — ONDANSETRON HCL 4 MG PO TABS: 4.0000 mg | ORAL_TABLET | Freq: Four times a day (QID) | ORAL | Status: DC | PRN

## 2016-01-13 MED ORDER — BISACODYL 5 MG PO TBEC: 5.0000 mg | DELAYED_RELEASE_TABLET | Freq: Every day | ORAL | Status: DC | PRN

## 2016-01-13 MED ORDER — HYDROCODONE-ACETAMINOPHEN 7.5-325 MG PO TABS: 1.0000 | ORAL_TABLET | Freq: Once | ORAL | Status: DC | PRN

## 2016-01-13 MED ORDER — DEXAMETHASONE SODIUM PHOSPHATE 10 MG/ML IJ SOLN
INTRAMUSCULAR | Status: DC | PRN
  Administered 2016-01-13: 10 mg via INTRAVENOUS

## 2016-01-13 MED ORDER — ROCURONIUM BROMIDE 50 MG/5ML IV SOLN
INTRAVENOUS | Status: AC
  Filled 2016-01-13: qty 1

## 2016-01-13 MED ORDER — ACETAMINOPHEN 325 MG PO TABS: 650.0000 mg | ORAL_TABLET | Freq: Four times a day (QID) | ORAL | Status: DC | PRN

## 2016-01-13 MED ORDER — DEXTROSE 5 % IV SOLN
500.0000 mg | Freq: Four times a day (QID) | INTRAVENOUS | Status: DC | PRN
  Filled 2016-01-13: qty 5

## 2016-01-13 MED ORDER — ONDANSETRON HCL 4 MG/2ML IJ SOLN
INTRAMUSCULAR | Status: DC | PRN
  Administered 2016-01-13: 4 mg via INTRAVENOUS

## 2016-01-13 MED ORDER — ROSUVASTATIN CALCIUM 5 MG PO TABS
10.0000 mg | ORAL_TABLET | Freq: Every day | ORAL | Status: DC
  Administered 2016-01-14: 10 mg via ORAL
  Filled 2016-01-13: qty 2

## 2016-01-13 MED ORDER — LIDOCAINE HCL (CARDIAC) 20 MG/ML IV SOLN
INTRAVENOUS | Status: AC
  Filled 2016-01-13: qty 5

## 2016-01-13 MED ORDER — FENTANYL CITRATE (PF) 250 MCG/5ML IJ SOLN
INTRAMUSCULAR | Status: AC
  Filled 2016-01-13: qty 5

## 2016-01-13 MED ORDER — ACETAMINOPHEN 650 MG RE SUPP
650.0000 mg | Freq: Four times a day (QID) | RECTAL | Status: DC | PRN
Start: 2016-01-13 — End: 2016-01-14

## 2016-01-13 MED ORDER — CHLORHEXIDINE GLUCONATE 4 % EX LIQD: 60.0000 mL | Freq: Once | CUTANEOUS | Status: DC

## 2016-01-13 MED ORDER — MIDAZOLAM HCL 5 MG/5ML IJ SOLN
INTRAMUSCULAR | Status: DC | PRN
  Administered 2016-01-13 (×2): 1 mg via INTRAVENOUS

## 2016-01-13 MED ORDER — HYDROMORPHONE HCL 1 MG/ML IJ SOLN: 1.0000 mg | INTRAMUSCULAR | Status: DC | PRN

## 2016-01-13 MED ORDER — MEPERIDINE HCL 50 MG/ML IJ SOLN: 6.2500 mg | INTRAMUSCULAR | Status: DC | PRN

## 2016-01-13 MED ORDER — FLECAINIDE ACETATE 50 MG PO TABS
75.0000 mg | ORAL_TABLET | Freq: Two times a day (BID) | ORAL | Status: DC
  Administered 2016-01-13 – 2016-01-14 (×2): 75 mg via ORAL
  Filled 2016-01-13 (×4): qty 2

## 2016-01-13 MED ORDER — ONDANSETRON HCL 4 MG/2ML IJ SOLN
INTRAMUSCULAR | Status: AC
Start: 2016-01-13 — End: 2016-01-13
  Filled 2016-01-13: qty 2

## 2016-01-13 MED ORDER — METOCLOPRAMIDE HCL 5 MG/ML IJ SOLN: 5.0000 mg | Freq: Three times a day (TID) | INTRAMUSCULAR | Status: DC | PRN

## 2016-01-13 MED ORDER — HYDROMORPHONE HCL 1 MG/ML IJ SOLN: 0.2500 mg | INTRAMUSCULAR | Status: DC | PRN

## 2016-01-13 MED ORDER — SODIUM CHLORIDE 0.9 % IJ SOLN
INTRAMUSCULAR | Status: DC | PRN
  Administered 2016-01-13: 10 mL

## 2016-01-13 MED ORDER — BACITRACIN ZINC 500 UNIT/GM EX OINT
TOPICAL_OINTMENT | CUTANEOUS | Status: AC
  Filled 2016-01-13: qty 28.35

## 2016-01-13 MED ORDER — FENTANYL CITRATE (PF) 100 MCG/2ML IJ SOLN
INTRAMUSCULAR | Status: DC | PRN
  Administered 2016-01-13 (×2): 25 ug via INTRAVENOUS
  Administered 2016-01-13: 50 ug via INTRAVENOUS

## 2016-01-13 MED ORDER — SODIUM CHLORIDE 0.9 % IJ SOLN
INTRAMUSCULAR | Status: AC
  Filled 2016-01-13: qty 10

## 2016-01-13 MED ORDER — LACTATED RINGERS IV SOLN
INTRAVENOUS | Status: DC
  Administered 2016-01-13: 14:00:00 via INTRAVENOUS

## 2016-01-13 MED ORDER — BUPIVACAINE-EPINEPHRINE (PF) 0.5% -1:200000 IJ SOLN
INTRAMUSCULAR | Status: AC
  Filled 2016-01-13: qty 30

## 2016-01-13 MED ORDER — METHOCARBAMOL 500 MG PO TABS
500.0000 mg | ORAL_TABLET | Freq: Four times a day (QID) | ORAL | Status: DC | PRN
Start: 2016-01-13 — End: 2016-01-14

## 2016-01-13 MED ORDER — ONDANSETRON HCL 4 MG/2ML IJ SOLN: 4.0000 mg | Freq: Four times a day (QID) | INTRAMUSCULAR | Status: DC | PRN

## 2016-01-13 MED ORDER — METOCLOPRAMIDE HCL 5 MG PO TABS: 5.0000 mg | ORAL_TABLET | Freq: Three times a day (TID) | ORAL | Status: DC | PRN

## 2016-01-13 MED ORDER — CHLORHEXIDINE GLUCONATE 4 % EX LIQD
60.0000 mL | Freq: Once | CUTANEOUS | Status: DC
Start: 2016-01-13 — End: 2016-01-13

## 2016-01-13 MED ORDER — CEFAZOLIN SODIUM-DEXTROSE 2-4 GM/100ML-% IV SOLN
INTRAVENOUS | Status: AC
  Filled 2016-01-13: qty 100

## 2016-01-13 MED ORDER — LOSARTAN POTASSIUM 50 MG PO TABS
100.0000 mg | ORAL_TABLET | Freq: Every day | ORAL | Status: DC
Start: 2016-01-14 — End: 2016-01-14
  Administered 2016-01-14: 100 mg via ORAL
  Filled 2016-01-13: qty 2

## 2016-01-13 MED ORDER — CEFAZOLIN SODIUM 1-5 GM-% IV SOLN
1.0000 g | Freq: Four times a day (QID) | INTRAVENOUS | Status: AC
  Administered 2016-01-13 – 2016-01-14 (×3): 1 g via INTRAVENOUS
  Filled 2016-01-13 (×3): qty 50

## 2016-01-13 MED ORDER — BUPIVACAINE LIPOSOME 1.3 % IJ SUSP
INTRAMUSCULAR | Status: DC | PRN
  Administered 2016-01-13: 20 mL

## 2016-01-13 MED ORDER — PROPOFOL 10 MG/ML IV BOLUS
INTRAVENOUS | Status: DC | PRN
  Administered 2016-01-13: 200 mg via INTRAVENOUS

## 2016-01-13 MED ORDER — BUPIVACAINE LIPOSOME 1.3 % IJ SUSP
20.0000 mL | Freq: Once | INTRAMUSCULAR | Status: DC
  Filled 2016-01-13: qty 20

## 2016-01-13 MED ORDER — PANTOPRAZOLE SODIUM 40 MG PO TBEC
40.0000 mg | DELAYED_RELEASE_TABLET | Freq: Every day | ORAL | Status: DC
  Administered 2016-01-14: 40 mg via ORAL
  Filled 2016-01-13: qty 1

## 2016-01-13 MED ORDER — FLEET ENEMA 7-19 GM/118ML RE ENEM: 1.0000 | ENEMA | Freq: Once | RECTAL | Status: DC | PRN

## 2016-01-13 MED ORDER — POLYETHYLENE GLYCOL 3350 17 G PO PACK: 17.0000 g | PACK | Freq: Every day | ORAL | Status: DC | PRN

## 2016-01-13 MED ORDER — SODIUM CHLORIDE 0.9 % IR SOLN
Status: AC
  Filled 2016-01-13: qty 1

## 2016-01-13 MED ORDER — SODIUM CHLORIDE 0.9 % IR SOLN
Status: DC | PRN
  Administered 2016-01-13: 500 mL

## 2016-01-13 MED ORDER — RIVAROXABAN 10 MG PO TABS
10.0000 mg | ORAL_TABLET | Freq: Every day | ORAL | Status: DC
Start: 2016-01-14 — End: 2016-01-14
  Administered 2016-01-14: 10 mg via ORAL
  Filled 2016-01-13 (×2): qty 1

## 2016-01-13 MED ORDER — EPHEDRINE SULFATE 50 MG/ML IJ SOLN
INTRAMUSCULAR | Status: DC | PRN
  Administered 2016-01-13 (×2): 5 mg via INTRAVENOUS

## 2016-01-13 MED ORDER — ROCURONIUM BROMIDE 50 MG/5ML IV SOLN
INTRAVENOUS | Status: AC
Start: 2016-01-13 — End: 2016-01-13
  Filled 2016-01-13: qty 2

## 2016-01-13 MED ORDER — MIDAZOLAM HCL 2 MG/2ML IJ SOLN
INTRAMUSCULAR | Status: AC
  Filled 2016-01-13: qty 2

## 2016-01-13 MED ORDER — EPHEDRINE SULFATE 50 MG/ML IJ SOLN
INTRAMUSCULAR | Status: AC
  Filled 2016-01-13: qty 1

## 2016-01-13 SURGICAL SUPPLY — 60 items
BAG SPEC THK2 15X12 ZIP CLS (MISCELLANEOUS) ×1
BAG ZIPLOCK 12X15 (MISCELLANEOUS) ×2 IMPLANT
BANDAGE ACE 4X5 VEL STRL LF (GAUZE/BANDAGES/DRESSINGS) ×2 IMPLANT
BANDAGE ACE 6X5 VEL STRL LF (GAUZE/BANDAGES/DRESSINGS) ×2 IMPLANT
BIT DRILL 2.8X128 (BIT) ×2 IMPLANT
BNDG GAUZE ELAST 4 BULKY (GAUZE/BANDAGES/DRESSINGS) ×2 IMPLANT
CUFF TOURN SGL QUICK 34 (TOURNIQUET CUFF) ×2
CUFF TRNQT CYL 34X4X40X1 (TOURNIQUET CUFF) ×1 IMPLANT
DRAPE EXTREMITY T 121X128X90 (DRAPE) ×2 IMPLANT
DRAPE U-SHAPE 47X51 STRL (DRAPES) ×2 IMPLANT
DRSG ADAPTIC 3X8 NADH LF (GAUZE/BANDAGES/DRESSINGS) ×2 IMPLANT
DRSG KUZMA FLUFF (GAUZE/BANDAGES/DRESSINGS) ×1 IMPLANT
DRSG PAD ABDOMINAL 8X10 ST (GAUZE/BANDAGES/DRESSINGS) ×6 IMPLANT
DURAPREP 26ML APPLICATOR (WOUND CARE) ×2 IMPLANT
ELECT REM PT RETURN 9FT ADLT (ELECTROSURGICAL) ×2
ELECTRODE REM PT RTRN 9FT ADLT (ELECTROSURGICAL) ×1 IMPLANT
GAUZE SPONGE 4X4 12PLY STRL (GAUZE/BANDAGES/DRESSINGS) ×2 IMPLANT
GLOVE BIOGEL PI IND STRL 6.5 (GLOVE) IMPLANT
GLOVE BIOGEL PI IND STRL 7.5 (GLOVE) ×3 IMPLANT
GLOVE BIOGEL PI IND STRL 8 (GLOVE) ×1 IMPLANT
GLOVE BIOGEL PI INDICATOR 6.5 (GLOVE) ×1
GLOVE BIOGEL PI INDICATOR 7.5 (GLOVE) ×3
GLOVE BIOGEL PI INDICATOR 8 (GLOVE) ×1
GLOVE SURG SS PI 6.5 STRL IVOR (GLOVE) ×1 IMPLANT
GLOVE SURG SS PI 7.5 STRL IVOR (GLOVE) ×1 IMPLANT
GLOVE SURG SS PI 8.0 STRL IVOR (GLOVE) ×2 IMPLANT
GOWN STRL REUS W/TWL LRG LVL3 (GOWN DISPOSABLE) ×2 IMPLANT
GOWN STRL REUS W/TWL XL LVL3 (GOWN DISPOSABLE) ×4 IMPLANT
IMMOBILIZER KNEE 20 (SOFTGOODS) ×2
IMMOBILIZER KNEE 20 THIGH 36 (SOFTGOODS) ×1 IMPLANT
KIT BASIN OR (CUSTOM PROCEDURE TRAY) ×2 IMPLANT
MANIFOLD NEPTUNE II (INSTRUMENTS) ×2 IMPLANT
NDL MA TROC 1/2 CIR (NEEDLE) ×1 IMPLANT
NDL SPNL 18GX3.5 QUINCKE PK (NEEDLE) ×1 IMPLANT
NEEDLE HYPO 22GX1.5 SAFETY (NEEDLE) ×2 IMPLANT
NEEDLE MA TROC 1/2 CIR (NEEDLE) ×2 IMPLANT
NEEDLE SPNL 18GX3.5 QUINCKE PK (NEEDLE) ×2 IMPLANT
PACK TOTAL JOINT (CUSTOM PROCEDURE TRAY) ×2 IMPLANT
PASSER SUT SWANSON 36MM LOOP (INSTRUMENTS) ×2 IMPLANT
POSITIONER SURGICAL ARM (MISCELLANEOUS) ×2 IMPLANT
SPONGE LAP 18X18 X RAY DECT (DISPOSABLE) ×2 IMPLANT
STAPLER VISISTAT 35W (STAPLE) ×2 IMPLANT
STOCKINETTE 8 INCH (MISCELLANEOUS) ×2 IMPLANT
STRIP CLOSURE SKIN 1/2X4 (GAUZE/BANDAGES/DRESSINGS) ×2 IMPLANT
SUT ETHIBOND 2 OS 4 DA (SUTURE) ×4 IMPLANT
SUT ETHIBOND 5 LR DA (SUTURE) IMPLANT
SUT ETHIBOND NAB CT1 #1 30IN (SUTURE) ×2 IMPLANT
SUT FIBERWIRE #2 38 T-5 BLUE (SUTURE)
SUT MNCRL AB 4-0 PS2 18 (SUTURE) ×1 IMPLANT
SUT VIC AB 0 CT1 27 (SUTURE)
SUT VIC AB 0 CT1 27XBRD ANTBC (SUTURE) IMPLANT
SUT VIC AB 1 CT1 27 (SUTURE) ×4
SUT VIC AB 1 CT1 27XBRD ANTBC (SUTURE) IMPLANT
SUT VIC AB 2-0 CT1 27 (SUTURE) ×8
SUT VIC AB 2-0 CT1 27XBRD (SUTURE) IMPLANT
SUT VIC AB 2-0 CT1 TAPERPNT 27 (SUTURE) ×2 IMPLANT
SUTURE FIBERWR #2 38 T-5 BLUE (SUTURE) IMPLANT
SYR 30ML LL (SYRINGE) ×2 IMPLANT
TOWEL OR 17X26 10 PK STRL BLUE (TOWEL DISPOSABLE) ×4 IMPLANT
WRAP KNEE MAXI GEL POST OP (GAUZE/BANDAGES/DRESSINGS) ×1 IMPLANT

## 2016-01-13 NOTE — Anesthesia Postprocedure Evaluation (Signed)
Anesthesia Post Note  Patient: Jorge Peterson  Procedure(s) Performed: Procedure(s) (LRB): REPAIR QUADRICEP TENDON (Right)  Patient location during evaluation: PACU Anesthesia Type: General and Regional Level of consciousness: sedated and patient cooperative Pain management: pain level controlled Vital Signs Assessment: post-procedure vital signs reviewed and stable Respiratory status: spontaneous breathing Cardiovascular status: stable Anesthetic complications: no    Last Vitals:  Filed Vitals:   01/13/16 1715 01/13/16 1734  BP: 130/77 147/79  Pulse: 72 62  Temp:  36.7 C  Resp: 15 16    Last Pain:  Filed Vitals:   01/13/16 1735  PainSc: 0-No pain                 Nolon Nations

## 2016-01-13 NOTE — Anesthesia Preprocedure Evaluation (Signed)
Anesthesia Evaluation  Patient identified by MRN, date of birth, ID band Patient awake    Reviewed: Allergy & Precautions, NPO status , Patient's Chart, lab work & pertinent test results, reviewed documented beta blocker date and time   Airway Mallampati: I  TM Distance: >3 FB Neck ROM: Full    Dental  (+) Dental Advisory Given, Teeth Intact   Pulmonary neg pulmonary ROS,    breath sounds clear to auscultation       Cardiovascular hypertension, Pt. on medications and Pt. on home beta blockers + dysrhythmias  Rhythm:Regular Rate:Normal     Neuro/Psych negative neurological ROS  negative psych ROS   GI/Hepatic negative GI ROS, Neg liver ROS, GERD  Medicated and Controlled,  Endo/Other  diabetes  Renal/GU negative Renal ROS     Musculoskeletal negative musculoskeletal ROS (+)   Abdominal   Peds  Hematology negative hematology ROS (+)   Anesthesia Other Findings   Reproductive/Obstetrics                             Anesthesia Physical  Anesthesia Plan  ASA: II  Anesthesia Plan: General and Regional   Post-op Pain Management: GA combined w/ Regional for post-op pain   Induction: Intravenous  Airway Management Planned: LMA  Additional Equipment:   Intra-op Plan:   Post-operative Plan: Extubation in OR  Informed Consent: I have reviewed the patients History and Physical, chart, labs and discussed the procedure including the risks, benefits and alternatives for the proposed anesthesia with the patient or authorized representative who has indicated his/her understanding and acceptance.   Dental advisory given  Plan Discussed with: CRNA, Anesthesiologist and Surgeon  Anesthesia Plan Comments:         Anesthesia Quick Evaluation

## 2016-01-13 NOTE — Transfer of Care (Signed)
Immediate Anesthesia Transfer of Care Note  Patient: Jorge Peterson  Procedure(s) Performed: Procedure(s) with comments: REPAIR QUADRICEP TENDON (Right) - Received femoral nerve block in holding  Patient Location: PACU  Anesthesia Type:GA combined with regional for post-op pain  Level of Consciousness:  sedated, patient cooperative and responds to stimulation  Airway & Oxygen Therapy:Patient Spontanous Breathing and Patient connected to face mask oxgen  Post-op Assessment:  Report given to PACU RN and Post -op Vital signs reviewed and stable  Post vital signs:  Reviewed and stable  Last Vitals:  Filed Vitals:   01/13/16 1301 01/13/16 1623  BP: 157/92 124/66  Pulse: 58 74  Temp: 36.6 C 36.4 C  Resp: 16 14    Complications: No apparent anesthesia complications

## 2016-01-13 NOTE — Brief Op Note (Signed)
01/13/2016  3:56 PM  PATIENT:  Jorge Peterson  55 y.o. male  PRE-OPERATIVE DIAGNOSIS:  RIGHT QUAD TENDON RUPTURE  POST-OPERATIVE DIAGNOSIS:  RIGHT QUAD TENDON RUPTURE  PROCEDURE:  Procedure(s): REPAIR QUADRICEP TENDON (Right)  SURGEON:  Surgeon(s) and Role:    * Latanya Maudlin, MD - Primary  PHYSICIAN ASSISTANT: Ardeen Jourdain PA  ASSISTANTS:Amber Baraboo PA  ANESTHESIA:   general and Adductor Block on the Right  EBL:  Total I/O In: 700 [I.V.:700] Out: 50 [Blood:50]  BLOOD ADMINISTERED:none  DRAINS: none   LOCAL MEDICATIONS USED:  BUPIVICAINE 20cc of Exparel mixed with 10cc of Normal Saline  SPECIMEN:  No Specimen  DISPOSITION OF SPECIMEN:  N/A  COUNTS:  YES  TOURNIQUET:  * Missing tourniquet times found for documented tourniquets in log:  [id number] *  DICTATION: .Other Dictation: Dictation Number [id number]  PLAN OF CARE: Admit for overnight observation  PATIENT DISPOSITION:  Stable in OR   Delay start of Pharmacological VTE agent (>24hrs) due to surgical blood loss or risk of bleeding: yes

## 2016-01-13 NOTE — Progress Notes (Signed)
AssistedDr. Lissa Hoard with right ultrasound guided, femoral block. Side rails up, monitors on throughout procedure. See vital signs in flow sheet. Tolerated Procedure well.

## 2016-01-13 NOTE — Interval H&P Note (Signed)
History and Physical Interval Note:  01/13/2016 3:10 PM  Jorge Peterson  has presented today for surgery, with the diagnosis of RIGHT QUAD TENDON RUPTURE  The various methods of treatment have been discussed with the patient and family. After consideration of risks, benefits and other options for treatment, the patient has consented to  Procedure(s): REPAIR QUADRICEP TENDON (Right) as a surgical intervention .  The patient's history has been reviewed, patient examined, no change in status, stable for surgery.  I have reviewed the patient's chart and labs.  Questions were answered to the patient's satisfaction.     Yeraldin Litzenberger A

## 2016-01-13 NOTE — Anesthesia Procedure Notes (Signed)
Anesthesia Regional Block:  Femoral nerve block  Pre-Anesthetic Checklist: ,, timeout performed, Correct Patient, Correct Site, Correct Laterality, Correct Procedure, Correct Position, site marked, Risks and benefits discussed, Surgical consent,  Pre-op evaluation,  Post-op pain management  Laterality: Right  Prep: chloraprep       Needles:  Injection technique: Single-shot  Needle Type: Stimulator Needle - 80     Needle Length: 9cm 9 cm Needle Gauge: 22 and 22 G  Needle insertion depth: 6 cm   Additional Needles:  Procedures: ultrasound guided (picture in chart) and nerve stimulator Femoral nerve block  Nerve Stimulator or Paresthesia:  Response: Patellar snap, 0.5 mA,   Additional Responses:   Narrative:  Injection made incrementally with aspirations every 5 mL.  Performed by: Personally  Anesthesiologist: Nolon Nations  Additional Notes: BP cuff, EKG monitors applied. Sedation begun. Femoral artery palpated for location of nerve. After nerve location verified with U/S, anesthetic injected incrementally, slowly, and after negative aspirations under direct u/s guidance. Good perineural spread. Patient tolerated well.

## 2016-01-13 NOTE — H&P (Signed)
Jorge Peterson is an 55 y.o. male.   Chief Complaint: right thigh pain HPI: The patient is a 55 year old male who presented with the chief complaint of right thigh pain. He injured his leg when playing volleyball about 2 weeks ago. He reports swelling, pain, decreased strength and ROM. MRI showed a complete rupture of the quadriceps tendon on the right thigh.   Past Medical History  Diagnosis Date  . Atrial fibrillation ([place])     s/p afib ablation 7/12  . Hyperlipidemia   . Bradycardia     asymptomatic  . Diverticulitis   . Hypertension   . GERD (gastroesophageal reflux disease)   . Family history of anesthesia complication     father gets pneumonia after "  . Diabetes ([place])   . Internal hemorrhoids   . Tubular adenoma of colon   . Dysrhythmia   . Wears glasses   . Tinnitus   . Fall     Past Surgical History  Procedure Laterality Date  . Shoulder surgery      x 2  . Back surgery    . Atrial ablation surgery  03/24/11    PVI by JA  . Fistula surgery    . Tonsillectomy    . Bronchoscopy  01/23/2013     biopsy  . Video bronchoscopy with endobronchial ultrasound N/A 01/23/2013    Procedure: VIDEO BRONCHOSCOPY WITH ENDOBRONCHIAL ULTRASOUND;  Surgeon: Ivin Poot, MD;  Location: [place];  Service: Thoracic;  Laterality: N/A;  . Mediastinoscopy N/A 01/23/2013    Procedure: MEDIASTINOSCOPY;  Surgeon: Ivin Poot, MD;  Location: [place];  Service: Thoracic;  Laterality: N/A;  . Cholecystectomy  05/02/2014    LAP CHOLE  . Cholecystectomy N/A 05/02/2014    Procedure: LAPAROSCOPIC CHOLECYSTECTOMY WITH INTRAOPERATIVE CHOLANGIOGRAM;  Surgeon: Erroll Luna, MD;  Location: [place];  Service: General;  Laterality: N/A;  . Ercp N/A 05/25/2014    Procedure: ENDOSCOPIC RETROGRADE CHOLANGIOPANCREATOGRAPHY (ERCP);  Surgeon: [name], MD;  Location: [place];  Service: Endoscopy;  Laterality: N/A;  . Ep implantable device N/A 06/27/2015    Procedure: Loop Recorder Insertion;   Surgeon: [name], MD;  Location: [facility];  Service: Cardiovascular;  Laterality: N/A;    Family History  Problem Relation Age of Onset  . Diabetes Father   . Heart disease Father     CABG at 66   Social History:  reports that he has never smoked. He has never used smokeless tobacco. He reports that he does not drink alcohol or use illicit drugs.  Allergies:  Allergies  Allergen Reactions  . Latex Itching and Rash  . Oxycodone-Acetaminophen Itching, Rash and Other (See Comments)    Pt knows this as PERCOCET    No current facility-administered medications for this encounter Current outpatient prescriptions:  .  baclofen (LIORESAL) 10 MG tablet, Take 1 tablet (10 mg total) by mouth 3 (three) times daily., Disp: 30 tablet, Rfl: 0 .  Biotin 1000 MCG tablet, Take 1,000 mcg by mouth daily., Disp: , Rfl:  .  Coenzyme Q10 (COQ10) 150 MG CAPS, Take 150 mg by mouth daily. , Disp: , Rfl:  .  fish oil-omega-3 fatty acids 1000 MG capsule, Take 1-2 g by mouth 2 (two) times daily. Take two capsules in the morning and one capsule in the evening., Disp: , Rfl:  .  flecainide (TAMBOCOR) 150 MG tablet, Take 0.5 tablets (75 mg total) by mouth 2 (two) times daily., Disp: 30  tablet, Rfl: 6 .  fluticasone (FLONASE) 50 MCG/ACT nasal spray, Place 1 spray into the nose daily as needed for allergies. , Disp: , Rfl:  .  folic acid (FOLVITE) 846 MCG tablet, Take 400 mcg by mouth daily.  , Disp: , Rfl:  .  hydrochlorothiazide (MICROZIDE) 12.5 MG capsule, Take 12.5 mg by mouth daily., Disp: , Rfl:  .  losartan (COZAAR) 100 MG tablet, Take 100 mg by mouth daily., Disp: , Rfl:  .  metoprolol succinate (TOPROL-XL) 25 MG 24 hr tablet, Take 1 tablet (25 mg total) by mouth daily. (Patient taking differently: Take 25 mg by mouth every evening. ), Disp: 30 tablet, Rfl: 3 .  Multiple Vitamin (MULTIVITAMIN) tablet, Take 1 tablet by mouth daily. , Disp: , Rfl:  .  omeprazole (PRILOSEC) 40 MG capsule, TAKE 1  CAPSULE (40 MG TOTAL) BY MOUTH DAILY., Disp: 90 capsule, Rfl: 1 .  Probiotic Product (PROBIOTIC FORMULA PO), Take 1 capsule by mouth daily., Disp: , Rfl:  .  rivaroxaban (XARELTO) 20 MG TABS tablet, Take 1 tablet (20 mg total) by mouth daily with supper., Disp: 30 tablet, Rfl: 11 .  rosuvastatin (CRESTOR) 10 MG tablet, Take 1 tablet (10 mg total) by mouth daily., Disp: 90 tablet, Rfl: 3 .  traMADol (ULTRAM) 50 MG tablet, Take 50 mg by mouth every 4 (four) hours as needed (For pain.). , Disp: , Rfl: 0 .  OxyCODONE HCl, Abuse Deter, (OXAYDO) 5 MG TABA, Take 5 mg by mouth 3 (three) times daily. (Patient not taking: Reported on 01/09/2016), Disp: 12 tablet, Rfl: 0  Results for orders placed or performed during the hospital encounter of 01/12/16 (from the past 48 hour(s))  APTT     Status: None   Collection Time: 01/12/16  9:00 AM  Result Value Ref Range   aPTT 28 24 - 37 seconds  CBC WITH DIFFERENTIAL     Status: None   Collection Time: 01/12/16  9:00 AM  Result Value Ref Range   WBC 6.5 4.0 - 10.5 K/uL   RBC 5.08 4.22 - 5.81 MIL/uL   Hemoglobin 14.0 13.0 - 17.0 g/dL   HCT 42.5 39.0 - 52.0 %   MCV 83.7 78.0 - 100.0 fL   MCH 27.6 26.0 - 34.0 pg   MCHC 32.9 30.0 - 36.0 g/dL   RDW 12.7 11.5 - 15.5 %   Platelets 168 150 - 400 K/uL   Neutrophils Relative % 57 %   Neutro Abs 3.7 1.7 - 7.7 K/uL   Lymphocytes Relative 31 %   Lymphs Abs 2.1 0.7 - 4.0 K/uL   Monocytes Relative 9 %   Monocytes Absolute 0.6 0.1 - 1.0 K/uL   Eosinophils Relative 3 %   Eosinophils Absolute 0.2 0.0 - 0.7 K/uL   Basophils Relative 0 %   Basophils Absolute 0.0 0.0 - 0.1 K/uL  Comprehensive metabolic panel     Status: Abnormal   Collection Time: 01/12/16  9:00 AM  Result Value Ref Range   Sodium 139 135 - 145 mmol/L   Potassium 4.5 3.5 - 5.1 mmol/L   Chloride 101 101 - 111 mmol/L   CO2 30 22 - 32 mmol/L   Glucose, Bld 170 (H) 65 - 99 mg/dL   BUN 20 6 - 20 mg/dL   Creatinine, Ser 1.16 0.61 - 1.24 mg/dL   Calcium  9.6 8.9 - 10.3 mg/dL   Total Protein 6.8 6.5 - 8.1 g/dL   Albumin 4.4 3.5 - 5.0 g/dL  AST 38 15 - 41 U/L   ALT 82 (H) 17 - 63 U/L   Alkaline Phosphatase 82 38 - 126 U/L   Total Bilirubin 0.7 0.3 - 1.2 mg/dL   GFR calc non Af Amer >60 >60 mL/min   GFR calc Af Amer >60 >60 mL/min    Comment: (NOTE) The eGFR has been calculated using the CKD EPI equation. This calculation has not been validated in all clinical situations. eGFR's persistently <60 mL/min signify possible Chronic Kidney Disease.    Anion gap 8 5 - 15  Protime-INR     Status: None   Collection Time: 01/12/16  9:00 AM  Result Value Ref Range   Prothrombin Time 13.3 11.6 - 15.2 seconds   INR 0.99 0.00 - 1.49  Type and screen Order type and screen if day of surgery is less than 15 days from draw of preadmission visit or order morning of surgery if day of surgery is greater than 6 days from preadmission visit.     Status: None   Collection Time: 01/12/16  9:00 AM  Result Value Ref Range   ABO/RH(D) O POS    Antibody Screen NEG    Sample Expiration 01/16/2016    Extend sample reason NO TRANSFUSIONS OR PREGNANCY IN THE PAST 3 MONTHS   ABO/Rh     Status: None   Collection Time: 01/12/16  9:30 AM  Result Value Ref Range   ABO/RH(D) O POS    Dg Chest 2 View  01/12/2016  CLINICAL DATA:  Preop EXAM: CHEST  2 VIEW COMPARISON:  04/30/2014 FINDINGS: Normal heart size. Lungs are under aerated and clear. No pneumothorax. No pleural effusion. IMPRESSION: No active cardiopulmonary disease. Electronically Signed   By: Marybelle Killings M.D.   On: 01/12/2016 09:30    Review of Systems  Constitutional: Negative.   HENT: Negative.   Eyes: Negative.   Respiratory: Negative.   Cardiovascular: Positive for palpitations. Negative for chest pain, orthopnea, claudication, leg swelling and PND.  Gastrointestinal: Negative.   Genitourinary: Negative.   Musculoskeletal: Positive for myalgias and joint pain. Negative for back pain, falls and neck  pain.  Skin: Negative.   Neurological: Negative.   Endo/Heme/Allergies: Negative.   Psychiatric/Behavioral: Negative.     Vitals  Weight: 230 lb Height: 67in Body Surface Area: 2.15 m Body Mass Index: 36.02 kg/m  BP: 138/76 HR: 64 bpm   Physical Exam  Constitutional: He is oriented to person, place, and time. He appears well-developed. No distress.  Obese  HENT:  Head: Normocephalic and atraumatic.  Right Ear: External ear normal.  Left Ear: External ear normal.  Nose: Nose normal.  Mouth/Throat: Oropharynx is clear and moist.  Eyes: Conjunctivae and EOM are normal.  Neck: Normal range of motion. Neck supple.  Cardiovascular: Normal rate, regular rhythm, normal heart sounds and intact distal pulses.   No murmur heard. Respiratory: Effort normal and breath sounds normal. No respiratory distress. He has no wheezes.  GI: Soft. Bowel sounds are normal. He exhibits no distension. There is no tenderness.  Musculoskeletal:       Right hip: Normal.       Left hip: Normal.       Right knee: He exhibits decreased range of motion, swelling, effusion and bony tenderness. No medial joint line and no lateral joint line tenderness noted.       Left knee: Normal.  Neurological: He is alert and oriented to person, place, and time. He has normal strength and normal reflexes.  No sensory deficit.  Skin: No rash noted. He is not diaphoretic. No erythema.  Psychiatric: He has a normal mood and affect. His behavior is normal.     Assessment/Plan Right quadriceps tendon rupture He has a rupture of his right quadriceps tendon. He will need a repair of the quadriceps tendon with possible reinforcement with a graft. Risks and benefits of the procedure discussed with the patient by Dr. Latanya Maudlin.   H&P performed by Dr. Latanya Maudlin, MD Documented by Ardeen Jourdain, PA-C  Viviene Thurston Ander Purpura, PA-C 01/13/2016, 8:59 AM

## 2016-01-14 ENCOUNTER — Encounter (HOSPITAL_COMMUNITY): Payer: Self-pay | Admitting: Orthopedic Surgery

## 2016-01-14 DIAGNOSIS — S76111A Strain of right quadriceps muscle, fascia and tendon, initial encounter: Secondary | ICD-10-CM | POA: Diagnosis not present

## 2016-01-14 LAB — BASIC METABOLIC PANEL
ANION GAP: 7 (ref 5–15)
BUN: 20 mg/dL (ref 6–20)
CHLORIDE: 104 mmol/L (ref 101–111)
CO2: 27 mmol/L (ref 22–32)
Calcium: 9.2 mg/dL (ref 8.9–10.3)
Creatinine, Ser: 1.1 mg/dL (ref 0.61–1.24)
GFR calc Af Amer: >60 mL/min (ref >60)
GFR calc non Af Amer: >60 mL/min (ref >60)
GLUCOSE: 209 mg/dL — AB (ref 65–99)
POTASSIUM: 4.2 mmol/L (ref 3.5–5.1)
SODIUM: 138 mmol/L (ref 135–145)

## 2016-01-14 LAB — GLUCOSE, CAPILLARY
GLUCOSE-CAPILLARY: 189 mg/dL — AB (ref 65–99)
GLUCOSE-CAPILLARY: 161 mg/dL — AB (ref 65–99)

## 2016-01-14 MED ORDER — HYDROMORPHONE HCL 2 MG PO TABS: 2.0000 mg | ORAL_TABLET | ORAL | Status: DC | PRN

## 2016-01-14 MED ORDER — TRAMADOL HCL 50 MG PO TABS: 50.0000 mg | ORAL_TABLET | ORAL | Status: DC | PRN

## 2016-01-14 MED ORDER — METHOCARBAMOL 500 MG PO TABS: 500.0000 mg | ORAL_TABLET | Freq: Four times a day (QID) | ORAL | Status: DC | PRN

## 2016-01-14 NOTE — Evaluation (Signed)
Physical Therapy Evaluation Patient Details Name: Jorge Peterson MRN: [id number] DOB: [birth year] Today's Date: 01/14/2016   History of Present Illness  repair R quad tendon rupture 01/13/16  Clinical Impression  The patient is mobilizing well with crutches.  Reviewed precautions and use of KI. Ready for DC. Still some numbness about the thigh. RN aware.    Follow Up Recommendations No PT follow up    Equipment Recommendations  None recommended by PT    Recommendations for Other Services       Precautions / Restrictions Precautions Precautions: Knee Required Braces or Orthoses: Knee Immobilizer - Right Knee Immobilizer - Right: On at all times      Mobility  Bed Mobility Overal bed mobility: Modified Independent                Transfers Overall transfer level: Modified independent                  Ambulation/Gait Ambulation/Gait assistance: Modified independent (Device/Increase time) Ambulation Distance (Feet): 200 Feet Assistive device: Crutches Gait Pattern/deviations: Step-to pattern     General Gait Details: safe with crutches  Stairs Stairs: Yes Stairs assistance: Supervision Stair Management: One rail Right;Step to pattern;Sideways;With crutches Number of Stairs: 2 General stair comments: patient is safe on crutches  Wheelchair Mobility    Modified Rankin (Stroke Patients Only)       Balance Overall balance assessment: Needs assistance         Standing balance support: During functional activity;Bilateral upper extremity supported Standing balance-Leahy Scale: Good                               Pertinent Vitals/Pain Pain Assessment: No/denies pain    Home Living Family/patient expects to be discharged to:: Private residence Living Arrangements: Spouse/significant other Available Help at Discharge: Family Type of Home: House Home Access: Stairs to enter Entrance Stairs-Rails: Psychiatric nurse  of Steps: 4 Home Layout: One level Home Equipment: Crutches      Prior Function Level of Independence: Independent with assistive device(s)               Hand Dominance        Extremity/Trunk Assessment   Upper Extremity Assessment: Overall WFL for tasks assessed           Lower Extremity Assessment: RLE deficits/detail RLE Deficits / Details: KI in place    Cervical / Trunk Assessment: Normal  Communication   Communication: No difficulties  Cognition Arousal/Alertness: Awake/alert Behavior During Therapy: WFL for tasks assessed/performed Overall Cognitive Status: Within Functional Limits for tasks assessed                      General Comments      Exercises        Assessment/Plan    PT Assessment Patent does not need any further PT services  PT Diagnosis Abnormality of gait   PT Problem List    PT Treatment Interventions     PT Goals (Current goals can be found in the Care Plan section) Acute Rehab PT Goals Patient Stated Goal: to officiate a wedding saturdey PT Goal Formulation: All assessment and education complete, DC therapy    Frequency     Barriers to discharge        Co-evaluation               End of Session Equipment Utilized During Treatment: Right  knee immobilizer Activity Tolerance: Patient tolerated treatment well Patient left: in bed;with call bell/phone within reach;with family/visitor present Nurse Communication: Mobility status    Functional Assessment Tool Used: clinical judgement Functional Limitation: Mobility: Walking and moving around Mobility: Walking and Moving Around Current Status [id number]): At least 1 percent but less than 20 percent impaired, limited or restricted Mobility: Walking and Moving Around Goal Status [phone]): At least 1 percent but less than 20 percent impaired, limited or restricted Mobility: Walking and Moving Around Discharge Status [phone]): At least 1 percent but less than 20 percent  impaired, limited or restricted    Time: 0919-0943 PT Time Calculation (min) (ACUTE ONLY): 24 min   Charges:   PT Evaluation $PT Eval Low Complexity: 1 Procedure PT Treatments $Gait Training: 8-22 mins   PT G Codes:   PT G-Codes **NOT FOR INPATIENT CLASS** Functional Assessment Tool Used: clinical judgement Functional Limitation: Mobility: Walking and moving around Mobility: Walking and Moving Around Current Status [id number]): At least 1 percent but less than 20 percent impaired, limited or restricted Mobility: Walking and Moving Around Goal Status [phone]): At least 1 percent but less than 20 percent impaired, limited or restricted Mobility: Walking and Moving Around Discharge Status [phone]): At least 1 percent but less than 20 percent impaired, limited or restricted    Claretha Cooper 01/14/2016, 10:01 AM

## 2016-01-14 NOTE — Discharge Instructions (Signed)
Change your dressing daily with gauze and paper tape Shower only, no tub bath. Wear knee immobilizer at all times except when showering.  Do not bend the knee while showering.  Call if any temperatures greater than 101 or any wound complications: [phone] during the day and ask for Dr. Charlestine Night nurse, Brunilda Payor.

## 2016-01-14 NOTE — Op Note (Signed)
NAME:  Jorge Peterson, Jorge Peterson              ACCOUNT NO.:  [id number]  MEDICAL RECORD NO.:  [id number]  LOCATION:  [id number]                         FACILITY:  [facility]  PHYSICIAN:  Kipp Brood. Sherrica Niehaus, M.D.DATE OF BIRTH:  Jun 04, 1961  DATE OF PROCEDURE:  01/13/2016 DATE OF DISCHARGE:                              OPERATIVE REPORT   SURGEON:  Kipp Brood. Gladstone Lighter, M.D.  OPERATIVE ASSISTANT:  Ardeen Jourdain, PA.  PREOPERATIVE DIAGNOSIS:  Complete rupture of the central portion of the right quadriceps muscle.  Note, this was proven by MRI.  He was unable to extend his knee on several exams, all on the right side.  POSTOPERATIVE DIAGNOSIS:  Complete rupture of the central portion of the right quadriceps muscle.  Note, this was proven by MRI.  He was unable to extend his knee on several exams, all on the right side.  OPERATION:  Open repair of a complete disruption of the right quadriceps tendon.  Note, the appropriate time-out was first carried out, I also marked the appropriate right leg in the holding area.  Initially in the holding area, he had an adductor block by Anesthesia.  He was then brought back to the operating room.  A sterile prep and draping of the right lower extremity was carried out.  Tourniquet was applied and elevated to 325 mmHg.  An incision was made over the anterior aspect of the left knee after we exsanguinated the leg with an Esmarch and elevated the tourniquet.  Two flaps were created.  I then went down and noted immediately had a complete disruption of the central portion of the quadriceps tendon.  Fortunately, it was not from off the patella.  We were able to get both ends of the tendon back nicely and sutured those in place with a #1 Ethibond suture.  We then repaired another defect laterally.  There was a small defect medially as well that was repaired. Note, the fortunate thing is that the patellar tendon did not come off the patella so no drill hole was necessary in the  patella.  We then did a secondary repair after the primary repair utilizing #1 Vicryl to close up the soft tissue over that area.  Note, we then closed the remaining part of the wound layers in usual fashion with a running locking suture. We did irrigate the knee with antibiotic solution and did looking into the knee joint as well at the time of surgery.  Sterile dressings were applied.  He was placed in a knee immobilizer.  Note, he had 2 g of IV Ancef preop.          ______________________________ Kipp Brood. Gladstone Lighter, M.D.     RAG/MEDQ  D:  01/13/2016  T:  01/14/2016  Job:  [id number]

## 2016-01-14 NOTE — Care Management Note (Signed)
Case Management Note  Patient Details  Name: Jorge Peterson MRN: [id number] Date of Birth: 04-01-1961  Subjective/Objective:   S/p Open repair of a complete disruption of the right quadriceps tendon.                 Action/Plan: Discharge planning, no HH needs identified  Expected Discharge Date:  01/14/16               Expected Discharge Plan:  Home/Self Care  In-House Referral:  NA  Discharge planning Services  CM Consult  Post Acute Care Choice:  NA Choice offered to:  NA  DME Arranged:  N/A DME Agency:  NA  HH Arranged:  NA HH Agency:  NA  Status of Service:  Completed, signed off  Medicare Important Message Given:    Date Medicare IM Given:    Medicare IM give by:    Date Additional Medicare IM Given:    Additional Medicare Important Message give by:     If discussed at [place] of Stay Meetings, dates discussed:    Additional Comments:  Guadalupe Maple, RN 01/14/2016, 9:56 AM

## 2016-01-14 NOTE — Progress Notes (Signed)
Subjective: 1 Day Post-Op Procedure(s) (LRB): REPAIR QUADRICEP TENDON (Right) Patient reports pain as 2 on 0-10 scale.    Objective: Vital signs in last 24 hours: Temp:  [97.5 F (36.4 C)-98.6 F (37 C)] 98 F (36.7 C) (05/17 1021) Pulse Rate:  [58-85] 81 (05/17 1021) Resp:  [14-18] 16 (05/17 1021) BP: (106-157)/(61-92) 106/61 mmHg (05/17 1021) SpO2:  [92 %-100 %] 92 % (05/17 1021) Weight:  [105.235 kg (232 lb)] 105.235 kg (232 lb) (05/16 1337)  Intake/Output from previous day: 05/16 0701 - 05/17 0700 In: 1760 [P.O.:960; I.V.:800] Out: 1460 [Urine:1410; Blood:50] Intake/Output this shift: Total I/O In: 240 [P.O.:240] Out: -    Recent Labs  01/12/16 0900  HGB 14.0    Recent Labs  01/12/16 0900  WBC 6.5  RBC 5.08  HCT 42.5  PLT 168    Recent Labs  01/12/16 0900 01/14/16 0416  NA 139 138  K 4.5 4.2  CL 101 104  CO2 30 27  BUN 20 20  CREATININE 1.16 1.10  GLUCOSE 170* 209*  CALCIUM 9.6 9.2    Recent Labs  01/12/16 0900  INR 0.99    Neurologically intact  Assessment/Plan: 1 Day Post-Op Procedure(s) (LRB): REPAIR QUADRICEP TENDON (Right) Up with therapy Discharge home with home health  Gabrielle Wakeland A 01/14/2016, 10:53 AM

## 2016-01-15 NOTE — Telephone Encounter (Signed)
Proceed with surgery if medically indicated without further CV workup.

## 2016-01-16 NOTE — Discharge Summary (Signed)
Physician Discharge Summary   Patient ID: Jorge Peterson MRN: [id number] DOB/AGE: [birth year] 55 y.o.  Admit date: 01/13/2016 Discharge date: 01/14/2016  Primary Diagnosis: Right quadriceps tendon rupture  Admission Diagnoses:  Past Medical History  Diagnosis Date  . Atrial fibrillation ([place])     s/p afib ablation 7/12  . Hyperlipidemia   . Bradycardia     asymptomatic  . Diverticulitis   . Hypertension   . GERD (gastroesophageal reflux disease)   . Family history of anesthesia complication     father gets pneumonia after "  . Diabetes ([place])   . Internal hemorrhoids   . Tubular adenoma of colon   . Dysrhythmia   . Wears glasses   . Tinnitus   . Fall    Discharge Diagnoses:   Active Problems:   Quadriceps tendon rupture  Estimated body mass index is 36.33 kg/(m^2) as calculated from the following:   Height as of this encounter: _0  (1.702 m).   Weight as of this encounter: 105.235 kg (232 lb).  Procedure:  Procedure(s) (LRB): REPAIR QUADRICEP TENDON (Right)   Consults: None  HPI: The patient is a 55 year old male who presented with the chief complaint of right thigh pain. He injured his leg when playing volleyball about 2 weeks ago. He reports swelling, pain, decreased strength and ROM. MRI showed a complete rupture of the quadriceps tendon on the right thigh.   Laboratory Data: Admission on 01/13/2016, Discharged on 01/14/2016  Component Date Value Ref Range Status  . Glucose-Capillary 01/13/2016 105* 65 - 99 mg/dL Final  . Sodium 01/14/2016 138  135 - 145 mmol/L Final  . Potassium 01/14/2016 4.2  3.5 - 5.1 mmol/L Final  . Chloride 01/14/2016 104  101 - 111 mmol/L Final  . CO2 01/14/2016 27  22 - 32 mmol/L Final  . Glucose, Bld 01/14/2016 209* 65 - 99 mg/dL Final  . BUN 01/14/2016 20  6 - 20 mg/dL Final  . Creatinine, Ser 01/14/2016 1.10  0.61 - 1.24 mg/dL Final  . Calcium 01/14/2016 9.2  8.9 - 10.3 mg/dL Final  . GFR calc non Af Amer 01/14/2016 >60  >60  mL/min Final  . GFR calc Af Amer 01/14/2016 >60  >60 mL/min Final   Comment: (NOTE) The eGFR has been calculated using the CKD EPI equation. This calculation has not been validated in all clinical situations. eGFR's persistently <60 mL/min signify possible Chronic Kidney Disease.   . Anion gap 01/14/2016 7  5 - 15 Final  . Glucose-Capillary 01/14/2016 189* 65 - 99 mg/dL Final  . Glucose-Capillary 01/14/2016 161* 65 - 99 mg/dL Final  Hospital Outpatient Visit on 01/12/2016  Component Date Value Ref Range Status  . aPTT 01/12/2016 28  24 - 37 seconds Final  . WBC 01/12/2016 6.5  4.0 - 10.5 K/uL Final  . RBC 01/12/2016 5.08  4.22 - 5.81 MIL/uL Final  . Hemoglobin 01/12/2016 14.0  13.0 - 17.0 g/dL Final  . HCT 01/12/2016 42.5  39.0 - 52.0 % Final  . MCV 01/12/2016 83.7  78.0 - 100.0 fL Final  . MCH 01/12/2016 27.6  26.0 - 34.0 pg Final  . MCHC 01/12/2016 32.9  30.0 - 36.0 g/dL Final  . RDW 01/12/2016 12.7  11.5 - 15.5 % Final  . Platelets 01/12/2016 168  150 - 400 K/uL Final  . Neutrophils Relative % 01/12/2016 57   Final  . Neutro Abs 01/12/2016 3.7  1.7 - 7.7 K/uL Final  . Lymphocytes Relative 01/12/2016  31   Final  . Lymphs Abs 01/12/2016 2.1  0.7 - 4.0 K/uL Final  . Monocytes Relative 01/12/2016 9   Final  . Monocytes Absolute 01/12/2016 0.6  0.1 - 1.0 K/uL Final  . Eosinophils Relative 01/12/2016 3   Final  . Eosinophils Absolute 01/12/2016 0.2  0.0 - 0.7 K/uL Final  . Basophils Relative 01/12/2016 0   Final  . Basophils Absolute 01/12/2016 0.0  0.0 - 0.1 K/uL Final  . Sodium 01/12/2016 139  135 - 145 mmol/L Final  . Potassium 01/12/2016 4.5  3.5 - 5.1 mmol/L Final  . Chloride 01/12/2016 101  101 - 111 mmol/L Final  . CO2 01/12/2016 30  22 - 32 mmol/L Final  . Glucose, Bld 01/12/2016 170* 65 - 99 mg/dL Final  . BUN 01/12/2016 20  6 - 20 mg/dL Final  . Creatinine, Ser 01/12/2016 1.16  0.61 - 1.24 mg/dL Final  . Calcium 01/12/2016 9.6  8.9 - 10.3 mg/dL Final  . Total Protein  01/12/2016 6.8  6.5 - 8.1 g/dL Final  . Albumin 01/12/2016 4.4  3.5 - 5.0 g/dL Final  . AST 01/12/2016 38  15 - 41 U/L Final  . ALT 01/12/2016 82* 17 - 63 U/L Final  . Alkaline Phosphatase 01/12/2016 82  38 - 126 U/L Final  . Total Bilirubin 01/12/2016 0.7  0.3 - 1.2 mg/dL Final  . GFR calc non Af Amer 01/12/2016 >60  >60 mL/min Final  . GFR calc Af Amer 01/12/2016 >60  >60 mL/min Final   Comment: (NOTE) The eGFR has been calculated using the CKD EPI equation. This calculation has not been validated in all clinical situations. eGFR's persistently <60 mL/min signify possible Chronic Kidney Disease.   . Anion gap 01/12/2016 8  5 - 15 Final  . Prothrombin Time 01/12/2016 13.3  11.6 - 15.2 seconds Final  . INR 01/12/2016 0.99  0.00 - 1.49 Final  . ABO/RH(D) 01/12/2016 O POS   Final  . Antibody Screen 01/12/2016 NEG   Final  . Sample Expiration 01/12/2016 01/16/2016   Final  . Extend sample reason 01/12/2016 NO TRANSFUSIONS OR PREGNANCY IN THE PAST 3 MONTHS   Final  . ABO/RH(D) 01/12/2016 O POS   Final  Office Visit on 12/22/2015  Component Date Value Ref Range Status  . Date Time Interrogation Session 12/22/2015 [id number]   Final  . Pulse Generator Manufacturer 12/22/2015 MERM   Final  . Pulse Gen Model 12/22/2015 IOE70 Reveal LINQ   Final  . Pulse Gen Serial Number 12/22/2015 [id number]   Final  . Implantable Pulse Generator Type 12/22/2015 ICM/ILR   Final  . Implantable Pulse Generator Implan* 12/22/2015 [id number]   Final  . Battery Status 12/22/2015 OK   Final  . Eval Rhythm 12/22/2015 VS 54   Final     X-Rays:Dg Chest 2 View  01/12/2016  CLINICAL DATA:  Preop EXAM: CHEST  2 VIEW COMPARISON:  04/30/2014 FINDINGS: Normal heart size. Lungs are under aerated and clear. No pneumothorax. No pleural effusion. IMPRESSION: No active cardiopulmonary disease. Electronically Signed   By: Marybelle Killings M.D.   On: 01/12/2016 09:30   Dg Femur, Min 2 Views Right  12/23/2015   CLINICAL DATA:  Status post fall while playing volleyball today with right leg pain. EXAM: RIGHT FEMUR 2 VIEWS COMPARISON:  None. FINDINGS: There is no evidence of fracture or dislocation. Corticated chronic bony densities identified in the soft tissues of the superior right knee. Soft tissues are unremarkable. IMPRESSION: No  acute fracture or dislocation. Electronically Signed   By: Abelardo Diesel M.D.   On: 12/23/2015 13:12    EKG: Orders placed or performed in visit on 12/22/15  . EKG 12-Lead     Hospital Course: MARSALIS BEAULIEU is a 55 y.o. who was admitted to [facility]. They were brought to the operating room on 01/13/2016 and underwent Procedure(s): REPAIR QUADRICEP TENDON.  Patient tolerated the procedure well and was later transferred to the recovery room and then to the orthopaedic floor for postoperative care.  They were given PO and IV analgesics for pain control following their surgery.  They were given 24 hours of postoperative antibiotics of  Anti-infectives    Start     Dose/Rate Route Frequency Ordered Stop   01/14/16 0600  ceFAZolin (ANCEF) IVPB 2g/100 mL premix     2 g 200 mL/hr over 30 Minutes Intravenous On call to O.R. 01/13/16 1308 01/13/16 1524   01/13/16 2200  ceFAZolin (ANCEF) IVPB 1 g/50 mL premix     1 g 100 mL/hr over 30 Minutes Intravenous Every 6 hours 01/13/16 1734 01/14/16 1006   01/13/16 1536  polymyxin B 500,000 Units, bacitracin 50,000 Units in sodium chloride irrigation 0.9 % 500 mL irrigation  Status:  Discontinued       As needed 01/13/16 1604 01/13/16 1619     and started on DVT prophylaxis in the form of Xarelto.   PT was ordered.  Discharge planning consulted to help with postop disposition and equipment needs.  Patient had a fair night on the evening of surgery.  They started to get up OOB with therapy on day one. Dressing was changed and the incision was clean and dry. Incision was healing well.  Patient was seen in rounds and was ready to go  home.   Diet: Diabetic diet Activity:WBAT Follow-up:in 2 weeks Disposition - Home Discharged Condition: stable   Discharge Instructions    Call MD / Call 911    Complete by:  As directed   If you experience chest pain or shortness of breath, CALL 911 and be transported to the hospital emergency room.  If you develope a fever above 101 F, pus (white drainage) or increased drainage or redness at the wound, or calf pain, call your surgeon's office.     Constipation Prevention    Complete by:  As directed   Drink plenty of fluids.  Prune juice may be helpful.  You may use a stool softener, such as Colace (over the counter) 100 mg twice a day.  Use MiraLax (over the counter) for constipation as needed.     Diet - low sodium heart healthy    Complete by:  As directed      Discharge instructions    Complete by:  As directed   Change your dressing daily with gauze and paper tape Shower only, no tub bath. Wear knee immobilizer at all times except when showering.  Do not bend the knee while showering.  Call if any temperatures greater than 101 or any wound complications: [phone] during the day and ask for Dr. Charlestine Night nurse, Brunilda Payor.     Driving restrictions    Complete by:  As directed   No driving     Increase activity slowly as tolerated    Complete by:  As directed             Medication List    STOP taking these medications  baclofen 10 MG tablet  Commonly known as:  LIORESAL     OxyCODONE HCl (Abuse Deter) 5 MG Taba  Commonly known as:  OXAYDO      TAKE these medications        Biotin 1000 MCG tablet  Take 1,000 mcg by mouth daily.     COQ10 150 MG Caps  Take 150 mg by mouth daily.     fish oil-omega-3 fatty acids 1000 MG capsule  Take 1-2 g by mouth 2 (two) times daily. Take two capsules in the morning and one capsule in the evening.     flecainide 150 MG tablet  Commonly known as:  TAMBOCOR  Take 0.5 tablets (75 mg total) by mouth 2 (two) times  daily.     fluticasone 50 MCG/ACT nasal spray  Commonly known as:  FLONASE  Place 1 spray into the nose daily as needed for allergies.     folic acid 716 MCG tablet  Commonly known as:  FOLVITE  Take 400 mcg by mouth daily.     hydrochlorothiazide 12.5 MG capsule  Commonly known as:  MICROZIDE  Take 12.5 mg by mouth daily.     HYDROmorphone 2 MG tablet  Commonly known as:  DILAUDID  Take 1 tablet (2 mg total) by mouth every 4 (four) hours as needed for severe pain.     losartan 100 MG tablet  Commonly known as:  COZAAR  Take 100 mg by mouth daily.     methocarbamol 500 MG tablet  Commonly known as:  ROBAXIN  Take 1 tablet (500 mg total) by mouth every 6 (six) hours as needed for muscle spasms.     metoprolol succinate 25 MG 24 hr tablet  Commonly known as:  TOPROL-XL  Take 1 tablet (25 mg total) by mouth daily.     multivitamin tablet  Take 1 tablet by mouth daily.     omeprazole 40 MG capsule  Commonly known as:  PRILOSEC  TAKE 1 CAPSULE (40 MG TOTAL) BY MOUTH DAILY.     PROBIOTIC FORMULA PO  Take 1 capsule by mouth daily.     rivaroxaban 20 MG Tabs tablet  Commonly known as:  XARELTO  Take 1 tablet (20 mg total) by mouth daily with supper.     rosuvastatin 10 MG tablet  Commonly known as:  CRESTOR  Take 1 tablet (10 mg total) by mouth daily.     traMADol 50 MG tablet  Commonly known as:  ULTRAM  Take 1 tablet (50 mg total) by mouth every 4 (four) hours as needed for moderate pain (For pain.).           Follow-up Information    Follow up with GIOFFRE,RONALD A, MD In 2 weeks.   Specialty:  Orthopedic Surgery   Contact information:   [address] [place] [address] [phone]       Signed: Ardeen Jourdain, PA-C Orthopaedic Surgery 01/16/2016, 7:50 AM

## 2016-01-21 ENCOUNTER — Telehealth: Payer: Self-pay | Admitting: Internal Medicine

## 2016-01-21 NOTE — Telephone Encounter (Signed)
Pt states LFT's have been increasing, states PCP has followed them but told pt to see his GI doc. Pt scheduled to see Amy Esterwood PA 01/30/16@1 :30pm. Pt aware of appt.

## 2016-01-23 ENCOUNTER — Ambulatory Visit (INDEPENDENT_AMBULATORY_CARE_PROVIDER_SITE_OTHER): Payer: 59 | Admitting: *Deleted

## 2016-01-23 DIAGNOSIS — I480 Paroxysmal atrial fibrillation: Secondary | ICD-10-CM | POA: Diagnosis not present

## 2016-01-23 NOTE — Progress Notes (Signed)
Carelink Summary Report / Loop Recorder 

## 2016-01-24 LAB — CUP PACEART REMOTE DEVICE CHECK: MDC IDC SESS DTM: 20170327181028

## 2016-01-24 NOTE — Progress Notes (Signed)
Carelink summary report received. Battery status OK. Normal device function. No new symptom episodes, tachy episodes. 3.9% AF burden, V rates controlled, +OAC.  Chronic nocturnal bradycardia.  Monthly summary reports and ROV/PRN

## 2016-01-26 LAB — CUP PACEART REMOTE DEVICE CHECK: Date Time Interrogation Session: 20170426123539

## 2016-01-30 ENCOUNTER — Encounter: Payer: Self-pay | Admitting: Physician Assistant

## 2016-01-30 ENCOUNTER — Other Ambulatory Visit (INDEPENDENT_AMBULATORY_CARE_PROVIDER_SITE_OTHER): Payer: 59

## 2016-01-30 ENCOUNTER — Ambulatory Visit (INDEPENDENT_AMBULATORY_CARE_PROVIDER_SITE_OTHER): Payer: 59 | Admitting: Physician Assistant

## 2016-01-30 VITALS — BP 116/62 | HR 55 | Ht 67.0 in | Wt 226.0 lb

## 2016-01-30 DIAGNOSIS — R7989 Other specified abnormal findings of blood chemistry: Secondary | ICD-10-CM | POA: Diagnosis not present

## 2016-01-30 DIAGNOSIS — R945 Abnormal results of liver function studies: Principal | ICD-10-CM

## 2016-01-30 LAB — HEPATIC FUNCTION PANEL
ALK PHOS: 92 U/L (ref 39–117)
ALT: 80 U/L — ABNORMAL HIGH (ref 0–53)
AST: 41 U/L — ABNORMAL HIGH (ref 0–37)
Albumin: 4.3 g/dL (ref 3.5–5.2)
BILIRUBIN DIRECT: 0.1 mg/dL (ref 0.0–0.3)
Total Bilirubin: 0.5 mg/dL (ref 0.2–1.2)
Total Protein: 6.9 g/dL (ref 6.0–8.3)

## 2016-01-30 NOTE — Patient Instructions (Addendum)
Please go to the basement level to have your labs drawn.   You have been scheduled for an abdominal ultrasound at  [place] [place] on Monday 02-02-2016 at 8:45 am. Please arrive at 8:30 am prior to your appointment for registration. Make certain not to have anything to eat or drink after midnight . Should you need to reschedule your appointment, please contact radiology at [phone]. This test typically takes about 30 minutes to perform.  We have made you an appointment with Dr. Rayann Heman for 03-10-2016 at 12:00 Noon.          If you are age 40 or older, your body mass index should be between 23-30. Your Body mass index is 35.39 kg/(m^2). If this is out of the aforementioned range listed, please consider follow up with your Primary Care Provider.  If you are age 59 or younger, your body mass index should be between 19-25. Your Body mass index is 35.39 kg/(m^2). If this is out of the aformentioned range listed, please consider follow up with your Primary Care Provider.

## 2016-01-30 NOTE — Progress Notes (Addendum)
Patient ID: Jorge Peterson, male   DOB: 1961/04/08, 55 y.o.   MRN: [id number]   Subjective:    Patient ID: Jorge Peterson, male    DOB: 1961-06-01, 55 y.o.   MRN: [id number]  HPI Jorge Peterson is a pleasant 55 year old white male known to Dr. Hilarie Peterson. He has history of atrial fibrillation, hypertension, adult-onset diabetes mellitus, GERD, diverticulitis, and history of adenomatous colon polyps. He was last seen in the office in March 2017. He will be due for follow-up colonoscopy in December 2017. He is referred today by Dr. [place] Peterson his PCP for evaluation of elevated LFTs. Patient does have history of cholecystectomy and retained common bile duct stones which required ERCP and stone extraction in 2015. He had markedly elevated LFTs at that time. Patient had liver tests done in October 2016 which were normal, labs done in April 2017 showed an AST of 28 male T of 40 and repeat in mid May 2017 show AST of 41 AST of 88 remainder of labs unremarkable. Patient says he feels fine and has absolutely no GI symptoms.. He does not drink any EtOH and has no prior history of any sort of liver problems. He does mention that his mother has fatty liver disease. Of note he was started on Xarelto, flecainide, metoprolol, and Crestor all in October 2016 he was having issues with atrial fibrillation.  Review of Systems Pertinent positive and negative review of systems were noted in the above HPI section.  All other review of systems was otherwise negative.  Outpatient Encounter Prescriptions as of 01/30/2016  Medication Sig  . Biotin 1000 MCG tablet Take 1,000 mcg by mouth daily.  . Coenzyme Q10 (COQ10) 150 MG CAPS Take 150 mg by mouth daily.   . fish oil-omega-3 fatty acids 1000 MG capsule Take 1-2 g by mouth 2 (two) times daily. Take two capsules in the morning and one capsule in the evening.  . flecainide (TAMBOCOR) 150 MG tablet Take 0.5 tablets (75 mg total) by mouth 2 (two) times daily.  . fluticasone (FLONASE)  50 MCG/ACT nasal spray Place 1 spray into the nose daily as needed for allergies.   . folic acid (FOLVITE) A999333 MCG tablet Take 400 mcg by mouth daily.    . hydrochlorothiazide (MICROZIDE) 12.5 MG capsule Take 12.5 mg by mouth daily.  [name] HYDROmorphone (DILAUDID) 2 MG tablet Take 1 tablet (2 mg total) by mouth every 4 (four) hours as needed for severe pain.  [name] losartan (COZAAR) 100 MG tablet Take 100 mg by mouth daily.  . methocarbamol (ROBAXIN) 500 MG tablet Take 1 tablet (500 mg total) by mouth every 6 (six) hours as needed for muscle spasms.  . metoprolol succinate (TOPROL-XL) 25 MG 24 hr tablet Take 1 tablet (25 mg total) by mouth daily. (Patient taking differently: Take 25 mg by mouth every evening. )  . Multiple Vitamin (MULTIVITAMIN) tablet Take 1 tablet by mouth daily.   [name] omeprazole (PRILOSEC) 40 MG capsule TAKE 1 CAPSULE (40 MG TOTAL) BY MOUTH DAILY.  . Probiotic Product (PROBIOTIC FORMULA PO) Take 1 capsule by mouth daily.  . rivaroxaban (XARELTO) 20 MG TABS tablet Take 1 tablet (20 mg total) by mouth daily with supper.  . rosuvastatin (CRESTOR) 10 MG tablet Take 1 tablet (10 mg total) by mouth daily.  . traMADol (ULTRAM) 50 MG tablet Take 1 tablet (50 mg total) by mouth every 4 (four) hours as needed for moderate pain (For pain.).   No facility-administered encounter medications on file  as of 01/30/2016.   Allergies  Allergen Reactions  . Latex Itching and Rash  . Oxycodone-Acetaminophen Itching, Rash and Other (See Comments)    Pt knows this as PERCOCET   Patient Active Problem List   Diagnosis Date Noted  . Quadriceps tendon rupture 01/13/2016  . Overweight 07/14/2015  . Nonspecific (abnormal) findings on radiological and other examination of biliary tract 05/25/2014  . Common bile duct (CBD) obstruction 05/24/2014  . Choledocholithiasis 05/24/2014  . Acute cholecystitis 05/02/2014  . Hx of adenomatous colonic polyps 04/12/2013  . Solitary pulmonary nodule 02/09/2013  . H/O  lymph node biopsy 01/24/2013  . Borderline diabetes 11/27/2012  . Abnormal CT of the chest   11/26/2012  . History of adenomatous polyp of colon 11/05/2011  . Sigmoid diverticulitis 10/23/2011  . Leukocytosis 10/23/2011  . HLD (hyperlipidemia) 10/23/2011  . HTN (hypertension) 10/23/2011  . Bradycardia 10/07/2010  . Atrial fibrillation ([place]) 12/03/2009   Social History   Social History  . Marital Status: Married    Spouse Name: N/A  . Number of Children: 3  . Years of Education: N/A   Occupational History  . Minister/ [employer]    Social History Main Topics  . Smoking status: Never Smoker   . Smokeless tobacco: Never Used  . Alcohol Use: No  . Drug Use: No  . Sexual Activity: Yes   Other Topics Concern  . Not on file   Social History Narrative   Lives in [place].  3 children are all healthy.   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   No siblings with CAD.    [name] family history includes Diabetes in his father; Heart disease in his father; Liver disease in his mother.      Objective:    Filed Vitals:   01/30/16 1344  BP: 116/62  Pulse: 55    Physical Exam  well-developed white male in no acute distress, pleasant accompanied by his wife blood pressure 116/62 pulse 55 height 5 foot 7 weight 226, BMI 35. HEENT; nontraumatic normocephalic EOMI PERRLA sclera anicteric, Cardiovascular; regular rate and rhythm with S1-S2 no murmur rub or gallop, Pulmonary; clear bilaterally, Abdomen; obese soft nontender nondistended bowel sounds are active there is no palpable mass or hepatosplenomegaly, Rectal ;exam not done, Extremities ;patient is wearing a long brace on his right lower extremity, Neuropsych ;mood and affect appropriate     Assessment & Plan:   #73 55 year old white male with new mild transaminitis ( ALT 88, AST 41)noted over the past 2 months. Etiology is not clear but suspect this is drug-induced. Both Crestor and Xarelto can cause  transaminitis. Flecainide lists possible cholestatic liver injury #2 status post cholecystectomy #3 history of common bile duct stone status post ERCP and stone extraction 2015 #4 history of adenomatous colon polyps due for follow-up colonoscopy December 2017 #5 chronic anticoagulation with Xarelto secondary to atrial fibrillation #6 hyperlipidemia #7 hypertension #8 history of diverticulitis  Plan; Check abdominal ultrasound Repeat hepatic panel today Will schedule patient a follow-up with Dr. Rayann Heman to discuss alternative anticoagulant. Patient and his wife tell me that he was specifically placed on Crestor because it would not adversely interact with Flecainide He will need serial liver enzymes done Patient advised not to add any new OTC meds, herbal supplements etc.  Hazeline Charnley S Carrera Kiesel PA-C 01/30/2016   Cc: Jorge Pink, MD  Addendum: Reviewed and agree with initial management. Jerene Bears, MD

## 2016-02-02 ENCOUNTER — Ambulatory Visit
Admission: RE | Admit: 2016-02-02 | Discharge: 2016-02-02 | Disposition: A | Discharge status: Home / Self Care | Payer: 59 | Source: Ambulatory Visit | Attending: Physician Assistant | Admitting: Physician Assistant

## 2016-02-02 DIAGNOSIS — R7989 Other specified abnormal findings of blood chemistry: Secondary | ICD-10-CM | POA: Insufficient documentation

## 2016-02-02 DIAGNOSIS — R945 Abnormal results of liver function studies: Secondary | ICD-10-CM

## 2016-02-03 ENCOUNTER — Other Ambulatory Visit (HOSPITAL_COMMUNITY): Payer: Self-pay | Admitting: *Deleted

## 2016-02-03 ENCOUNTER — Telehealth (HOSPITAL_COMMUNITY): Payer: Self-pay | Admitting: *Deleted

## 2016-02-03 NOTE — Telephone Encounter (Signed)
Patient called in concerned regarding two issues. First being elevated liver enzymes which are being followed by GI - whom recommended changing off xarelto as possible cause of elevated liver enzymes. Butch Penny spoke with patient stated probably more related to statin drug than xarelto - has only 20% of xarelto excreted through kidneys per pharmacist. With CHADVASC score of 1 - patient can stop xarelto. Second issue being broken blood vessels in eye - should improve with discontinuing xarelto. Will follow up with eye doctor if no improvement once off xarelto.  Pt will discuss with MD regarding crestor versus zocor as pt was changed to crestor while on diltiazem which has since been stopped.  Patient verbalized understanding he can stop xarelto per guidelines. Pt will call if further issues

## 2016-02-07 ENCOUNTER — Other Ambulatory Visit (HOSPITAL_COMMUNITY): Payer: Self-pay | Admitting: Nurse Practitioner

## 2016-02-23 ENCOUNTER — Ambulatory Visit (INDEPENDENT_AMBULATORY_CARE_PROVIDER_SITE_OTHER): Payer: 59 | Admitting: *Deleted

## 2016-02-23 DIAGNOSIS — I480 Paroxysmal atrial fibrillation: Secondary | ICD-10-CM

## 2016-02-23 NOTE — Progress Notes (Signed)
Carelink Summary Report / Loop Recorder 

## 2016-03-02 LAB — CUP PACEART REMOTE DEVICE CHECK: MDC IDC SESS DTM: 20170526130521

## 2016-03-02 NOTE — Progress Notes (Signed)
Carelink summary report received. Battery status OK. Normal device function. No new symptom episodes, tachy episodes, brady, or pause episodes. 2 AF- 1 w/ EGM 0.3%, pt no longer on Xarelto due to liver enzymes and CHADSVASc 1. Monthly summary reports and ROV/PRN

## 2016-03-05 ENCOUNTER — Encounter: Payer: Self-pay | Admitting: *Deleted

## 2016-03-09 ENCOUNTER — Telehealth: Payer: Self-pay | Admitting: Internal Medicine

## 2016-03-09 MED ORDER — AMOXICILLIN-POT CLAVULANATE 875-125 MG PO TABS: 1.0000 | ORAL_TABLET | Freq: Two times a day (BID) | ORAL | Status: DC

## 2016-03-09 NOTE — Telephone Encounter (Signed)
Spoke with pt and he is aware. Pt to keep OV that is already scheduled and will discuss colon at that time.

## 2016-03-09 NOTE — Telephone Encounter (Signed)
Left message for pt to call back.  Pt states he has been told by Dr. Hilarie Fredrickson to call and let us know when he thinks his diverticulitis bothering him and we can send in augmentin for him. States he started having abdominal pain last night and would like to have the script sent to his pharmacy. Please advise.

## 2016-03-09 NOTE — Telephone Encounter (Signed)
[facility] for Augmentin 875 mg BID x 7 days  Once diverticulitis resolves, I recommend proceeding with surveillance colonoscopy -- due this year This can be scheduled in Aug or Sept if okay with pt We will need permission to hold anticoagulation from his cardiologist

## 2016-03-10 ENCOUNTER — Encounter: Payer: Self-pay | Admitting: Internal Medicine

## 2016-03-10 ENCOUNTER — Ambulatory Visit (INDEPENDENT_AMBULATORY_CARE_PROVIDER_SITE_OTHER): Payer: 59 | Admitting: Internal Medicine

## 2016-03-10 VITALS — BP 110/80 | HR 59 | Ht 67.0 in | Wt 232.6 lb

## 2016-03-10 DIAGNOSIS — I480 Paroxysmal atrial fibrillation: Secondary | ICD-10-CM

## 2016-03-10 DIAGNOSIS — I10 Essential (primary) hypertension: Secondary | ICD-10-CM

## 2016-03-10 NOTE — Patient Instructions (Signed)

## 2016-03-12 LAB — CUP PACEART REMOTE DEVICE CHECK: Date Time Interrogation Session: 20170625133525

## 2016-03-12 NOTE — Progress Notes (Signed)
Electrophysiology Office Note   Date:  03/12/2016   ID:  Jorge Peterson, DOB 01-Feb-1961, MRN [id number]  PCP:  [name], MD  Primary Electrophysiologist: [name], MD    Chief Complaint  Patient presents with  . Atrial Fibrillation     History of Present Illness: Jorge Peterson is a 55 y.o. male who presents today for electrophysiology evaluation.   The patient is s/p AF ablation by me in 2012.  He has done very well.  Afib burden is down from 16% to 0.4% with flecainide.  He is pleased with current health state.  Working on lifestyle modification.  Today, he denies symptoms of chest pain, shortness of breath, orthopnea, PND, lower extremity edema, claudication, dizziness, presyncope, syncope, bleeding, or neurologic sequela. The patient is tolerating medications without difficulties and is otherwise without complaint today.    Past Medical History  Diagnosis Date  . Atrial fibrillation ([place])     s/p afib ablation 7/12  . Hyperlipidemia   . Bradycardia     asymptomatic  . Diverticulitis   . Hypertension   . GERD (gastroesophageal reflux disease)   . Family history of anesthesia complication     father gets pneumonia after "  . Diabetes ([place])   . Internal hemorrhoids   . Tubular adenoma of colon   . Dysrhythmia   . Wears glasses   . Tinnitus   . Fall   . Elevated LFTs    Past Surgical History  Procedure Laterality Date  . Shoulder surgery      x 2  . Back surgery    . Atrial ablation surgery  03/24/11    PVI by JA  . Fistula surgery    . Tonsillectomy    . Bronchoscopy  01/23/2013     biopsy  . Video bronchoscopy with endobronchial ultrasound N/A 01/23/2013    Procedure: VIDEO BRONCHOSCOPY WITH ENDOBRONCHIAL ULTRASOUND;  Surgeon: Ivin Poot, MD;  Location: [facility];  Service: Thoracic;  Laterality: N/A;  . Mediastinoscopy N/A 01/23/2013    Procedure: MEDIASTINOSCOPY;  Surgeon: Ivin Poot, MD;  Location: [facility];  Service: Thoracic;  Laterality:  N/A;  . Cholecystectomy  05/02/2014    LAP CHOLE  . Cholecystectomy N/A 05/02/2014    Procedure: LAPAROSCOPIC CHOLECYSTECTOMY WITH INTRAOPERATIVE CHOLANGIOGRAM;  Surgeon: Erroll Luna, MD;  Location: [facility];  Service: General;  Laterality: N/A;  . Ercp N/A 05/25/2014    Procedure: ENDOSCOPIC RETROGRADE CHOLANGIOPANCREATOGRAPHY (ERCP);  Surgeon: [name], MD;  Location: [facility];  Service: Endoscopy;  Laterality: N/A;  . Ep implantable device N/A 06/27/2015    Procedure: Loop Recorder Insertion;  Surgeon: [name], MD;  Location: [facility];  Service: Cardiovascular;  Laterality: N/A;  . Quadriceps tendon repair Right 01/13/2016    Procedure: REPAIR QUADRICEP TENDON;  Surgeon: Latanya Maudlin, MD;  Location: [facility];  Service: Orthopedics;  Laterality: Right;  Received femoral nerve block in holding     Current Outpatient Prescriptions  Medication Sig Dispense Refill  . amoxicillin-clavulanate (AUGMENTIN) 875-125 MG tablet Take 1 tablet by mouth 2 (two) times daily. 14 tablet 0  . Biotin 1000 MCG tablet Take 1,000 mcg by mouth daily.    . Coenzyme Q10 (COQ10) 150 MG CAPS Take 150 mg by mouth daily.     . fish oil-omega-3 fatty acids 1000 MG capsule Take 1-2 g by mouth 2 (two) times daily. Take two capsules in the morning and one capsule in the evening.    [name]  flecainide (TAMBOCOR) 150 MG tablet Take 0.5 tablets (75 mg total) by mouth 2 (two) times daily. 30 tablet 6  . fluticasone (FLONASE) 50 MCG/ACT nasal spray Place 1 spray into the nose daily as needed for allergies.     . folic acid (FOLVITE) A999333 MCG tablet Take 400 mcg by mouth daily.      . hydrochlorothiazide (MICROZIDE) 12.5 MG capsule Take 12.5 mg by mouth daily.    [name] losartan (COZAAR) 100 MG tablet Take 100 mg by mouth daily.    . methocarbamol (ROBAXIN) 500 MG tablet Take 1 tablet (500 mg total) by mouth every 6 (six) hours as needed for muscle spasms. 40 tablet 1  . metoprolol succinate (TOPROL-XL) 25 MG 24 hr  tablet TAKE 1 TABLET (25 MG TOTAL) BY MOUTH DAILY. 30 tablet 6  . Multiple Vitamin (MULTIVITAMIN) tablet Take 1 tablet by mouth daily.     [name] omeprazole (PRILOSEC) 40 MG capsule TAKE 1 CAPSULE (40 MG TOTAL) BY MOUTH DAILY. 90 capsule 1  . rosuvastatin (CRESTOR) 10 MG tablet Take 1 tablet (10 mg total) by mouth daily. 90 tablet 3  . traMADol (ULTRAM) 50 MG tablet Take 1 tablet (50 mg total) by mouth every 4 (four) hours as needed for moderate pain (For pain.). 60 tablet 0   No current facility-administered medications for this visit.    Allergies:   Latex and Oxycodone-acetaminophen   Social History:  The patient  reports that he has never smoked. He has never used smokeless tobacco. He reports that he does not drink alcohol or use illicit drugs.   Family History:  The patient's  family history includes Diabetes in his father; Heart disease in his father; Liver disease in his mother.    ROS:  Please see the history of present illness.   All other systems are reviewed and negative.    PHYSICAL EXAM: VS:  BP 110/80 mmHg  Pulse 59  Ht 5\' 7"  (1.702 m)  Wt 232 lb 9.6 oz (105.507 kg)  BMI 36.42 kg/m2 , BMI Body mass index is 36.42 kg/(m^2). GEN: Well nourished, well developed, in no acute distress HEENT: normal Neck: no JVD, carotid bruits, or masses Cardiac: RRR; no murmurs, rubs, or gallops,no edema  Respiratory:  clear to auscultation bilaterally, normal work of breathing GI: soft, nontender, nondistended, + BS MS: no deformity or atrophy Skin: warm and dry  Neuro:  Strength and sensation are intact Psych: euthymic mood, full affect  ILR interrogation is reviewed (see paceart)   Recent Labs: 10/17/2015: Magnesium 2.1; TSH 1.683 01/12/2016: Hemoglobin 14.0; Platelets 168 01/14/2016: BUN 20; Creatinine, Ser 1.10; Potassium 4.2; Sodium 138 01/30/2016: ALT 80*    Lipid Panel     Component Value Date/Time   CHOL 128 11/27/2012 0456   TRIG 123 11/27/2012 0456   HDL 34* 11/27/2012  0456   CHOLHDL 3.8 11/27/2012 0456   VLDL 25 11/27/2012 0456   LDLCALC 69 11/27/2012 0456     Wt Readings from Last 3 Encounters:  03/10/16 232 lb 9.6 oz (105.507 kg)  01/30/16 226 lb (102.513 kg)  01/13/16 232 lb (105.235 kg)    ekg today reveals sinus rhythm RBBB, PR 210 msec, Qtc 437 msec   ASSESSMENT AND PLAN:  1.  Paroxysmal atrial fibrillation Wishes to continue flecainide Will follow with ILR (AF burden is now 0.4%) chads2vasc score is 1.  He wishes to stop Evans at this time.    2. Obesity Weight loss encouraged  3. Bradycardia Asymptomatic  Current medicines are reviewed at length with the patient today.   The patient does not have concerns regarding his medicines.  The following changes were made today:  None  carelink Return to see me in 6 months   Signed, [name], MD  03/12/2016 11:25 AM     [address] [place] [address] [phone] (office) [phone] (fax)

## 2016-03-23 ENCOUNTER — Ambulatory Visit (INDEPENDENT_AMBULATORY_CARE_PROVIDER_SITE_OTHER): Payer: 59 | Admitting: *Deleted

## 2016-03-23 DIAGNOSIS — I480 Paroxysmal atrial fibrillation: Secondary | ICD-10-CM | POA: Diagnosis not present

## 2016-03-23 NOTE — Progress Notes (Signed)
Carelink Summary Report / Loop Recorder 

## 2016-03-26 ENCOUNTER — Telehealth: Payer: Self-pay | Admitting: Internal Medicine

## 2016-03-26 NOTE — Telephone Encounter (Signed)
Spoke with patient and he is at [facility]. Patient instructed to go to Urgent Care or ED for evaluation.

## 2016-03-31 ENCOUNTER — Other Ambulatory Visit (INDEPENDENT_AMBULATORY_CARE_PROVIDER_SITE_OTHER): Payer: 59

## 2016-03-31 ENCOUNTER — Ambulatory Visit (INDEPENDENT_AMBULATORY_CARE_PROVIDER_SITE_OTHER): Payer: 59 | Admitting: Internal Medicine

## 2016-03-31 ENCOUNTER — Encounter: Payer: Self-pay | Admitting: Internal Medicine

## 2016-03-31 VITALS — BP 110/80 | HR 72 | Ht 67.0 in | Wt 234.6 lb

## 2016-03-31 DIAGNOSIS — K760 Fatty (change of) liver, not elsewhere classified: Secondary | ICD-10-CM | POA: Diagnosis not present

## 2016-03-31 DIAGNOSIS — Z8719 Personal history of other diseases of the digestive system: Secondary | ICD-10-CM | POA: Diagnosis not present

## 2016-03-31 DIAGNOSIS — Z860100 Personal history of colon polyps, unspecified: Secondary | ICD-10-CM

## 2016-03-31 DIAGNOSIS — R748 Abnormal levels of other serum enzymes: Secondary | ICD-10-CM

## 2016-03-31 LAB — COMPREHENSIVE METABOLIC PANEL
ALBUMIN: 4.4 g/dL (ref 3.5–5.2)
ALK PHOS: 90 U/L (ref 39–117)
ALT: 43 U/L (ref 0–53)
AST: 26 U/L (ref 0–37)
BILIRUBIN TOTAL: 0.6 mg/dL (ref 0.2–1.2)
BUN: 19 mg/dL (ref 6–23)
CALCIUM: 10.2 mg/dL (ref 8.4–10.5)
CO2: 30 meq/L (ref 19–32)
CREATININE: 1.07 mg/dL (ref 0.40–1.50)
Chloride: 102 mEq/L (ref 96–112)
GFR: 75.75 mL/min (ref >60.00)
Glucose, Bld: 153 mg/dL — ABNORMAL HIGH (ref 70–99)
Potassium: 4.3 mEq/L (ref 3.5–5.1)
Sodium: 142 mEq/L (ref 135–145)
TOTAL PROTEIN: 7.2 g/dL (ref 6.0–8.3)

## 2016-03-31 LAB — CBC WITH DIFFERENTIAL/PLATELET
BASOS ABS: 0 10*3/uL (ref 0.0–0.1)
Basophils Relative: 0.5 % (ref 0.0–3.0)
EOS ABS: 0.2 10*3/uL (ref 0.0–0.7)
Eosinophils Relative: 2.4 % (ref 0.0–5.0)
HEMATOCRIT: 43.9 % (ref 39.0–52.0)
HEMOGLOBIN: 14.7 g/dL (ref 13.0–17.0)
LYMPHS PCT: 35.3 % (ref 12.0–46.0)
Lymphs Abs: 2.9 10*3/uL (ref 0.7–4.0)
MCHC: 33.4 g/dL (ref 30.0–36.0)
MCV: 82.3 fl (ref 78.0–100.0)
MONOS PCT: 8.1 % (ref 3.0–12.0)
Monocytes Absolute: 0.7 10*3/uL (ref 0.1–1.0)
Neutro Abs: 4.4 10*3/uL (ref 1.4–7.7)
Neutrophils Relative %: 53.7 % (ref 43.0–77.0)
Platelets: 191 10*3/uL (ref 150.0–400.0)
RBC: 5.34 Mil/uL (ref 4.22–5.81)
RDW: 12.6 % (ref 11.5–15.5)
WBC: 8.1 10*3/uL (ref 4.0–10.5)

## 2016-03-31 LAB — PROTIME-INR
INR: 1 ratio (ref 0.8–1.0)
PROTHROMBIN TIME: 10.7 s (ref 9.6–13.1)

## 2016-03-31 MED ORDER — NA SULFATE-K SULFATE-MG SULF 17.5-3.13-1.6 GM/177ML PO SOLN: ORAL | 0 refills | Status: DC

## 2016-03-31 MED ORDER — AMOXICILLIN-POT CLAVULANATE 875-125 MG PO TABS: 1.0000 | ORAL_TABLET | Freq: Two times a day (BID) | ORAL | 0 refills | Status: DC

## 2016-03-31 MED ORDER — VITAMIN E 400 UNITS PO TABS: 2.0000 | ORAL_TABLET | Freq: Every day | ORAL | 1 refills | Status: DC

## 2016-03-31 NOTE — Progress Notes (Signed)
Subjective:    Patient ID: Jorge Peterson, male    DOB: July 15, 1961, 55 y.o.   MRN: [id number]  HPI Jorge Peterson is a 55 year old male with past medical history of adenomatous colon polyps, diverticulosis with intermittent diverticulitis, GERD, elevated liver enzymes is here for follow-up. He also has a history of atrial fibrillation now off of Xarelto, diabetes, hypertension. He was last seen in the office in early June 2017 by Nicoletta Ba, PA-C. He returns for follow-up with me.  He reports today he is feeling well. He was treated with antibiotics, Augmentin, in early July when he developed left-sided abdominal pain consistent with prior diverticulitis. He reports feeling very certain this was early diverticulitis. He often gets slight constipation hard stools in addition to the left-sided, usually lower, abdominal pain. After 2-3 days of oral antibiotics his pain resolved. He completed antibiotics as prescribed. He reports that on 03/26/2016 at the end of his vacation [place] at the beach she developed bilateral lower abdominal cramping nausea, and loose stools. He feels like this was food poisoning. Lasted less than 24 hours. Not associated with vomiting or fever. The symptoms have resolved.  Today he reports no abdominal pain. He reports his stools are regular and formed without blood or melena. He occasionally sees red blood with wiping which he attributes to hemorrhoids. He does avoid popcorn and seeds. He reports well controlled reflux disease on Prilosec 40 mg daily. He denies dysphagia or odynophagia. Appetite has been good.  He had some elevated liver enzymes and when he saw Amy she felt like this may be related to Crestor or Xarelto. Abdominal ultrasound was performed which showed fatty liver. There is no evidence of overt cirrhotic change or nodularity.  Review of Systems As per history of present illness, otherwise negative  Current Medications, Allergies, Past Medical History, Past  Surgical History, Family History and Social History were reviewed in [employer] record.     Objective:   Physical Exam BP 110/80 (BP Location: Left Arm, Patient Position: Sitting, Cuff Size: Normal)   Pulse 72   Ht 5\' 7"  (1.702 m)   Wt 234 lb 9.6 oz (106.4 kg)   BMI 36.74 kg/m  Constitutional: Well-developed and well-nourished. No distress. HEENT: Normocephalic and atraumatic. Oropharynx is clear and moist. No oropharyngeal exudate. Conjunctivae are normal.  No scleral icterus. Neck: Neck supple. Trachea midline. Cardiovascular: Normal rate, regular rhythm and intact distal pulses. No M/R/G Pulmonary/chest: Effort normal and breath sounds normal. No wheezing, rales or rhonchi. Abdominal: Soft, nontender, nondistended. Bowel sounds active throughout. There are no masses palpable. No hepatosplenomegaly. Extremities: no clubbing, cyanosis, or edema. Well healed TKA scar rt Lymphadenopathy: No cervical adenopathy noted. Neurological: Alert and oriented to person place and time. Skin: Skin is warm and dry. No rashes noted. Psychiatric: Normal mood and affect. Behavior is normal.  CMP     Component Value Date/Time   NA 138 01/14/2016 0416   NA 140 04/30/2014 1124   K 4.2 01/14/2016 0416   K 3.8 04/30/2014 1124   CL 104 01/14/2016 0416   CL 106 04/30/2014 1124   CO2 27 01/14/2016 0416   CO2 28 04/30/2014 1124   GLUCOSE 209 (H) 01/14/2016 0416   GLUCOSE 122 (H) 04/30/2014 1124   BUN 20 01/14/2016 0416   BUN 28 (H) 04/30/2014 1124   CREATININE 1.10 01/14/2016 0416   CREATININE 1.38 (H) 04/30/2014 1124   CALCIUM 9.2 01/14/2016 0416   CALCIUM 9.1 04/30/2014 1124  PROT 6.9 01/30/2016 1438   PROT 6.9 04/30/2014 1124   ALBUMIN 4.3 01/30/2016 1438   ALBUMIN 4.0 04/30/2014 1124   AST 41 (H) 01/30/2016 1438   AST 34 04/30/2014 1124   ALT 80 (H) 01/30/2016 1438   ALT 53 04/30/2014 1124   ALKPHOS 92 01/30/2016 1438   ALKPHOS 87 04/30/2014 1124   BILITOT 0.5  01/30/2016 1438   BILITOT 0.5 04/30/2014 1124   GFRNONAA >60 01/14/2016 0416   GFRNONAA 58 (L) 04/30/2014 1124   GFRAA >60 01/14/2016 0416   GFRAA >60 04/30/2014 1124   Lab Results  Component Value Date   INR 0.99 01/12/2016   INR 0.96 05/24/2014   INR 0.92 02/01/2013   CBC    Component Value Date/Time   WBC 6.5 01/12/2016 0900   RBC 5.08 01/12/2016 0900   HGB 14.0 01/12/2016 0900   HGB 13.9 04/30/2014 1124   HCT 42.5 01/12/2016 0900   HCT 42.6 04/30/2014 1124   PLT 168 01/12/2016 0900   PLT 187 04/30/2014 1124   MCV 83.7 01/12/2016 0900   MCV 85 04/30/2014 1124   MCH 27.6 01/12/2016 0900   MCHC 32.9 01/12/2016 0900   RDW 12.7 01/12/2016 0900   RDW 13.0 04/30/2014 1124   LYMPHSABS 2.1 01/12/2016 0900   MONOABS 0.6 01/12/2016 0900   EOSABS 0.2 01/12/2016 0900   BASOSABS 0.0 01/12/2016 0900   Abd [place] -- reviewed     Assessment & Plan:  55 year old male with past medical history of adenomatous colon polyps, diverticulosis with intermittent diverticulitis, GERD, elevated liver enzymes is here for follow-up.  1. Recent diverticulitis -- he has had several attacks of diverticulitis all aborted with antibiotics. No complicated courses. Currently symptom-free and nontender on exam. We discussed diverticulitis today and should this become more frequent I would recommend consideration of sigmoidectomy. He will continue relatively low residue diet. Notify me of any recurrent diverticulitis or need for oral antibiotics.  2. History of adenomatous colon polyps -- surveillance colonoscopy due now. We discussed the risk benefits and alternatives and he wishes to proceed  3. Elevated liver enzymes/fatty liver -- it is possible Xarelto contributed to elevated liver enzymes though he certainly has risk factors for fatty liver disease. We discussed this today. I recommended vitamin E 800 international units daily. Continue diet, exercise and attempts at weight reduction. Check CBC, CMP and  INR today  6 a 12 month follow-up, sooner if necessary 25 minutes spent with the patient today. Greater than 50% was spent in counseling and coordination of care with the patient

## 2016-03-31 NOTE — Patient Instructions (Signed)
You have been scheduled for a colonoscopy. Please follow written instructions given to you at your visit today.  Please pick up your prep supplies at the pharmacy within the next 1-3 days. If you use inhalers (even only as needed), please bring them with you on the day of your procedure. Your physician has requested that you go to www.startemmi.com and enter the access code given to you at your visit today. This web site gives a general overview about your procedure. However, you should still follow specific instructions given to you by our office regarding your preparation for the procedure.  Your physician has requested that you go to the basement for the following lab work before leaving today: CBC, CMP, INR,   Please purchase the following medications over the counter and take as directed: Vitamin E 400 mg-Take 2 tablets by mouth once daily.  We have sent the following medications to your pharmacy for you to pick up at your convenience: Augmentin-only use if needed  If you are age 16 or older, your body mass index should be between 23-30. Your Body mass index is 36.74 kg/m. If this is out of the aforementioned range listed, please consider follow up with your Primary Care Provider.  If you are age 93 or younger, your body mass index should be between 19-25. Your Body mass index is 36.74 kg/m. If this is out of the aformentioned range listed, please consider follow up with your Primary Care Provider.

## 2016-04-17 LAB — CUP PACEART REMOTE DEVICE CHECK: MDC IDC SESS DTM: 20170725133639

## 2016-04-17 NOTE — Progress Notes (Signed)
Carelink summary report received. Battery status OK. Normal device function. No new symptom episodes, tachy episodes, brady, or pause episodes. 1 AF 1% burden. Patient stopped [place], [place] 1.  Monthly summary reports and ROV/PRN

## 2016-04-22 ENCOUNTER — Ambulatory Visit (INDEPENDENT_AMBULATORY_CARE_PROVIDER_SITE_OTHER): Payer: 59 | Admitting: *Deleted

## 2016-04-22 ENCOUNTER — Encounter: Payer: Self-pay | Admitting: Internal Medicine

## 2016-04-22 DIAGNOSIS — I480 Paroxysmal atrial fibrillation: Secondary | ICD-10-CM | POA: Diagnosis not present

## 2016-04-22 NOTE — Progress Notes (Signed)
Carelink Summary Report / Loop Recorder 

## 2016-05-04 ENCOUNTER — Ambulatory Visit (AMBULATORY_SURGERY_CENTER): Payer: 59 | Admitting: Internal Medicine

## 2016-05-04 ENCOUNTER — Encounter: Payer: Self-pay | Admitting: Internal Medicine

## 2016-05-04 VITALS — BP 112/66 | HR 45 | Temp 96.8°F | Resp 8 | Ht 67.0 in | Wt 234.0 lb

## 2016-05-04 DIAGNOSIS — Z86010 Personal history of colonic polyps: Secondary | ICD-10-CM | POA: Diagnosis present

## 2016-05-04 DIAGNOSIS — D127 Benign neoplasm of rectosigmoid junction: Secondary | ICD-10-CM | POA: Diagnosis not present

## 2016-05-04 DIAGNOSIS — D125 Benign neoplasm of sigmoid colon: Secondary | ICD-10-CM | POA: Diagnosis not present

## 2016-05-04 MED ORDER — SODIUM CHLORIDE 0.9 % IV SOLN: 500.0000 mL | INTRAVENOUS | Status: DC

## 2016-05-04 NOTE — Op Note (Signed)
[facility] Patient Name: Jorge Peterson Procedure Date: 05/04/2016 7:17 AM MRN: [id number] Endoscopist: Jerene Bears , MD Age: 55 Referring MD:  Date of Birth: 1961/06/08 Gender: Male Account #: [id number] Procedure:                Colonoscopy Indications:              High risk colon cancer surveillance: Personal                            history of adenoma with villous component, Last                            colonoscopy 5 years ago Medicines:                Monitored Anesthesia Care Procedure:                Pre-Anesthesia Assessment:                           - Prior to the procedure, a History and Physical                            was performed, and patient medications and                            allergies were reviewed. The patient's tolerance of                            previous anesthesia was also reviewed. The risks                            and benefits of the procedure and the sedation                            options and risks were discussed with the patient.                            All questions were answered, and informed consent                            was obtained. Prior Anticoagulants: The patient has                            taken no previous anticoagulant or antiplatelet                            agents. ASA Grade Assessment: II - A patient with                            mild systemic disease. After reviewing the risks                            and benefits, the patient was deemed in  satisfactory condition to undergo the procedure.                           After obtaining informed consent, the colonoscope                            was passed under direct vision. Throughout the                            procedure, the patient's blood pressure, pulse, and                            oxygen saturations were monitored continuously. The                            Model CF-HQ190L [id number]) scope was  introduced                            through the anus and advanced to the the cecum,                            identified by appendiceal orifice and ileocecal                            valve. The colonoscopy was performed without                            difficulty. The patient tolerated the procedure                            well. The quality of the bowel preparation was                            good. The ileocecal valve, appendiceal orifice, and                            rectum were photographed. Scope In: 8:05:12 AM Scope Out: 8:18:41 AM Scope Withdrawal Time: 0 hours 11 minutes 6 seconds  Total Procedure Duration: 0 hours 13 minutes 29 seconds  Findings:                 The digital rectal exam was normal.                           A 4 mm polyp was found in the sigmoid colon. The                            polyp was sessile and was located in small,                            shallow, diverticulum. The polyp was removed with a                            cold biopsy forceps. Resection and retrieval were  complete.                           A 5 mm polyp was found in the recto-sigmoid colon.                            The polyp was sessile. The polyp was removed with a                            cold snare. Resection and retrieval were complete.                           Multiple small and large-mouthed diverticula were                            found from distal transverse colon to sigmoid colon.                           External and internal hemorrhoids were found during                            retroflexion. The hemorrhoids were small. Complications:            No immediate complications. Estimated Blood Loss:     Estimated blood loss: none. Impression:               - One 4 mm polyp in the sigmoid colon, removed with                            a cold biopsy forceps. Resected and retrieved.                           - One 5 mm polyp at the  recto-sigmoid colon,                            removed with a cold snare. Resected and retrieved.                           - Moderate diverticulosis from transverse colon to                            sigmoid colon.                           - External and internal hemorrhoids. Recommendation:           - Patient has a contact number available for                            emergencies. The signs and symptoms of potential                            delayed complications were discussed with the  patient. Return to normal activities tomorrow.                            Written discharge instructions were provided to the                            patient.                           - Resume previous diet.                           - Continue present medications.                           - Await pathology results.                           - Repeat colonoscopy is recommended for                            surveillance based on personal history of previous                            adenomatous polyps. The colonoscopy date will be                            determined after pathology results from today's                            exam become available for review. Jerene Bears, MD 05/04/2016 8:29:00 AM This report has been signed electronically.

## 2016-05-04 NOTE — Progress Notes (Signed)
No problems noted in the recovery room. maw 

## 2016-05-04 NOTE — Patient Instructions (Addendum)
YOU HAD AN ENDOSCOPIC PROCEDURE TODAY AT [facility]:   Refer to the procedure report that was given to you for any specific questions about what was found during the examination.  If the procedure report does not answer your questions, please call your gastroenterologist to clarify.  If you requested that your care partner not be given the details of your procedure findings, then the procedure report has been included in a sealed envelope for you to review at your convenience later.  YOU SHOULD EXPECT: Some feelings of bloating in the abdomen. Passage of more gas than usual.  Walking can help get rid of the air that was put into your GI tract during the procedure and reduce the bloating. If you had a lower endoscopy (such as a colonoscopy or flexible sigmoidoscopy) you may notice spotting of blood in your stool or on the toilet paper. If you underwent a bowel prep for your procedure, you may not have a normal bowel movement for a few days.  Please Note:  You might notice some irritation and congestion in your nose or some drainage.  This is from the oxygen used during your procedure.  There is no need for concern and it should clear up in a day or so.  SYMPTOMS TO REPORT IMMEDIATELY:   Following lower endoscopy (colonoscopy or flexible sigmoidoscopy):  Excessive amounts of blood in the stool  Significant tenderness or worsening of abdominal pains  Swelling of the abdomen that is new, acute  Fever of 100F or higher   Following upper endoscopy (EGD)  Vomiting of blood or coffee ground material  New chest pain or pain under the shoulder blades  Painful or persistently difficult swallowing  New shortness of breath  Fever of 100F or higher  Black, tarry-looking stools  For urgent or emergent issues, a gastroenterologist can be reached at any hour by calling [phone].   DIET:  We do recommend a small meal at first, but then you may proceed to your regular diet.  Drink  plenty of fluids but you should avoid alcoholic beverages for 24 hours.  ACTIVITY:  You should plan to take it easy for the rest of today and you should NOT DRIVE or use heavy machinery until tomorrow (because of the sedation medicines used during the test).    FOLLOW UP: Our staff will call the number listed on your records the next business day following your procedure to check on you and address any questions or concerns that you may have regarding the information given to you following your procedure. If we do not reach you, we will leave a message.  However, if you are feeling well and you are not experiencing any problems, there is no need to return our call.  We will assume that you have returned to your regular daily activities without incident.  If any biopsies were taken you will be contacted by phone or by letter within the next 1-3 weeks.  Please call us at [phone] if you have not heard about the biopsies in 3 weeks.    SIGNATURES/CONFIDENTIALITY: You and/or your care partner have signed paperwork which will be entered into your electronic medical record.  These signatures attest to the fact that that the information above on your After Visit Summary has been reviewed and is understood.  Full responsibility of the confidentiality of this discharge information lies with you and/or your care-partner.   Handouts were given to your care partner on polyps, diverticulosis,  and hemorrhoids. You may resume your current medications today. Await biopsy results. Please call if any questions or concerns.

## 2016-05-04 NOTE — Progress Notes (Signed)
To recovery, report to Willis, RN, VSS 

## 2016-05-05 ENCOUNTER — Telehealth: Payer: Self-pay

## 2016-05-05 NOTE — Telephone Encounter (Signed)
  Follow up Call-  Call back number 05/04/2016  Post procedure Call Back phone  # [phone]  Permission to leave phone message Yes  Some recent data might be hidden     Patient questions:  Do you have a fever, pain , or abdominal swelling? No. Pain Score  0 *  Have you tolerated food without any problems? Yes.    Have you been able to return to your normal activities? Yes.    Do you have any questions about your discharge instructions: Diet   No. Medications  No. Follow up visit  No.  Do you have questions or concerns about your Care? No.  Actions: * If pain score is 4 or above: No action needed, pain <4.

## 2016-05-18 ENCOUNTER — Encounter: Payer: Self-pay | Admitting: Internal Medicine

## 2016-05-22 LAB — CUP PACEART REMOTE DEVICE CHECK: Date Time Interrogation Session: 20170824140634

## 2016-05-22 NOTE — Progress Notes (Signed)
Carelink summary report received. Battery status OK. Normal device function. No new symptom episodes, tachy episodes, brady, or pause episodes. 1.2% AF, V rates controlled, +Xarelto.  Monthly summary reports and ROV/PRN

## 2016-05-24 ENCOUNTER — Ambulatory Visit (INDEPENDENT_AMBULATORY_CARE_PROVIDER_SITE_OTHER): Payer: 59 | Admitting: *Deleted

## 2016-05-24 DIAGNOSIS — I480 Paroxysmal atrial fibrillation: Secondary | ICD-10-CM | POA: Diagnosis not present

## 2016-05-24 NOTE — Progress Notes (Signed)
Carelink Summary Report / Loop Recorder 

## 2016-05-27 ENCOUNTER — Other Ambulatory Visit (HOSPITAL_COMMUNITY): Payer: Self-pay | Admitting: Nurse Practitioner

## 2016-06-16 ENCOUNTER — Other Ambulatory Visit: Payer: Self-pay | Admitting: Internal Medicine

## 2016-06-21 ENCOUNTER — Ambulatory Visit (INDEPENDENT_AMBULATORY_CARE_PROVIDER_SITE_OTHER): Payer: 59 | Admitting: *Deleted

## 2016-06-21 DIAGNOSIS — I480 Paroxysmal atrial fibrillation: Secondary | ICD-10-CM

## 2016-06-21 NOTE — Progress Notes (Signed)
Carelink Summary Report / Loop Recorder 

## 2016-06-26 LAB — CUP PACEART REMOTE DEVICE CHECK: MDC IDC SESS DTM: 20170923141016

## 2016-06-26 NOTE — Progress Notes (Signed)
Carelink summary report received. Battery status OK. Normal device function. No new symptom episodes, tachy episodes, brady, or pause episodes. 4 AF 0.5% pt stopped OAC, per JA notechads2vasc score is 1. +flecainide.  Monthly summary reports and ROV/PRN

## 2016-07-09 ENCOUNTER — Other Ambulatory Visit (HOSPITAL_COMMUNITY): Payer: Self-pay | Admitting: *Deleted

## 2016-07-09 MED ORDER — METOPROLOL SUCCINATE ER 25 MG PO TB24: ORAL_TABLET | ORAL | 3 refills | Status: DC

## 2016-07-17 LAB — CUP PACEART REMOTE DEVICE CHECK
Implantable Pulse Generator Implant Date: 20161028
MDC IDC SESS DTM: 20171023151533

## 2016-07-17 NOTE — Progress Notes (Signed)
Carelink summary report received. Battery status OK. Normal device function. No new symptom episodes, tachy episodes, brady, or pause episodes. 0.7% AF, v rates controlled, +Flecainide. No OAC, CHADS2VASC is 1. Monthly summary reports and ROV/PRN

## 2016-07-21 ENCOUNTER — Ambulatory Visit (INDEPENDENT_AMBULATORY_CARE_PROVIDER_SITE_OTHER): Payer: 59 | Admitting: *Deleted

## 2016-07-21 DIAGNOSIS — I480 Paroxysmal atrial fibrillation: Secondary | ICD-10-CM

## 2016-07-21 NOTE — Progress Notes (Signed)
Carelink Summary Report / Loop Recorder 

## 2016-07-26 ENCOUNTER — Other Ambulatory Visit: Payer: Self-pay | Admitting: Internal Medicine

## 2016-08-20 ENCOUNTER — Ambulatory Visit (INDEPENDENT_AMBULATORY_CARE_PROVIDER_SITE_OTHER): Payer: 59 | Admitting: *Deleted

## 2016-08-20 DIAGNOSIS — I480 Paroxysmal atrial fibrillation: Secondary | ICD-10-CM

## 2016-08-20 NOTE — Progress Notes (Signed)
Carelink Summary Report / Loop Recorder 

## 2016-09-03 LAB — CUP PACEART REMOTE DEVICE CHECK
Implantable Pulse Generator Implant Date: 20161028
MDC IDC SESS DTM: 20171122154021

## 2016-09-20 ENCOUNTER — Ambulatory Visit (INDEPENDENT_AMBULATORY_CARE_PROVIDER_SITE_OTHER): Payer: 59 | Admitting: *Deleted

## 2016-09-20 DIAGNOSIS — I480 Paroxysmal atrial fibrillation: Secondary | ICD-10-CM | POA: Diagnosis not present

## 2016-09-20 NOTE — Progress Notes (Signed)
Carelink Summary Report / Loop Recorder 

## 2016-10-07 LAB — CUP PACEART REMOTE DEVICE CHECK
Date Time Interrogation Session: 20171222154002
MDC IDC PG IMPLANT DT: 20161028

## 2016-10-19 ENCOUNTER — Ambulatory Visit (INDEPENDENT_AMBULATORY_CARE_PROVIDER_SITE_OTHER): Payer: 59 | Admitting: *Deleted

## 2016-10-19 DIAGNOSIS — I480 Paroxysmal atrial fibrillation: Secondary | ICD-10-CM | POA: Diagnosis not present

## 2016-10-19 NOTE — Progress Notes (Signed)
Carelink Summary Report / Loop Recorder 

## 2016-10-23 LAB — CUP PACEART REMOTE DEVICE CHECK
Implantable Pulse Generator Implant Date: 20161028
MDC IDC SESS DTM: 20180121163632

## 2016-11-09 LAB — CUP PACEART REMOTE DEVICE CHECK
Implantable Pulse Generator Implant Date: 20161028
MDC IDC SESS DTM: 20180220163902

## 2016-11-18 ENCOUNTER — Ambulatory Visit (INDEPENDENT_AMBULATORY_CARE_PROVIDER_SITE_OTHER): Payer: 59 | Admitting: *Deleted

## 2016-11-18 DIAGNOSIS — I480 Paroxysmal atrial fibrillation: Secondary | ICD-10-CM

## 2016-11-18 NOTE — Progress Notes (Signed)
Carelink Summary Report / Loop Recorder 

## 2016-11-29 LAB — CUP PACEART REMOTE DEVICE CHECK
MDC IDC PG IMPLANT DT: 20161028
MDC IDC SESS DTM: 20180322164326

## 2016-12-20 ENCOUNTER — Ambulatory Visit (INDEPENDENT_AMBULATORY_CARE_PROVIDER_SITE_OTHER): Payer: 59 | Admitting: *Deleted

## 2016-12-20 DIAGNOSIS — I480 Paroxysmal atrial fibrillation: Secondary | ICD-10-CM | POA: Diagnosis not present

## 2016-12-20 NOTE — Progress Notes (Signed)
Carelink Summary Report / Loop Recorder 

## 2016-12-29 ENCOUNTER — Other Ambulatory Visit (HOSPITAL_COMMUNITY): Payer: Self-pay | Admitting: Nurse Practitioner

## 2016-12-29 ENCOUNTER — Other Ambulatory Visit: Payer: Self-pay | Admitting: Internal Medicine

## 2017-01-04 LAB — CUP PACEART REMOTE DEVICE CHECK
Date Time Interrogation Session: 20180421174007
Implantable Pulse Generator Implant Date: 20161028

## 2017-01-05 ENCOUNTER — Encounter: Payer: Self-pay | Admitting: Internal Medicine

## 2017-01-17 ENCOUNTER — Ambulatory Visit (INDEPENDENT_AMBULATORY_CARE_PROVIDER_SITE_OTHER): Payer: 59 | Admitting: *Deleted

## 2017-01-17 DIAGNOSIS — I480 Paroxysmal atrial fibrillation: Secondary | ICD-10-CM | POA: Diagnosis not present

## 2017-01-18 NOTE — Progress Notes (Signed)
Carelink Summary Report / Loop Recorder 

## 2017-01-20 LAB — CUP PACEART REMOTE DEVICE CHECK
Date Time Interrogation Session: 20180521174449
MDC IDC PG IMPLANT DT: 20161028

## 2017-01-26 ENCOUNTER — Encounter: Payer: Self-pay | Admitting: Internal Medicine

## 2017-01-26 ENCOUNTER — Ambulatory Visit (INDEPENDENT_AMBULATORY_CARE_PROVIDER_SITE_OTHER): Payer: 59 | Admitting: Internal Medicine

## 2017-01-26 VITALS — BP 142/90 | HR 53 | Ht 67.0 in | Wt 234.4 lb

## 2017-01-26 DIAGNOSIS — E782 Mixed hyperlipidemia: Secondary | ICD-10-CM | POA: Diagnosis not present

## 2017-01-26 DIAGNOSIS — I10 Essential (primary) hypertension: Secondary | ICD-10-CM | POA: Diagnosis not present

## 2017-01-26 DIAGNOSIS — I482 Chronic atrial fibrillation, unspecified: Secondary | ICD-10-CM

## 2017-01-26 MED ORDER — SIMVASTATIN 20 MG PO TABS: 20.0000 mg | ORAL_TABLET | Freq: Every day | ORAL | 3 refills | Status: DC

## 2017-01-26 MED ORDER — METOPROLOL SUCCINATE ER 25 MG PO TB24: 12.5000 mg | ORAL_TABLET | Freq: Every day | ORAL | 3 refills | Status: DC

## 2017-01-26 MED ORDER — FLECAINIDE ACETATE 50 MG PO TABS: 50.0000 mg | ORAL_TABLET | Freq: Two times a day (BID) | ORAL | 3 refills | Status: DC

## 2017-01-26 NOTE — Progress Notes (Signed)
PCP: [name], MD  Jorge Peterson is a 56 y.o. male who presents today for routine electrophysiology followup.  Since last being seen in our clinic, the patient reports doing very well.   He is unaware of afib.  Today, he denies symptoms of palpitations, chest pain, shortness of breath,  lower extremity edema, dizziness, presyncope, or syncope.  The patient is otherwise without complaint today.   Past Medical History:  Diagnosis Date  . Atrial fibrillation ([place])    s/p afib ablation 7/12  . Bradycardia    asymptomatic  . Diabetes ([place])   . Diverticulitis   . Dysrhythmia   . Elevated LFTs   . Fall   . Family history of anesthesia complication    father gets pneumonia after "  . GERD (gastroesophageal reflux disease)   . Hyperlipidemia   . Hypertension   . Internal hemorrhoids   . Tinnitus   . Tubular adenoma of colon   . Wears glasses    Past Surgical History:  Procedure Laterality Date  . ATRIAL ABLATION SURGERY  03/24/11   PVI by Greggory Brandy  . BACK SURGERY    . BRONCHOSCOPY  01/23/2013    biopsy  . CHOLECYSTECTOMY  05/02/2014   LAP CHOLE  . CHOLECYSTECTOMY N/A 05/02/2014   Procedure: LAPAROSCOPIC CHOLECYSTECTOMY WITH INTRAOPERATIVE CHOLANGIOGRAM;  Surgeon: Erroll Luna, MD;  Location: [place];  Service: General;  Laterality: N/A;  . EP IMPLANTABLE DEVICE N/A 06/27/2015   Procedure: Loop Recorder Insertion;  Surgeon: [name], MD;  Location: [place] [facility];  Service: Cardiovascular;  Laterality: N/A;  . ERCP N/A 05/25/2014   Procedure: ENDOSCOPIC RETROGRADE CHOLANGIOPANCREATOGRAPHY (ERCP);  Surgeon: [name], MD;  Location: [facility];  Service: Endoscopy;  Laterality: N/A;  . Fistula surgery    . MEDIASTINOSCOPY N/A 01/23/2013   Procedure: MEDIASTINOSCOPY;  Surgeon: Ivin Poot, MD;  Location: [place];  Service: Thoracic;  Laterality: N/A;  . QUADRICEPS TENDON REPAIR Right 01/13/2016   Procedure: REPAIR QUADRICEP TENDON;  Surgeon: Latanya Maudlin, MD;   Location: [facility];  Service: Orthopedics;  Laterality: Right;  Received femoral nerve block in holding  . SHOULDER SURGERY     x 2  . TONSILLECTOMY    . VIDEO BRONCHOSCOPY WITH ENDOBRONCHIAL ULTRASOUND N/A 01/23/2013   Procedure: VIDEO BRONCHOSCOPY WITH ENDOBRONCHIAL ULTRASOUND;  Surgeon: Ivin Poot, MD;  Location: [facility];  Service: Thoracic;  Laterality: N/A;    ROS- all systems are reviewed and negative except as per HPI above  Current Outpatient Prescriptions  Medication Sig Dispense Refill  . Biotin 1000 MCG tablet Take 1,000 mcg by mouth daily.    . Coenzyme Q10 (COQ10) 150 MG CAPS Take 150 mg by mouth daily.     . fish oil-omega-3 fatty acids 1000 MG capsule Take 1-2 g by mouth 2 (two) times daily. Take two capsules in the morning and one capsule in the evening.    . flecainide (TAMBOCOR) 150 MG tablet TAKE 1/2 OF A TABLET BY MOUTH TWICE A DAY 30 tablet 0  . fluticasone (FLONASE) 50 MCG/ACT nasal spray Place 1 spray into the nose daily as needed for allergies.     . folic acid (FOLVITE) 630 MCG tablet Take 400 mcg by mouth daily.      . hydrochlorothiazide (MICROZIDE) 12.5 MG capsule Take 12.5 mg by mouth daily.    [name] losartan (COZAAR) 100 MG tablet Take 100 mg by mouth daily.    . methocarbamol (ROBAXIN) 500 MG tablet  Take 1 tablet (500 mg total) by mouth every 6 (six) hours as needed for muscle spasms. 40 tablet 1  . metoprolol succinate (TOPROL-XL) 25 MG 24 hr tablet TAKE 1 TABLET (25 MG TOTAL) BY MOUTH DAILY. 90 tablet 3  . Multiple Vitamin (MULTIVITAMIN) tablet Take 1 tablet by mouth daily.     [name] omeprazole (PRILOSEC) 40 MG capsule TAKE 1 CAPSULE (40 MG TOTAL) BY MOUTH DAILY. 90 capsule 1  . rosuvastatin (CRESTOR) 10 MG tablet TAKE 1 TABLET (10 MG TOTAL) BY MOUTH DAILY. 90 tablet 3   Current Facility-Administered Medications  Medication Dose Route Frequency Provider Last Rate Last Dose  . 0.9 %  sodium chloride infusion  500 mL Intravenous Continuous Pyrtle, Lajuan Lines, MD         Physical Exam: Vitals:   01/26/17 1206  BP: (!) 142/90  Pulse: (!) 53  SpO2: 96%  Weight: 234 lb 6.4 oz (106.3 kg)  Height: 5\' 7"  (1.702 m)    GEN- The patient is well appearing, alert and oriented x 3 today.   Head- normocephalic, atraumatic Eyes-  Sclera clear, conjunctiva pink Ears- hearing intact Oropharynx- clear Lungs- Clear to ausculation bilaterally, normal work of breathing Chest- ILR site is well healed Heart- Regular rate and rhythm, no murmurs, rubs or gallops, PMI not laterally displaced GI- soft, NT, ND, + BS Extremities- no clubbing, cyanosis, or edema  ILR interrogation- reviewed in detail today,  See PACEART report ekg today is personally reviewed and reveals sinus rhythm 53 bpm, PR 252 msec, RBBB, Qtc 414 msec  Assessment and Plan:  1. Paroxysmal afib Well controlled (AF burden is 0.8%) Reduce flecainide to 50mg  BID and toprol XL to 12.5mg  daily due AV conduction slowing chads2vasc score is 1.  He is clear that he does not wish to take [facility] currently.  2. Obesity Body mass index is 36.71 kg/m. Weight loss advised  3. Bradycardia Reduce toprol and flecainide as above  4. HL He wishes to switch from crestor back to simvastatin due to costs Start simvastatin 20mg  qhs I have advised that he have fasting lipids and LFTs.  He wishes to have PCP perform these and reassures me that he will contact pcp to make arrangements for repeat blood work in 6 weeks.  5. HTN Elevated BP today Repeat by MD is 140/90 2 gram sodium restriction Weight loss He does not wish to start new medicines today  carelink Return to see me in 6 months  [name] MD, [facility] 01/26/2017 12:45 PM

## 2017-01-26 NOTE — Patient Instructions (Addendum)
Medication Instructions:  DECREASE Flecainide (Tambocor) to 50 mg twice daily DECREASE Toprol (Metoprolol) to 12.5 mg daily START Lipitor (Simvastatin) 20 mg at bedtime STOP Crestor (Rosuvastatin)   Labwork: None Ordered   Testing/Procedures: None Ordered   Follow-Up: Your physician wants you to follow-up in: 6 months with Dr. Rayann Heman.  You will receive a reminder letter in the mail two months in advance. If you don't receive a letter, please call our office to schedule the follow-up appointment.   If you need a refill on your cardiac medications before your next appointment, please call your pharmacy.   Thank you for choosing [facility]! Christen Bame, RN [phone]

## 2017-01-28 LAB — CUP PACEART INCLINIC DEVICE CHECK
Date Time Interrogation Session: 20180530160839
MDC IDC PG IMPLANT DT: 20161028

## 2017-02-16 ENCOUNTER — Ambulatory Visit (INDEPENDENT_AMBULATORY_CARE_PROVIDER_SITE_OTHER): Payer: 59 | Admitting: *Deleted

## 2017-02-16 DIAGNOSIS — I482 Chronic atrial fibrillation, unspecified: Secondary | ICD-10-CM

## 2017-02-17 NOTE — Progress Notes (Signed)
Carelink Summary Report / Loop Recorder 

## 2017-02-23 LAB — CUP PACEART REMOTE DEVICE CHECK
Date Time Interrogation Session: 20180620174452
MDC IDC PG IMPLANT DT: 20161028

## 2017-03-18 ENCOUNTER — Ambulatory Visit (INDEPENDENT_AMBULATORY_CARE_PROVIDER_SITE_OTHER): Payer: 59 | Admitting: *Deleted

## 2017-03-18 DIAGNOSIS — I480 Paroxysmal atrial fibrillation: Secondary | ICD-10-CM | POA: Diagnosis not present

## 2017-03-22 NOTE — Progress Notes (Signed)
Carelink Summary Report / Loop Recorder 

## 2017-04-03 LAB — CUP PACEART REMOTE DEVICE CHECK
Date Time Interrogation Session: 20180720201519
MDC IDC PG IMPLANT DT: 20161028

## 2017-04-03 NOTE — Progress Notes (Signed)
Carelink summary report received. Battery status OK. Normal device function. No new symptom episodes, tachy episodes, brady, or pause episodes. 3 AF 1.7% +flecainide, pt declines OAC at this time. Monthly summary reports and ROV/PRN

## 2017-04-18 ENCOUNTER — Ambulatory Visit (INDEPENDENT_AMBULATORY_CARE_PROVIDER_SITE_OTHER): Payer: 59 | Admitting: *Deleted

## 2017-04-18 DIAGNOSIS — I480 Paroxysmal atrial fibrillation: Secondary | ICD-10-CM

## 2017-04-19 NOTE — Progress Notes (Signed)
Carelink Summary Report / Loop Recorder 

## 2017-04-22 LAB — CUP PACEART REMOTE DEVICE CHECK
Date Time Interrogation Session: 20180819201329
Implantable Pulse Generator Implant Date: 20161028

## 2017-05-04 ENCOUNTER — Telehealth: Payer: Self-pay | Admitting: Cardiology

## 2017-05-04 NOTE — Telephone Encounter (Signed)
Spoke w/ pt and requested that he send a manual transmission b/c his home monitor has not updated in at least 14 days.   

## 2017-05-17 ENCOUNTER — Ambulatory Visit (INDEPENDENT_AMBULATORY_CARE_PROVIDER_SITE_OTHER): Payer: 59 | Admitting: *Deleted

## 2017-05-17 DIAGNOSIS — I480 Paroxysmal atrial fibrillation: Secondary | ICD-10-CM | POA: Diagnosis not present

## 2017-05-18 NOTE — Progress Notes (Signed)
Carelink Summary Report / Loop Recorder 

## 2017-05-19 LAB — CUP PACEART REMOTE DEVICE CHECK
Date Time Interrogation Session: 20180918204037
Implantable Pulse Generator Implant Date: 20161028

## 2017-06-16 ENCOUNTER — Ambulatory Visit (INDEPENDENT_AMBULATORY_CARE_PROVIDER_SITE_OTHER): Payer: 59 | Admitting: *Deleted

## 2017-06-16 DIAGNOSIS — I480 Paroxysmal atrial fibrillation: Secondary | ICD-10-CM | POA: Diagnosis not present

## 2017-06-17 LAB — CUP PACEART REMOTE DEVICE CHECK
Date Time Interrogation Session: 20181018204000
MDC IDC PG IMPLANT DT: 20161028

## 2017-06-17 NOTE — Progress Notes (Signed)
Carelink Summary Report / Loop Recorder 

## 2017-07-18 ENCOUNTER — Ambulatory Visit (INDEPENDENT_AMBULATORY_CARE_PROVIDER_SITE_OTHER): Payer: 59 | Admitting: *Deleted

## 2017-07-18 DIAGNOSIS — I480 Paroxysmal atrial fibrillation: Secondary | ICD-10-CM

## 2017-07-18 NOTE — Progress Notes (Signed)
Carelink Summary Report / Loop Recorder 

## 2017-08-01 ENCOUNTER — Other Ambulatory Visit (HOSPITAL_COMMUNITY): Payer: Self-pay | Admitting: Nurse Practitioner

## 2017-08-04 LAB — CUP PACEART REMOTE DEVICE CHECK
Implantable Pulse Generator Implant Date: 20161028
MDC IDC SESS DTM: 20181117231200

## 2017-08-15 ENCOUNTER — Ambulatory Visit (INDEPENDENT_AMBULATORY_CARE_PROVIDER_SITE_OTHER): Payer: 59 | Admitting: *Deleted

## 2017-08-15 ENCOUNTER — Other Ambulatory Visit: Payer: Self-pay | Admitting: Internal Medicine

## 2017-08-15 DIAGNOSIS — I480 Paroxysmal atrial fibrillation: Secondary | ICD-10-CM

## 2017-08-16 NOTE — Progress Notes (Signed)
Carelink Summary Report / Loop Recorder 

## 2017-08-25 LAB — CUP PACEART REMOTE DEVICE CHECK
Date Time Interrogation Session: 20181217233844
Implantable Pulse Generator Implant Date: 20161028

## 2017-09-05 ENCOUNTER — Telehealth: Payer: Self-pay | Admitting: Internal Medicine

## 2017-09-05 MED ORDER — AMOXICILLIN-POT CLAVULANATE 875-125 MG PO TABS: 1.0000 | ORAL_TABLET | Freq: Two times a day (BID) | ORAL | 0 refills | Status: DC

## 2017-09-05 NOTE — Telephone Encounter (Signed)
augmentin 875 mg BID x 7 days Call if not improving

## 2017-09-05 NOTE — Telephone Encounter (Signed)
Patient notified of the recommendations He will call back if he fails to improve.  

## 2017-09-05 NOTE — Telephone Encounter (Signed)
Patient reports that he has diverticulitis.  LLQ pain around "my belt line" and lower abdomen for the last several days.  He reports bloating.  He denies constipation, fever, diarrhea, nausea or vomiting.  He says you instructed him to call when he has flare up of diverticulitis for antibiotics?

## 2017-09-14 ENCOUNTER — Ambulatory Visit (INDEPENDENT_AMBULATORY_CARE_PROVIDER_SITE_OTHER): Payer: 59 | Admitting: *Deleted

## 2017-09-14 DIAGNOSIS — I480 Paroxysmal atrial fibrillation: Secondary | ICD-10-CM | POA: Diagnosis not present

## 2017-09-15 NOTE — Progress Notes (Signed)
Carelink Summary Report / Loop Recorder 

## 2017-09-23 ENCOUNTER — Other Ambulatory Visit: Payer: Self-pay | Admitting: Internal Medicine

## 2017-09-23 LAB — CUP PACEART REMOTE DEVICE CHECK
Date Time Interrogation Session: 20190117004052
Implantable Pulse Generator Implant Date: 20161028

## 2017-09-26 ENCOUNTER — Other Ambulatory Visit: Payer: Self-pay | Admitting: Internal Medicine

## 2017-09-26 ENCOUNTER — Telehealth: Payer: Self-pay | Admitting: Internal Medicine

## 2017-09-26 MED ORDER — OMEPRAZOLE 40 MG PO CPDR: 40.0000 mg | DELAYED_RELEASE_CAPSULE | Freq: Every day | ORAL | 0 refills | Status: DC

## 2017-09-26 NOTE — Telephone Encounter (Signed)
Patient wife states pt needs medication omeprazole refilled at [facility] [place]. Pt scheduled for follow up on 2.6.19.

## 2017-09-26 NOTE — Telephone Encounter (Signed)
Rx sent 

## 2017-09-29 ENCOUNTER — Encounter: Payer: Self-pay | Admitting: *Deleted

## 2017-10-05 ENCOUNTER — Encounter: Payer: Self-pay | Admitting: Internal Medicine

## 2017-10-05 ENCOUNTER — Ambulatory Visit: Payer: 59 | Admitting: Internal Medicine

## 2017-10-05 VITALS — BP 132/78 | HR 84 | Ht 67.0 in | Wt 237.0 lb

## 2017-10-05 DIAGNOSIS — Z8719 Personal history of other diseases of the digestive system: Secondary | ICD-10-CM

## 2017-10-05 DIAGNOSIS — Z86010 Personal history of colonic polyps: Secondary | ICD-10-CM | POA: Diagnosis not present

## 2017-10-05 DIAGNOSIS — K219 Gastro-esophageal reflux disease without esophagitis: Secondary | ICD-10-CM | POA: Diagnosis not present

## 2017-10-05 MED ORDER — AMOXICILLIN-POT CLAVULANATE 875-125 MG PO TABS: 1.0000 | ORAL_TABLET | Freq: Two times a day (BID) | ORAL | 1 refills | Status: AC

## 2017-10-05 MED ORDER — OMEPRAZOLE 40 MG PO CPDR: 40.0000 mg | DELAYED_RELEASE_CAPSULE | Freq: Every day | ORAL | 2 refills | Status: DC

## 2017-10-05 NOTE — Progress Notes (Signed)
Subjective:    Patient ID: Jorge Peterson, male    DOB: 16-Apr-1961, 57 y.o.   MRN: [id number]  HPI Jorge Peterson is a 57 year old male with a history of adenomatous colon polyps, diverticulosis with intermittent recurrent diverticulitis, GERD, history of elevated liver enzymes who is here for follow-up.  He was last seen on March 31, 2016 and he is here alone today.  He reports that he has been doing well.  He ran out of his omeprazole and developed a flare of his reflux but also some epigastric burning discomfort in the center of his upper abdomen to the slight left of center.  This was also described as a bloating type sensation.  When resuming omeprazole this pain resolved completely.  He denies heartburn and abdominal pain at present.  No dysphagia or odynophagia.  Off of omeprazole he also does have an intermittent cough.  He reports regular bowel movements recently without diarrhea or constipation.  He did have a flare of diverticulitis in late December and was treated successfully with Augmentin 875 twice daily times 7 days.  His symptoms are almost always left sided abdominal and left lower quadrant pain.  Given he has had multiple episodes in the past he knows fairly quickly when symptoms start.  He did have history of elevated liver enzymes which was felt secondary to a medication he was using after a quadriceps injury.  I recommended vitamin E but he never started this because liver enzymes normalized quickly after discontinuation of this medication.  He states his primary care follows his liver enzymes routinely.  He has a follow-up with primary care next month for his annual physical.  He denies jaundice, itching, lower extremity edema.  His last colonoscopy was in September 2017.  Hyperplastic polyps only at that time and diverticulosis.  Review of Systems As per HPI, otherwise negative  Current Medications, Allergies, Past Medical History, Past Surgical History, Family History  and Social History were reviewed in [employer] record.     Objective:   Physical Exam BP 132/78   Pulse 84   Ht 5\' 7"  (1.702 m)   Wt 237 lb (107.5 kg)   BMI 37.12 kg/m  Constitutional: Well-developed and well-nourished. No distress. HEENT: Normocephalic and atraumatic.[name] Conjunctivae are normal.  No scleral icterus. Neck: Neck supple. Trachea midline. Cardiovascular: Normal rate, regular rhythm and intact distal pulses. No M/R/G Pulmonary/chest: Effort normal and breath sounds normal. No wheezing, rales or rhonchi. Abdominal: Soft, nontender, nondistended. Bowel sounds active throughout. There are no masses palpable. No hepatosplenomegaly. Extremities: no clubbing, cyanosis, or edema Neurological: Alert and oriented to person place and time. Skin: Skin is warm and dry. Psychiatric: Normal mood and affect. Behavior is normal.  CBC    Component Value Date/Time   WBC 8.1 03/31/2016 1028   RBC 5.34 03/31/2016 1028   HGB 14.7 03/31/2016 1028   HGB 13.9 04/30/2014 1124   HCT 43.9 03/31/2016 1028   HCT 42.6 04/30/2014 1124   PLT 191.0 03/31/2016 1028   PLT 187 04/30/2014 1124   MCV 82.3 03/31/2016 1028   MCV 85 04/30/2014 1124   MCH 27.6 01/12/2016 0900   MCHC 33.4 03/31/2016 1028   RDW 12.6 03/31/2016 1028   RDW 13.0 04/30/2014 1124   LYMPHSABS 2.9 03/31/2016 1028   MONOABS 0.7 03/31/2016 1028   EOSABS 0.2 03/31/2016 1028   BASOSABS 0.0 03/31/2016 1028   CMP     Component Value Date/Time   NA 142 03/31/2016 1028  NA 140 04/30/2014 1124   K 4.3 03/31/2016 1028   K 3.8 04/30/2014 1124   CL 102 03/31/2016 1028   CL 106 04/30/2014 1124   CO2 30 03/31/2016 1028   CO2 28 04/30/2014 1124   GLUCOSE 153 (H) 03/31/2016 1028   GLUCOSE 122 (H) 04/30/2014 1124   BUN 19 03/31/2016 1028   BUN 28 (H) 04/30/2014 1124   CREATININE 1.07 03/31/2016 1028   CREATININE 1.38 (H) 04/30/2014 1124   CALCIUM 10.2 03/31/2016 1028   CALCIUM 9.1 04/30/2014 1124    PROT 7.2 03/31/2016 1028   PROT 6.9 04/30/2014 1124   ALBUMIN 4.4 03/31/2016 1028   ALBUMIN 4.0 04/30/2014 1124   AST 26 03/31/2016 1028   AST 34 04/30/2014 1124   ALT 43 03/31/2016 1028   ALT 53 04/30/2014 1124   ALKPHOS 90 03/31/2016 1028   ALKPHOS 87 04/30/2014 1124   BILITOT 0.6 03/31/2016 1028   BILITOT 0.5 04/30/2014 1124   GFRNONAA >60 01/14/2016 0416   GFRNONAA 58 (L) 04/30/2014 1124   GFRAA >60 01/14/2016 0416   GFRAA >60 04/30/2014 1124       Assessment & Plan:  57 year old male with a history of adenomatous colon polyps, diverticulosis with intermittent recurrent diverticulitis, GERD, history of elevated liver enzymes who is here for follow-up.  1.  GERD/dyspepsia --he developed recurrent reflux but also epigastric abdominal discomfort when he ran out of omeprazole.  This has resolved with resumption of PPI.  We discussed the risks, benefits and alternatives to chronic PPI and he wishes to continue therapy.  We will continue omeprazole 40 mg once daily.  2.  History of diverticulitis --generally he will have 1-2 flares of diverticulitis annually which respond well to antibiotics.  We had previously discussed surgery but at this point his attacks have been mild and infrequent.  I will give him Augmentin 875 mg to use twice daily times 7 days should he develop recurrent diverticulitis symptoms.  He is reminded to call me if he needs to use this medication so that we can document when he has flares and to let me know if symptoms are atypical for him and/or do not respond to therapy.  3.  History of colon polyps --up-to-date on colonoscopy.  Surveillance colonoscopy recommended in September 2022  4.  History of elevated liver enzymes --he is at risk for fatty liver disease but liver enzymes had normalized completely when rechecked.  This was felt secondary to drug reaction.  He will have follow-up liver enzymes next month with primary care.  If they remain elevated he is asked to  let me know.  He voices understanding  25 minutes spent with the patient today. Greater than 50% was spent in counseling and coordination of care with the patient Office follow-up in 1-2 years, sooner as needed

## 2017-10-05 NOTE — Patient Instructions (Signed)
We have sent the following medications to your pharmacy for you to pick up at your convenience: Augmentin x 7 days (2 refills)-Use as needed Omeprazole   Please follow up with Dr Hilarie Fredrickson in 1-2 years.  If you are age 59 or older, your body mass index should be between 23-30. Your Body mass index is 37.12 kg/m. If this is out of the aforementioned range listed, please consider follow up with your Primary Care Provider.  If you are age 60 or younger, your body mass index should be between 19-25. Your Body mass index is 37.12 kg/m. If this is out of the aformentioned range listed, please consider follow up with your Primary Care Provider.

## 2017-10-14 ENCOUNTER — Ambulatory Visit (INDEPENDENT_AMBULATORY_CARE_PROVIDER_SITE_OTHER): Payer: 59 | Admitting: *Deleted

## 2017-10-14 DIAGNOSIS — I480 Paroxysmal atrial fibrillation: Secondary | ICD-10-CM

## 2017-10-17 NOTE — Progress Notes (Signed)
Carelink Summary Report / Loop Recorder 

## 2017-11-10 LAB — CUP PACEART REMOTE DEVICE CHECK
Date Time Interrogation Session: 20190216010936
Implantable Pulse Generator Implant Date: 20161028

## 2017-11-16 ENCOUNTER — Ambulatory Visit (INDEPENDENT_AMBULATORY_CARE_PROVIDER_SITE_OTHER): Payer: 59 | Admitting: *Deleted

## 2017-11-16 DIAGNOSIS — I480 Paroxysmal atrial fibrillation: Secondary | ICD-10-CM

## 2017-11-17 NOTE — Progress Notes (Signed)
Carelink Summary Report / Loop Recorder 

## 2017-11-28 ENCOUNTER — Ambulatory Visit: Payer: 59 | Admitting: Internal Medicine

## 2017-11-28 ENCOUNTER — Encounter: Payer: Self-pay | Admitting: Internal Medicine

## 2017-11-28 VITALS — BP 126/72 | HR 52 | Ht 67.0 in | Wt 235.0 lb

## 2017-11-28 DIAGNOSIS — I10 Essential (primary) hypertension: Secondary | ICD-10-CM | POA: Diagnosis not present

## 2017-11-28 DIAGNOSIS — I480 Paroxysmal atrial fibrillation: Secondary | ICD-10-CM | POA: Diagnosis not present

## 2017-11-28 DIAGNOSIS — E782 Mixed hyperlipidemia: Secondary | ICD-10-CM

## 2017-11-28 LAB — CUP PACEART INCLINIC DEVICE CHECK
MDC IDC PG IMPLANT DT: 20161028
MDC IDC SESS DTM: 20190401151826

## 2017-11-28 NOTE — Patient Instructions (Addendum)
Medication Instructions:  Your physician recommends that you continue on your current medications as directed. Please refer to the Current Medication list given to you today.  Labwork: None ordered.  Testing/Procedures: None ordered.  Follow-Up: Your physician wants you to follow-up in: 6 months with Renee Ursuy, PA. You will receive a reminder letter in the mail two months in advance. If you don't receive a letter, please call our office to schedule the follow-up appointment.   Any Other Special Instructions Will Be Listed Below (If Applicable).     If you need a refill on your cardiac medications before your next appointment, please call your pharmacy.  

## 2017-11-28 NOTE — Progress Notes (Signed)
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 male who presents today for routine electrophysiology followup.  Since last being seen in our clinic, the patient reports doing very well.  Today, he denies symptoms of palpitations, chest pain, shortness of breath,  lower extremity edema, dizziness, presyncope, or syncope.  The patient is otherwise without complaint today.   Past Medical History:  Diagnosis Date  . Atrial fibrillation ([place])    s/p afib ablation 7/12  . Bradycardia    asymptomatic  . Diabetes ([place])   . Diverticulitis   . Dysrhythmia   . Elevated LFTs   . Fall   . Family history of anesthesia complication    father gets pneumonia after "  . GERD (gastroesophageal reflux disease)   . Hyperlipidemia   . Hyperplastic colon polyp   . Hypertension   . Internal hemorrhoids   . Tinnitus   . Tubular adenoma of colon   . Wears glasses    Past Surgical History:  Procedure Laterality Date  . ATRIAL ABLATION SURGERY  03/24/11   PVI by Greggory Brandy  . BACK SURGERY    . BRONCHOSCOPY  01/23/2013    biopsy  . CHOLECYSTECTOMY  05/02/2014   LAP CHOLE  . CHOLECYSTECTOMY N/A 05/02/2014   Procedure: LAPAROSCOPIC CHOLECYSTECTOMY WITH INTRAOPERATIVE CHOLANGIOGRAM;  Surgeon: Erroll Luna, MD;  Location: [place];  Service: General;  Laterality: N/A;  . EP IMPLANTABLE DEVICE N/A 06/27/2015   Procedure: Loop Recorder Insertion;  Surgeon: [name], MD;  Location: [place] [facility];  Service: Cardiovascular;  Laterality: N/A;  . ERCP N/A 05/25/2014   Procedure: ENDOSCOPIC RETROGRADE CHOLANGIOPANCREATOGRAPHY (ERCP);  Surgeon: [name], MD;  Location: [place];  Service: Endoscopy;  Laterality: N/A;  . Fistula surgery    . MEDIASTINOSCOPY N/A 01/23/2013   Procedure: MEDIASTINOSCOPY;  Surgeon: Ivin Poot, MD;  Location: [place];  Service: Thoracic;  Laterality: N/A;  . QUADRICEPS TENDON REPAIR Right 01/13/2016   Procedure: REPAIR QUADRICEP TENDON;   Surgeon: Latanya Maudlin, MD;  [facility];  Service: Orthopedics;  Laterality: Right;  Received femoral nerve block in holding  . SHOULDER SURGERY     x 2  . TONSILLECTOMY    . VIDEO BRONCHOSCOPY WITH ENDOBRONCHIAL ULTRASOUND N/A 01/23/2013   Procedure: VIDEO BRONCHOSCOPY WITH ENDOBRONCHIAL ULTRASOUND;  Surgeon: Ivin Poot, MD;  Location: [facility];  Service: Thoracic;  Laterality: N/A;    ROS- all systems are reviewed and negatives except as per HPI above  Current Outpatient Medications  Medication Sig Dispense Refill  . Biotin 1000 MCG tablet Take 1,000 mcg by mouth daily.    . Coenzyme Q10 (COQ10) 150 MG CAPS Take 150 mg by mouth daily.     . fish oil-omega-3 fatty acids 1000 MG capsule Take 1-2 g by mouth 2 (two) times daily. Take two capsules in the morning and one capsule in the evening.    . flecainide (TAMBOCOR) 50 MG tablet Take 1 tablet (50 mg total) by mouth 2 (two) times daily. 180 tablet 3  . fluticasone (FLONASE) 50 MCG/ACT nasal spray Place 1 spray into the nose daily as needed for allergies.     . folic acid (FOLVITE) 161 MCG tablet Take 400 mcg by mouth daily.      . hydrochlorothiazide (MICROZIDE) 12.5 MG capsule Take 12.5 mg by mouth daily.    [name] losartan (COZAAR) 100 MG tablet Take 100 mg by mouth daily.    [name]  methocarbamol (ROBAXIN) 500 MG tablet Take 1 tablet (500 mg total) by mouth every 6 (six) hours as needed for muscle spasms. 40 tablet 1  . metoprolol succinate (TOPROL-XL) 25 MG 24 hr tablet Take 12.5 mg by mouth daily.    . Multiple Vitamin (MULTIVITAMIN) tablet Take 1 tablet by mouth daily.     [name] omeprazole (PRILOSEC) 40 MG capsule Take 1 capsule (40 mg total) by mouth daily. 90 capsule 2  . simvastatin (ZOCOR) 20 MG tablet Take 1 tablet (20 mg total) by mouth at bedtime. 90 tablet 3   Current Facility-Administered Medications  Medication Dose Route Frequency Provider Last Rate Last Dose  . 0.9 %  sodium chloride infusion  500 mL Intravenous Continuous  Pyrtle, Lajuan Lines, MD        Physical Exam: Vitals:   11/28/17 1054  BP: 126/72  Pulse: (!) 52  Weight: 106.6 kg (235 lb)  Height: 5\' 7"  (1.702 m)    GEN- The patient is well appearing, alert and oriented x 3 today.   Head- normocephalic, atraumatic Eyes-  Sclera clear, conjunctiva pink Ears- hearing intact Oropharynx- clear Lungs- Clear to ausculation bilaterally, normal work of breathing Heart- Regular rate and rhythm, no murmurs, rubs or gallops, PMI not laterally displaced GI- soft, NT, ND, + BS Extremities- no clubbing, cyanosis, or edema  EKG tracing ordered today is personally reviewed and shows sinus rhythm 52 bpm, PR 270 msec, nonspecific ST/T changes  Assessment and Plan:  1. Paroxysmal afib Well controlled  AF burden is 0.8 % (0.8% last visit) chads2vasc score is now 2 (recently diagnosed with diabetes).  He has decided to not take anticoagulation  2. Obesity Body mass index is 36.81 kg/m. Lifestyle modification encouraged  3. bradycardia Asymptomatic  4. HTN Stable No change required today  5. HL Followed by primary  Care Dietary change is advised today  Carelink Return to see EP PA in 6 months  [name] MD, [facility] 11/28/2017 11:12 AM

## 2017-12-19 ENCOUNTER — Ambulatory Visit (INDEPENDENT_AMBULATORY_CARE_PROVIDER_SITE_OTHER): Payer: 59 | Admitting: *Deleted

## 2017-12-19 DIAGNOSIS — I480 Paroxysmal atrial fibrillation: Secondary | ICD-10-CM | POA: Diagnosis not present

## 2017-12-20 NOTE — Progress Notes (Signed)
Carelink Summary Report / Loop Recorder 

## 2017-12-25 LAB — CUP PACEART REMOTE DEVICE CHECK
Implantable Pulse Generator Implant Date: 20161028
MDC IDC SESS DTM: 20190321013745

## 2018-01-16 LAB — CUP PACEART REMOTE DEVICE CHECK
Implantable Pulse Generator Implant Date: 20161028
MDC IDC SESS DTM: 20190423020539

## 2018-01-20 ENCOUNTER — Other Ambulatory Visit: Payer: Self-pay | Admitting: Internal Medicine

## 2018-01-24 ENCOUNTER — Ambulatory Visit (INDEPENDENT_AMBULATORY_CARE_PROVIDER_SITE_OTHER): Payer: 59 | Admitting: *Deleted

## 2018-01-24 ENCOUNTER — Telehealth: Payer: Self-pay | Admitting: Internal Medicine

## 2018-01-24 DIAGNOSIS — I480 Paroxysmal atrial fibrillation: Secondary | ICD-10-CM | POA: Diagnosis not present

## 2018-01-24 MED ORDER — AMOXICILLIN-POT CLAVULANATE 875-125 MG PO TABS: 1.0000 | ORAL_TABLET | Freq: Two times a day (BID) | ORAL | 0 refills | Status: DC

## 2018-01-24 NOTE — Progress Notes (Signed)
Carelink Summary Report / Loop Recorder 

## 2018-01-24 NOTE — Telephone Encounter (Signed)
Ok for Augmentin 875 mg PO BID x 7 days

## 2018-01-24 NOTE — Telephone Encounter (Signed)
Pt states he is having a diverticulitis flare, having LLQ pain and bloating. Pt requesting Augmentin. Please advise.

## 2018-01-24 NOTE — Telephone Encounter (Signed)
Spoke to pt and he is aware, script sent to pharmacy.

## 2018-02-01 ENCOUNTER — Other Ambulatory Visit (HOSPITAL_COMMUNITY): Payer: Self-pay | Admitting: Internal Medicine

## 2018-02-14 LAB — CUP PACEART REMOTE DEVICE CHECK
Date Time Interrogation Session: 20190526024039
Implantable Pulse Generator Implant Date: 20161028

## 2018-02-16 ENCOUNTER — Other Ambulatory Visit (HOSPITAL_COMMUNITY): Payer: Self-pay | Admitting: Internal Medicine

## 2018-02-16 NOTE — Telephone Encounter (Signed)
Outpatient Medication Detail    Disp Refills Start End   metoprolol succinate (TOPROL-XL) 25 MG 24 hr tablet 45 tablet 3 02/01/2018    Sig - Route: Take 0.5 tablets (12.5 mg total) by mouth daily. - Oral   Sent to pharmacy as: metoprolol succinate (TOPROL-XL) 25 MG 24 hr tablet   E-Prescribing Status: Receipt confirmed by pharmacy (02/01/2018 12:25 PM EDT)   Pharmacy   [id number], [place] - [address]

## 2018-02-23 ENCOUNTER — Ambulatory Visit (INDEPENDENT_AMBULATORY_CARE_PROVIDER_SITE_OTHER): Payer: 59 | Admitting: *Deleted

## 2018-02-23 DIAGNOSIS — I480 Paroxysmal atrial fibrillation: Secondary | ICD-10-CM

## 2018-02-24 NOTE — Progress Notes (Signed)
Carelink Summary Report / Loop Recorder 

## 2018-03-24 ENCOUNTER — Encounter: Payer: 59 | Admitting: *Deleted

## 2018-03-24 LAB — CUP PACEART REMOTE DEVICE CHECK
Implantable Pulse Generator Implant Date: 20161028
MDC IDC SESS DTM: 20190628023614

## 2018-03-28 ENCOUNTER — Ambulatory Visit (INDEPENDENT_AMBULATORY_CARE_PROVIDER_SITE_OTHER): Payer: 59 | Admitting: *Deleted

## 2018-03-28 DIAGNOSIS — I480 Paroxysmal atrial fibrillation: Secondary | ICD-10-CM

## 2018-03-29 NOTE — Progress Notes (Signed)
Carelink Summary Report / Loop Recorder 

## 2018-05-02 ENCOUNTER — Telehealth: Payer: Self-pay | Admitting: Internal Medicine

## 2018-05-02 ENCOUNTER — Ambulatory Visit (INDEPENDENT_AMBULATORY_CARE_PROVIDER_SITE_OTHER): Payer: 59 | Admitting: *Deleted

## 2018-05-02 DIAGNOSIS — I480 Paroxysmal atrial fibrillation: Secondary | ICD-10-CM

## 2018-05-02 NOTE — Progress Notes (Signed)
Carelink Summary Report / Loop Recorder 

## 2018-05-02 NOTE — Telephone Encounter (Signed)
Patient reports that he has had another episode of diverticulitis.  He has started on augmentin you sent in  Feb on Sat evening. He is feeling better this am.   He will stay on liquids today and advance to a bland diet as tolerated over 24-48 hours.  He is asked to call at the end of the week with an update on his symptoms. This is his 3rd attack since February and has had intermittent diarrhea. He will come in for a follow up on 06/28/18.

## 2018-05-04 ENCOUNTER — Telehealth: Payer: Self-pay | Admitting: Internal Medicine

## 2018-05-04 NOTE — Telephone Encounter (Signed)
Augmentin can be associated with significant diarrhea.  I would think this is too soon for this to be an antibiotic associated infection.  Would have him start Florastor 250 mg twice daily.  Continue with bland and liquid diet. If abdominal pain continues or worsens, I would like to perform an abdominal CT scan with contrast tomorrow.  Please check on him tomorrow.

## 2018-05-04 NOTE — Telephone Encounter (Signed)
Pt was started on augmentin on Tuesday for diverticulitis. Pt reports he is doing better but this time there are some different symptoms. Reports he has upper abd pain this time and lots to bloating. He is still following a soft diet, soups and mashed potatoes. He also states he usually has constipation with diverticulitis but this time he is having urgent diarrhea. Please advise.

## 2018-05-04 NOTE — Telephone Encounter (Signed)
Spoke with pt and he is aware. WIll check on him tomorrow.

## 2018-05-05 NOTE — Telephone Encounter (Signed)
Spoke with pt and he states the pain is better and the bloating seems to be a little better. Pt knows to call back if he does not continue to improve or develops any new problems. Dr. Hilarie Fredrickson aware.

## 2018-05-08 ENCOUNTER — Other Ambulatory Visit: Payer: Self-pay

## 2018-05-08 DIAGNOSIS — R1032 Left lower quadrant pain: Secondary | ICD-10-CM

## 2018-05-09 ENCOUNTER — Ambulatory Visit (INDEPENDENT_AMBULATORY_CARE_PROVIDER_SITE_OTHER)
Admission: RE | Admit: 2018-05-09 | Discharge: 2018-05-09 | Disposition: A | Discharge status: Home / Self Care | Payer: 59 | Source: Ambulatory Visit | Attending: Internal Medicine | Admitting: Internal Medicine

## 2018-05-09 DIAGNOSIS — K5732 Diverticulitis of large intestine without perforation or abscess without bleeding: Secondary | ICD-10-CM | POA: Diagnosis not present

## 2018-05-09 DIAGNOSIS — R1032 Left lower quadrant pain: Secondary | ICD-10-CM | POA: Diagnosis not present

## 2018-05-09 LAB — CUP PACEART REMOTE DEVICE CHECK
Implantable Pulse Generator Implant Date: 20161028
MDC IDC SESS DTM: 20190731041058

## 2018-05-09 MED ORDER — IOPAMIDOL (ISOVUE-300) INJECTION 61%
100.0000 mL | Freq: Once | INTRAVENOUS | Status: AC | PRN
  Administered 2018-05-09: 100 mL via INTRAVENOUS

## 2018-05-10 ENCOUNTER — Telehealth: Payer: Self-pay | Admitting: *Deleted

## 2018-05-10 NOTE — Telephone Encounter (Signed)
Dr Hilarie Fredrickson has reviewed patient's CT scan from 05/09/18. Depending on patient's symptoms (presuming he is having pain), he would like patient to be admitted to hospital for possible IV antibiotics etc. Since scan shows intra-abdominal fluid with diverticulitis and localized perforation.  Patient states that his pain remains at 3-4/10. He has been on clear liquids with only occasional mashed potatoes x over 1 week and has had antibiotics as well.  I have contacted Dr Hilarie Fredrickson to find out further deposition on this.

## 2018-05-10 NOTE — Telephone Encounter (Signed)
Dr Hilarie Fredrickson called and spoke to Jorge Peterson. They agreed that Jorge Peterson would come to office tomorrow morning, 05/11/18 to see [name], PA-C and will most likely have direct hospital admission from there. Patient is scheduled to see Anderson Malta tomorrow, 05/11/18 at 8:45 am and he verbalizes understanding of this.

## 2018-05-11 ENCOUNTER — Encounter (HOSPITAL_COMMUNITY): Payer: Self-pay

## 2018-05-11 ENCOUNTER — Inpatient Hospital Stay (HOSPITAL_COMMUNITY)
Admission: AD | Admit: 2018-05-11 | Discharge: 2018-05-15 | DRG: 392 | Disposition: A | Discharge status: Home Health | Payer: 59 | Source: Ambulatory Visit | Attending: Internal Medicine | Admitting: Internal Medicine

## 2018-05-11 ENCOUNTER — Ambulatory Visit: Payer: 59 | Admitting: Physician Assistant

## 2018-05-11 ENCOUNTER — Encounter: Payer: Self-pay | Admitting: Physician Assistant

## 2018-05-11 ENCOUNTER — Other Ambulatory Visit: Payer: Self-pay

## 2018-05-11 VITALS — BP 122/70 | HR 76 | Ht 66.0 in | Wt 215.0 lb

## 2018-05-11 DIAGNOSIS — Z833 Family history of diabetes mellitus: Secondary | ICD-10-CM

## 2018-05-11 DIAGNOSIS — Z885 Allergy status to narcotic agent status: Secondary | ICD-10-CM

## 2018-05-11 DIAGNOSIS — K5720 Diverticulitis of large intestine with perforation and abscess without bleeding: Secondary | ICD-10-CM | POA: Diagnosis not present

## 2018-05-11 DIAGNOSIS — E669 Obesity, unspecified: Secondary | ICD-10-CM | POA: Diagnosis present

## 2018-05-11 DIAGNOSIS — Z8249 Family history of ischemic heart disease and other diseases of the circulatory system: Secondary | ICD-10-CM

## 2018-05-11 DIAGNOSIS — I110 Hypertensive heart disease with heart failure: Secondary | ICD-10-CM | POA: Diagnosis present

## 2018-05-11 DIAGNOSIS — R911 Solitary pulmonary nodule: Secondary | ICD-10-CM | POA: Diagnosis present

## 2018-05-11 DIAGNOSIS — Z7984 Long term (current) use of oral hypoglycemic drugs: Secondary | ICD-10-CM

## 2018-05-11 DIAGNOSIS — G43909 Migraine, unspecified, not intractable, without status migrainosus: Secondary | ICD-10-CM | POA: Diagnosis present

## 2018-05-11 DIAGNOSIS — Z9049 Acquired absence of other specified parts of digestive tract: Secondary | ICD-10-CM | POA: Diagnosis not present

## 2018-05-11 DIAGNOSIS — K219 Gastro-esophageal reflux disease without esophagitis: Secondary | ICD-10-CM | POA: Diagnosis present

## 2018-05-11 DIAGNOSIS — I480 Paroxysmal atrial fibrillation: Secondary | ICD-10-CM | POA: Diagnosis present

## 2018-05-11 DIAGNOSIS — K5730 Diverticulosis of large intestine without perforation or abscess without bleeding: Secondary | ICD-10-CM | POA: Diagnosis not present

## 2018-05-11 DIAGNOSIS — Z79899 Other long term (current) drug therapy: Secondary | ICD-10-CM | POA: Diagnosis not present

## 2018-05-11 DIAGNOSIS — Z86010 Personal history of colonic polyps: Secondary | ICD-10-CM | POA: Diagnosis not present

## 2018-05-11 DIAGNOSIS — E785 Hyperlipidemia, unspecified: Secondary | ICD-10-CM | POA: Diagnosis not present

## 2018-05-11 DIAGNOSIS — Z792 Long term (current) use of antibiotics: Secondary | ICD-10-CM

## 2018-05-11 DIAGNOSIS — I5032 Chronic diastolic (congestive) heart failure: Secondary | ICD-10-CM | POA: Diagnosis present

## 2018-05-11 DIAGNOSIS — R001 Bradycardia, unspecified: Secondary | ICD-10-CM | POA: Diagnosis present

## 2018-05-11 DIAGNOSIS — E119 Type 2 diabetes mellitus without complications: Secondary | ICD-10-CM | POA: Diagnosis present

## 2018-05-11 DIAGNOSIS — Z91040 Latex allergy status: Secondary | ICD-10-CM

## 2018-05-11 LAB — COMPREHENSIVE METABOLIC PANEL
ALBUMIN: 3.6 g/dL (ref 3.5–5.0)
ALK PHOS: 73 U/L (ref 38–126)
ALT: 20 U/L (ref 0–44)
AST: 20 U/L (ref 15–41)
Anion gap: 12 (ref 5–15)
BUN: 14 mg/dL (ref 6–20)
CALCIUM: 9.7 mg/dL (ref 8.9–10.3)
CHLORIDE: 100 mmol/L (ref 98–111)
CO2: 29 mmol/L (ref 22–32)
CREATININE: 1.14 mg/dL (ref 0.61–1.24)
GFR calc Af Amer: >60 mL/min (ref >60)
GFR calc non Af Amer: >60 mL/min (ref >60)
GLUCOSE: 107 mg/dL — AB (ref 70–99)
Potassium: 3.9 mmol/L (ref 3.5–5.1)
SODIUM: 141 mmol/L (ref 135–145)
Total Bilirubin: 0.8 mg/dL (ref 0.3–1.2)
Total Protein: 7.2 g/dL (ref 6.5–8.1)

## 2018-05-11 LAB — CBC WITH DIFFERENTIAL/PLATELET
Basophils Absolute: 0 10*3/uL (ref 0.0–0.1)
Basophils Relative: 0 %
Eosinophils Absolute: 0.1 10*3/uL (ref 0.0–0.7)
Eosinophils Relative: 2 %
HCT: 41.5 % (ref 39.0–52.0)
Hemoglobin: 13.7 g/dL (ref 13.0–17.0)
LYMPHS PCT: 36 %
Lymphs Abs: 2.6 10*3/uL (ref 0.7–4.0)
MCH: 27.7 pg (ref 26.0–34.0)
MCHC: 33 g/dL (ref 30.0–36.0)
MCV: 83.8 fL (ref 78.0–100.0)
MONO ABS: 0.5 10*3/uL (ref 0.1–1.0)
MONOS PCT: 7 %
NEUTROS ABS: 4 10*3/uL (ref 1.7–7.7)
Neutrophils Relative %: 55 %
Platelets: 275 10*3/uL (ref 150–400)
RBC: 4.95 MIL/uL (ref 4.22–5.81)
RDW: 12.2 % (ref 11.5–15.5)
WBC: 7.2 10*3/uL (ref 4.0–10.5)

## 2018-05-11 LAB — MAGNESIUM: Magnesium: 2.3 mg/dL (ref 1.7–2.4)

## 2018-05-11 MED ORDER — PIPERACILLIN-TAZOBACTAM 3.375 G IVPB
3.3750 g | Freq: Three times a day (TID) | INTRAVENOUS | Status: DC
  Administered 2018-05-11 – 2018-05-14 (×9): 3.375 g via INTRAVENOUS
  Filled 2018-05-11 (×10): qty 50

## 2018-05-11 MED ORDER — METOPROLOL SUCCINATE ER 25 MG PO TB24
12.5000 mg | ORAL_TABLET | Freq: Every day | ORAL | Status: DC
  Filled 2018-05-11 (×3): qty 1

## 2018-05-11 MED ORDER — PANTOPRAZOLE SODIUM 40 MG PO TBEC
40.0000 mg | DELAYED_RELEASE_TABLET | Freq: Every day | ORAL | Status: DC
  Administered 2018-05-12 – 2018-05-15 (×4): 40 mg via ORAL
  Filled 2018-05-11 (×4): qty 1

## 2018-05-11 MED ORDER — FLECAINIDE ACETATE 50 MG PO TABS
50.0000 mg | ORAL_TABLET | Freq: Two times a day (BID) | ORAL | Status: DC
  Administered 2018-05-11 – 2018-05-15 (×8): 50 mg via ORAL
  Filled 2018-05-11 (×9): qty 1

## 2018-05-11 MED ORDER — ENOXAPARIN SODIUM 40 MG/0.4ML ~~LOC~~ SOLN
40.0000 mg | SUBCUTANEOUS | Status: DC
  Administered 2018-05-11 – 2018-05-14 (×4): 40 mg via SUBCUTANEOUS
  Filled 2018-05-11 (×4): qty 0.4

## 2018-05-11 MED ORDER — SODIUM CHLORIDE 0.9 % IV SOLN
INTRAVENOUS | Status: DC
  Administered 2018-05-11 – 2018-05-14 (×6): via INTRAVENOUS

## 2018-05-11 NOTE — Progress Notes (Addendum)
Patient admitted directly from our office this morning for complicated diverticulitis with localized perforation at CT scan.  Failed 12 days of outpatient management.  Being admitted for IV antibiotics.  Starting on Zosyn.  Will hold off with surgical consult for now and observe with medical management.  Will only have him seen here if he fails to improve.  Otherwise can be seen as outpatient.  Will plan for repeat CT scan at some point to confirm resolution and be sure no need for percutaneous drainage.  Note by [name], PA-C will serve as consult note and we will see the patient in follow-up on 9/13.

## 2018-05-11 NOTE — Progress Notes (Addendum)
Chief Complaint: Complicated diverticulitis  HPI:    Jorge Peterson is a 57 year old Caucasian male with a past medical history of diverticulitis and others listed below, who follows with Dr. Hilarie Fredrickson and presents to clinic today for acute diverticulitis.      05/02/2018 patient called our office to report that he had started with another episode of diverticulitis.  He had started Augmentin that had been previously been prescribed for him the day before and was feeling some better the very next day.  This was noted to be his third attack since February.    05/04/2018 patient called our clinic and explains some continued symptoms including upper abdominal pain and bloating as well as diarrhea which were different from his usual attacks which usually consisted of LLQ pain and constipation.  Patient was told to start Florastor 250 mg twice daily.    He continued with symptoms and had CT abdomen pelvis 05/09/2018 which showed sigmoid diverticulitis associated 3.2 cm ill-defined fluid/gas collection with inflammatory stranding along the sigmoid mesocolon, reflecting localized perforation.  There was no well-defined rim to suggest a drainable fluid collection/abscess.  No free air.  Patient was then told to come in to clinic and would likely need to be admitted after failing a week of outpatient antibiotics.    Today, presents to clinic accompanied by his wife and explains that this episode of diverticulitis just "was not the same as my 15 other episodes".  Tells me that when it started he did have a fever of around 101 for about 2 days which was abnormal and also has been having 7-8 loose urgent stools and bloating as well as some described epigastric pain as well.  Patient tells me that when he eats he looks like he is pregnant.  This is now day 12 of his Augmentin and this is the first day that he has felt any better, but per him and his wife he has not had anything to eat today, which usually triggers all of his  symptoms.  Does describe 15 previous episodes with 2 previous hospitalizations for IV antibiotics, the last 2 years ago.      Denies weight loss, anorexia, nausea, vomiting, heartburn or reflux.  Past Medical History:  Diagnosis Date  . Atrial fibrillation ([place])    s/p afib ablation 7/12  . Bradycardia    asymptomatic  . Diabetes ([place])   . Diverticulitis   . Dysrhythmia   . Elevated LFTs   . Fall   . Family history of anesthesia complication    father gets pneumonia after "  . GERD (gastroesophageal reflux disease)   . Hyperlipidemia   . Hyperplastic colon polyp   . Hypertension   . Internal hemorrhoids   . Tinnitus   . Tubular adenoma of colon   . Wears glasses     Past Surgical History:  Procedure Laterality Date  . ATRIAL ABLATION SURGERY  03/24/11   PVI by Greggory Brandy  . BACK SURGERY    . BRONCHOSCOPY  01/23/2013    biopsy  . CHOLECYSTECTOMY  05/02/2014   LAP CHOLE  . CHOLECYSTECTOMY N/A 05/02/2014   Procedure: LAPAROSCOPIC CHOLECYSTECTOMY WITH INTRAOPERATIVE CHOLANGIOGRAM;  Surgeon: Erroll Luna, MD;  Location: [facility];  Service: General;  Laterality: N/A;  . EP IMPLANTABLE DEVICE N/A 06/27/2015   Procedure: Loop Recorder Insertion;  Surgeon: [name], MD;  Location: [facility];  Service: Cardiovascular;  Laterality: N/A;  . ERCP N/A 05/25/2014   Procedure: ENDOSCOPIC RETROGRADE CHOLANGIOPANCREATOGRAPHY (ERCP);  Surgeon: [name], MD;  Location: [facility];  Service: Endoscopy;  Laterality: N/A;  . Fistula surgery    . MEDIASTINOSCOPY N/A 01/23/2013   Procedure: MEDIASTINOSCOPY;  Surgeon: Ivin Poot, MD;  Location: [facility];  Service: Thoracic;  Laterality: N/A;  . QUADRICEPS TENDON REPAIR Right 01/13/2016   Procedure: REPAIR QUADRICEP TENDON;  Surgeon: Latanya Maudlin, MD;  Location: [facility];  Service: Orthopedics;  Laterality: Right;  Received femoral nerve block in holding  . SHOULDER SURGERY     x 2  . TONSILLECTOMY    . VIDEO BRONCHOSCOPY WITH  ENDOBRONCHIAL ULTRASOUND N/A 01/23/2013   Procedure: VIDEO BRONCHOSCOPY WITH ENDOBRONCHIAL ULTRASOUND;  Surgeon: Ivin Poot, MD;  [facility]: [facility];  Service: Thoracic;  Laterality: N/A;    Current Outpatient Medications  Medication Sig Dispense Refill  . amoxicillin-clavulanate (AUGMENTIN) 875-125 MG tablet Take 1 tablet by mouth 2 (two) times daily. 14 tablet 0  . Biotin 1000 MCG tablet Take 1,000 mcg by mouth daily.    . Coenzyme Q10 (COQ10) 150 MG CAPS Take 150 mg by mouth daily.     . fish oil-omega-3 fatty acids 1000 MG capsule Take 1-2 g by mouth 2 (two) times daily. Take two capsules in the morning and one capsule in the evening.    . flecainide (TAMBOCOR) 50 MG tablet TAKE 1 TABLET BY MOUTH TWICE A DAY 180 tablet 3  . fluticasone (FLONASE) 50 MCG/ACT nasal spray Place 1 spray into the nose daily as needed for allergies.     . folic acid (FOLVITE) 921 MCG tablet Take 400 mcg by mouth daily.      . hydrochlorothiazide (MICROZIDE) 12.5 MG capsule Take 12.5 mg by mouth daily.    [name] losartan (COZAAR) 100 MG tablet Take 100 mg by mouth daily.    . metFORMIN (GLUCOPHAGE-XR) 500 MG 24 hr tablet Take 500 mg by mouth daily with breakfast.    . metoprolol succinate (TOPROL-XL) 25 MG 24 hr tablet Take 0.5 tablets (12.5 mg total) by mouth daily. 45 tablet 3  . Multiple Vitamin (MULTIVITAMIN) tablet Take 1 tablet by mouth daily.     [name] omeprazole (PRILOSEC) 40 MG capsule Take 1 capsule (40 mg total) by mouth daily. 90 capsule 2  . simvastatin (ZOCOR) 20 MG tablet TAKE 1 TABLET BY MOUTH EVERYDAY AT BEDTIME 90 tablet 3   No current facility-administered medications for this visit.     Allergies as of 05/11/2018 - Review Complete 05/11/2018  Allergen Reaction Noted  . Codeine Itching and Rash 04/13/2014  . Latex Itching and Rash 01/09/2016  . Oxycodone-acetaminophen Itching, Rash, and Other (See Comments)   . Oxycodone-acetaminophen Itching and Rash 01/25/2013    Family History  Problem  Relation Age of Onset  . Liver disease Mother        fatty liver  . Diabetes Father   . Heart disease Father        CABG at 34    Social History   Socioeconomic History  . Marital status: Married    Spouse name: Not on file  . Number of children: 3  . Years of education: Not on file  . Highest education level: Not on file  Occupational History  . 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 Financial resource strain: Not on file  . Food insecurity:    Worry: Not on file    Inability: Not on file  . Transportation needs:  Medical: Not on file    Non-medical: Not on file  Tobacco Use  . Smoking status: Never Smoker  . Smokeless tobacco: Never Used  Substance and Sexual Activity  . Alcohol use: No  . Drug use: No  . Sexual activity: Yes  Lifestyle  . Physical activity:    Days per week: Not on file    Minutes per session: Not on file  . Stress: Not on file  Relationships  . Social connections:    Talks on phone: Not on file    Gets together: Not on file    Attends religious service: Not on file    Active member of club or organization: Not on file    Attends meetings of clubs or organizations: Not on file    Relationship status: Not on file  . Intimate partner violence:    Fear of current or ex partner: Not on file    Emotionally abused: Not on file    Physically abused: Not on file    Forced sexual activity: Not on file  Other Topics Concern  . Not on file  Social History Narrative   Lives in [place].  3 children are all healthy.   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   No siblings with CAD.    Review of Systems:    Constitutional: No weight loss, fever or chills Skin: No rash  Cardiovascular: No chest pain Respiratory: No SOB  Gastrointestinal: See HPI and otherwise negative Genitourinary: No dysuria Neurological: No headache, dizziness or syncope Musculoskeletal: No new muscle or  joint pain Hematologic: No bleeding  Psychiatric: No history of depression or anxiety   Physical Exam:  Vital signs: BP 122/70   Pulse 76   Ht 5\' 6"  (1.676 m)   Wt 215 lb (97.5 kg)   BMI 34.70 kg/m   Constitutional:   Pleasant Caucasian male appears to be in NAD, Well developed, Well nourished, alert and cooperative Respiratory: Respirations even and unlabored. Lungs clear to auscultation bilaterally.   No wheezes, crackles, or rhonchi.  Cardiovascular: Normal S1, S2. No MRG. Regular rate and rhythm. No peripheral edema, cyanosis or pallor.  Gastrointestinal:  Soft, nondistended, mild epigastric ttp, Marked LLQ ttp with involuntary guarding. Normal bowel sounds. No appreciable masses or hepatomegaly. Psychiatric:  Demonstrates good judgement and reason without abnormal affect or behaviors.  RECENT IMAGING: EXAM: CT ABDOMEN AND PELVIS WITH CONTRAST  TECHNIQUE: Multidetector CT imaging of the abdomen and pelvis was performed using the standard protocol following bolus administration of intravenous contrast.  CONTRAST:  15mL ISOVUE-300 IOPAMIDOL (ISOVUE-300) INJECTION 61%  COMPARISON:  05/24/2014  FINDINGS: Lower chest: Lung bases are clear.  Hepatobiliary: Liver is within normal limits.  Status post cholecystectomy. No intrahepatic or extrahepatic ductal dilatation.  Pancreas: Within normal limits.  Spleen: Within normal limits.  Adrenals/Urinary Tract: Adrenal glands within normal limits.  9 mm medial left upper pole renal cyst. Right kidney is within normal limits. No hydronephrosis.  Bladder is underdistended but unremarkable.  Stomach/Bowel: Stomach is within normal limits.  No evidence of bowel obstruction.  Normal appendix (series 2/image 70).  Sigmoid diverticulitis.  Associated 2.7 x 2.6 x 3.2 cm fluid/gas collection in the pericolonic soft tissues along the sigmoid mesocolon (series 2/image 67), with surrounding inflammatory changes  but without well-defined rim, reflecting localized perforation. However, this does not reflect a drainable fluid collection/abscess. No free air.  Vascular/Lymphatic: No evidence of abdominal aortic aneurysm.  Mild atherosclerotic calcifications the abdominal aorta and branch  vessels.  Small retroperitoneal lymph nodes which do not meet pathologic CT size criteria, likely reactive.  Reproductive: Prostate is grossly unremarkable.  Other: No abdominopelvic ascites.  Musculoskeletal: Degenerative changes of the visualized thoracolumbar spine.  IMPRESSION: Sigmoid diverticulitis. Associated 3.2 cm ill-defined fluid/gas collection with inflammatory stranding along the sigmoid mesocolon, reflecting localized perforation. However, there is no well-defined rim to suggest a drainable fluid collection/abscess. No free air.   Electronically Signed   By: Julian Hy M.D.   On: 05/09/2018 17:27   Assessment: 1.  Complicated diverticulitis: Patient has failed 12 days of outpatient Augmentin, this is his 16th episode of diverticulitis in total and his third episode since February of this year, 2 previous episodes have required hospitalization for IV antibiotics, the last 2 years ago, recent CT as above showing localized perforation, patient continues with left lower quadrant pain and bloating when he eats  Plan: 1.  Spoke with the hospitalist team who will be directly admitting the patient for IV antibiotics. 2.  Spoke with the patient.  We will request a surgery consult during this hospitalization so that he can discuss surgical options now versus in the future. 3.  Discussed with Jorge Bogus, PA-C who is covering our inpatient consult service at [facility] this week.  She will be by to see the patient today. 4.  Please await any further recommendations from our inpatient team.  Jorge Newer, PA-C [facility] 05/11/2018, 9:55 AM  Cc: [name], MD   Addendum: Reviewed and agree with initial management. Admission for IV abx for management of complicated diverticulitis. I expect he will need surgical consultation as an outpatient assuming he recovers uneventfully with medical management. Pyrtle, Lajuan Lines, MD

## 2018-05-11 NOTE — H&P (Signed)
HPI  Jorge Peterson UVO:[id number] DOB: Nov 07, 1960 DOA: 05/11/2018  PCP: [name], MD   Chief Complaint: Abdominal pain fever  HPI:  58 year old male known history of diverticulitis many times, right quadriceps tendon rupture 2017, A. fib previously on flecainide-status post A. fib ablation 7425 complicated by bradycardia, obesity--chads score 2 on Xarelto, prior CBD obstruction choledocholithiasis and ERCP sphincterectomy 05/25/2014, lung nodule biopsied 12/2012-inflammatory, reflux, HTN, hyperlipidemia He had multiple bouts of diverticulitis starting 05/02/2018 return to their office 05/04/2018 and had a CT abdomen pelvis 05/09/2018 showing sigmoid diverticulitis with 3.2 cm ill-defined gas fluid collection with stranding, fever 100 and loose bowel movements despite being on day 12 of Augmentin that was prescribed Has a history of 2 previous hospitalizations with IV antibiotics for the same issue  He was directly admitted after we discussed with [facility] about his case    interim: Wife reports that he feels better than he did over the past couple of days he has been having significant diarrhea which has been inconsistently soft and varies in color however he has persisting left lower quadrant pain which is sine qua non of his usual diverticulitis Tells me that he has no other subjective complaints as below  Review of Systems:   Negative for current fever, visual changes, sore throat, rash, new muscle aches, chest pain, SOB, dysuria, bleeding, n/v/abdominal pain.  Past Medical History:  Diagnosis Date  . Atrial fibrillation ([place])    s/p afib ablation 7/12  . Bradycardia    asymptomatic  . Diabetes ([place])   . Diverticulitis   . Dysrhythmia   . Elevated LFTs   . Fall   . Family history of anesthesia complication    father gets pneumonia after "  . GERD (gastroesophageal reflux disease)   . Hyperlipidemia   . Hyperplastic colon polyp   . Hypertension   . Internal  hemorrhoids   . Tinnitus   . Tubular adenoma of colon   . Wears glasses     Past Surgical History:  Procedure Laterality Date  . ATRIAL ABLATION SURGERY  03/24/11   PVI by Greggory Brandy  . BACK SURGERY    . BRONCHOSCOPY  01/23/2013    biopsy  . CHOLECYSTECTOMY  05/02/2014   LAP CHOLE  . CHOLECYSTECTOMY N/A 05/02/2014   Procedure: LAPAROSCOPIC CHOLECYSTECTOMY WITH INTRAOPERATIVE CHOLANGIOGRAM;  Surgeon: Erroll Luna, MD;  Location: [facility];  Service: General;  Laterality: N/A;  . EP IMPLANTABLE DEVICE N/A 06/27/2015   Procedure: Loop Recorder Insertion;  Surgeon: [name], MD;  Location: [facility];  Service: Cardiovascular;  Laterality: N/A;  . ERCP N/A 05/25/2014   Procedure: ENDOSCOPIC RETROGRADE CHOLANGIOPANCREATOGRAPHY (ERCP);  Surgeon: [name], MD;  Location: [facility];  Service: Endoscopy;  Laterality: N/A;  . Fistula surgery    . MEDIASTINOSCOPY N/A 01/23/2013   Procedure: MEDIASTINOSCOPY;  Surgeon: Ivin Poot, MD;  Location: [facility];  Service: Thoracic;  Laterality: N/A;  . QUADRICEPS TENDON REPAIR Right 01/13/2016   Procedure: REPAIR QUADRICEP TENDON;  Surgeon: Latanya Maudlin, MD;  Location: [facility];  Service: Orthopedics;  Laterality: Right;  Received femoral nerve block in holding  . SHOULDER SURGERY     x 2  . TONSILLECTOMY    . VIDEO BRONCHOSCOPY WITH ENDOBRONCHIAL ULTRASOUND N/A 01/23/2013   Procedure: VIDEO BRONCHOSCOPY WITH ENDOBRONCHIAL ULTRASOUND;  Surgeon: Ivin Poot, MD;  Location: [facility];  Service: Thoracic;  Laterality: N/A;     reports that he has never smoked. He has never  used smokeless tobacco. He reports that he does not drink alcohol or use drugs. Mobility:   Allergies  Allergen Reactions  . Codeine Itching and Rash  . Latex Itching and Rash  . Oxycodone-Acetaminophen Itching, Rash and Other (See Comments)    Pt knows this as PERCOCET  . Oxycodone-Acetaminophen Itching and Rash    Family History  Problem Relation Age of Onset  .  Liver disease Mother        fatty liver  . Diabetes Father   . Heart disease Father        CABG at 54     Prior to Admission medications   Medication Sig Start Date End Date Taking? Authorizing Provider  amoxicillin-clavulanate (AUGMENTIN) 875-125 MG tablet Take 1 tablet by mouth 2 (two) times daily. 01/24/18   Pyrtle, Lajuan Lines, MD  Biotin 1000 MCG tablet Take 1,000 mcg by mouth daily.    [provider]  Coenzyme Q10 (COQ10) 150 MG CAPS Take 150 mg by mouth daily.     [provider]  fish oil-omega-3 fatty acids 1000 MG capsule Take 1-2 g by mouth 2 (two) times daily. Take two capsules in the morning and one capsule in the evening.    [provider]  flecainide (TAMBOCOR) 50 MG tablet TAKE 1 TABLET BY MOUTH TWICE A DAY 01/20/18   Allred, Jeneen Rinks, MD  fluticasone (FLONASE) 50 MCG/ACT nasal spray Place 1 spray into the nose daily as needed for allergies.     [provider]  folic acid (FOLVITE) 678 MCG tablet Take 400 mcg by mouth daily.      [provider]  hydrochlorothiazide (MICROZIDE) 12.5 MG capsule Take 12.5 mg by mouth daily.    [provider]  losartan (COZAAR) 100 MG tablet Take 100 mg by mouth daily.    [provider]  metFORMIN (GLUCOPHAGE-XR) 500 MG 24 hr tablet Take 500 mg by mouth daily with breakfast.    [provider]  metoprolol succinate (TOPROL-XL) 25 MG 24 hr tablet Take 0.5 tablets (12.5 mg total) by mouth daily. 02/01/18   Allred, Jeneen Rinks, MD  Multiple Vitamin (MULTIVITAMIN) tablet Take 1 tablet by mouth daily.     [provider]  omeprazole (PRILOSEC) 40 MG capsule Take 1 capsule (40 mg total) by mouth daily. 10/05/17   Pyrtle, Lajuan Lines, MD  simvastatin (ZOCOR) 20 MG tablet TAKE 1 TABLET BY MOUTH EVERYDAY AT BEDTIME 01/20/18   Allred, Jeneen Rinks, MD    Physical Exam:  There were no vitals filed for this visit.   Alert pleasant sitting up in the chair in no distress  Mild dry mucosa no icterus no  pallor  Neck soft supple  Mallampati 2  Obese abdomen/slightly tender in the left lower quadrant without rebound however no megaly  S1-S2 no murmur rub or gallop  Chest is clinically clear  Knee surgery scars on both knees  Neurologically intact pleasant  I have personally reviewed following labs and imaging studies  Labs:   non    Imaging studies:   non   Medical tests:   EKG independently reviewed: no    Test discussed with performing physician:  n   Decision to obtain old records:   n   Review and summation of old records:   n   Active Problems:   * No active hospital problems. *   Assessment/Plan Complicated diverticulitis 3.2 cm abscess-GI aware and will be seeing patient- general surgery will be involved as per them-Place  on IV Zosyn for now and monitor would only keep on clear liquid diet  Atrial fibrillation chads score 1-2 on flecainide-continue flecainide at prior to admission dosing if able to tolerate otherwise will need cardiology input-check labs today and a.m. magnesium and LFTs-keep on monitors temporarily  Prior lung nodule-stable non-malignant   Severity of Illness: The appropriate patient status for this patient is INPATIENT. Inpatient status is judged to be reasonable and necessary in order to provide the required intensity of service to ensure the patient's safety. The patient's presenting symptoms, physical exam findings, and initial radiographic and laboratory data in the context of their chronic comorbidities is felt to place them at high risk for further clinical deterioration. Furthermore, it is not anticipated that the patient will be medically stable for discharge from the hospital within 2 midnights of admission. The following factors support the patient status of inpatient.   " The patient's presenting symptoms include diverticular abscess with propensity to worsen. " The worrisome physical exam findings include tenderness in  abdomen. " The initial radiographic and laboratory data are worrisome because of 3.2 cm abscess. " The chronic co-morbidities include atrial fibrillation.   * I certify that at the point of admission it is my clinical judgment that the patient will require inpatient hospital care spanning beyond 2 midnights from the point of admission due to high intensity of service, high risk for further deterioration and high frequency of surveillance required.*     Lovenox, full code, wife, GI aware    Time spent: 15 minutes  Verlon Au, MD  [facility] Direct contact: [phone] --[place]ia [place]  --[email or web address]; password [email or web address]  7PM-7AM contact night coverage as above  05/11/2018, 10:45 AM

## 2018-05-11 NOTE — Progress Notes (Signed)
Pharmacy Antibiotic Note  Jorge Peterson is a 57 y.o. male admitted on 10/09/3126 with complicated diverticulitis with localized perforation at CT scan Pharmacy has been consulted for zosyn dosing.  Plan: Zosyn 3.375g IV Q8H infused over 4hrs. Pharmacy will sign off and follow peripherally     Temp (24hrs), Avg:97.8 F (36.6 C), Min:97.8 F (36.6 C), Max:97.8 F (36.6 C)  No results for input(s): WBC, CREATININE, LATICACIDVEN, VANCOTROUGH, VANCOPEAK, VANCORANDOM, GENTTROUGH, GENTPEAK, GENTRANDOM, TOBRATROUGH, TOBRAPEAK, TOBRARND, AMIKACINPEAK, AMIKACINTROU, AMIKACIN in the last 168 hours.  CrCl cannot be calculated (Patient's most recent lab result is older than the maximum 21 days allowed.).    Allergies  Allergen Reactions  . Codeine Itching and Rash  . Latex Itching and Rash  . Oxycodone-Acetaminophen Itching and Rash    Antimicrobials this admission: 9/12 zosyn >>   Microbiology results: None  Thank you for allowing pharmacy to be a part of this patient's care.  Dolly Rias RPh 05/11/2018, 1:22 PM Pager [phone]

## 2018-05-11 NOTE — Patient Instructions (Signed)
Please go to [facility] to be admitted

## 2018-05-12 LAB — COMPREHENSIVE METABOLIC PANEL
ALK PHOS: 67 U/L (ref 38–126)
ALT: 20 U/L (ref 0–44)
AST: 19 U/L (ref 15–41)
Albumin: 3.2 g/dL — ABNORMAL LOW (ref 3.5–5.0)
Anion gap: 11 (ref 5–15)
BILIRUBIN TOTAL: 0.9 mg/dL (ref 0.3–1.2)
BUN: 13 mg/dL (ref 6–20)
CO2: 28 mmol/L (ref 22–32)
CREATININE: 1.2 mg/dL (ref 0.61–1.24)
Calcium: 9.3 mg/dL (ref 8.9–10.3)
Chloride: 103 mmol/L (ref 98–111)
Glucose, Bld: 90 mg/dL (ref 70–99)
Potassium: 3.7 mmol/L (ref 3.5–5.1)
SODIUM: 142 mmol/L (ref 135–145)
TOTAL PROTEIN: 6.5 g/dL (ref 6.5–8.1)

## 2018-05-12 LAB — CBC
HCT: 40.4 % (ref 39.0–52.0)
Hemoglobin: 13.4 g/dL (ref 13.0–17.0)
MCH: 27.6 pg (ref 26.0–34.0)
MCHC: 33.2 g/dL (ref 30.0–36.0)
MCV: 83.1 fL (ref 78.0–100.0)
PLATELETS: 243 10*3/uL (ref 150–400)
RBC: 4.86 MIL/uL (ref 4.22–5.81)
RDW: 12 % (ref 11.5–15.5)
WBC: 6.6 10*3/uL (ref 4.0–10.5)

## 2018-05-12 MED ORDER — ONDANSETRON HCL 4 MG/2ML IJ SOLN: 4.0000 mg | Freq: Four times a day (QID) | INTRAMUSCULAR | Status: DC | PRN

## 2018-05-12 MED ORDER — TRAZODONE HCL 50 MG PO TABS
50.0000 mg | ORAL_TABLET | Freq: Every evening | ORAL | Status: DC | PRN
  Administered 2018-05-12 – 2018-05-13 (×2): 50 mg via ORAL
  Filled 2018-05-12 (×2): qty 1

## 2018-05-12 MED ORDER — SUMATRIPTAN SUCCINATE 25 MG PO TABS
25.0000 mg | ORAL_TABLET | Freq: Two times a day (BID) | ORAL | Status: DC | PRN
  Administered 2018-05-12 – 2018-05-14 (×2): 25 mg via ORAL
  Filled 2018-05-12 (×4): qty 1

## 2018-05-12 NOTE — Consult Note (Signed)
Reason for Consult: Diverticulitis with abscess Referring Physician: Dr Carleene Overlie is an 57 y.o. male.  HPI: 57 y.o. M undergoing outpatient treatment of Diverticulitis by Dr Hilarie Fredrickson but symptoms persisted.  CT scan in ED last night revealed a small pericolonic abscess.  Pt states his pain is actually better than last week.  He is having bowel function.  Last colonoscopy was in 2017.      Past Medical History:  Diagnosis Date  . Atrial fibrillation ([place])    s/p afib ablation 7/12  . Bradycardia    asymptomatic  . Diabetes ([place])   . Diverticulitis   . Dysrhythmia   . Elevated LFTs   . Fall   . Family history of anesthesia complication    father gets pneumonia after "  . GERD (gastroesophageal reflux disease)   . Hyperlipidemia   . Hyperplastic colon polyp   . Hypertension   . Internal hemorrhoids   . Tinnitus   . Tubular adenoma of colon   . Wears glasses     Past Surgical History:  Procedure Laterality Date  . ATRIAL ABLATION SURGERY  03/24/11   PVI by Greggory Brandy  . BACK SURGERY    . BRONCHOSCOPY  01/23/2013    biopsy  . CHOLECYSTECTOMY  05/02/2014   LAP CHOLE  . CHOLECYSTECTOMY N/A 05/02/2014   Procedure: LAPAROSCOPIC CHOLECYSTECTOMY WITH INTRAOPERATIVE CHOLANGIOGRAM;  Surgeon: Erroll Luna, MD;  Location: [facility];  Service: General;  Laterality: N/A;  . EP IMPLANTABLE DEVICE N/A 06/27/2015   Procedure: Loop Recorder Insertion;  Surgeon: [name], MD;  Location: [facility];  Service: Cardiovascular;  Laterality: N/A;  . ERCP N/A 05/25/2014   Procedure: ENDOSCOPIC RETROGRADE CHOLANGIOPANCREATOGRAPHY (ERCP);  Surgeon: [name], MD;  Location: [facility];  Service: Endoscopy;  Laterality: N/A;  . Fistula surgery    . MEDIASTINOSCOPY N/A 01/23/2013   Procedure: MEDIASTINOSCOPY;  Surgeon: Ivin Poot, MD;  Location: [facility];  Service: Thoracic;  Laterality: N/A;  . QUADRICEPS TENDON REPAIR Right 01/13/2016   Procedure: REPAIR QUADRICEP TENDON;  Surgeon:  Latanya Maudlin, MD;  Location: [facility];  Service: Orthopedics;  Laterality: Right;  Received femoral nerve block in holding  . SHOULDER SURGERY     x 2  . TONSILLECTOMY    . VIDEO BRONCHOSCOPY WITH ENDOBRONCHIAL ULTRASOUND N/A 01/23/2013   Procedure: VIDEO BRONCHOSCOPY WITH ENDOBRONCHIAL ULTRASOUND;  Surgeon: Ivin Poot, MD;  [facility];  Service: Thoracic;  Laterality: N/A;    Family History  Problem Relation Age of Onset  . Liver disease Mother        fatty liver  . Diabetes Father   . Heart disease Father        CABG at 59    Social History:  reports that he has never smoked. He has never used smokeless tobacco. He reports that he does not drink alcohol or use drugs.  Allergies:  Allergies  Allergen Reactions  . Codeine Itching and Rash  . Latex Itching and Rash  . Oxycodone-Acetaminophen Itching and Rash    Medications: I have reviewed the patient's current medications.  Results for orders placed or performed during the hospital encounter of 05/11/18 (from the past 48 hour(s))  Comprehensive metabolic panel     Status: Abnormal   Collection Time: 05/11/18 11:37 AM  Result Value Ref Range   Sodium 141 135 - 145 mmol/L   Potassium 3.9 3.5 - 5.1 mmol/L   Chloride 100 98 - 111 mmol/L  CO2 29 22 - 32 mmol/L   Glucose, Bld 107 (H) 70 - 99 mg/dL   BUN 14 6 - 20 mg/dL   Creatinine, Ser 1.14 0.61 - 1.24 mg/dL   Calcium 9.7 8.9 - 10.3 mg/dL   Total Protein 7.2 6.5 - 8.1 g/dL   Albumin 3.6 3.5 - 5.0 g/dL   AST 20 15 - 41 U/L   ALT 20 0 - 44 U/L   Alkaline Phosphatase 73 38 - 126 U/L   Total Bilirubin 0.8 0.3 - 1.2 mg/dL   GFR calc non Af Amer >60 >60 mL/min   GFR calc Af Amer >60 >60 mL/min    Comment: (NOTE) The eGFR has been calculated using the CKD EPI equation. This calculation has not been validated in all clinical situations. eGFR's persistently <60 mL/min signify possible Chronic Kidney Disease.    Anion gap 12 5 - 15    Comment: Performed at [facility], [place] [address], [place], [place] [address]  Magnesium     Status: None   Collection Time: 05/11/18 11:37 AM  Result Value Ref Range   Magnesium 2.3 1.7 - 2.4 mg/dL    Comment: Performed at [facility], [place] [address] [place], [place] [address]  CBC WITH DIFFERENTIAL     Status: None   Collection Time: 05/11/18 11:37 AM  Result Value Ref Range   WBC 7.2 4.0 - 10.5 K/uL   RBC 4.95 4.22 - 5.81 MIL/uL   Hemoglobin 13.7 13.0 - 17.0 g/dL   HCT 41.5 39.0 - 52.0 %   MCV 83.8 78.0 - 100.0 fL   MCH 27.7 26.0 - 34.0 pg   MCHC 33.0 30.0 - 36.0 g/dL   RDW 12.2 11.5 - 15.5 %   Platelets 275 150 - 400 K/uL   Neutrophils Relative % 55 %   Neutro Abs 4.0 1.7 - 7.7 K/uL   Lymphocytes Relative 36 %   Lymphs Abs 2.6 0.7 - 4.0 K/uL   Monocytes Relative 7 %   Monocytes Absolute 0.5 0.1 - 1.0 K/uL   Eosinophils Relative 2 %   Eosinophils Absolute 0.1 0.0 - 0.7 K/uL   Basophils Relative 0 %   Basophils Absolute 0.0 0.0 - 0.1 K/uL    Comment: Performed at [facility], [place] [address], [place], [place] [address]  Comprehensive metabolic panel     Status: Abnormal   Collection Time: 05/12/18  5:08 AM  Result Value Ref Range   Sodium 142 135 - 145 mmol/L   Potassium 3.7 3.5 - 5.1 mmol/L   Chloride 103 98 - 111 mmol/L   CO2 28 22 - 32 mmol/L   Glucose, Bld 90 70 - 99 mg/dL   BUN 13 6 - 20 mg/dL   Creatinine, Ser 1.20 0.61 - 1.24 mg/dL   Calcium 9.3 8.9 - 10.3 mg/dL   Total Protein 6.5 6.5 - 8.1 g/dL   Albumin 3.2 (L) 3.5 - 5.0 g/dL   AST 19 15 - 41 U/L   ALT 20 0 - 44 U/L   Alkaline Phosphatase 67 38 - 126 U/L   Total Bilirubin 0.9 0.3 - 1.2 mg/dL   GFR calc non Af Amer >60 >60 mL/min   GFR calc Af Amer >60 >60 mL/min    Comment: (NOTE) The eGFR has been calculated using the CKD EPI equation. This calculation has not been validated in all clinical situations. eGFR's persistently <60 mL/min signify possible Chronic  Kidney Disease.  Anion gap 11 5 - 15    Comment: Performed at [facility], [place] [address] [place], [place] [address]  CBC     Status: None   Collection Time: 05/12/18  5:08 AM  Result Value Ref Range   WBC 6.6 4.0 - 10.5 K/uL   RBC 4.86 4.22 - 5.81 MIL/uL   Hemoglobin 13.4 13.0 - 17.0 g/dL   HCT 40.4 39.0 - 52.0 %   MCV 83.1 78.0 - 100.0 fL   MCH 27.6 26.0 - 34.0 pg   MCHC 33.2 30.0 - 36.0 g/dL   RDW 12.0 11.5 - 15.5 %   Platelets 243 150 - 400 K/uL    Comment: Performed at [facility], [place] [address] [place], [place] [address]    No results found.  Review of Systems  Constitutional: Positive for fever. Negative for chills.  Eyes: Negative for blurred vision and double vision.  Respiratory: Negative for cough and shortness of breath.   Cardiovascular: Negative for chest pain and palpitations.  Gastrointestinal: Positive for abdominal pain and diarrhea. Negative for nausea and vomiting.  Genitourinary: Negative for dysuria and urgency.  Skin: Negative for itching and rash.   Blood pressure 124/66, pulse (!) 47, temperature (!) 97.5 F (36.4 C), temperature source Oral, resp. rate 17, SpO2 96 %. Physical Exam  Constitutional: He is oriented to person, place, and time. He appears well-developed and well-nourished.  HENT:  Head: Normocephalic and atraumatic.  Eyes: Pupils are equal, round, and reactive to light. Conjunctivae and EOM are normal.  Neck: Normal range of motion. Neck supple.  Cardiovascular: Normal rate and regular rhythm.  Respiratory: Effort normal. No respiratory distress.  GI: Soft. There is tenderness (LLQ). There is no rebound and no guarding.  Musculoskeletal: Normal range of motion.  Neurological: He is alert and oriented to person, place, and time.  Skin: Skin is warm and dry.    Assessment/Plan: 57 y.o. M with locally perforated diverticulitis.  I recommend continuing IV abx for now.  Would plan on repeat  CT on Mon to re-eval abscess.  Advance diet as tolerated.  Pt should see me back in the office at discharge to discuss elective colectomy.      Ocie Tino C. 8/75/6433, 2:95 AM

## 2018-05-12 NOTE — Care Management (Signed)
57 y.o. M with locally perforated diverticulitis  Discharge Readiness Milestones  (1st Day of chart review)  MCG Used: diverticulitis Optimal GLOS: 2 Hospital Day: 1  Discharge Readiness Milestones Hospitalization Return to top of Diverticulitis, Acute RRG - ISC Goal Length of Stay: Ambulatory or 2 days  Note: Goal Length of Stay assumes optimal recovery, decision making, and care. Patients may be discharged to a lower level of care (either later than or sooner than the goal) when it is appropriate for their clinical status and care needs. Discharge Readiness Return to top of Diverticulitis, Acute RRG - [place]  Discharge readiness is indicated by patient meeting Recovery Milestones, including ALL of the following: ? Hemodynamic stability ? Pain  managed ? Vomiting absent or reduced ? Hematocrit stable ? Surgery not indicated ? Fever absent or reduced ? Oral hydration, medications,[K] and diet ? Iv ns at 75cc/hrs,iv zosyn ? Ambulatory ?   Identified Barriers: Date/Name case discussed with attending MD (if applicable):  Date/name case discussed with supervisor (if applicable):  Date/name case referred to physician advisor (if applicable)   Concurrent Review - Discharge Readiness Milestones, Not Met

## 2018-05-12 NOTE — Progress Notes (Addendum)
     [facility] Progress Note  CC:  Diverticulitis  Subjective:  Feels great actually.  No pain.  Had a loose BM this AM.  Tolerating clear liquids.  Objective:  Vital signs in last 24 hours: Temp:  [97.2 F (36.2 C)-98 F (36.7 C)] 97.5 F (36.4 C) (09/13 0616) Pulse Rate:  [47-52] 47 (09/13 0406) Resp:  [16-20] 17 (09/13 0406) BP: (117-124)/(66-78) 124/66 (09/13 0406) SpO2:  [94 %-97 %] 96 % (09/13 0406) Last BM Date: 05/11/18 General:  Alert, Well-developed, in NAD Heart:  Regular rate and rhythm; no murmurs Pulm:  CTAB.  No increased WOB. Abdomen:  Soft, non-distended.  BS present.  Non-tender.  Extremities:  Without edema. Neurologic:  Alert and oriented x 4;  grossly normal neurologically. Psych:  Alert and cooperative. Normal mood and affect.  Intake/Output from previous day: 09/12 0701 - 09/13 0700 In: 120 [P.O.:120] Out: -  Intake/Output this shift: Total I/O In: 240 [P.O.:240] Out: -   Lab Results: Recent Labs    05/11/18 1137 05/12/18 0508  WBC 7.2 6.6  HGB 13.7 13.4  HCT 41.5 40.4  PLT 275 243   BMET Recent Labs    05/11/18 1137 05/12/18 0508  NA 141 142  K 3.9 3.7  CL 100 103  CO2 29 28  GLUCOSE 107* 90  BUN 14 13  CREATININE 1.14 1.20  CALCIUM 9.7 9.3   LFT Recent Labs    05/12/18 0508  PROT 6.5  ALBUMIN 3.2*  AST 19  ALT 20  ALKPHOS 67  BILITOT 0.9   Assessment / Plan: 1.  Complicated diverticulitis:  Patient had failed 12 days of outpatient Augmentin, this is his 16th episode of diverticulitis in total and his third episode since February of this year, 2 previous episodes have required hospitalization for IV antibiotics, the last 2 years ago, recent CT showing localized perforation.  Continue IV zosyn.  Appreciate surgical consult.  He will follow-up with surgery as an outpatient.  Will advance to full liquids today.  Timing of repeat CT scan TBD pending his course.   LOS: 1 day   Laban Emperor. Zehr  05/12/2018,  9:07 AM   Attending physician's note   I have taken an interval history, reviewed the chart and examined the patient. I agree with the Advanced Practitioner's note, impression, and recommendations as outlined. Clinically much improved and asking to increase PO intake. Tolerating liquids, no abdominal pain, and with positive BM/flatus. No acute events overnight, VSS, afebrile and normal WBC again today. Will plan on continuing IV Abx for now and advance diet today. If still clinically stable, may transition to PO Abx in next 24-48 hours, with inpatient observation x24 hours on PO Abx to ensure tolerating and effective. Agree with surgical recommendations for repeat CT early next week to eval for abscess. ok to ambulate as tolerated around ward.   [address] DO, FACG [phone] office

## 2018-05-12 NOTE — Progress Notes (Signed)
PROGRESS NOTE  Jorge Peterson EXN:[id number] DOB: Jan 08, 1961 DOA: 05/11/2018 PCP: [name], MD  HPI/Recap of past 41 hours: 57 year old male known history of recurrent diverticulitis, paroxysmal A. fib not anticoagulated, hyperlipidemia, GERD, hypertension who presented to [facility] as a direct admit from his GI physician's office.  Was being treated for diverticulitis on oral antibiotics for 12 days with no improvement.  Concern for CT abdomen Pelvis with contrast showing localized perforation.    05/12/2018: Patient seen and examined at his bedside.  He reports pain at his left lower quadrant abdomen on palpation.  Nausea is subsiding.  General surgery following and recommended continue IV antibiotics Zosyn for now and repeat CT abdomen pelvis with contrast on Monday to reevaluate abscess.    Assessment/Plan: Active Problems:   Colonic diverticular abscess  Sigmoid diverticulitis complicated by abscess General surgery following Continue IV Zosyn Repeat CT abdomen and pelvis with contrast on Monday We will continue to monitor fever curve and WBC Obtain CBC in the morning  Chronic diastolic CHF Last 2D echo done on 07/02/2015 revealed LVEF 55 to 60% with normal wall motion and grade 1 diastolic dysfunction Strict I's and O's and daily weight Continue current medications  Hypertension Blood pressures well controlled Continue home antihypertensive medications Continue to monitor vital signs  Migraine headache Imitrex as needed  Paroxysmal A. fib On flecainide Defer anticoagulation to his cardiologist    Code Status: Full code  Family Communication: None at bedside  Disposition Plan: Home possibly after reevaluation on Monday or when general surgery signs off.   Consultants:  General surgery  GI  Procedures:  None  Antimicrobials:  Zosyn  DVT prophylaxis: Subcu Lovenox daily   Objective: Vitals:   05/12/18 0406 05/12/18 0600 05/12/18 0616 05/12/18  1357  BP: 124/66   105/75  Pulse: (!) 47   (!) 43  Resp: 17   16  Temp: (!) 97.5 F (36.4 C) (!) 97.2 F (36.2 C) (!) 97.5 F (36.4 C) 98.1 F (36.7 C)  TempSrc:  Oral Oral   SpO2: 96%   95%    Intake/Output Summary (Last 24 hours) at 05/12/2018 1732 Last data filed at 05/12/2018 1345 Gross per 24 hour  Intake 2201.98 ml  Output -  Net 2201.98 ml   There were no vitals filed for this visit.  Exam:  . General: 57 y.o. year-old male well developed well nourished in no acute distress.  Alert and oriented x3. . Cardiovascular: Regular rate and rhythm with no rubs or gallops.  No thyromegaly or JVD noted.   [name] Respiratory: Clear to auscultation with no wheezes or rales. Good inspiratory effort. . Abdomen: Soft tenderness on palpation of left lower quadrant with normal bowel sounds x4 quadrants. . Musculoskeletal: No lower extremity edema. 2/4 pulses in all 4 extremities. . Skin: No ulcerative lesions noted or rashes, . Psychiatry: Mood is appropriate for condition and setting   Data Reviewed: CBC: Recent Labs  Lab 05/11/18 1137 05/12/18 0508  WBC 7.2 6.6  NEUTROABS 4.0  --   HGB 13.7 13.4  HCT 41.5 40.4  MCV 83.8 83.1  PLT 275 496   Basic Metabolic Panel: Recent Labs  Lab 05/11/18 1137 05/12/18 0508  NA 141 142  K 3.9 3.7  CL 100 103  CO2 29 28  GLUCOSE 107* 90  BUN 14 13  CREATININE 1.14 1.20  CALCIUM 9.7 9.3  MG 2.3  --    GFR: Estimated Creatinine Clearance: 73.4 mL/min (by C-G formula based  on SCr of 1.2 mg/dL). Liver Function Tests: Recent Labs  Lab 05/11/18 1137 05/12/18 0508  AST 20 19  ALT 20 20  ALKPHOS 73 67  BILITOT 0.8 0.9  PROT 7.2 6.5  ALBUMIN 3.6 3.2*   No results for input(s): LIPASE, AMYLASE in the last 168 hours. No results for input(s): AMMONIA in the last 168 hours. Coagulation Profile: No results for input(s): INR, PROTIME in the last 168 hours. Cardiac Enzymes: No results for input(s): CKTOTAL, CKMB, CKMBINDEX, TROPONINI in  the last 168 hours. BNP (last 3 results) No results for input(s): PROBNP in the last 8760 hours. HbA1C: No results for input(s): HGBA1C in the last 72 hours. CBG: No results for input(s): GLUCAP in the last 168 hours. Lipid Profile: No results for input(s): CHOL, HDL, LDLCALC, TRIG, CHOLHDL, LDLDIRECT in the last 72 hours. Thyroid Function Tests: No results for input(s): TSH, T4TOTAL, FREET4, T3FREE, THYROIDAB in the last 72 hours. Anemia Panel: No results for input(s): VITAMINB12, FOLATE, FERRITIN, TIBC, IRON, RETICCTPCT in the last 72 hours. Urine analysis:    Component Value Date/Time   COLORURINE AMBER (A) 05/24/2014 1250   APPEARANCEUR CLOUDY (A) 05/24/2014 1250   LABSPEC 1.014 05/24/2014 1250   PHURINE 5.5 05/24/2014 1250   GLUCOSEU NEGATIVE 05/24/2014 1250   HGBUR TRACE (A) 05/24/2014 1250   BILIRUBINUR MODERATE (A) 05/24/2014 1250   KETONESUR NEGATIVE 05/24/2014 1250   PROTEINUR NEGATIVE 05/24/2014 1250   UROBILINOGEN 1.0 05/24/2014 1250   NITRITE NEGATIVE 05/24/2014 1250   LEUKOCYTESUR NEGATIVE 05/24/2014 1250   Sepsis Labs: @LABRCNTIP (procalcitonin:4,lacticidven:4)  )No results found for this or any previous visit (from the past 240 hour(s)).    Studies: No results found.  Scheduled Meds: . enoxaparin (LOVENOX) injection  40 mg Subcutaneous Q24H  . flecainide  50 mg Oral BID  . metoprolol succinate  12.5 mg Oral QHS  . pantoprazole  40 mg Oral Daily    Continuous Infusions: . sodium chloride 75 mL/hr at 05/12/18 1634  . piperacillin-tazobactam (ZOSYN)  IV 3.375 g (05/12/18 1231)     LOS: 1 day     Kayleen Memos, MD [facility] Pager [phone]  If 7PM-7AM, please contact night-coverage [email or web address] Password [email or web address] 05/12/2018, 5:32 PM

## 2018-05-13 DIAGNOSIS — K5720 Diverticulitis of large intestine with perforation and abscess without bleeding: Principal | ICD-10-CM

## 2018-05-13 LAB — CBC
HEMATOCRIT: 39.4 % (ref 39.0–52.0)
HEMOGLOBIN: 13 g/dL (ref 13.0–17.0)
MCH: 27.4 pg (ref 26.0–34.0)
MCHC: 33 g/dL (ref 30.0–36.0)
MCV: 83.1 fL (ref 78.0–100.0)
Platelets: 248 10*3/uL (ref 150–400)
RBC: 4.74 MIL/uL (ref 4.22–5.81)
RDW: 12 % (ref 11.5–15.5)
WBC: 6.6 10*3/uL (ref 4.0–10.5)

## 2018-05-13 LAB — BASIC METABOLIC PANEL
Anion gap: 10 (ref 5–15)
BUN: 11 mg/dL (ref 6–20)
CO2: 28 mmol/L (ref 22–32)
Calcium: 9.3 mg/dL (ref 8.9–10.3)
Chloride: 107 mmol/L (ref 98–111)
Creatinine, Ser: 1.27 mg/dL — ABNORMAL HIGH (ref 0.61–1.24)
GFR calc Af Amer: >60 mL/min (ref >60)
GFR calc non Af Amer: >60 mL/min (ref >60)
Glucose, Bld: 98 mg/dL (ref 70–99)
Potassium: 4 mmol/L (ref 3.5–5.1)
SODIUM: 145 mmol/L (ref 135–145)

## 2018-05-13 LAB — MAGNESIUM: MAGNESIUM: 2.1 mg/dL (ref 1.7–2.4)

## 2018-05-13 NOTE — Progress Notes (Addendum)
     [facility] Progress Note  CC:  Diverticulitis  Subjective:  Feels great.  Tolerating full liquids slept better last night.  Walking the halls/ambulating.  Objective:  Vital signs in last 24 hours: Temp:  [97.8 F (36.6 C)-98.1 F (36.7 C)] 97.9 F (36.6 C) (09/14 0513) Pulse Rate:  [42-43] 42 (09/14 0513) Resp:  [16-20] 20 (09/14 0513) BP: (105-130)/(69-75) 130/69 (09/14 0513) SpO2:  [93 %-97 %] 97 % (09/14 0520) Last BM Date: (P) 05/12/18 General:  Alert, Well-developed, in NAD Heart:  Regular rate and rhythm; no murmurs Pulm:  CTAB.  No increased WOB. Abdomen:  Soft, non-distended.  BS present.  Non-tender.   Extremities:  Without edema. Neurologic:  Alert and oriented x 4;  grossly normal neurologically. Psych:  Alert and cooperative. Normal mood and affect.  Intake/Output from previous day: 09/13 0701 - 09/14 0700 In: 2202 [P.O.:240; I.V.:1796.7; IV Piggyback:165.3] Out: -   Lab Results: Recent Labs    05/11/18 1137 05/12/18 0508 05/13/18 0516  WBC 7.2 6.6 6.6  HGB 13.7 13.4 13.0  HCT 41.5 40.4 39.4  PLT 275 243 248   BMET Recent Labs    05/11/18 1137 05/12/18 0508 05/13/18 0516  NA 141 142 145  K 3.9 3.7 4.0  CL 100 103 107  CO2 29 28 28   GLUCOSE 107* 90 98  BUN 14 13 11   CREATININE 1.14 1.20 1.27*  CALCIUM 9.7 9.3 9.3   LFT Recent Labs    05/12/18 0508  PROT 6.5  ALBUMIN 3.2*  AST 19  ALT 20  ALKPHOS 67  BILITOT 0.9   Assessment / Plan: 1.Complicated diverticulitis:  Patient hadfailed 12 days of outpatient Augmentin, this is his 16th episode of diverticulitis in total and his third episodesince February of this year, 2 previous episodeshaverequired hospitalization for IV antibiotics, the last 2 years ago, recent CT showing localized perforation.  Continue IV zosyn.  Appreciate surgical consult.  He will follow-up with surgery as an outpatient.  ? When we can advance to soft diet.  Likely repeat CT scan Monday and he  would like to wait to be discharged until it is repeated.   LOS: 2 days   Laban Emperor. Zehr  05/13/2018, 8:51 AM   Attending physician's note   I have taken an interval history, reviewed the chart and examined the patient. I agree with the Advanced Practitioner's note, impression, and recommendations as outlined. Continues to be asymptomatic, tolerating PO, ambulation, and no abdominal pain, fever, chills, n/v, or change in bowel habits. Will continue to slowly advance diet. If still continues to improve, will plan on changing to PO Abx tomorrow with plan for repeat CT on Monday. Appreciate surgical recommendations and plan for outpatient f/u to discuss more definitive long term solution with possible surgical intervention.   [address] DO, FACG [phone] office

## 2018-05-13 NOTE — Progress Notes (Signed)
[facility]:  [phone] General Surgery Progress Note   LOS: 2 days  POD -     Chief Complaint: Abdominal pain  Assessment and Plan: 1.  Diverticulitis with pericolonic abscess (last CT scan 05/09/2018)  Seeing Dr. Hilarie Fredrickson  WBC - 6,600 - 05/13/2018  Zosyn  Doing better - suggest repeating CT scan on Monday to check abscess.  Follow up with Dr. Marcello Moores in the office.  2.  History of A fib 3.  DM  4.  DVT prophylaxis - Lovenox   Active Problems:   Colonic diverticular abscess   Subjective:  Doing better.  Pain is essentially gone.  Having BM's.  Objective:   Vitals:   05/13/18 0513 05/13/18 0520  BP: 130/69   Pulse: (!) 42   Resp: 20   Temp: 97.9 F (36.6 C)   SpO2: 93% 97%     Intake/Output from previous day:  09/13 0701 - 09/14 0700 In: 2202 [P.O.:240; I.V.:1796.7; IV Piggyback:165.3] Out: -   Intake/Output this shift:  No intake/output data recorded.   Physical Exam:   General: Heavy WM who is alert and oriented.    HEENT: Normal. Pupils equal. .   Lungs: Clear   Abdomen: Soft.  BS present.   Lab Results:    Recent Labs    05/12/18 0508 05/13/18 0516  WBC 6.6 6.6  HGB 13.4 13.0  HCT 40.4 39.4  PLT 243 248    BMET   Recent Labs    05/12/18 0508 05/13/18 0516  NA 142 145  K 3.7 4.0  CL 103 107  CO2 28 28  GLUCOSE 90 98  BUN 13 11  CREATININE 1.20 1.27*  CALCIUM 9.3 9.3    PT/INR  No results for input(s): LABPROT, INR in the last 72 hours.  ABG  No results for input(s): PHART, HCO3 in the last 72 hours.  Invalid input(s): PCO2, PO2   Studies/Results:  No results found.   Anti-infectives:   Anti-infectives (From admission, onward)   Start     Dose/Rate Route Frequency Ordered Stop   05/11/18 1200  piperacillin-tazobactam (ZOSYN) IVPB 3.375 g     3.375 g 12.5 mL/hr over 240 Minutes Intravenous Every 8 hours 05/11/18 1156        Alphonsa Overall, MD, FACS Pager: [facility]  Office: [phone] 05/13/2018

## 2018-05-13 NOTE — Progress Notes (Signed)
PROGRESS NOTE  AKSHAJ BESANCON NOB:[id number] DOB: 1961-04-14 DOA: 05/11/2018 PCP: [name], MD  Brief Narrative: 57 year old male known history of recurrent diverticulitis, paroxysmal A. fib not anticoagulated, hyperlipidemia, GERD, hypertension who presented to [facility] as a direct admit from his GI physician's office.  Was being treated for diverticulitis on oral antibiotics for 12 days with no improvement.    Interval history/Subjective: Patient seen and evaluated at bedside denies any abdominal pain or nausea  Assessment/Plan Diverticulitis surgery and gastroenterology on board they recommend repeating his CT scan on Monday Continue IV Zosyn He feels good he is tolerating his food well he was able to ambulate the hallway    Chronic diastolic congestive heart failure  We will continue daily weights and strict intake and output   DVT prophylaxis: Lovenox Code Status: Full code Family Communication: Family Disposition Plan: Home  Dr. Kyung Bacca [facility] Pager [phone]  05/13/2018, 10:52 AM  LOS: 2 days   Consultants:  Surgery and gastroenterology  Procedures:  None  Antimicrobials:  Zosyn   Objective: Vitals:  Vitals:   05/13/18 0513 05/13/18 0520  BP: 130/69   Pulse: (!) 42   Resp: 20   Temp: 97.9 F (36.6 C)   SpO2: 93% 97%    Exam:  Constitutional:  . Appears calm and comfortable Eyes:  . pupils and irises appear normal . Normal lids and conjunctivae ENMT:  . grossly normal hearing  . Lips appear normal . external ears, nose appear normal . Oropharynx: mucosa, tongue,posterior pharynx appear normal Neck:  . neck appears normal, no masses, normal ROM, supple . no thyromegaly Respiratory:  . CTA bilaterally, no w/r/r.  . Respiratory effort normal. No retractions or accessory muscle use Cardiovascular:  . RRR, no m/r/g . No LE extremity edema   . Normal pedal pulses Abdomen:  . Abdomen appears normal; normoactive bowel sounds mild  tenderness ,no masses . No hernias . No HSM Musculoskeletal:  . Digits/nails BUE: no clubbing, cyanosis, petechiae, infection . exam of joints, bones, muscles of at least one of following: head/neck, RUE, LUE, RLE, LLE   o strength and tone normal, no atrophy, no abnormal movements o No tenderness, masses o Normal ROM, no contractures  . gait and station Skin:  . No rashes, lesions, ulcers . palpation of skin: no induration or nodules Neurologic:  . CN 2-12 intact . Sensation all 4 extremities intact Psychiatric:  . Mental status o Mood, affect appropriate o Orientation to person, place, time  . judgment and insight appear intact     I have personally reviewed the following:   Data:Results for ROMNEY, COMPEAN (MRN [id number]) as of 05/13/2018 19:20  Ref. Range 05/13/2018 12:75  BASIC METABOLIC PANEL Unknown Rpt (A)  Sodium Latest Ref Range: 135 - 145 mmol/L 145  Potassium Latest Ref Range: 3.5 - 5.1 mmol/L 4.0  Chloride Latest Ref Range: 98 - 111 mmol/L 107  CO2 Latest Ref Range: 22 - 32 mmol/L 28  Glucose Latest Ref Range: 70 - 99 mg/dL 98  BUN Latest Ref Range: 6 - 20 mg/dL 11  Creatinine Latest Ref Range: 0.61 - 1.24 mg/dL 1.27 (H)  Calcium Latest Ref Range: 8.9 - 10.3 mg/dL 9.3  Anion gap Latest Ref Range: 5 - 15  10  Magnesium Latest Ref Range: 1.7 - 2.4 mg/dL 2.1  GFR, Est Non African American Latest Ref Range: >60 mL/min >60  GFR, Est African American Latest Ref Range: >60 mL/min >60     Scheduled Meds: .  enoxaparin (LOVENOX) injection  40 mg Subcutaneous Q24H  . flecainide  50 mg Oral BID  . metoprolol succinate  12.5 mg Oral QHS  . pantoprazole  40 mg Oral Daily   Continuous Infusions: . sodium chloride 75 mL/hr at 05/13/18 0509  . piperacillin-tazobactam (ZOSYN)  IV 3.375 g (05/13/18 0450)    Active Problems:   Colonic diverticular abscess   LOS: 2 days

## 2018-05-14 LAB — BASIC METABOLIC PANEL
ANION GAP: 8 (ref 5–15)
BUN: 9 mg/dL (ref 6–20)
CHLORIDE: 109 mmol/L (ref 98–111)
CO2: 28 mmol/L (ref 22–32)
Calcium: 9 mg/dL (ref 8.9–10.3)
Creatinine, Ser: 1.2 mg/dL (ref 0.61–1.24)
GFR calc non Af Amer: >60 mL/min (ref >60)
Glucose, Bld: 123 mg/dL — ABNORMAL HIGH (ref 70–99)
POTASSIUM: 3.7 mmol/L (ref 3.5–5.1)
SODIUM: 145 mmol/L (ref 135–145)

## 2018-05-14 LAB — MAGNESIUM: MAGNESIUM: 2.1 mg/dL (ref 1.7–2.4)

## 2018-05-14 MED ORDER — CIPROFLOXACIN HCL 500 MG PO TABS
500.0000 mg | ORAL_TABLET | Freq: Two times a day (BID) | ORAL | Status: DC
  Administered 2018-05-14 – 2018-05-15 (×3): 500 mg via ORAL
  Filled 2018-05-14 (×3): qty 1

## 2018-05-14 MED ORDER — PROCHLORPERAZINE EDISYLATE 10 MG/2ML IJ SOLN
10.0000 mg | Freq: Once | INTRAMUSCULAR | Status: AC
  Administered 2018-05-14: 10 mg via INTRAVENOUS
  Filled 2018-05-14: qty 2

## 2018-05-14 MED ORDER — KETOROLAC TROMETHAMINE 15 MG/ML IJ SOLN
15.0000 mg | Freq: Once | INTRAMUSCULAR | Status: AC
  Administered 2018-05-14: 15 mg via INTRAVENOUS
  Filled 2018-05-14: qty 1

## 2018-05-14 MED ORDER — KETOROLAC TROMETHAMINE 15 MG/ML IJ SOLN
INTRAMUSCULAR | Status: AC
  Filled 2018-05-14: qty 1

## 2018-05-14 MED ORDER — METRONIDAZOLE 500 MG PO TABS
500.0000 mg | ORAL_TABLET | Freq: Three times a day (TID) | ORAL | Status: DC
  Administered 2018-05-14 – 2018-05-15 (×4): 500 mg via ORAL
  Filled 2018-05-14 (×4): qty 1

## 2018-05-14 MED ORDER — DIPHENHYDRAMINE HCL 50 MG/ML IJ SOLN
25.0000 mg | Freq: Once | INTRAMUSCULAR | Status: AC
  Administered 2018-05-14: 25 mg via INTRAVENOUS
  Filled 2018-05-14: qty 1

## 2018-05-14 NOTE — Progress Notes (Deleted)
PROGRESS NOTE  Jorge Peterson ZOX:[id number] DOB: 04/16/1961 DOA: 05/11/2018 PCP: [name], MD  Brief Narrative: 57 year old male known history of recurrent diverticulitis, paroxysmal A. fib not anticoagulated, hyperlipidemia, GERD, hypertension who presented to [facility] as a direct admit from his GI physician's office.  Was being treated for diverticulitis on oral antibiotics for 12 days with no improvement.    Interval history/Subjective: Patient seen and evaluated at bedside denies any abdominal pain or nausea  Assessment/Plan Diverticulitis surgery and gastroenterology on board they recommend repeating his CT scan on Monday He was switched to p.o. Cipro today He feels good he is tolerating his food well he was able to ambulate the hallway  Bradycardia.  His pulse is running in the 40s.  He is on metoprolol succinate we will continue to monitor    Chronic diastolic congestive heart failure  We will continue daily weights and strict intake and output   DVT prophylaxis: Lovenox Code Status: Full code Family Communication: Wife Disposition Plan: Home  Dr. Kyung Bacca [facility] Pager [phone]  05/14/2018, 8:33 PM  LOS: 3 days   Consultants:  Surgery and gastroenterology  Procedures:  None  Antimicrobials:  Zosyn   Objective: Vitals:  Vitals:   05/14/18 1617 05/14/18 2007  BP: 115/69 119/60  Pulse: (!) 44 (!) 42  Resp: 18 19  Temp: 98.5 F (36.9 C) 98.7 F (37.1 C)  SpO2: 96% 96%    Exam:  Constitutional:  . Appears calm and comfortable Eyes:  . pupils and irises appear normal . Normal lids and conjunctivae ENMT:  . grossly normal hearing  . Lips appear normal . external ears, nose appear normal . Oropharynx: mucosa, tongue,posterior pharynx appear normal Neck:  . neck appears normal, no masses, normal ROM, supple . no thyromegaly Respiratory:  . CTA bilaterally, no w/r/r.  . Respiratory effort normal. No retractions or accessory muscle  use Cardiovascular:  . RRR, no m/r/g . No LE extremity edema   . Normal pedal pulses Abdomen:  . Abdomen appears normal; normoactive bowel sounds mild tenderness ,no masses . No hernias . No HSM Musculoskeletal:  . Digits/nails BUE: no clubbing, cyanosis, petechiae, infection . exam of joints, bones, muscles of at least one of following: head/neck, RUE, LUE, RLE, LLE   o strength and tone normal, no atrophy, no abnormal movements o No tenderness, masses o Normal ROM, no contractures  . gait and station Skin:  . No rashes, lesions, ulcers . palpation of skin: no induration or nodules Neurologic:  . CN 2-12 intact . Sensation all 4 extremities intact Psychiatric:  . Mental status o Mood, affect appropriate o Orientation to person, place, time  . judgment and insight appear intact     I have personally reviewed the following:   Data:Results for BOLTON, CANUPP (MRN [id number]) as of 05/13/2018 19:20  Ref. Range 05/13/2018 86:57  BASIC METABOLIC PANEL Unknown Rpt (A)  Sodium Latest Ref Range: 135 - 145 mmol/L 145  Potassium Latest Ref Range: 3.5 - 5.1 mmol/L 4.0  Chloride Latest Ref Range: 98 - 111 mmol/L 107  CO2 Latest Ref Range: 22 - 32 mmol/L 28  Glucose Latest Ref Range: 70 - 99 mg/dL 98  BUN Latest Ref Range: 6 - 20 mg/dL 11  Creatinine Latest Ref Range: 0.61 - 1.24 mg/dL 1.27 (H)  Calcium Latest Ref Range: 8.9 - 10.3 mg/dL 9.3  Anion gap Latest Ref Range: 5 - 15  10  Magnesium Latest Ref Range: 1.7 - 2.4 mg/dL 2.1  GFR, Est Non African American Latest Ref Range: >60 mL/min >60  GFR, Est African American Latest Ref Range: >60 mL/min >60     Scheduled Meds: . ciprofloxacin  500 mg Oral BID  . enoxaparin (LOVENOX) injection  40 mg Subcutaneous Q24H  . flecainide  50 mg Oral BID  . metoprolol succinate  12.5 mg Oral QHS  . metroNIDAZOLE  500 mg Oral Q8H  . pantoprazole  40 mg Oral Daily   Continuous Infusions: . sodium chloride 75 mL/hr at 05/14/18 0429     Active Problems:   Colonic diverticular abscess   LOS: 3 days

## 2018-05-14 NOTE — Progress Notes (Addendum)
[facility] [facility]:  [phone] General Surgery Progress Note   LOS: 3 days  POD -     Chief Complaint: Abdominal pain  Assessment and Plan: 1.  Diverticulitis with pericolonic abscess (last CT scan 05/09/2018)  Seeing Dr. Hilarie Fredrickson  WBC - 6,600 - 05/13/2018  Switched to Cipro/Flagyl po today  Doing better - suggest repeating CT scan on Monday to check abscess.  Follow up with Dr. Marcello Moores in the office.  2.  History of A fib 3.  DM 4.  DVT prophylaxis - Lovenox   Active Problems:   Colonic diverticular abscess   Subjective:  Looks good.  No abdominal pain.  Objective:   Vitals:   05/13/18 2030 05/14/18 0513  BP: 130/65 129/67  Pulse: (!) 44 (!) 46  Resp: 18 18  Temp: 97.9 F (36.6 C) 97.8 F (36.6 C)  SpO2: 96% 95%     Intake/Output from previous day:  09/14 0701 - 09/15 0700 In: 2635.2 [P.O.:720; I.V.:1621.2; IV Piggyback:294.1] Out: -   Intake/Output this shift:  No intake/output data recorded.   Physical Exam:   General: Heavy WM who is alert and oriented.    HEENT: Normal. Pupils equal. .   Lungs: Clear   Abdomen: Soft.  BS present.   Lab Results:    Recent Labs    05/12/18 0508 05/13/18 0516  WBC 6.6 6.6  HGB 13.4 13.0  HCT 40.4 39.4  PLT 243 248    BMET   Recent Labs    05/12/18 0508 05/13/18 0516  NA 142 145  K 3.7 4.0  CL 103 107  CO2 28 28  GLUCOSE 90 98  BUN 13 11  CREATININE 1.20 1.27*  CALCIUM 9.3 9.3    PT/INR  No results for input(s): LABPROT, INR in the last 72 hours.  ABG  No results for input(s): PHART, HCO3 in the last 72 hours.  Invalid input(s): PCO2, PO2   Studies/Results:  No results found.   Anti-infectives:   Anti-infectives (From admission, onward)   Start     Dose/Rate Route Frequency Ordered Stop   05/14/18 1400  metroNIDAZOLE (FLAGYL) tablet 500 mg     500 mg Oral Every 8 hours 05/14/18 0948     05/14/18 1000  ciprofloxacin (CIPRO) tablet 500 mg     500 mg Oral 2 times daily 05/14/18  0948     05/11/18 1200  piperacillin-tazobactam (ZOSYN) IVPB 3.375 g  Status:  Discontinued     3.375 g 12.5 mL/hr over 240 Minutes Intravenous Every 8 hours 05/11/18 1156 05/14/18 0948      Alphonsa Overall, MD, FACS Pager: [place] [facility] Office: [phone] 05/14/2018

## 2018-05-14 NOTE — Progress Notes (Addendum)
     [facility] Progress Note  CC:  Diverticulitis   Subjective:  Feels great.  Tolerating soft diet.  Objective:  Vital signs in last 24 hours: Temp:  [97.8 F (36.6 C)-98.1 F (36.7 C)] 97.8 F (36.6 C) (09/15 0513) Pulse Rate:  [42-46] 46 (09/15 0513) Resp:  [18-19] 18 (09/15 0513) BP: (122-130)/(65-77) 129/67 (09/15 0513) SpO2:  [95 %-96 %] 95 % (09/15 0513) Last BM Date: 05/13/18 General:  Alert, Well-developed, in NAD Heart:  Bradycardic; no murmurs Pulm:  CTAB.  No increased WOB. Abdomen:  Soft, non-distended.  BS present.  Non-tender. Extremities:  Without edema. Neurologic:  Alert and oriented x 4;  grossly normal neurologically. Psych:  Alert and cooperative. Normal mood and affect.  Intake/Output from previous day: 09/14 0701 - 09/15 0700 In: 2635.2 [P.O.:720; I.V.:1621.2; IV Piggyback:294.1] Out: -   Lab Results: Recent Labs    05/11/18 1137 05/12/18 0508 05/13/18 0516  WBC 7.2 6.6 6.6  HGB 13.7 13.4 13.0  HCT 41.5 40.4 39.4  PLT 275 243 248   BMET Recent Labs    05/11/18 1137 05/12/18 0508 05/13/18 0516  NA 141 142 145  K 3.9 3.7 4.0  CL 100 103 107  CO2 29 28 28   GLUCOSE 107* 90 98  BUN 14 13 11   CREATININE 1.14 1.20 1.27*  CALCIUM 9.7 9.3 9.3   LFT Recent Labs    05/12/18 0508  PROT 6.5  ALBUMIN 3.2*  AST 19  ALT 20  ALKPHOS 67  BILITOT 0.9   Assessment / Plan: 1.Complicated diverticulitis:Patient hadfailed 12 days of outpatient Augmentin, this is his 16th episode of diverticulitis in total and his third episodesince February of this year, 2 previous episodeshaverequired hospitalization for IV antibiotics, the last 2 years ago, recent CT showing localized perforation.  Will discontinue zosyn today and place him on cipro 500 mg BID and flagyl 500 mg TID, duration TBD pending results of CT scan.  CT scan ordered for tomorrow.  Will follow-up with surgery as outpatient.   LOS: 3 days   Laban Emperor. Zehr   05/14/2018, 9:49 AM   Attending physician's note   I have taken an interval history, reviewed the chart and examined the patient. I agree with the Advanced Practitioner's note, impression, and recommendations as outlined. Continues to do quite well clinically. Will transition to PO Abx today with plan for repeat CT tomorrow for re-evaluation. Dispo planning and duration of Abx pending repeat CT findings. Appreciate surgical recommendations and plan for outpatient f/u to discuss more definitive long term solution with possible surgical intervention.   [id number] DO, FACG [phone] office

## 2018-05-14 NOTE — Progress Notes (Signed)
PROGRESS NOTE  AROLDO GALLI YNW:[id number] DOB: 1960-09-19 DOA: 05/11/2018 PCP: [name], MD  Brief Narrative: 57 year old male known history of recurrent diverticulitis, paroxysmal A. fib not anticoagulated, hyperlipidemia, GERD, hypertension who presented to [facility] as a direct admit from his GI physician's office.  Was being treated for diverticulitis on oral antibiotics for 12 days with no improvement.    Interval history/Subjective: Patient seen and evaluated at bedside denies any abdominal pain or nausea patient wanted to know specific numbers on his blood work including his sugar which were discussed with him  Assessment/Plan Diverticulitis surgery and gastroenterology on board they recommend repeating his CT scan on Monday He was switched from Zosyn IV to oral Cipro today He feels good he is tolerating his food well he was able to ambulate the hallway    Chronic diastolic congestive heart failure  We will continue daily weights and strict intake and output   DVT prophylaxis: Lovenox Code Status: Full code Family Communication: wife Disposition Plan: Home  Dr. Kyung Bacca [facility] Pager [phone]  05/14/2018, 12:08 PM  LOS: 3 days   Consultants:  Surgery and gastroenterology  Procedures:  None  Antimicrobials:  Zosyn   Objective: Vitals:  Vitals:   05/13/18 2030 05/14/18 0513  BP: 130/65 129/67  Pulse: (!) 44 (!) 46  Resp: 18 18  Temp: 97.9 F (36.6 C) 97.8 F (36.6 C)  SpO2: 96% 95%    Exam:  Constitutional:  . Appears calm and comfortable Eyes:  . pupils and irises appear normal . Normal lids and conjunctivae ENMT:  . grossly normal hearing  . Lips appear normal . external ears, nose appear normal . Oropharynx: mucosa, tongue,posterior pharynx appear normal Neck:  . neck appears normal, no masses, normal ROM, supple . no thyromegaly Respiratory:  . CTA bilaterally, no w/r/r.  . Respiratory effort normal. No retractions or  accessory muscle use Cardiovascular:  . RRR, no m/r/g . No LE extremity edema   . Normal pedal pulses Abdomen:  . Abdomen appears normal; normoactive bowel sounds mild tenderness ,no masses . No hernias . No HSM Musculoskeletal:  . Digits/nails BUE: no clubbing, cyanosis, petechiae, infection . exam of joints, bones, muscles of at least one of following: head/neck, RUE, LUE, RLE, LLE   o strength and tone normal, no atrophy, no abnormal movements o No tenderness, masses o Normal ROM, no contractures  . gait and station Skin:  . No rashes, lesions, ulcers . palpation of skin: no induration or nodules Neurologic:  . CN 2-12 intact . Sensation all 4 extremities intact Psychiatric:  . Mental status o Mood, affect appropriate o Orientation to person, place, time  . judgment and insight appear intact     I have personally reviewed the following:   Data:Results for TAVARIS, EUDY (MRN [id number]) as of 05/13/2018 19:20  Ref. Range 05/13/2018 24:40  BASIC METABOLIC PANEL Unknown Rpt (A)  Sodium Latest Ref Range: 135 - 145 mmol/L 145  Potassium Latest Ref Range: 3.5 - 5.1 mmol/L 4.0  Chloride Latest Ref Range: 98 - 111 mmol/L 107  CO2 Latest Ref Range: 22 - 32 mmol/L 28  Glucose Latest Ref Range: 70 - 99 mg/dL 98  BUN Latest Ref Range: 6 - 20 mg/dL 11  Creatinine Latest Ref Range: 0.61 - 1.24 mg/dL 1.27 (H)  Calcium Latest Ref Range: 8.9 - 10.3 mg/dL 9.3  Anion gap Latest Ref Range: 5 - 15  10  Magnesium Latest Ref Range: 1.7 - 2.4 mg/dL 2.1  GFR, Est Non African American Latest Ref Range: >60 mL/min >60  GFR, Est African American Latest Ref Range: >60 mL/min >60     Scheduled Meds: . ciprofloxacin  500 mg Oral BID  . enoxaparin (LOVENOX) injection  40 mg Subcutaneous Q24H  . flecainide  50 mg Oral BID  . metoprolol succinate  12.5 mg Oral QHS  . metroNIDAZOLE  500 mg Oral Q8H  . pantoprazole  40 mg Oral Daily   Continuous Infusions: . sodium chloride 75 mL/hr at  05/14/18 0429    Active Problems:   Colonic diverticular abscess   LOS: 3 days

## 2018-05-15 ENCOUNTER — Inpatient Hospital Stay (HOSPITAL_COMMUNITY): Payer: 59

## 2018-05-15 ENCOUNTER — Encounter (HOSPITAL_COMMUNITY): Payer: Self-pay | Admitting: Radiology

## 2018-05-15 MED ORDER — CIPROFLOXACIN HCL 500 MG PO TABS: 500.0000 mg | ORAL_TABLET | Freq: Two times a day (BID) | ORAL | 0 refills | Status: DC

## 2018-05-15 MED ORDER — IOPAMIDOL (ISOVUE-300) INJECTION 61%
INTRAVENOUS | Status: AC
  Filled 2018-05-15: qty 30

## 2018-05-15 MED ORDER — METRONIDAZOLE 500 MG PO TABS: 500.0000 mg | ORAL_TABLET | Freq: Three times a day (TID) | ORAL | 0 refills | Status: DC

## 2018-05-15 MED ORDER — IOPAMIDOL (ISOVUE-300) INJECTION 61%: 15.0000 mL | Freq: Once | INTRAVENOUS | Status: DC | PRN

## 2018-05-15 MED ORDER — IOHEXOL 300 MG/ML  SOLN
100.0000 mL | Freq: Once | INTRAMUSCULAR | Status: AC | PRN
  Administered 2018-05-15: 100 mL via INTRAVENOUS

## 2018-05-15 NOTE — Care Management Note (Signed)
Case Management Note  Patient Details  Name: Jorge Peterson MRN: [id number] Date of Birth: [birth year]  Subjective/Objective:                  Colonic diverticular abscess  Action/Plan: Following for progression and cm needs-possible surgery later due to abcess.  Expected Discharge Date:  (unknown)               Expected Discharge Plan:  Home/Self Care  In-House Referral:     Discharge planning Services  CM Consult  Post Acute Care Choice:    Choice offered to:     DME Arranged:    DME Agency:     HH Arranged:    HH Agency:     Status of Service:  In process, will continue to follow  If discussed at Long Length of Stay Meetings, dates discussed:    Additional Comments:  Leeroy Cha, RN 05/15/2018, 11:24 AM

## 2018-05-15 NOTE — Discharge Summary (Addendum)
Discharge Summary  Jorge Peterson ZOX:[id number] DOB: 03-06-1961  PCP: [name], MD  Admit date: 05/11/2018 Discharge date: 05/15/2018  Time spent: 25 minutes  Recommendations for Outpatient Follow-up:  1. Follow-up with your PCP 2. Follow-up with GI 3. Follow-up with GI surgery 4. Follow-up with your cardiologist Dr. Rayann Heman within 2 weeks 5. Take your medications as prescribed  Discharge Diagnoses:  Active Hospital Problems   Diagnosis Date Noted  . Colonic diverticular abscess 05/11/2018    Resolved Hospital Problems  No resolved problems to display.    Discharge Condition: Stable  Diet recommendation: Resume previous diet  Vitals:   05/15/18 0510 05/15/18 1341  BP: (!) 141/75 135/79  Pulse: (!) 41 (!) 43  Resp: 16 18  Temp: 97.6 F (36.4 C) (!) 97.4 F (36.3 C)  SpO2: 98% 96%    History of present illness:   57 year old male known history of recurrent diverticulitis, paroxysmal A. fib not anticoagulated, hyperlipidemia, GERD, hypertension who presented to [facility] as a direct admit from his GI physician's office.  Was being treated for diverticulitis on oral antibiotics for 12 days with no improvement.  Concern for CT abdomen Pelvis with contrast showing localized perforation.    05/15/2018: Patient seen and examined at his bedside.  No acute events overnight.  Denies any abdominal pain, nausea, vomiting or diarrhea.  Afebrile with no leukocytosis.  CT abdomen and pelvis with contrast ordered to reassess pericolonic soft tissues abscess.  Only day of discharge, the patient was hemodynamically stable.  He will need to follow-up with his GI physician, general surgery, and PCP posthospitalization.  Hospital Course:  Active Problems:   Colonic diverticular abscess  Recurrent sigmoid diverticulitis complicated by abscess General surgery following Completed 3 days of IV Zosyn Currently on p.o. Flagyl and po ciprofloxacin Repeat CT abdomen and pelvis with  contrast on Monday 05/15/2018 Afebrile with no leukocytosis Follow-up with GI and general surgery post hospitalization  Chronic diastolic CHF Last 2D echo done on 07/02/2015 revealed LVEF 55 to 60% with normal wall motion and grade 1 diastolic dysfunction Strict I's and O's and daily weight Continue current cardiac medications  Hypertension Blood pressures well controlled Continue home antihypertensive medications Follow-up with your PCP  Migraine headache Imitrex as needed  Paroxysmal A. fib On flecainide and metoprolol  Chronic asymptomatic bradycardia Discussed cardiac medications with patient's cardiologist Dr. Rayann Heman on the phone on 05/15/2018.  Okay to continue with current cardiac regimen flecainide 50 mg twice daily and metoprolol 12.5 mg daily as the patient is asymptomatic and his bradycardia is chronic.     Procedures:  None  Consultations:  GI  General surgery  Discharge Exam: BP 135/79   Pulse (!) 43 Comment: nurse notified  Temp (!) 97.4 F (36.3 C) Comment: nurse notified  Resp 18   SpO2 96%  . General: 57 y.o. year-old male well developed well nourished in no acute distress.  Alert and oriented x3. . Cardiovascular: Regular rate and rhythm with no rubs or gallops.  No thyromegaly or JVD noted.   [name] Respiratory: Clear to auscultation with no wheezes or rales. Good inspiratory effort. . Abdomen: Soft nontender nondistended with normal bowel sounds x4 quadrants. . Musculoskeletal: No lower extremity edema. 2/4 pulses in all 4 extremities. . Skin: No ulcerative lesions noted or rashes, . Psychiatry: Mood is appropriate for condition and setting  Discharge Instructions You were cared for by a hospitalist during your hospital stay. If you have any questions about your discharge medications or the  care you received while you were in the hospital after you are discharged, you can call the unit and asked to speak with the hospitalist on call if the  hospitalist that took care of you is not available. Once you are discharged, your primary care physician will handle any further medical issues. Please note that NO REFILLS for any discharge medications will be authorized once you are discharged, as it is imperative that you return to your primary care physician (or establish a relationship with a primary care physician if you do not have one) for your aftercare needs so that they can reassess your need for medications and monitor your lab values.  Discharge Instructions    Face-to-face encounter (required for Medicare/Medicaid patients)   Complete by:  As directed    I Kayleen Memos certify that this patient is under my care and that I, or a nurse practitioner or physician's assistant working with me, had a face-to-face encounter that meets the physician face-to-face encounter requirements with this patient on 05/15/2018. The encounter with the patient was in whole, or in part for the following medical condition(s) which is the primary reason for home health care (List medical condition): RN for management and education of complex disease process.   The encounter with the patient was in whole, or in part, for the following medical condition, which is the primary reason for home health care:  Complex disease process   I certify that, based on my findings, the following services are medically necessary home health services:  Nursing   Reason for Medically Necessary Home Health Services:  Skilled Nursing- Teaching of Disease Process/Symptom Management   My clinical findings support the need for the above services:  OTHER SEE COMMENTS   Further, I certify that my clinical findings support that this patient is homebound due to:  Unable to leave home safely without assistance   Home Health   Complete by:  As directed    To provide the following care/treatments:  RN     Allergies as of 05/15/2018      Reactions   Codeine Itching, Rash   Latex Itching,  Rash   Oxycodone-acetaminophen Itching, Rash      Medication List    STOP taking these medications   amoxicillin-clavulanate 875-125 MG tablet Commonly known as:  AUGMENTIN   hydrochlorothiazide 12.5 MG tablet Commonly known as:  HYDRODIURIL   losartan 100 MG tablet Commonly known as:  COZAAR   omeprazole 40 MG capsule Commonly known as:  PRILOSEC     TAKE these medications   Biotin 1000 MCG tablet Take 1,000 mcg by mouth daily.   ciprofloxacin 500 MG tablet Commonly known as:  CIPRO Take 1 tablet (500 mg total) by mouth 2 (two) times daily for 5 days.   COQ10 150 MG Caps Take 150 mg by mouth every evening.   fish oil-omega-3 fatty acids 1000 MG capsule Take 1-2 g by mouth See admin instructions. Take two capsules in the morning and one capsule in the evening.   flecainide 50 MG tablet Commonly known as:  TAMBOCOR TAKE 1 TABLET BY MOUTH TWICE A DAY   fluticasone 50 MCG/ACT nasal spray Commonly known as:  FLONASE Place 1 spray into the nose daily as needed for allergies.   folic acid 220 MCG tablet Commonly known as:  FOLVITE Take 400 mcg by mouth daily.   metFORMIN 500 MG 24 hr tablet Commonly known as:  GLUCOPHAGE-XR Take 500 mg by mouth daily with supper.  metoprolol succinate 25 MG 24 hr tablet Commonly known as:  TOPROL-XL Take 0.5 tablets (12.5 mg total) by mouth daily. What changed:  when to take this   metroNIDAZOLE 500 MG tablet Commonly known as:  FLAGYL Take 1 tablet (500 mg total) by mouth 3 (three) times daily for 5 days.   multivitamin tablet Take 1 tablet by mouth daily.   saccharomyces boulardii 250 MG capsule Commonly known as:  FLORASTOR Take 250 mg by mouth 2 (two) times daily.   simvastatin 20 MG tablet Commonly known as:  ZOCOR TAKE 1 TABLET BY MOUTH EVERYDAY AT BEDTIME What changed:  See the new instructions.      Allergies  Allergen Reactions  . Codeine Itching and Rash  . Latex Itching and Rash  .  Oxycodone-Acetaminophen Itching and Rash   Follow-up Information    Leighton Ruff, MD. Schedule an appointment as soon as possible for a visit in 2 week(s).   Specialty:  General Surgery Contact information: [address] [place] [address] [phone]        [name], MD. Call in 1 day(s).   Specialty:  Family Medicine Why:  Please call for follow-up appointment. Contact information: [place] [address] [phone]        Alfredia Ferguson, PA-C. Call in 1 day(s).   Specialty:  Gastroenterology Why:  Please call for an appointment. Contact information: [place] [address] [phone]        [name], MD. Call in 1 day(s).   Specialty:  Cardiology Why:  Please call for an appointment which will be scheduled within 2 weeks from discharge. Contact information: [address] [place] [address] [phone]            The results of significant diagnostics from this hospitalization (including imaging, microbiology, ancillary and laboratory) are listed below for reference.    Significant Diagnostic Studies: Ct Abdomen Pelvis W Contrast  Result Date: 05/15/2018 CLINICAL DATA:  Recurrent diverticulitis.  Follow-up abscess. EXAM: CT ABDOMEN AND PELVIS WITH CONTRAST TECHNIQUE: Multidetector CT imaging of the abdomen and pelvis was performed using the standard protocol following bolus administration of intravenous contrast. CONTRAST:  142mL OMNIPAQUE IOHEXOL 300 MG/ML  SOLN COMPARISON:  05/09/2018 FINDINGS: Lower chest: No acute abnormality. Hepatobiliary: No focal liver abnormality identified. Previous cholecystectomy. No biliary dilatation. Pancreas: Unremarkable. No pancreatic ductal dilatation or surrounding inflammatory changes. Spleen: Normal in size without focal abnormality. Adrenals/Urinary Tract: Normal adrenal glands. Unremarkable appearance of the kidneys. No mass or hydronephrosis  identified. Small low attenuation structure within the upper pole of the left kidney measures 9 mm and is too small to reliably characterize. Urinary bladder appears normal. Stomach/Bowel: Stomach is normal. The small bowel loops have a normal course and caliber. No obstruction. The appendix is visualized and appears normal. Distal colonic diverticulosis is again noted. Wall thickening and inflammation involving the sigmoid colon is again identified within the lower abdomen, image 72/2. There is been near complete resolution of previous diverticular abscess. No measurable fluid collection identified at this time. Vascular/Lymphatic: Aortic atherosclerosis. No aneurysm. Left periaortic lymph node measures 9 mm, unchanged, image 50/2. No pelvic adenopathy. Reproductive: Prostate is unremarkable. Other: No abdominal wall hernia or abnormality. No abdominopelvic ascites. Musculoskeletal: Degenerative disc disease identified at L5-S1. IMPRESSION: 1. Sigmoid diverticulitis. 2. Near complete resolution of diverticular abscess/localized perforation within the lower abdomen. No residual fluid collection identified at this time. 3.  Aortic  Atherosclerosis (ICD10-I70.0). Electronically Signed   By: Kerby Moors M.D.   On: 05/15/2018 14:52   Ct Abdomen Pelvis W Contrast  Result Date: 05/09/2018 CLINICAL DATA:  Abdominal pain, bloating, loose stool status post antibiotics x 10 days for diverticulitis EXAM: CT ABDOMEN AND PELVIS WITH CONTRAST TECHNIQUE: Multidetector CT imaging of the abdomen and pelvis was performed using the standard protocol following bolus administration of intravenous contrast. CONTRAST:  133mL ISOVUE-300 IOPAMIDOL (ISOVUE-300) INJECTION 61% COMPARISON:  05/24/2014 FINDINGS: Lower chest: Lung bases are clear. Hepatobiliary: Liver is within normal limits. Status post cholecystectomy. No intrahepatic or extrahepatic ductal dilatation. Pancreas: Within normal limits. Spleen: Within normal limits.  Adrenals/Urinary Tract: Adrenal glands within normal limits. 9 mm medial left upper pole renal cyst. Right kidney is within normal limits. No hydronephrosis. Bladder is underdistended but unremarkable. Stomach/Bowel: Stomach is within normal limits. No evidence of bowel obstruction. Normal appendix (series 2/image 70). Sigmoid diverticulitis. Associated 2.7 x 2.6 x 3.2 cm fluid/gas collection in the pericolonic soft tissues along the sigmoid mesocolon (series 2/image 67), with surrounding inflammatory changes but without well-defined rim, reflecting localized perforation. However, this does not reflect a drainable fluid collection/abscess. No free air. Vascular/Lymphatic: No evidence of abdominal aortic aneurysm. Mild atherosclerotic calcifications the abdominal aorta and branch vessels. Small retroperitoneal lymph nodes which do not meet pathologic CT size criteria, likely reactive. Reproductive: Prostate is grossly unremarkable. Other: No abdominopelvic ascites. Musculoskeletal: Degenerative changes of the visualized thoracolumbar spine. IMPRESSION: Sigmoid diverticulitis. Associated 3.2 cm ill-defined fluid/gas collection with inflammatory stranding along the sigmoid mesocolon, reflecting localized perforation. However, there is no well-defined rim to suggest a drainable fluid collection/abscess. No free air. Electronically Signed   By: Julian Hy M.D.   On: 05/09/2018 17:27    Microbiology: No results found for this or any previous visit (from the past 240 hour(s)).   Labs: Basic Metabolic Panel: Recent Labs  Lab 05/11/18 1137 05/12/18 0508 05/13/18 0516 05/14/18 1233  NA 141 142 145 145  K 3.9 3.7 4.0 3.7  CL 100 103 107 109  CO2 29 28 28 28   GLUCOSE 107* 90 98 123*  BUN 14 13 11 9   CREATININE 1.14 1.20 1.27* 1.20  CALCIUM 9.7 9.3 9.3 9.0  MG 2.3  --  2.1 2.1   Liver Function Tests: Recent Labs  Lab 05/11/18 1137 05/12/18 0508  AST 20 19  ALT 20 20  ALKPHOS 73 67  BILITOT  0.8 0.9  PROT 7.2 6.5  ALBUMIN 3.6 3.2*   No results for input(s): LIPASE, AMYLASE in the last 168 hours. No results for input(s): AMMONIA in the last 168 hours. CBC: Recent Labs  Lab 05/11/18 1137 05/12/18 0508 05/13/18 0516  WBC 7.2 6.6 6.6  NEUTROABS 4.0  --   --   HGB 13.7 13.4 13.0  HCT 41.5 40.4 39.4  MCV 83.8 83.1 83.1  PLT 275 243 248   Cardiac Enzymes: No results for input(s): CKTOTAL, CKMB, CKMBINDEX, TROPONINI in the last 168 hours. BNP: BNP (last 3 results) No results for input(s): BNP in the last 8760 hours.  ProBNP (last 3 results) No results for input(s): PROBNP in the last 8760 hours.  CBG: No results for input(s): GLUCAP in the last 168 hours.     Signed:  Kayleen Memos, MD [facility] 05/15/2018, 5:36 PM

## 2018-05-15 NOTE — Care Management Note (Signed)
Case Management Note  Patient Details  Name: DEMERIUS PODOLAK MRN: [id number] Date of Birth: 1961/04/16  Subjective/Objective:                  discharge  Action/Plan: Advanced hhc for RN needed in the home Patient is aware.  Expected Discharge Date:  05/15/18               Expected Discharge Plan:  [place]  In-House Referral:     Discharge planning Services  CM Consult  Post Acute Care Choice:  Home Health Choice offered to:  Patient  DME Arranged:    DME Agency:     HH Arranged:  RN [place] Agency:  [place]  Status of Service:  Completed, signed off  If discussed at [place] of Stay Meetings, dates discussed:    Additional Comments:  Leeroy Cha, RN 05/15/2018, 12:02 PM

## 2018-05-15 NOTE — Progress Notes (Addendum)
Patient ID: Jorge Peterson, male   DOB: [birth year], 59 y.o.   MRN: [id number]     Progress Note   Subjective   Day # 5 Hospital stay  Feels great - no pain at all, tolerating soft diet, having BMs Afebrile  Switched to oral abx yesterday , after 3 days IV Zosyn   Objective   Vital signs in last 24 hours: Temp:  [97.6 F (36.4 C)-98.7 F (37.1 C)] 97.6 F (36.4 C) (09/16 0510) Pulse Rate:  [41-44] 41 (09/16 0510) Resp:  [16-19] 16 (09/16 0510) BP: (115-141)/(60-75) 141/75 (09/16 0510) SpO2:  [96 %-98 %] 98 % (09/16 0510) Last BM Date: 05/14/18 General:   WM in NAD, pleasant Heart:  Regular rate and rhythm; no murmurs Lungs: Respirations even and unlabored, lungs CTA bilaterally Abdomen:  Soft, nontender and nondistended. Normal bowel sounds. Extremities:  Without edema. Neurologic:  Alert and oriented,  grossly normal neurologically. Psych:  Cooperative. Normal mood and affect.  Intake/Output from previous day: 09/15 0701 - 09/16 0700 In: 2268.5 [P.O.:720; I.V.:1548.5] Out: -  Intake/Output this shift: No intake/output data recorded.  Lab Results: Recent Labs    05/13/18 0516  WBC 6.6  HGB 13.0  HCT 39.4  PLT 248   BMET Recent Labs    05/13/18 0516 05/14/18 1233  NA 145 145  K 4.0 3.7  CL 107 109  CO2 28 28  GLUCOSE 98 123*  BUN 11 9  CREATININE 1.27* 1.20  CALCIUM 9.3 9.0        Assessment / Plan:     #1  57 year old white male with history of recurrent diverticulitis, with multiple prior episodes currently admitted with persistent sigmoid diverticulitis and associated 2.7 x 3.2 cm abscess in the pericolonic soft tissues along the sigmoid mesocolon. CT was done for persistent symptoms after a 12-day course of Augmentin.  He has done well during hospitalization has no persistent discomfort, has been afebrile and with normal WBC.  He was initially treated with IV Zosyn and converted to oral Cipro and Flagyl yesterday  Plan ; Repeat CT is  ordered for this morning  Timing of discharge and length of course of Cipro and Flagyl will be determined based on CT findings  Plan is for patient to follow-up with Dr. Hermelinda Dellen [place] surgery after discharge. He is now agreeable to surgery, and hopefully sigmoid colectomy can be done as he recovers from this episode, and prior to recurrence.  We will  follow-up later today regarding CT findings.      Contact  Amy [place], P.A.-C               [phone]      Active Problems:   Colonic diverticular abscess     LOS: 4 days   Amy Esterwood  05/15/2018, 10:11 AM     [facility] Attending   I have taken an interval history, reviewed the chart and examined the patient. I agree with the Advanced Practitioner's note, impression and recommendations.     Abscess nearly gone on CT  Gatha Mayer, MD, [facility] Gastroenterology 05/15/2018 5:42 PM

## 2018-05-15 NOTE — Progress Notes (Signed)
[facility] Progress Note     Subjective: CC:  Denies pain or nausea. Tolerating PO and having bowel function. Again reports multiple hospitalizations for diverticulitis.   Objective: Vital signs in last 24 hours: Temp:  [97.6 F (36.4 C)-98.7 F (37.1 C)] 97.6 F (36.4 C) (09/16 0510) Pulse Rate:  [41-44] 41 (09/16 0510) Resp:  [16-19] 16 (09/16 0510) BP: (115-141)/(60-75) 141/75 (09/16 0510) SpO2:  [96 %-98 %] 98 % (09/16 0510) Last BM Date: 05/14/18  Intake/Output from previous day: 09/15 0701 - 09/16 0700 In: 2268.5 [P.O.:720; I.V.:1548.5] Out: -  Intake/Output this shift: No intake/output data recorded.  PE: Gen:  Alert, NAD, pleasant Card:  Regular rate and rhythm, pedal pulses 2+ BL Pulm:  Normal effort, clear to auscultation bilaterally Abd: Soft, non-tender, non-distended, bowel sounds present in all 4 quadrants, no HSM Skin: warm and dry, no rashes  Psych: A&Ox3   Lab Results:  Recent Labs    05/13/18 0516  WBC 6.6  HGB 13.0  HCT 39.4  PLT 248   BMET Recent Labs    05/13/18 0516 05/14/18 1233  NA 145 145  K 4.0 3.7  CL 107 109  CO2 28 28  GLUCOSE 98 123*  BUN 11 9  CREATININE 1.27* 1.20  CALCIUM 9.3 9.0   PT/INR No results for input(s): LABPROT, INR in the last 72 hours. CMP     Component Value Date/Time   NA 145 05/14/2018 1233   NA 140 04/30/2014 1124   K 3.7 05/14/2018 1233   K 3.8 04/30/2014 1124   CL 109 05/14/2018 1233   CL 106 04/30/2014 1124   CO2 28 05/14/2018 1233   CO2 28 04/30/2014 1124   GLUCOSE 123 (H) 05/14/2018 1233   GLUCOSE 122 (H) 04/30/2014 1124   BUN 9 05/14/2018 1233   BUN 28 (H) 04/30/2014 1124   CREATININE 1.20 05/14/2018 1233   CREATININE 1.38 (H) 04/30/2014 1124   CALCIUM 9.0 05/14/2018 1233   CALCIUM 9.1 04/30/2014 1124   PROT 6.5 05/12/2018 0508   PROT 6.9 04/30/2014 1124   ALBUMIN 3.2 (L) 05/12/2018 0508   ALBUMIN 4.0 04/30/2014 1124   AST 19 05/12/2018 0508   AST 34 04/30/2014 1124    ALT 20 05/12/2018 0508   ALT 53 04/30/2014 1124   ALKPHOS 67 05/12/2018 0508   ALKPHOS 87 04/30/2014 1124   BILITOT 0.9 05/12/2018 0508   BILITOT 0.5 04/30/2014 1124   GFRNONAA >60 05/14/2018 1233   GFRNONAA 58 (L) 04/30/2014 1124   GFRAA >60 05/14/2018 1233   GFRAA >60 04/30/2014 1124   Lipase     Component Value Date/Time   LIPASE 30 05/24/2014 1155   LIPASE 229 04/30/2014 1124       Studies/Results: No results found.  Anti-infectives: Anti-infectives (From admission, onward)   Start     Dose/Rate Route Frequency Ordered Stop   05/15/18 0000  ciprofloxacin (CIPRO) 500 MG tablet     500 mg Oral 2 times daily 05/15/18 1137 05/21/18 2359   05/15/18 0000  metroNIDAZOLE (FLAGYL) 500 MG tablet     500 mg Oral Every 8 hours 05/15/18 1137 05/20/18 2359   05/14/18 1400  metroNIDAZOLE (FLAGYL) tablet 500 mg     500 mg Oral Every 8 hours 05/14/18 0948     05/14/18 1000  ciprofloxacin (CIPRO) tablet 500 mg     500 mg Oral 2 times daily 05/14/18 0948     05/11/18 1200  piperacillin-tazobactam (ZOSYN) IVPB 3.375 g  Status:  Discontinued     3.375 g 12.5 mL/hr over 240 Minutes Intravenous Every 8 hours 05/11/18 1156 05/14/18 0948       Assessment/Plan 1.  Diverticulitis with pericolonic abscess (last CT scan 05/09/2018)             Seeing Dr. Hilarie Fredrickson             leukocytosis resolved, on PO abx             Clinically better - CT scan on today to check abscess.   Stable for discharge - Follow up with Dr. Marcello Moores in the office.  2.  History of A fib 3.  DM 4.  DVT prophylaxis - Lovenox  LOS: 4 days    Obie Dredge, [facility] Surgery Pager: [phone]

## 2018-05-16 ENCOUNTER — Telehealth: Payer: Self-pay | Admitting: Internal Medicine

## 2018-05-16 ENCOUNTER — Ambulatory Visit: Payer: 59 | Admitting: Physician Assistant

## 2018-05-16 VITALS — BP 138/84 | HR 49 | Ht 67.0 in | Wt 218.0 lb

## 2018-05-16 DIAGNOSIS — R001 Bradycardia, unspecified: Secondary | ICD-10-CM

## 2018-05-16 DIAGNOSIS — I480 Paroxysmal atrial fibrillation: Secondary | ICD-10-CM

## 2018-05-16 DIAGNOSIS — I10 Essential (primary) hypertension: Secondary | ICD-10-CM | POA: Diagnosis not present

## 2018-05-16 NOTE — Patient Instructions (Addendum)
Medication Instructions:   Your physician recommends that you continue on your current medications as directed. Please refer to the Current Medication list given to you today.   If you need a refill on your cardiac medications before your next appointment, please call your pharmacy.  Labwork: NONE ORDERED  TODAY    Testing/Procedures: NONE ORDERED  TODAY    Follow-Up:  IN 2 MONTHS WITH URSUY  OR ALLRED    Any Other Special Instructions Will Be Listed Below (If Applicable).

## 2018-05-16 NOTE — Telephone Encounter (Signed)
Pt was at [facility] on 05/11/18. He has a f/u appt with Dr. Hilarie Fredrickson on 06/28/18 but wants to be seen sooner. I could not find anything sooner not even with a PA.

## 2018-05-16 NOTE — Telephone Encounter (Signed)
Dr. Hilarie Fredrickson how soon does Mr. Levene need to be seen for follow-up?

## 2018-05-16 NOTE — Progress Notes (Signed)
Cardiology Office Note Date:  05/16/2018  Patient ID:  Jorge Peterson, DOB 11/22/1960, MRN [id number] PCP:  [name], MD  Electrophysiologist: Dr. Rayann Heman    Chief Complaint: post-hospital f/u  History of Present Illness: Jorge Peterson is a 57 y.o. male with history of HTN, HLD, DM, GERD, AFib h/o PVI ablation 2012, w/ILR in place, asymptomatic bradycardia.  He comes in today to be seen for Dr. Rayann Heman.  Last seen by him in April 2019.  At that visit noted his AF burden had not changed, at 0.8%, discussed his CHA2DS2Vasc had increased to 2, though the pt elected not to start a/c.  Planned for 12mo APP visit.  Most recently hospitalized with diverticulitis/with pericolonic abscess, discharged yesterday.  During his stay, observed to have bradycardia that he was asymptomatic from, record notes d/w Dr. Rayann Heman, no change in his medicines.  He comes accompanied by his wife.  He is feeling so much better then he had been in the last 3 weeks with his diverticulitis/erforation/abcess. Cardiac-wise he has not had any symptoms or concerns.  He reports in the hospital they would wake him to see if he was feeling ok, reportedly because his HR was 38-39bpm.  He denies any symptoms or bradycardia.  No dizzy spells, weakness, no near syncope or syncope.  Outside of his recent GI issue, he has been going to the gym regularly, no exertional intolerances, reports his HR by his FitBit gets to 150 at peak exercise.  He feels very good while exercising, no CP.  He has not had any sensation of palpitations.  Prior to his ablation he could tell when he was having AFib.  He has not felt any since, despite knowing that he does occassionally when his ILR is interrogated.   Device information: MDT ILR, implanted 06/27/15   Past Medical History:  Diagnosis Date  . Atrial fibrillation ([place])    s/p afib ablation 7/12  . Bradycardia    asymptomatic  . Diabetes ([place])   . Diverticulitis   . Dysrhythmia   .  Elevated LFTs   . Fall   . Family history of anesthesia complication    father gets pneumonia after "  . GERD (gastroesophageal reflux disease)   . Hyperlipidemia   . Hyperplastic colon polyp   . Hypertension   . Internal hemorrhoids   . Tinnitus   . Tubular adenoma of colon   . Wears glasses     Past Surgical History:  Procedure Laterality Date  . ATRIAL ABLATION SURGERY  03/24/11   PVI by Greggory Brandy  . BACK SURGERY    . BRONCHOSCOPY  01/23/2013    biopsy  . CHOLECYSTECTOMY  05/02/2014   LAP CHOLE  . CHOLECYSTECTOMY N/A 05/02/2014   Procedure: LAPAROSCOPIC CHOLECYSTECTOMY WITH INTRAOPERATIVE CHOLANGIOGRAM;  Surgeon: Erroll Luna, MD;  Location: [facility];  Service: General;  Laterality: N/A;  . EP IMPLANTABLE DEVICE N/A 06/27/2015   Procedure: Loop Recorder Insertion;  Surgeon: [name], MD;  Location: [facility];  Service: Cardiovascular;  Laterality: N/A;  . ERCP N/A 05/25/2014   Procedure: ENDOSCOPIC RETROGRADE CHOLANGIOPANCREATOGRAPHY (ERCP);  Surgeon: [name], MD;  Location: [facility];  Service: Endoscopy;  Laterality: N/A;  . Fistula surgery    . MEDIASTINOSCOPY N/A 01/23/2013   Procedure: MEDIASTINOSCOPY;  Surgeon: Ivin Poot, MD;  Location: [facility];  Service: Thoracic;  Laterality: N/A;  . QUADRICEPS TENDON REPAIR Right 01/13/2016   Procedure: REPAIR QUADRICEP TENDON;  Surgeon: Latanya Maudlin, MD;  Location: [facility];  Service: Orthopedics;  Laterality: Right;  Received femoral nerve block in holding  . SHOULDER SURGERY     x 2  . TONSILLECTOMY    . VIDEO BRONCHOSCOPY WITH ENDOBRONCHIAL ULTRASOUND N/A 01/23/2013   Procedure: VIDEO BRONCHOSCOPY WITH ENDOBRONCHIAL ULTRASOUND;  Surgeon: Ivin Poot, MD;  Location: [facility];  Service: Thoracic;  Laterality: N/A;    Current Outpatient Medications  Medication Sig Dispense Refill  . Biotin 1000 MCG tablet Take 1,000 mcg by mouth daily.    . ciprofloxacin (CIPRO) 500 MG tablet Take 1 tablet (500 mg total) by mouth  2 (two) times daily for 5 days. 10 tablet 0  . Coenzyme Q10 (COQ10) 150 MG CAPS Take 150 mg by mouth every evening.     . fish oil-omega-3 fatty acids 1000 MG capsule Take 1-2 g by mouth See admin instructions. Take two capsules in the morning and one capsule in the evening.    . flecainide (TAMBOCOR) 50 MG tablet TAKE 1 TABLET BY MOUTH TWICE A DAY (Patient taking differently: Take 50 mg by mouth 2 (two) times daily. ) 180 tablet 3  . fluticasone (FLONASE) 50 MCG/ACT nasal spray Place 1 spray into the nose daily as needed for allergies.     . folic acid (FOLVITE) 063 MCG tablet Take 400 mcg by mouth daily.      . metFORMIN (GLUCOPHAGE-XR) 500 MG 24 hr tablet Take 500 mg by mouth daily with supper.     . metoprolol succinate (TOPROL-XL) 25 MG 24 hr tablet Take 0.5 tablets (12.5 mg total) by mouth daily. (Patient taking differently: Take 12.5 mg by mouth at bedtime. ) 45 tablet 3  . metroNIDAZOLE (FLAGYL) 500 MG tablet Take 1 tablet (500 mg total) by mouth 3 (three) times daily for 5 days. 15 tablet 0  . Multiple Vitamin (MULTIVITAMIN) tablet Take 1 tablet by mouth daily.     [name] saccharomyces boulardii (FLORASTOR) 250 MG capsule Take 250 mg by mouth 2 (two) times daily.    . simvastatin (ZOCOR) 20 MG tablet TAKE 1 TABLET BY MOUTH EVERYDAY AT BEDTIME (Patient taking differently: Take 20 mg by mouth at bedtime. ) 90 tablet 3   No current facility-administered medications for this visit.     Allergies:   Codeine; Latex; and Oxycodone-acetaminophen   Social History:  The patient  reports that he has never smoked. He has never used smokeless tobacco. He reports that he does not drink alcohol or use drugs.   Family History:  The patient's family history includes Diabetes in his father; Heart disease in his father; Liver disease in his mother.  ROS:  Please see the history of present illness.  All other systems are reviewed and otherwise negative.   PHYSICAL EXAM: VS:  BP 138/84   Pulse (!) 49   Ht  5\' 7"  (1.702 m)   Wt 218 lb (98.9 kg)   BMI 34.14 kg/m  BMI: Body mass index is 34.14 kg/m. Well nourished, well developed, in no acute distress  HEENT: normocephalic, atraumatic  Neck: no JVD, carotid bruits or masses Cardiac:  RRR; bradycardic, no significant murmurs, no rubs, or gallops Lungs:  CTA b/l, no wheezing, rhonchi or rales  Abd: not examined MS: no deformity or atrophy Ext: no edema  Skin: warm and dry, no rash Neuro:  No gross deficits appreciated Psych: euthymic mood, full affect  ILR site is stable, no tethering or discomfort   EKG:  Done today and reviewed by myself:  SB, 1st degree AVblock, PR 213ms, QRS 34ms, QTc 4102ms ILR interrogation done today and reviewed by myself: battery is good, SB today, brady detection at 30bpm, no events noted, AF burden is 1.5%, one long enough to record, 12 minutes  07/02/15: TTE Study Conclusions - Left ventricle: The cavity size was normal. Wall thickness was   normal. Systolic function was normal. The estimated ejection   fraction was in the range of 55% to 60%. Wall motion was normal;   there were no regional wall motion abnormalities. Doppler   parameters are consistent with abnormal left ventricular   relaxation (grade 1 diastolic dysfunction). Impressions: - Normal LV systolic function; grade 1 diastolic dysfunction;   trace TR.  Recent Labs: 05/12/2018: ALT 20 05/13/2018: Hemoglobin 13.0; Platelets 248 05/14/2018: BUN 9; Creatinine, Ser 1.20; Magnesium 2.1; Potassium 3.7; Sodium 145  No results found for requested labs within last 8760 hours.   Estimated Creatinine Clearance: 75.2 mL/min (by C-G formula based on SCr of 1.2 mg/dL).   Wt Readings from Last 3 Encounters:  05/16/18 218 lb (98.9 kg)  05/11/18 215 lb (97.5 kg)  11/28/17 235 lb (106.6 kg)     Other studies reviewed: Additional studies/records reviewed today include: summarized above  ASSESSMENT AND PLAN:  1. Paroxysmal AFib     CHA2DS2Vasc is 2, pt  had declined a/c     Flecainide/metoprolol     ILR in place     Burden has increased, one strip available is rate controlled      We revisited his CHA2DS2Vasc score, AFib/mechanism of stroke, anticoagulation.  He was on xarelto historically without issues.  He does not recall any problems with it.    He is s/p acute diverticulitis/perforation and reports that he will be planned for colon resection in the next 4-6 weeks.  Now, not likely the right time to start full a/c.  He will give it more thought/consideration with his wife.  2. HTN     looks OK, no changes  3. Bradycardia     Asymptomatic  The patient's wife expresses concern about him having surgery with his HR, recalls when they pulled his sheaths after his ablation his heart "stopped".  Likely a vagally mediated event.  I will reach out to Dr. Rayann Heman for any recommendations/thoughts on this.       Disposition: F/u with [place] in 2 months, he should be post colon surgery by then, revisit his AF, anticoagulation, sooner if needed.  Current medicines are reviewed at length with the patient today.  The patient did not have any concerns regarding medicines.  Venetia Night, PA-C 05/16/2018 4:54 PM     [address] [place] [address] [phone] (office)  [phone] (fax)

## 2018-05-17 ENCOUNTER — Telehealth: Payer: Self-pay

## 2018-05-17 NOTE — Addendum Note (Signed)
Addended by: Claude Manges on: 05/17/2018 04:23 PM   Modules accepted: Orders

## 2018-05-17 NOTE — Telephone Encounter (Signed)
Notes sent to Dr. Hilarie Fredrickson and have spoken with pt. Awaiting reply.

## 2018-05-17 NOTE — Telephone Encounter (Signed)
Pt states he went home from the hospital Monday and is taking Cipro and Flagyl exactly like he is supposed to and he is following a soft diet. Pt reports that today he has noticed a dull discomfort mainly when pressing in that area where the abscess was located. States this has not been there it just started this morning. States his stools are quite loose also. Pt does not want to wind up back in the shape he was in prior to IV antx. Please advise.

## 2018-05-17 NOTE — Telephone Encounter (Signed)
Spoke to patient by phone He remains on Cipro and Flagyl He is certainly feeling better and is having very minimal left lower quadrant discomfort Appetite is good, energy level is good.  No fever. He is eating a soft diet Bowel movements have been mostly loose but not exactly diarrhea.  Nonbloody.  Non-melenic. He has enough antibiotics through Saturday  Questions about diet, exercise and length of antibiotic course  I think we should give him additional 5 days of ciprofloxacin and metronidazole given the mild discomfort he is having in the left lower quadrant and the persistent sigmoid diverticulitis seen by CT scan 2 days ago.  Diverticular abscess has improved if not completely resolved but still diverticulitis in the sigmoid He should continue probiotic Continue soft diet/low residue diet until pain completely gone Avoid exercise other than walking for 1 week He has follow-up with Dr. Marcello Moores to discuss segmental sigmoid resection  I would like to see him back in the office; no appointments available so I will need to squeeze him in Please see if October 2 at 1 PM would be possible

## 2018-05-18 ENCOUNTER — Other Ambulatory Visit: Payer: Self-pay

## 2018-05-18 DIAGNOSIS — E785 Hyperlipidemia, unspecified: Secondary | ICD-10-CM | POA: Diagnosis not present

## 2018-05-18 DIAGNOSIS — E119 Type 2 diabetes mellitus without complications: Secondary | ICD-10-CM | POA: Diagnosis not present

## 2018-05-18 MED ORDER — CIPROFLOXACIN HCL 500 MG PO TABS: 500.0000 mg | ORAL_TABLET | Freq: Two times a day (BID) | ORAL | 0 refills | Status: AC

## 2018-05-18 MED ORDER — METRONIDAZOLE 500 MG PO TABS: 500.0000 mg | ORAL_TABLET | Freq: Three times a day (TID) | ORAL | 0 refills | Status: AC

## 2018-05-18 NOTE — Telephone Encounter (Signed)
5 more days worth of cipro and flagyl sent to pharmacy for pt. Pt scheduled to see Dr. Hilarie Fredrickson 05/31/18@2 :15pm. Pt aware.

## 2018-05-23 DIAGNOSIS — E785 Hyperlipidemia, unspecified: Secondary | ICD-10-CM | POA: Diagnosis not present

## 2018-05-23 DIAGNOSIS — K5790 Diverticulosis of intestine, part unspecified, without perforation or abscess without bleeding: Secondary | ICD-10-CM | POA: Diagnosis not present

## 2018-05-23 DIAGNOSIS — E119 Type 2 diabetes mellitus without complications: Secondary | ICD-10-CM | POA: Diagnosis not present

## 2018-05-24 ENCOUNTER — Encounter: Payer: Self-pay | Admitting: *Deleted

## 2018-05-26 LAB — CUP PACEART REMOTE DEVICE CHECK
Date Time Interrogation Session: 20190902054257
Implantable Pulse Generator Implant Date: 20161028

## 2018-05-29 ENCOUNTER — Telehealth: Payer: Self-pay | Admitting: *Deleted

## 2018-05-29 ENCOUNTER — Ambulatory Visit: Payer: Self-pay | Admitting: General Surgery

## 2018-05-29 DIAGNOSIS — K5730 Diverticulosis of large intestine without perforation or abscess without bleeding: Secondary | ICD-10-CM | POA: Diagnosis not present

## 2018-05-29 NOTE — Telephone Encounter (Signed)
LMOVM requesting call back to the [facility]. Direct number given.  Will ask patient if symptomatic with pause episode noted on LINQ from 05/18/18 at 0405, duration 6sec (transmitted on 05/28/18).

## 2018-05-29 NOTE — H&P (Signed)
History of Present Illness Jorge Ruff MD; 6/96/2952 2:55 PM) The patient is a 57 year old male who presents with diverticulitis. 57 year old male who presents to the office for hospital follow-up after an episode of focally perforated diverticulitis. He was discharged proximally 3 weeks ago. Since that time he has had minimal pain and is tolerating a diet without difficulty. He does complain of some fatigue. His last colonoscopy was approximately 2 years ago. Past medical history significant for paroxysmal atrial fibrillation and diabetes. He does see a cardiologist regularly and is not currently anticoagulated as he is in normal sinus rhythm. Past surgical history significant for lap Cholecystectomy. In the past he has had multiple episodes of diverticulitis which would've been treated with antibiotics only. He has had 3 episodes of diverticulitis this year.   Problem List/Past Medical Jorge Ruff, MD; 8/41/3244 2:56 PM) S/P LAPAROSCOPIC CHOLECYSTECTOMY (Z90.49) DIVERTICULAR DISEASE OF COLON (K57.30)  Past Surgical History Jorge Ruff, MD; 0/05/2724 2:56 PM) Anal Fissure Repair Colon Polyp Removal - Colonoscopy Gallbladder Surgery - Laparoscopic Spinal Surgery - Lower Back  Diagnostic Studies History Jorge Ruff, MD; 3/66/4403 2:56 PM) Colonoscopy 1-5 years ago  Allergies (Tanisha A. Owens Shark, [place]; 05/29/2018 2:25 PM) Oxycodone-Acetaminophen *ANALGESICS - OPIOID* Allergies Reconciled  Medication History Jorge Ruff, MD; 4/74/2595 2:56 PM) metFORMIN HCl ER (500MG  Tablet ER 24HR, Oral) Active. Flonase (50MCG/ACT Suspension, Nasal) Active. Folic Acid (638VFI Tablet, Oral) Active. Hydrochlorothiazide (12.5MG  Capsule, Oral) Active. Multiple Vitamin (Oral) Active. PriLOSEC (40MG  Capsule DR, Oral) Active. Probiotic (Oral) Active. Gas-X Prevention (Oral) Active. Zocor (40MG  Tablet, Oral) Active. Simvastatin (20MG  Tablet, Oral) Active. Flecainide  Acetate (50MG  Tablet, Oral) Active. Medications Reconciled Neomycin Sulfate (500MG  Tablet, 2 (two) Oral SEE NOTE, Taken starting 05/29/2018) Active. (TAKE TWO TABLETS AT 2 PM, 3 PM, AND 10 PM THE DAY PRIOR TO SURGERY) Flagyl (500MG  Tablet, 2 (two) Oral SEE NOTE, Taken starting 05/29/2018) Active. (Take at 2pm, 3pm, and 10pm the day prior to your colon operation)  Social History Jorge Ruff, MD; 4/33/2951 2:56 PM) Caffeine use Carbonated beverages, Coffee, Tea. No alcohol use Tobacco use Never smoker.  Family History Jorge Ruff, MD; 8/84/1660 2:56 PM) Diabetes Mellitus Father, Sister. Heart Disease Father. Hypertension Father, Sister. Respiratory Condition Father.  Other Problems Jorge Ruff, MD; 02/27/1600 2:56 PM) Atrial Fibrillation Cholelithiasis Diverticulosis High blood pressure Hypercholesterolemia     Review of Systems Jorge Ruff MD; 0/93/2355 2:56 PM) General Present- Appetite Loss, Fatigue and Weight Loss. Not Present- Chills, Fever, Night Sweats and Weight Gain. Skin Not Present- Change in Wart/Mole, Dryness, Hives, Jaundice, New Lesions, Non-Healing Wounds, Rash and Ulcer. HEENT Not Present- Earache, Hearing Loss, Hoarseness, Nose Bleed, Oral Ulcers, Ringing in the Ears, Seasonal Allergies, Sinus Pain, Sore Throat, Visual Disturbances, Wears glasses/contact lenses and Yellow Eyes. Respiratory Not Present- Bloody sputum, Chronic Cough, Difficulty Breathing, Snoring and Wheezing. Breast Not Present- Breast Mass, Breast Pain, Nipple Discharge and Skin Changes. Cardiovascular Not Present- Chest Pain, Difficulty Breathing Lying Down, Leg Cramps, Palpitations, Rapid Heart Rate, Shortness of Breath and Swelling of Extremities. Gastrointestinal Present- Bloating, Constipation and Excessive gas. Not Present- Abdominal Pain, Bloody Stool, Change in Bowel Habits, Chronic diarrhea, Difficulty Swallowing, Gets full quickly at meals, Hemorrhoids,  Indigestion, Nausea, Rectal Pain and Vomiting. Male Genitourinary Not Present- Blood in Urine, Change in Urinary Stream, Frequency, Impotence, Nocturia, Painful Urination, Urgency and Urine Leakage. Musculoskeletal Not Present- Back Pain, Joint Pain, Joint Stiffness, Muscle Pain, Muscle Weakness and Swelling of Extremities. Neurological Not Present- Decreased Memory, Fainting, Headaches, Numbness, Seizures, Tingling, Tremor,  Trouble walking and Weakness. Psychiatric Not Present- Anxiety, Bipolar, Change in Sleep Pattern, Depression, Fearful and Frequent crying. Endocrine Not Present- Cold Intolerance, Excessive Hunger, Hair Changes, Heat Intolerance and New Diabetes. Hematology Not Present- Easy Bruising, Excessive bleeding, Gland problems, HIV and Persistent Infections.  Vitals (Tanisha A. Brown RMA; 05/29/2018 2:25 PM) 05/29/2018 2:25 PM Weight: 216.4 lb Height: 67in Body Surface Area: 2.09 m Body Mass Index: 33.89 kg/m  Temp.: 98.39F  Pulse: 78 (Regular)  BP: 132/76 (Sitting, Left Arm, Standard)      Physical Exam Jorge Ruff MD; 9/48/5462 2:56 PM)  General Mental Status-Alert. General Appearance-Not in acute distress. Build & Nutrition-Well nourished. Posture-Normal posture. Gait-Normal.  Head and Neck Head-normocephalic, atraumatic with no lesions or palpable masses. Trachea-midline.  Chest and Lung Exam Chest and lung exam reveals -on auscultation, normal breath sounds, no adventitious sounds and normal vocal resonance.  Cardiovascular Cardiovascular examination reveals -normal heart sounds, regular rate and rhythm with no murmurs and no digital clubbing, cyanosis, edema, increased warmth or tenderness.  Abdomen Inspection Inspection of the abdomen reveals - No Hernias. Palpation/Percussion Palpation and Percussion of the abdomen reveal - Soft, Non Tender, No Rigidity (guarding), No hepatosplenomegaly and No Palpable abdominal  masses.  Neurologic Neurologic evaluation reveals -alert and oriented x 3 with no impairment of recent or remote memory, normal attention span and ability to concentrate, normal sensation and normal coordination.  Musculoskeletal Normal Exam - Bilateral-Upper Extremity Strength Normal and Lower Extremity Strength Normal.    Assessment & Plan Jorge Ruff MD; 03/01/5008 2:56 PM)  DIVERTICULAR DISEASE OF COLON (K57.30) Impression: 57 year old male who presents to the office for evaluation of diverticular disease. He was hospitalized in early September for diverticulitis with abscess which resolved with IV antibiotics. He will follow-up with Dr. Hilarie Fredrickson next week. We discussed that given his recurrent episodes of diverticulitis as well as his recent perforation he would be a good candidate for colectomy. We have discussed this in detail including risk and benefits. All questions were answered. I do think he would benefit from a flexible sigmoidoscopy to make sure that there is no tumor located within the sigmoid colon. We will also get cardiac clearance prior to the OR.  The surgery and anatomy were described to the patient as well as the risks of surgery and the possible complications. These include: Bleeding, deep abdominal infections and possible wound complications such as hernia and infection, damage to adjacent structures, leak of surgical connections, which can lead to other surgeries and possibly an ostomy, possible need for other procedures, such as abscess drains in radiology, possible prolonged hospital stay, possible diarrhea from removal of part of the colon, possible constipation from narcotics, possible bowel, bladder or sexual dysfunction if having rectal surgery, prolonged fatigue/weakness or appetite loss, possible early recurrence of of disease, possible complications of their medical problems such as heart disease or arrhythmias or lung problems, death (less than 1%). I believe  the patient understands and wishes to proceed with the surgery.

## 2018-05-29 NOTE — Telephone Encounter (Signed)
Spoke with patient. He reports he was asymptomatic during pause episode, was asleep at the time. Advised that Dr. Rayann Heman will review episode and we will call back if any recommendations. Patient is agreeable to this plan and appreciative of call.  ECG placed in Dr. Otilio Connors folder for review.

## 2018-05-29 NOTE — Telephone Encounter (Signed)
Pt was returning Ches[place] phone call. The best number to call him is the [phone]

## 2018-05-31 ENCOUNTER — Encounter: Payer: Self-pay | Admitting: Internal Medicine

## 2018-05-31 ENCOUNTER — Ambulatory Visit: Payer: 59 | Admitting: Internal Medicine

## 2018-05-31 VITALS — BP 130/86 | HR 60 | Ht 67.0 in | Wt 218.0 lb

## 2018-05-31 DIAGNOSIS — K219 Gastro-esophageal reflux disease without esophagitis: Secondary | ICD-10-CM | POA: Diagnosis not present

## 2018-05-31 DIAGNOSIS — Z8719 Personal history of other diseases of the digestive system: Secondary | ICD-10-CM

## 2018-05-31 MED ORDER — SUPREP BOWEL PREP KIT 17.5-3.13-1.6 GM/177ML PO SOLN: 1.0000 | ORAL | 0 refills | Status: DC

## 2018-05-31 NOTE — Patient Instructions (Signed)
You have been scheduled for a colonoscopy. Please follow written instructions given to you at your visit today.  Please pick up your prep supplies at the pharmacy within the next 1-3 days. If you use inhalers (even only as needed), please bring them with you on the day of your procedure. Your physician has requested that you go to www.startemmi.com and enter the access code given to you at your visit today. This web site gives a general overview about your procedure. However, you should still follow specific instructions given to you by our office regarding your preparation for the procedure.  If you are age 37 or older, your body mass index should be between 23-30. Your Body mass index is 34.14 kg/m. If this is out of the aforementioned range listed, please consider follow up with your Primary Care Provider.  If you are age 25 or younger, your body mass index should be between 19-25. Your Body mass index is 34.14 kg/m. If this is out of the aformentioned range listed, please consider follow up with your Primary Care Provider.

## 2018-05-31 NOTE — Progress Notes (Signed)
Patient ID: Jorge Peterson, male   DOB: 1961/01/25, 57 y.o.   MRN: [id number] HPI: Jorge Peterson is a 57 year old male with a history of recent complicated diverticulitis, history of adenomatous colon polyps, GERD, history of atrial fibrillation status post ablation, hypertension, hyperlipidemia and diabetes who is here for follow-up.  He is here alone today.  He was hospitalized with complicated diverticulitis with a small fluid collection adjacent to the sigmoid colon.  He was hospitalized after failing oral antibiotics at home.  He received IV antibiotics and was managed medically.  He then took an additional nearly 10 days of Augmentin.  He reports that he is feeling so much better.  He is been cautious with his diet.  His abdominal pain is gone.  He has lost about 20 pounds as he was on liquid diet for quite some time.  He has had good energy level and even returned to doing some light work in the gym.  He has been off of antibiotics but continuing probiotic.  No fevers or chills.  Heartburn is been well controlled.  Bowel habits have returned to normal for him.  No diarrhea or constipation.  He does take omeprazole 40 mg daily.  He is taking Florastor 250 mg twice daily.  He saw Leighton Ruff in clinic yesterday and they have plans for robotic assisted partial colectomy even his history of multiple episodes of diverticulitis and recent complicated diverticulitis.  The surgery is scheduled for 07/12/2018.  Dr. Marcello Moores contacted me and requested that we repeat direct visualization of the colon before surgery.  His last colonoscopy was performed on 05/04/2016.  This showed 2 polyps, largest 5 mm, and multiple small and large mouth diverticula from the distal transverse to sigmoid.  Hemorrhoids were also seen on that day.  The polyps which were removed were hyperplastic.  Past Medical History:  Diagnosis Date  . Atrial fibrillation ([place])    s/p afib ablation 7/12  . Bradycardia    asymptomatic  .  Colonic diverticular abscess   . Diabetes ([place])   . Diverticulitis   . Dysrhythmia   . Elevated LFTs   . Fall   . Family history of anesthesia complication    father gets pneumonia after "  . GERD (gastroesophageal reflux disease)   . Hyperlipidemia   . Hyperplastic colon polyp   . Hypertension   . Internal hemorrhoids   . Tinnitus   . Tubular adenoma of colon   . Wears glasses     Past Surgical History:  Procedure Laterality Date  . ATRIAL ABLATION SURGERY  03/24/11   PVI by Greggory Brandy  . BACK SURGERY    . BRONCHOSCOPY  01/23/2013    biopsy  . CHOLECYSTECTOMY  05/02/2014   LAP CHOLE  . CHOLECYSTECTOMY N/A 05/02/2014   Procedure: LAPAROSCOPIC CHOLECYSTECTOMY WITH INTRAOPERATIVE CHOLANGIOGRAM;  Surgeon: Erroll Luna, MD;  Location: [facility];  Service: General;  Laterality: N/A;  . EP IMPLANTABLE DEVICE N/A 06/27/2015   Procedure: Loop Recorder Insertion;  Surgeon: [name], MD;  Location: [facility];  Service: Cardiovascular;  Laterality: N/A;  . ERCP N/A 05/25/2014   Procedure: ENDOSCOPIC RETROGRADE CHOLANGIOPANCREATOGRAPHY (ERCP);  Surgeon: [name], MD;  Location: [facility];  Service: Endoscopy;  Laterality: N/A;  . Fistula surgery    . MEDIASTINOSCOPY N/A 01/23/2013   Procedure: MEDIASTINOSCOPY;  Surgeon: Ivin Poot, MD;  Location: [facility];  Service: Thoracic;  Laterality: N/A;  . QUADRICEPS TENDON REPAIR Right 01/13/2016   Procedure: REPAIR  QUADRICEP TENDON;  Surgeon: Latanya Maudlin, MD;  Location: [facility];  Service: Orthopedics;  Laterality: Right;  Received femoral nerve block in holding  . SHOULDER SURGERY     x 2  . TONSILLECTOMY    . VIDEO BRONCHOSCOPY WITH ENDOBRONCHIAL ULTRASOUND N/A 01/23/2013   Procedure: VIDEO BRONCHOSCOPY WITH ENDOBRONCHIAL ULTRASOUND;  Surgeon: Ivin Poot, MD;  Location: [facility];  Service: Thoracic;  Laterality: N/A;    Outpatient Medications Prior to Visit  Medication Sig Dispense Refill  . Biotin 1000 MCG tablet Take 1,000  mcg by mouth daily.    . Coenzyme Q10 (COQ10) 150 MG CAPS Take 150 mg by mouth every evening.     . fish oil-omega-3 fatty acids 1000 MG capsule Take 1-2 g by mouth See admin instructions. Take two capsules in the morning and one capsule in the evening.    . flecainide (TAMBOCOR) 50 MG tablet TAKE 1 TABLET BY MOUTH TWICE A DAY (Patient taking differently: Take 50 mg by mouth 2 (two) times daily. ) 180 tablet 3  . fluticasone (FLONASE) 50 MCG/ACT nasal spray Place 1 spray into the nose daily as needed for allergies.     . folic acid (FOLVITE) 762 MCG tablet Take 400 mcg by mouth daily.      . hydrochlorothiazide (HYDRODIURIL) 12.5 MG tablet Take 12.5 mg by mouth daily.  3  . metFORMIN (GLUCOPHAGE-XR) 500 MG 24 hr tablet Take 500 mg by mouth daily with supper.     . metoprolol succinate (TOPROL-XL) 25 MG 24 hr tablet Take 0.5 tablets (12.5 mg total) by mouth daily. (Patient taking differently: Take 12.5 mg by mouth at bedtime. ) 45 tablet 3  . Multiple Vitamin (MULTIVITAMIN) tablet Take 1 tablet by mouth daily.     [name] omeprazole (PRILOSEC) 40 MG capsule Take 1 capsule by mouth daily.  2  . saccharomyces boulardii (FLORASTOR) 250 MG capsule Take 250 mg by mouth 2 (two) times daily.    . simvastatin (ZOCOR) 20 MG tablet TAKE 1 TABLET BY MOUTH EVERYDAY AT BEDTIME (Patient taking differently: Take 20 mg by mouth at bedtime. ) 90 tablet 3   No facility-administered medications prior to visit.     Allergies  Allergen Reactions  . Codeine Itching and Rash  . Latex Itching and Rash  . Oxycodone-Acetaminophen Itching and Rash    Family History  Problem Relation Age of Onset  . Liver disease Mother        fatty liver  . Diabetes Father   . Heart disease Father        CABG at 55    Social History   Tobacco Use  . Smoking status: Never Smoker  . Smokeless tobacco: Never Used  Substance Use Topics  . Alcohol use: No  . Drug use: No    ROS: As per history of present illness, otherwise  negative  BP 130/86   Pulse 60   Ht 5\' 7"  (1.702 m)   Wt 218 lb (98.9 kg)   BMI 34.14 kg/m  Constitutional: Well-developed and well-nourished. No distress. HEENT: Normocephalic and atraumatic. Conjunctivae are normal.  No scleral icterus. Neck: Neck supple. Trachea midline. Cardiovascular: Normal rate, regular rhythm and intact distal pulses.  Pulmonary/chest: Effort normal and breath sounds normal. No wheezing, rales or rhonchi. Abdominal: Soft, nontender, nondistended. Bowel sounds active throughout. There are no masses palpable. No hepatosplenomegaly. Extremities: no clubbing, cyanosis, or edema Neurological: Alert and oriented to person place and time. Skin: Skin is warm and dry.  Psychiatric: Normal mood and affect. Behavior is normal.  RELEVANT LABS AND IMAGING: CBC    Component Value Date/Time   WBC 6.6 05/13/2018 0516   RBC 4.74 05/13/2018 0516   HGB 13.0 05/13/2018 0516   HGB 13.9 04/30/2014 1124   HCT 39.4 05/13/2018 0516   HCT 42.6 04/30/2014 1124   PLT 248 05/13/2018 0516   PLT 187 04/30/2014 1124   MCV 83.1 05/13/2018 0516   MCV 85 04/30/2014 1124   MCH 27.4 05/13/2018 0516   MCHC 33.0 05/13/2018 0516   RDW 12.0 05/13/2018 0516   RDW 13.0 04/30/2014 1124   LYMPHSABS 2.6 05/11/2018 1137   MONOABS 0.5 05/11/2018 1137   EOSABS 0.1 05/11/2018 1137   BASOSABS 0.0 05/11/2018 1137    CMP     Component Value Date/Time   NA 145 05/14/2018 1233   NA 140 04/30/2014 1124   K 3.7 05/14/2018 1233   K 3.8 04/30/2014 1124   CL 109 05/14/2018 1233   CL 106 04/30/2014 1124   CO2 28 05/14/2018 1233   CO2 28 04/30/2014 1124   GLUCOSE 123 (H) 05/14/2018 1233   GLUCOSE 122 (H) 04/30/2014 1124   BUN 9 05/14/2018 1233   BUN 28 (H) 04/30/2014 1124   CREATININE 1.20 05/14/2018 1233   CREATININE 1.38 (H) 04/30/2014 1124   CALCIUM 9.0 05/14/2018 1233   CALCIUM 9.1 04/30/2014 1124   PROT 6.5 05/12/2018 0508   PROT 6.9 04/30/2014 1124   ALBUMIN 3.2 (L) 05/12/2018 0508    ALBUMIN 4.0 04/30/2014 1124   AST 19 05/12/2018 0508   AST 34 04/30/2014 1124   ALT 20 05/12/2018 0508   ALT 53 04/30/2014 1124   ALKPHOS 67 05/12/2018 0508   ALKPHOS 87 04/30/2014 1124   BILITOT 0.9 05/12/2018 0508   BILITOT 0.5 04/30/2014 1124   GFRNONAA >60 05/14/2018 1233   GFRNONAA 58 (L) 04/30/2014 1124   GFRAA >60 05/14/2018 1233   GFRAA >60 04/30/2014 1124    ASSESSMENT/PLAN: 57 year old male with a history of recent complicated diverticulitis, history of adenomatous colon polyps, GERD, history of atrial fibrillation status post ablation, hypertension, hyperlipidemia and diabetes who is here for follow-up.  1.  Recent complicated diverticulitis --I am in complete agreement with plan for partial left colon resection given recurrent diverticulitis and most recently complicated diverticulitis.  I also feel it reasonable to repeat colonoscopy, also requested by Dr. Marcello Moores, prior to surgery.  We will plan this 1 day before his surgery therefore he can only perform one bowel preparation.  We will plan for colonoscopy on 07/11/2018 at 1:30 PM.  We discussed the risk, benefits and alternatives and he is agreeable and wishes to proceed  Symptoms of diverticulitis have resolved and his abdomen is benign.  He will remain off antibiotics but on probiotic until surgery.  He can liberalize his diet but I have recommended that he avoid popcorn (explained there is no science for dietary modification and that this is anecdotal advice)  2.  GERD --well-controlled on omeprazole 40 mg daily.  Continue current dose  25 minutes spent with the patient today. Greater than 50% was spent in counseling and coordination of care with the patient  TK:PTWSFKC, [place], [place] [facility] [place], [place] [address]

## 2018-06-05 ENCOUNTER — Ambulatory Visit (INDEPENDENT_AMBULATORY_CARE_PROVIDER_SITE_OTHER): Payer: 59 | Admitting: *Deleted

## 2018-06-05 DIAGNOSIS — I480 Paroxysmal atrial fibrillation: Secondary | ICD-10-CM | POA: Diagnosis not present

## 2018-06-05 NOTE — Progress Notes (Signed)
Carelink Summary Report / Loop Recorder 

## 2018-06-07 ENCOUNTER — Telehealth: Payer: Self-pay | Admitting: Internal Medicine

## 2018-06-07 NOTE — Telephone Encounter (Signed)
     [facility] Pre-operative Risk Assessment    Request for surgical clearance:  1. What type of surgery is being performed? Robotic partial colectomy  2. When is this surgery scheduled?07/12/18  3. What type of clearance is required (medical clearance vs. Pharmacy clearance to hold med vs. Both)? Medical clearance  Are there any medications that need to be held prior to surgery and how long? no  4. Practice name and name of physician performing surgery? [facility]; Dr. Marcello Moores  5. What is your office phone number [phone]   7.   What is your office fax number [phone]  8.   Anesthesia type (None, local, MAC, general)? General   Jorge Peterson 06/07/2018, 4:40 PM  _________________________________________________________________   (provider comments below)

## 2018-06-09 ENCOUNTER — Other Ambulatory Visit: Payer: Self-pay

## 2018-06-09 MED ORDER — AMOXICILLIN-POT CLAVULANATE 875-125 MG PO TABS: 1.0000 | ORAL_TABLET | Freq: Two times a day (BID) | ORAL | 0 refills | Status: DC

## 2018-06-09 NOTE — Telephone Encounter (Signed)
Dr. Rayann Heman pt needs Robotic partial colectomy 07/12/18  Pt saw Renee 05/16/18 and pts wife worried about the slow HR in the past,   Any recommendations for surgery?

## 2018-06-09 NOTE — Telephone Encounter (Signed)
Given recent history, I would restart Augmentin 875 mg twice daily x7 days (assuming pain not completely gone today) If worsening on antibiotics or not resolving he should let me know Would leave colonoscopy as scheduled for now

## 2018-06-13 ENCOUNTER — Telehealth: Payer: Self-pay | Admitting: Physician Assistant

## 2018-06-13 NOTE — Telephone Encounter (Signed)
Called and spoke with the patient.  Informed him that I had communicated with Dr. Rayann Heman, his wife's concerns particularly about his pending colon surgery and slow HR.  Dr. Rayann Heman re-confirmed his bradycardia is chronic and stable for years, and felt he would be fine to procedure with surgery.  The patient appreciated the call.  Tommye Standard, PA-C

## 2018-06-15 NOTE — Telephone Encounter (Signed)
Per - Baldwin [place], PA-C     06/13/18 3:48 PM  Note    Called and spoke with the patient.  Informed him that I had communicated with Dr. Rayann Heman, his wife's concerns particularly about his pending colon surgery and slow HR.  Dr. Rayann Heman re-confirmed his bradycardia is chronic and stable for years, and felt he would be fine to procedure with surgery.  The patient appreciated the call.  Tommye Standard, PA-C      I will route this recommendation to the requesting party via Epic fax function and remove from pre-op pool.  Please call with questions.  [place], [place] 06/15/2018, 2:36 PM

## 2018-06-19 LAB — CUP PACEART REMOTE DEVICE CHECK
Date Time Interrogation Session: 20191005082042
Implantable Pulse Generator Implant Date: 20161028

## 2018-06-28 ENCOUNTER — Ambulatory Visit: Payer: 59 | Admitting: Internal Medicine

## 2018-07-05 NOTE — Patient Instructions (Addendum)
Jorge Peterson  07/05/2018   Your procedure is scheduled on: Wednesday 07/12/2018  Report to [facility] Main  Entrance              Report to admitting at  0930  AM    How to Manage Your Diabetes Before and After Surgery  Why is it important to control my blood sugar before and after surgery? . Improving blood sugar levels before and after surgery helps healing and can limit problems. . A way of improving blood sugar control is eating a healthy diet by: o  Eating less sugar and carbohydrates o  Increasing activity/exercise o  Talking with your doctor about reaching your blood sugar goals . High blood sugars (greater than 180 mg/dL) can raise your risk of infections and slow your recovery, so you will need to focus on controlling your diabetes during the weeks before surgery. . Make sure that the doctor who takes care of your diabetes knows about your planned surgery including the date and location.  How do I manage my blood sugar before surgery? . Check your blood sugar at least 4 times a day, starting 2 days before surgery, to make sure that the level is not too high or low. o Check your blood sugar the morning of your surgery when you wake up and every 2 hours until you get to the Short Stay unit. . If your blood sugar is less than 70 mg/dL, you will need to treat for low blood sugar: o Do not take insulin. o Treat a low blood sugar (less than 70 mg/dL) with  cup of clear juice (cranberry or apple), 4 glucose tablets, OR glucose gel. o Recheck blood sugar in 15 minutes after treatment (to make sure it is greater than 70 mg/dL). If your blood sugar is not greater than 70 mg/dL on recheck, call [phone] for further instructions. . Report your blood sugar to the short stay nurse when you get to Short Stay.  . If you are admitted to the hospital after surgery: o Your blood sugar will be checked by the staff and you will probably be given insulin after  surgery (instead of oral diabetes medicines) to make sure you have good blood sugar levels. o The goal for blood sugar control after surgery is 80-180 mg/dL.   WHAT DO I DO ABOUT MY DIABETES MEDICATION?        The day before surgery, Take Metformin as usual.  . Do not take oral diabetes medicines (pills) the morning of surgery.    Call this number if you have problems the morning of surgery [phone]          Follow bowel prep instructions from Dr. Marcello Moores office the day before surgery on Tuesday 07/11/2018 and follow a clear liquid diet.   Remember: Do not eat food  :After Midnight.   DRINK 2 PRESURGERY ENSURE DRINKS THE NIGHT BEFORE SURGERY AT  1000 PM AND 1 PRESURGERY DRINK THE DAY OF THE PROCEDURE 3 HOURS PRIOR TO SCHEDULED SURGERY. NO SOLIDS AFTER MIDNIGHT THE DAY PRIOR TO THE SURGERY. NOTHING BY MOUTH EXCEPT CLEAR LIQUIDS UNTIL THREE HOURS PRIOR TO SCHEDULED SURGERY. PLEASE FINISH PRESURGERY ENSURE DRINK PER SURGEON ORDER 3 HOURS PRIOR TO SCHEDULED SURGERY TIME WHICH NEEDS TO BE COMPLETED AT   0830 am    CLEAR LIQUID DIET   Foods Allowed  Foods Excluded  Coffee and tea, regular and decaf                             liquids that you cannot  Plain Jell-O in any flavor                                             see through such as: Fruit ices (not with fruit pulp)                                     milk, soups, orange juice  Iced Popsicles                                    All solid food Carbonated beverages, regular and diet                                    Cranberry, grape and apple juices Sports drinks like Gatorade Lightly seasoned clear broth or consume(fat free) Sugar, honey syrup  Sample Menu Breakfast                                Lunch                                     Supper Cranberry juice                    Beef broth                            Chicken broth Jell-O                                      Grape juice                           Apple juice Coffee or tea                        Jell-O                                      Popsicle                                                Coffee or tea                        Coffee or tea  _____________________________________________________________________               BRUSH YOUR TEETH MORNING OF SURGERY AND RINSE YOUR  MOUTH OUT, NO CHEWING GUM CANDY OR MINTS.     Take these medicines the morning of surgery with A SIP OF WATER: Omeprazole (Prilosec), Flecainide (Tambocor)               DO NOT TAKE ANY DIABETIC MEDICATIONS DAY OF YOUR SURGERY!                               You may not have any metal on your body including hair pins and              piercings  Do not wear jewelry, make-up, lotions, powders or perfumes, deodorant                        Men may shave face and neck.   Do not bring valuables to the hospital. [place].  Contacts, dentures or bridgework may not be worn into surgery.  Leave suitcase in the car. After surgery it may be brought to your room.                  Please read over the following fact sheets you were given: _____________________________________________________________________             [facility] - Preparing for Surgery Before surgery, you can play an important role.  Because skin is not sterile, your skin needs to be as free of germs as possible.  You can reduce the number of germs on your skin by washing with CHG (chlorahexidine gluconate) soap before surgery.  CHG is an antiseptic cleaner which kills germs and bonds with the skin to continue killing germs even after washing. Please DO NOT use if you have an allergy to CHG or antibacterial soaps.  If your skin becomes reddened/irritated stop using the CHG and inform your nurse when you arrive at Short Stay. Do not shave (including legs and underarms) for at least 48 hours prior  to the first CHG shower.  You may shave your face/neck. Please follow these instructions carefully:  1.  Shower with CHG Soap the night before surgery and the  morning of Surgery.  2.  If you choose to wash your hair, wash your hair first as usual with your  normal  shampoo.  3.  After you shampoo, rinse your hair and body thoroughly to remove the  shampoo.                           4.  Use CHG as you would any other liquid soap.  You can apply chg directly  to the skin and wash                       Gently with a scrungie or clean washcloth.  5.  Apply the CHG Soap to your body ONLY FROM THE NECK DOWN.   Do not use on face/ open                           Wound or open sores. Avoid contact with eyes, ears mouth and genitals (private parts).  Wash face,  Genitals (private parts) with your normal soap.             6.  Wash thoroughly, paying special attention to the area where your surgery  will be performed.  7.  Thoroughly rinse your body with warm water from the neck down.  8.  DO NOT shower/wash with your normal soap after using and rinsing off  the CHG Soap.                9.  Pat yourself dry with a clean towel.            10.  Wear clean pajamas.            11.  Place clean sheets on your bed the night of your first shower and do not  sleep with pets. Day of Surgery : Do not apply any lotions/deodorants the morning of surgery.  Please wear clean clothes to the hospital/surgery center.  FAILURE TO FOLLOW THESE INSTRUCTIONS MAY RESULT IN THE CANCELLATION OF YOUR SURGERY PATIENT SIGNATURE_________________________________  NURSE SIGNATURE__________________________________  _

## 2018-07-06 ENCOUNTER — Ambulatory Visit (INDEPENDENT_AMBULATORY_CARE_PROVIDER_SITE_OTHER): Payer: 59 | Admitting: *Deleted

## 2018-07-06 ENCOUNTER — Encounter (HOSPITAL_COMMUNITY): Payer: Self-pay

## 2018-07-06 ENCOUNTER — Encounter (HOSPITAL_COMMUNITY)
Admission: RE | Admit: 2018-07-06 | Discharge: 2018-07-06 | Disposition: A | Discharge status: Home / Self Care | Payer: 59 | Source: Ambulatory Visit | Attending: General Surgery | Admitting: General Surgery

## 2018-07-06 ENCOUNTER — Other Ambulatory Visit: Payer: Self-pay

## 2018-07-06 DIAGNOSIS — I480 Paroxysmal atrial fibrillation: Secondary | ICD-10-CM

## 2018-07-06 DIAGNOSIS — Z01812 Encounter for preprocedural laboratory examination: Secondary | ICD-10-CM | POA: Insufficient documentation

## 2018-07-06 LAB — CBC
HCT: 43.9 % (ref 39.0–52.0)
HEMOGLOBIN: 14.1 g/dL (ref 13.0–17.0)
MCH: 27.2 pg (ref 26.0–34.0)
MCHC: 32.1 g/dL (ref 30.0–36.0)
MCV: 84.7 fL (ref 80.0–100.0)
NRBC: 0 % (ref 0.0–0.2)
Platelets: 199 10*3/uL (ref 150–400)
RBC: 5.18 MIL/uL (ref 4.22–5.81)
RDW: 13.1 % (ref 11.5–15.5)
WBC: 8.4 10*3/uL (ref 4.0–10.5)

## 2018-07-06 LAB — BASIC METABOLIC PANEL
ANION GAP: 8 (ref 5–15)
BUN: 21 mg/dL — AB (ref 6–20)
CO2: 28 mmol/L (ref 22–32)
Calcium: 9.3 mg/dL (ref 8.9–10.3)
Chloride: 105 mmol/L (ref 98–111)
Creatinine, Ser: 1.16 mg/dL (ref 0.61–1.24)
GFR calc non Af Amer: >60 mL/min (ref >60)
Glucose, Bld: 131 mg/dL — ABNORMAL HIGH (ref 70–99)
POTASSIUM: 3.8 mmol/L (ref 3.5–5.1)
SODIUM: 141 mmol/L (ref 135–145)

## 2018-07-06 LAB — HEMOGLOBIN A1C
Hgb A1c MFr Bld: 6.4 % — ABNORMAL HIGH (ref 4.8–5.6)
MEAN PLASMA GLUCOSE: 136.98 mg/dL

## 2018-07-06 LAB — GLUCOSE, CAPILLARY: GLUCOSE-CAPILLARY: 119 mg/dL — AB (ref 70–99)

## 2018-07-06 NOTE — Progress Notes (Signed)
Carelink Summary Report / Loop Recorder 

## 2018-07-06 NOTE — Progress Notes (Addendum)
06/07/2018- Cardiac Clearance from Dr. Rayann Heman per Tommye Standard, PA on chart and in [facility]  06/05/2018- Noted in Epic-Last device check for Loop Recorder  05/16/2018- noted in [place]  05/15/2018- noted in [place] ab[place]. Pelvis w/ contrast

## 2018-07-11 ENCOUNTER — Ambulatory Visit (AMBULATORY_SURGERY_CENTER): Payer: 59 | Admitting: Internal Medicine

## 2018-07-11 ENCOUNTER — Encounter: Payer: Self-pay | Admitting: Internal Medicine

## 2018-07-11 VITALS — BP 118/67 | HR 51 | Temp 97.5°F | Resp 13 | Ht 67.0 in | Wt 218.0 lb

## 2018-07-11 DIAGNOSIS — D120 Benign neoplasm of cecum: Secondary | ICD-10-CM

## 2018-07-11 DIAGNOSIS — K5730 Diverticulosis of large intestine without perforation or abscess without bleeding: Secondary | ICD-10-CM | POA: Diagnosis not present

## 2018-07-11 DIAGNOSIS — K648 Other hemorrhoids: Secondary | ICD-10-CM

## 2018-07-11 DIAGNOSIS — D123 Benign neoplasm of transverse colon: Secondary | ICD-10-CM

## 2018-07-11 DIAGNOSIS — D124 Benign neoplasm of descending colon: Secondary | ICD-10-CM

## 2018-07-11 DIAGNOSIS — Z8719 Personal history of other diseases of the digestive system: Secondary | ICD-10-CM | POA: Diagnosis not present

## 2018-07-11 MED ORDER — BUPIVACAINE LIPOSOME 1.3 % IJ SUSP
20.0000 mL | INTRAMUSCULAR | Status: DC
  Filled 2018-07-11: qty 20

## 2018-07-11 MED ORDER — SODIUM CHLORIDE 0.9 % IV SOLN: 500.0000 mL | Freq: Once | INTRAVENOUS | Status: DC

## 2018-07-11 NOTE — Progress Notes (Signed)
Pt's states no medical or surgical changes since previsit or office visit. 

## 2018-07-11 NOTE — Progress Notes (Signed)
Called to room to assist during endoscopic procedure.  Patient ID and intended procedure confirmed with present staff. Received instructions for my participation in the procedure from the performing physician.  

## 2018-07-11 NOTE — Progress Notes (Signed)
Report to RN, VSS, adequate respirations noted, no c/o pain or discomfort 

## 2018-07-11 NOTE — Op Note (Signed)
[place] Patient Name: Jorge Peterson Procedure Date: 07/11/2018 1:07 PM MRN: [id number] Endoscopist: Jerene Bears , MD Age: 57 Referring MD:  Date of Birth: 1961/02/15 Gender: Male Account #: [id number] Procedure:                Colonoscopy Indications:              Follow-up of diverticulitis and before left                            colectomy for recurrent and complicated                            diverticulitis, incidental personal history of                            colon polyps Medicines:                Monitored Anesthesia Care Procedure:                Pre-Anesthesia Assessment:                           - Prior to the procedure, a History and Physical                            was performed, and patient medications and                            allergies were reviewed. The patient's tolerance of                            previous anesthesia was also reviewed. The risks                            and benefits of the procedure and the sedation                            options and risks were discussed with the patient.                            All questions were answered, and informed consent                            was obtained. Prior Anticoagulants: The patient has                            taken no previous anticoagulant or antiplatelet                            agents. ASA Grade Assessment: II - A patient with                            mild systemic disease. After reviewing the risks  and benefits, the patient was deemed in                            satisfactory condition to undergo the procedure.                           After obtaining informed consent, the colonoscope                            was passed under direct vision. Throughout the                            procedure, the patient's blood pressure, pulse, and                            oxygen saturations were monitored continuously. The            Colonoscope was introduced through the anus and                            advanced to the cecum, identified by appendiceal                            orifice and ileocecal valve. The colonoscopy was                            performed without difficulty. The patient tolerated                            the procedure well. The quality of the bowel                            preparation was excellent. The ileocecal valve,                            appendiceal orifice, and rectum were photographed. Scope In: 1:13:33 PM Scope Out: 1:25:54 PM Scope Withdrawal Time: 0 hours 10 minutes 10 seconds  Total Procedure Duration: 0 hours 12 minutes 21 seconds  Findings:                 The digital rectal exam was normal.                           A 4 mm polyp was found in the cecum. The polyp was                            sessile. The polyp was removed with a cold snare.                            Resection and retrieval were complete.                           A 2 mm polyp was found in the descending colon. The  polyp was sessile. The polyp was removed with a                            cold snare. Resection and retrieval were complete.                           Many small and large-mouthed diverticula were found                            in the sigmoid colon and descending colon.                           Internal hemorrhoids were found during                            retroflexion. The hemorrhoids were small.                           The exam was otherwise without abnormality. Complications:            No immediate complications. Estimated Blood Loss:     Estimated blood loss was minimal. Impression:               - One 4 mm polyp in the cecum, removed with a cold                            snare. Resected and retrieved.                           - One 2 mm polyp in the descending colon, removed                            with a cold snare. Resected and  retrieved.                           - Moderate diverticulosis in the sigmoid colon and                            in the descending colon.                           - Small internal hemorrhoids.                           - The examination was otherwise normal. Recommendation:           - Patient has a contact number available for                            emergencies. The signs and symptoms of potential                            delayed complications were discussed with the  patient. Return to normal activities tomorrow.                            Written discharge instructions were provided to the                            patient.                           - Resume clear liquid diet until surgery tomorrow.                            NPO at previously arranged time with Dr. Marcello Moores.                           - Continue present medications.                           - Await pathology results.                           - Repeat colonoscopy is recommended for                            surveillance. The colonoscopy date will be                            determined after pathology results from today's                            exam become available for review. Jerene Bears, MD 07/11/2018 1:32:38 PM This report has been signed electronically. CC Letter to:             Leighton Ruff, MD

## 2018-07-11 NOTE — Patient Instructions (Signed)
Handouts:  Diverticulosis, Polyps, and hemorrhoids  YOU HAD AN ENDOSCOPIC PROCEDURE TODAY AT [facility]:   Refer to the procedure report that was given to you for any specific questions about what was found during the examination.  If the procedure report does not answer your questions, please call your gastroenterologist to clarify.  If you requested that your care partner not be given the details of your procedure findings, then the procedure report has been included in a sealed envelope for you to review at your convenience later.  YOU SHOULD EXPECT: Some feelings of bloating in the abdomen. Passage of more gas than usual.  Walking can help get rid of the air that was put into your GI tract during the procedure and reduce the bloating. If you had a lower endoscopy (such as a colonoscopy or flexible sigmoidoscopy) you may notice spotting of blood in your stool or on the toilet paper. If you underwent a bowel prep for your procedure, you may not have a normal bowel movement for a few days.  Please Note:  You might notice some irritation and congestion in your nose or some drainage.  This is from the oxygen used during your procedure.  There is no need for concern and it should clear up in a day or so.  SYMPTOMS TO REPORT IMMEDIATELY:   Following lower endoscopy (colonoscopy or flexible sigmoidoscopy):  Excessive amounts of blood in the stool  Significant tenderness or worsening of abdominal pains  Swelling of the abdomen that is new, acute  Fever of 100F or higher  For urgent or emergent issues, a gastroenterologist can be reached at any hour by calling [phone].   DIET:  We do recommend a small meal at first, but then you may proceed to your regular diet.  Drink plenty of fluids but you should avoid alcoholic beverages for 24 hours.  ACTIVITY:  You should plan to take it easy for the rest of today and you should NOT DRIVE or use heavy machinery until tomorrow (because  of the sedation medicines used during the test).    FOLLOW UP: Our staff will call the number listed on your records the next business day following your procedure to check on you and address any questions or concerns that you may have regarding the information given to you following your procedure. If we do not reach you, we will leave a message.  However, if you are feeling well and you are not experiencing any problems, there is no need to return our call.  We will assume that you have returned to your regular daily activities without incident.  If any biopsies were taken you will be contacted by phone or by letter within the next 1-3 weeks.  Please call us at [phone] if you have not heard about the biopsies in 3 weeks.    SIGNATURES/CONFIDENTIALITY: You and/or your care partner have signed paperwork which will be entered into your electronic medical record.  These signatures attest to the fact that that the information above on your After Visit Summary has been reviewed and is understood.  Full responsibility of the confidentiality of this discharge information lies with you and/or your care-partner.

## 2018-07-11 NOTE — Anesthesia Preprocedure Evaluation (Addendum)
Anesthesia Evaluation  Patient identified by MRN, date of birth, ID band Patient awake    Reviewed: Allergy & Precautions, NPO status , Patient's Chart, lab work & pertinent test results  History of Anesthesia Complications (+) Family history of anesthesia reaction  Airway Mallampati: III  TM Distance: >3 FB Neck ROM: Full    Dental no notable dental hx. (+) Teeth Intact, Dental Advisory Given   Pulmonary neg pulmonary ROS,    Pulmonary exam normal breath sounds clear to auscultation       Cardiovascular hypertension, Pt. on medications and Pt. on home beta blockers Normal cardiovascular exam+ dysrhythmias Atrial Fibrillation  Rhythm:Regular Rate:Normal  SB w 1 av block   Neuro/Psych negative neurological ROS  negative psych ROS   GI/Hepatic Neg liver ROS, GERD  Medicated and Controlled,  Endo/Other  diabetes, Type 2  Renal/GU negative Renal ROS  negative genitourinary   Musculoskeletal negative musculoskeletal ROS (+)   Abdominal (+) + obese,   Peds negative pediatric ROS (+)  Hematology negative hematology ROS (+)   Anesthesia Other Findings Just dc from [facility]  Reproductive/Obstetrics                            Lab Results  Component Value Date   CREATININE 1.16 07/06/2018   BUN 21 (H) 07/06/2018   NA 141 07/06/2018   K 3.8 07/06/2018   CL 105 07/06/2018   CO2 28 07/06/2018    Lab Results  Component Value Date   WBC 8.4 07/06/2018   HGB 14.1 07/06/2018   HCT 43.9 07/06/2018   MCV 84.7 07/06/2018   PLT 199 07/06/2018    Anesthesia Physical Anesthesia Plan  ASA: III  Anesthesia Plan: General   Post-op Pain Management:    Induction: Intravenous  PONV Risk Score and Plan: 2 and Treatment may vary due to age or medical condition, Dexamethasone, Ondansetron and Midazolam  Airway Management Planned: Oral ETT  Additional Equipment:   Intra-op Plan:   Post-operative  Plan: Extubation in OR  Informed Consent: I have reviewed the patients History and Physical, chart, labs and discussed the procedure including the risks, benefits and alternatives for the proposed anesthesia with the patient or authorized representative who has indicated his/her understanding and acceptance.   Dental advisory given  Plan Discussed with: CRNA  Anesthesia Plan Comments: (Lidocaine drip ket)       Anesthesia Quick Evaluation

## 2018-07-12 ENCOUNTER — Inpatient Hospital Stay (HOSPITAL_COMMUNITY): Payer: 59 | Admitting: Anesthesiology

## 2018-07-12 ENCOUNTER — Telehealth: Payer: Self-pay | Admitting: *Deleted

## 2018-07-12 ENCOUNTER — Encounter (HOSPITAL_COMMUNITY): Payer: Self-pay

## 2018-07-12 ENCOUNTER — Inpatient Hospital Stay (HOSPITAL_COMMUNITY)
Admission: RE | Admit: 2018-07-12 | Discharge: 2018-07-14 | DRG: 331 | Disposition: A | Discharge status: Home / Self Care | Payer: 59 | Attending: General Surgery | Admitting: General Surgery

## 2018-07-12 ENCOUNTER — Other Ambulatory Visit: Payer: Self-pay

## 2018-07-12 ENCOUNTER — Encounter (HOSPITAL_COMMUNITY)
Admission: RE | Disposition: A | Discharge status: Home / Self Care | Payer: Self-pay | Source: Home / Self Care | Attending: General Surgery

## 2018-07-12 DIAGNOSIS — Z9049 Acquired absence of other specified parts of digestive tract: Secondary | ICD-10-CM | POA: Diagnosis not present

## 2018-07-12 DIAGNOSIS — E669 Obesity, unspecified: Secondary | ICD-10-CM | POA: Diagnosis present

## 2018-07-12 DIAGNOSIS — K5730 Diverticulosis of large intestine without perforation or abscess without bleeding: Secondary | ICD-10-CM | POA: Diagnosis not present

## 2018-07-12 DIAGNOSIS — K5790 Diverticulosis of intestine, part unspecified, without perforation or abscess without bleeding: Secondary | ICD-10-CM | POA: Diagnosis present

## 2018-07-12 DIAGNOSIS — Z79899 Other long term (current) drug therapy: Secondary | ICD-10-CM

## 2018-07-12 DIAGNOSIS — Z8249 Family history of ischemic heart disease and other diseases of the circulatory system: Secondary | ICD-10-CM

## 2018-07-12 DIAGNOSIS — Z91048 Other nonmedicinal substance allergy status: Secondary | ICD-10-CM

## 2018-07-12 DIAGNOSIS — K219 Gastro-esophageal reflux disease without esophagitis: Secondary | ICD-10-CM | POA: Diagnosis present

## 2018-07-12 DIAGNOSIS — E7800 Pure hypercholesterolemia, unspecified: Secondary | ICD-10-CM | POA: Diagnosis present

## 2018-07-12 DIAGNOSIS — Z6834 Body mass index (BMI) 34.0-34.9, adult: Secondary | ICD-10-CM | POA: Diagnosis not present

## 2018-07-12 DIAGNOSIS — Z833 Family history of diabetes mellitus: Secondary | ICD-10-CM

## 2018-07-12 DIAGNOSIS — Z86010 Personal history of colonic polyps: Secondary | ICD-10-CM

## 2018-07-12 DIAGNOSIS — Z885 Allergy status to narcotic agent status: Secondary | ICD-10-CM

## 2018-07-12 DIAGNOSIS — E785 Hyperlipidemia, unspecified: Secondary | ICD-10-CM | POA: Diagnosis present

## 2018-07-12 DIAGNOSIS — I10 Essential (primary) hypertension: Secondary | ICD-10-CM | POA: Diagnosis present

## 2018-07-12 DIAGNOSIS — E119 Type 2 diabetes mellitus without complications: Secondary | ICD-10-CM | POA: Diagnosis present

## 2018-07-12 DIAGNOSIS — I480 Paroxysmal atrial fibrillation: Secondary | ICD-10-CM | POA: Diagnosis present

## 2018-07-12 LAB — GLUCOSE, CAPILLARY
GLUCOSE-CAPILLARY: 147 mg/dL — AB (ref 70–99)
Glucose-Capillary: 201 mg/dL — ABNORMAL HIGH (ref 70–99)

## 2018-07-12 SURGERY — COLECTOMY, PARTIAL, ROBOT-ASSISTED, LAPAROSCOPIC
Anesthesia: General

## 2018-07-12 MED ORDER — SACCHAROMYCES BOULARDII 250 MG PO CAPS
250.0000 mg | ORAL_CAPSULE | Freq: Two times a day (BID) | ORAL | Status: DC
  Administered 2018-07-12 – 2018-07-14 (×4): 250 mg via ORAL
  Filled 2018-07-12 (×4): qty 1

## 2018-07-12 MED ORDER — TRAMADOL HCL 50 MG PO TABS
50.0000 mg | ORAL_TABLET | Freq: Four times a day (QID) | ORAL | Status: DC | PRN
  Administered 2018-07-12 – 2018-07-13 (×3): 50 mg via ORAL
  Filled 2018-07-12 (×3): qty 1

## 2018-07-12 MED ORDER — LACTATED RINGERS IR SOLN
Status: DC | PRN
  Administered 2018-07-12: 1000 mL

## 2018-07-12 MED ORDER — LIDOCAINE 2% (20 MG/ML) 5 ML SYRINGE
INTRAMUSCULAR | Status: DC | PRN
  Administered 2018-07-12: 1.5 mg/kg/h via INTRAVENOUS

## 2018-07-12 MED ORDER — ACETAMINOPHEN 500 MG PO TABS
1000.0000 mg | ORAL_TABLET | Freq: Four times a day (QID) | ORAL | Status: DC
  Administered 2018-07-12 – 2018-07-14 (×6): 1000 mg via ORAL
  Filled 2018-07-12 (×6): qty 2

## 2018-07-12 MED ORDER — KETOROLAC TROMETHAMINE 30 MG/ML IJ SOLN
INTRAMUSCULAR | Status: AC
  Filled 2018-07-12: qty 1

## 2018-07-12 MED ORDER — ACETAMINOPHEN 500 MG PO TABS
1000.0000 mg | ORAL_TABLET | ORAL | Status: AC
  Administered 2018-07-12: 1000 mg via ORAL
  Filled 2018-07-12: qty 2

## 2018-07-12 MED ORDER — ONDANSETRON HCL 4 MG/2ML IJ SOLN
INTRAMUSCULAR | Status: AC
  Filled 2018-07-12: qty 2

## 2018-07-12 MED ORDER — SUGAMMADEX SODIUM 200 MG/2ML IV SOLN
INTRAVENOUS | Status: DC | PRN
  Administered 2018-07-12: 400 mg via INTRAVENOUS

## 2018-07-12 MED ORDER — PHENYLEPHRINE HCL 10 MG/ML IJ SOLN
INTRAMUSCULAR | Status: AC
  Filled 2018-07-12: qty 1

## 2018-07-12 MED ORDER — MIDAZOLAM HCL 2 MG/2ML IJ SOLN
INTRAMUSCULAR | Status: AC
  Filled 2018-07-12: qty 2

## 2018-07-12 MED ORDER — KETOROLAC TROMETHAMINE 30 MG/ML IJ SOLN
30.0000 mg | Freq: Once | INTRAMUSCULAR | Status: AC | PRN
  Administered 2018-07-12: 30 mg via INTRAVENOUS

## 2018-07-12 MED ORDER — MIDAZOLAM HCL 5 MG/5ML IJ SOLN
INTRAMUSCULAR | Status: DC | PRN
  Administered 2018-07-12: 2 mg via INTRAVENOUS

## 2018-07-12 MED ORDER — ALVIMOPAN 12 MG PO CAPS
12.0000 mg | ORAL_CAPSULE | Freq: Two times a day (BID) | ORAL | Status: DC
  Administered 2018-07-13 (×2): 12 mg via ORAL
  Filled 2018-07-12 (×3): qty 1

## 2018-07-12 MED ORDER — ALUM & MAG HYDROXIDE-SIMETH 200-200-20 MG/5ML PO SUSP: 30.0000 mL | Freq: Four times a day (QID) | ORAL | Status: DC | PRN

## 2018-07-12 MED ORDER — PROPOFOL 10 MG/ML IV BOLUS
INTRAVENOUS | Status: AC
  Filled 2018-07-12: qty 20

## 2018-07-12 MED ORDER — FENTANYL CITRATE (PF) 250 MCG/5ML IJ SOLN
INTRAMUSCULAR | Status: AC
  Filled 2018-07-12: qty 5

## 2018-07-12 MED ORDER — ROCURONIUM BROMIDE 10 MG/ML (PF) SYRINGE
PREFILLED_SYRINGE | INTRAVENOUS | Status: AC
  Filled 2018-07-12: qty 10

## 2018-07-12 MED ORDER — BUPIVACAINE HCL (PF) 0.25 % IJ SOLN
INTRAMUSCULAR | Status: DC | PRN
  Administered 2018-07-12: 30 mL

## 2018-07-12 MED ORDER — KETAMINE HCL 10 MG/ML IJ SOLN
INTRAMUSCULAR | Status: DC | PRN
  Administered 2018-07-12: 20 mg via INTRAVENOUS
  Administered 2018-07-12: 30 mg via INTRAVENOUS

## 2018-07-12 MED ORDER — LACTATED RINGERS IV SOLN
INTRAVENOUS | Status: DC
  Administered 2018-07-12 (×2): via INTRAVENOUS

## 2018-07-12 MED ORDER — FLECAINIDE ACETATE 50 MG PO TABS
50.0000 mg | ORAL_TABLET | Freq: Two times a day (BID) | ORAL | Status: DC
  Administered 2018-07-12 – 2018-07-14 (×4): 50 mg via ORAL
  Filled 2018-07-12 (×4): qty 1

## 2018-07-12 MED ORDER — DEXAMETHASONE SODIUM PHOSPHATE 10 MG/ML IJ SOLN
INTRAMUSCULAR | Status: AC
  Filled 2018-07-12: qty 1

## 2018-07-12 MED ORDER — GLYCOPYRROLATE PF 0.2 MG/ML IJ SOSY
PREFILLED_SYRINGE | INTRAMUSCULAR | Status: DC | PRN
  Administered 2018-07-12: .1 mg via INTRAVENOUS

## 2018-07-12 MED ORDER — SODIUM CHLORIDE 0.9 % IV SOLN
2.0000 g | INTRAVENOUS | Status: AC
  Administered 2018-07-12: 2 g via INTRAVENOUS
  Filled 2018-07-12: qty 2

## 2018-07-12 MED ORDER — KCL IN DEXTROSE-NACL 20-5-0.45 MEQ/L-%-% IV SOLN
INTRAVENOUS | Status: DC
  Administered 2018-07-12: 18:00:00 via INTRAVENOUS
  Filled 2018-07-12: qty 1000

## 2018-07-12 MED ORDER — ONDANSETRON HCL 4 MG/2ML IJ SOLN: 4.0000 mg | Freq: Once | INTRAMUSCULAR | Status: DC | PRN

## 2018-07-12 MED ORDER — ENOXAPARIN SODIUM 40 MG/0.4ML ~~LOC~~ SOLN
40.0000 mg | SUBCUTANEOUS | Status: DC
  Administered 2018-07-13: 40 mg via SUBCUTANEOUS
  Filled 2018-07-12: qty 0.4

## 2018-07-12 MED ORDER — LIDOCAINE 2% (20 MG/ML) 5 ML SYRINGE
INTRAMUSCULAR | Status: AC
  Filled 2018-07-12: qty 5

## 2018-07-12 MED ORDER — ROCURONIUM BROMIDE 10 MG/ML (PF) SYRINGE
PREFILLED_SYRINGE | INTRAVENOUS | Status: DC | PRN
  Administered 2018-07-12: 20 mg via INTRAVENOUS
  Administered 2018-07-12: 60 mg via INTRAVENOUS

## 2018-07-12 MED ORDER — FENTANYL CITRATE (PF) 100 MCG/2ML IJ SOLN: 25.0000 ug | INTRAMUSCULAR | Status: DC | PRN

## 2018-07-12 MED ORDER — LIDOCAINE 2% (20 MG/ML) 5 ML SYRINGE
INTRAMUSCULAR | Status: DC | PRN
  Administered 2018-07-12: 60 mg via INTRAVENOUS

## 2018-07-12 MED ORDER — HYDROMORPHONE HCL 1 MG/ML IJ SOLN
INTRAMUSCULAR | Status: AC
  Filled 2018-07-12: qty 1

## 2018-07-12 MED ORDER — ONDANSETRON HCL 4 MG/2ML IJ SOLN
INTRAMUSCULAR | Status: DC | PRN
  Administered 2018-07-12: 4 mg via INTRAVENOUS

## 2018-07-12 MED ORDER — GABAPENTIN 300 MG PO CAPS
300.0000 mg | ORAL_CAPSULE | Freq: Two times a day (BID) | ORAL | Status: DC
  Administered 2018-07-12 – 2018-07-14 (×4): 300 mg via ORAL
  Filled 2018-07-12 (×4): qty 1

## 2018-07-12 MED ORDER — METFORMIN HCL ER 500 MG PO TB24: 500.0000 mg | ORAL_TABLET | Freq: Every day | ORAL | Status: DC

## 2018-07-12 MED ORDER — PANTOPRAZOLE SODIUM 40 MG PO TBEC
80.0000 mg | DELAYED_RELEASE_TABLET | Freq: Every day | ORAL | Status: DC
  Administered 2018-07-13 – 2018-07-14 (×2): 80 mg via ORAL
  Filled 2018-07-12 (×2): qty 2

## 2018-07-12 MED ORDER — DEXAMETHASONE SODIUM PHOSPHATE 10 MG/ML IJ SOLN
INTRAMUSCULAR | Status: DC | PRN
  Administered 2018-07-12: 10 mg via INTRAVENOUS

## 2018-07-12 MED ORDER — 0.9 % SODIUM CHLORIDE (POUR BTL) OPTIME
TOPICAL | Status: DC | PRN
  Administered 2018-07-12: 2000 mL

## 2018-07-12 MED ORDER — ONDANSETRON HCL 4 MG/2ML IJ SOLN: 4.0000 mg | Freq: Four times a day (QID) | INTRAMUSCULAR | Status: DC | PRN

## 2018-07-12 MED ORDER — ONDANSETRON HCL 4 MG PO TABS: 4.0000 mg | ORAL_TABLET | Freq: Four times a day (QID) | ORAL | Status: DC | PRN

## 2018-07-12 MED ORDER — GABAPENTIN 300 MG PO CAPS
300.0000 mg | ORAL_CAPSULE | ORAL | Status: AC
  Administered 2018-07-12: 300 mg via ORAL
  Filled 2018-07-12: qty 1

## 2018-07-12 MED ORDER — HYDROMORPHONE HCL 1 MG/ML IJ SOLN
0.5000 mg | INTRAMUSCULAR | Status: DC | PRN
  Administered 2018-07-12: 0.5 mg via INTRAVENOUS

## 2018-07-12 MED ORDER — SODIUM CHLORIDE 0.9 % IV SOLN
2.0000 g | Freq: Two times a day (BID) | INTRAVENOUS | Status: AC
  Administered 2018-07-12: 2 g via INTRAVENOUS
  Filled 2018-07-12: qty 2

## 2018-07-12 MED ORDER — METOPROLOL SUCCINATE ER 25 MG PO TB24
12.5000 mg | ORAL_TABLET | Freq: Every day | ORAL | Status: DC
  Administered 2018-07-12 – 2018-07-13 (×2): 12.5 mg via ORAL
  Filled 2018-07-12 (×2): qty 1

## 2018-07-12 MED ORDER — SIMVASTATIN 20 MG PO TABS
20.0000 mg | ORAL_TABLET | Freq: Every day | ORAL | Status: DC
  Administered 2018-07-12 – 2018-07-13 (×2): 20 mg via ORAL
  Filled 2018-07-12 (×2): qty 1

## 2018-07-12 MED ORDER — PROPOFOL 10 MG/ML IV BOLUS
INTRAVENOUS | Status: DC | PRN
  Administered 2018-07-12: 180 mg via INTRAVENOUS

## 2018-07-12 MED ORDER — ENSURE SURGERY PO LIQD
237.0000 mL | Freq: Two times a day (BID) | ORAL | Status: DC
  Administered 2018-07-13 (×2): 237 mL via ORAL
  Filled 2018-07-12 (×4): qty 237

## 2018-07-12 MED ORDER — GLYCOPYRROLATE PF 0.2 MG/ML IJ SOSY
PREFILLED_SYRINGE | INTRAMUSCULAR | Status: AC
  Filled 2018-07-12: qty 1

## 2018-07-12 MED ORDER — BUPIVACAINE-EPINEPHRINE (PF) 0.25% -1:200000 IJ SOLN
INTRAMUSCULAR | Status: AC
  Filled 2018-07-12: qty 30

## 2018-07-12 MED ORDER — HYDROCHLOROTHIAZIDE 25 MG PO TABS
12.5000 mg | ORAL_TABLET | Freq: Every day | ORAL | Status: DC
  Administered 2018-07-12 – 2018-07-14 (×3): 12.5 mg via ORAL
  Filled 2018-07-12 (×3): qty 1

## 2018-07-12 MED ORDER — MEPERIDINE HCL 50 MG/ML IJ SOLN: 6.2500 mg | INTRAMUSCULAR | Status: DC | PRN

## 2018-07-12 MED ORDER — ALVIMOPAN 12 MG PO CAPS
12.0000 mg | ORAL_CAPSULE | ORAL | Status: AC
  Administered 2018-07-12: 12 mg via ORAL
  Filled 2018-07-12: qty 1

## 2018-07-12 MED ORDER — BUPIVACAINE LIPOSOME 1.3 % IJ SUSP
INTRAMUSCULAR | Status: DC | PRN
  Administered 2018-07-12: 20 mL

## 2018-07-12 MED ORDER — LOSARTAN POTASSIUM 50 MG PO TABS
100.0000 mg | ORAL_TABLET | Freq: Every day | ORAL | Status: DC
  Administered 2018-07-13: 100 mg via ORAL
  Filled 2018-07-12: qty 2

## 2018-07-12 MED ORDER — FENTANYL CITRATE (PF) 250 MCG/5ML IJ SOLN
INTRAMUSCULAR | Status: DC | PRN
  Administered 2018-07-12: 50 ug via INTRAVENOUS
  Administered 2018-07-12: 100 ug via INTRAVENOUS
  Administered 2018-07-12 (×2): 50 ug via INTRAVENOUS

## 2018-07-12 SURGICAL SUPPLY — 105 items
ADH SKN CLS APL DERMABOND .7 (GAUZE/BANDAGES/DRESSINGS) ×1
BLADE EXTENDED COATED 6.5IN (ELECTRODE) IMPLANT
CANNULA REDUC XI 12-8 STAPL (CANNULA) ×1
CANNULA REDUCER 12-8 DVNC XI (CANNULA) ×1 IMPLANT
CELLS DAT CNTRL 66122 CELL SVR (MISCELLANEOUS) IMPLANT
CLIP VESOLOCK LG 6/CT PURPLE (CLIP) IMPLANT
CLIP VESOLOCK MED 6/CT (CLIP) IMPLANT
COVER SURGICAL LIGHT HANDLE (MISCELLANEOUS) ×4 IMPLANT
COVER TIP SHEARS 8 DVNC (MISCELLANEOUS) ×1 IMPLANT
COVER TIP SHEARS 8MM DA VINCI (MISCELLANEOUS) ×1
COVER WAND RF STERILE (DRAPES) ×1 IMPLANT
DECANTER SPIKE VIAL GLASS SM (MISCELLANEOUS) ×1 IMPLANT
DERMABOND ADVANCED (GAUZE/BANDAGES/DRESSINGS) ×1
DERMABOND ADVANCED .7 DNX12 (GAUZE/BANDAGES/DRESSINGS) IMPLANT
DRAIN CHANNEL 19F RND (DRAIN) IMPLANT
DRAPE ARM DVNC X/XI (DISPOSABLE) ×4 IMPLANT
DRAPE COLUMN DVNC XI (DISPOSABLE) ×1 IMPLANT
DRAPE DA VINCI XI ARM (DISPOSABLE) ×4
DRAPE DA VINCI XI COLUMN (DISPOSABLE) ×1
DRAPE SURG IRRIG POUCH 19X23 (DRAPES) ×2 IMPLANT
DRSG OPSITE POSTOP 4X10 (GAUZE/BANDAGES/DRESSINGS) IMPLANT
DRSG OPSITE POSTOP 4X6 (GAUZE/BANDAGES/DRESSINGS) ×1 IMPLANT
DRSG OPSITE POSTOP 4X8 (GAUZE/BANDAGES/DRESSINGS) IMPLANT
ELECT PENCIL ROCKER SW 15FT (MISCELLANEOUS) ×2 IMPLANT
ELECT REM PT RETURN 15FT ADLT (MISCELLANEOUS) ×2 IMPLANT
ENDOLOOP SUT PDS II  0 18 (SUTURE)
ENDOLOOP SUT PDS II 0 18 (SUTURE) IMPLANT
EVACUATOR SILICONE 100CC (DRAIN) IMPLANT
GAUZE SPONGE 4X4 12PLY STRL (GAUZE/BANDAGES/DRESSINGS) IMPLANT
GLOVE BIO SURGEON STRL SZ 6.5 (GLOVE) ×3 IMPLANT
GLOVE BIOGEL PI IND STRL 6.5 (GLOVE) IMPLANT
GLOVE BIOGEL PI IND STRL 7.0 (GLOVE) ×3 IMPLANT
GLOVE BIOGEL PI IND STRL 7.5 (GLOVE) ×2 IMPLANT
GLOVE BIOGEL PI INDICATOR 6.5 (GLOVE) ×2
GLOVE BIOGEL PI INDICATOR 7.0 (GLOVE) ×7
GLOVE BIOGEL PI INDICATOR 7.5 (GLOVE) ×2
GLOVE SURG SS PI 6.5 STRL IVOR (GLOVE) ×2 IMPLANT
GLOVE SURG SS PI 8.0 STRL IVOR (GLOVE) ×2 IMPLANT
GOWN STRL REUS W/ TWL LRG LVL3 (GOWN DISPOSABLE) IMPLANT
GOWN STRL REUS W/TWL 2XL LVL3 (GOWN DISPOSABLE) ×6 IMPLANT
GOWN STRL REUS W/TWL LRG LVL3 (GOWN DISPOSABLE) ×10
GOWN STRL REUS W/TWL XL LVL3 (GOWN DISPOSABLE) ×4 IMPLANT
GRASPER ENDOPATH ANVIL 10MM (MISCELLANEOUS) IMPLANT
GRASPER SUT TROCAR 14GX15 (MISCELLANEOUS) IMPLANT
HOLDER FOLEY CATH W/STRAP (MISCELLANEOUS) ×2 IMPLANT
IRRIG SUCT STRYKERFLOW 2 WTIP (MISCELLANEOUS) ×2
IRRIGATION SUCT STRKRFLW 2 WTP (MISCELLANEOUS) ×1 IMPLANT
IRRIGATOR SUCT 8 DISP DVNC XI (IRRIGATION / IRRIGATOR) IMPLANT
IRRIGATOR SUCTION 8MM XI DISP (IRRIGATION / IRRIGATOR)
KIT PROCEDURE DA VINCI SI (MISCELLANEOUS)
KIT PROCEDURE DVNC SI (MISCELLANEOUS) ×1 IMPLANT
NDL INSUFFLATION 14GA 120MM (NEEDLE) ×1 IMPLANT
NEEDLE INSUFFLATION 14GA 120MM (NEEDLE) ×2 IMPLANT
PACK CARDIOVASCULAR III (CUSTOM PROCEDURE TRAY) ×2 IMPLANT
PACK COLON (CUSTOM PROCEDURE TRAY) ×2 IMPLANT
PORT LAP GEL ALEXIS MED 5-9CM (MISCELLANEOUS) ×2 IMPLANT
RETRACTOR WND ALEXIS 18 MED (MISCELLANEOUS) IMPLANT
RTRCTR WOUND ALEXIS 18CM MED (MISCELLANEOUS)
SCISSORS LAP 5X35 DISP (ENDOMECHANICALS) ×2 IMPLANT
SEAL CANN UNIV 5-8 DVNC XI (MISCELLANEOUS) ×4 IMPLANT
SEAL XI 5MM-8MM UNIVERSAL (MISCELLANEOUS) ×4
SEALER VESSEL DA VINCI XI (MISCELLANEOUS) ×1
SEALER VESSEL EXT DVNC XI (MISCELLANEOUS) ×1 IMPLANT
SLEEVE ADV FIXATION 5X100MM (TROCAR) IMPLANT
SOLUTION ELECTROLUBE (MISCELLANEOUS) ×2 IMPLANT
SPONGE LAP 18X18 X RAY DECT (DISPOSABLE) ×2 IMPLANT
STAPLER 45 BLU RELOAD XI (STAPLE) IMPLANT
STAPLER 45 BLUE RELOAD XI (STAPLE)
STAPLER 45 GREEN RELOAD XI (STAPLE) ×2
STAPLER 45 GRN RELOAD XI (STAPLE) ×2 IMPLANT
STAPLER CANNULA SEAL DVNC XI (STAPLE) ×1 IMPLANT
STAPLER CANNULA SEAL XI (STAPLE) ×1
STAPLER ECHELON POWER CIR 29 (STAPLE) ×2 IMPLANT
STAPLER SHEATH (SHEATH) ×1
STAPLER SHEATH ENDOWRIST DVNC (SHEATH) ×1 IMPLANT
STAPLER VISISTAT 35W (STAPLE) IMPLANT
SUT ETHILON 2 0 PS N (SUTURE) IMPLANT
SUT MNCRL AB 4-0 PS2 18 (SUTURE) ×2 IMPLANT
SUT NOVA NAB DX-16 0-1 5-0 T12 (SUTURE) ×4 IMPLANT
SUT PROLENE 2 0 KS (SUTURE) ×2 IMPLANT
SUT SILK 2 0 (SUTURE) ×2
SUT SILK 2 0 SH CR/8 (SUTURE) ×2 IMPLANT
SUT SILK 2-0 18XBRD TIE 12 (SUTURE) ×1 IMPLANT
SUT SILK 3 0 (SUTURE) ×2
SUT SILK 3 0 SH CR/8 (SUTURE) ×2 IMPLANT
SUT SILK 3-0 18XBRD TIE 12 (SUTURE) ×1 IMPLANT
SUT V-LOC BARB 180 2/0GR6 GS22 (SUTURE)
SUT VIC AB 2-0 SH 18 (SUTURE) ×2 IMPLANT
SUT VIC AB 2-0 SH 27 (SUTURE) ×2
SUT VIC AB 2-0 SH 27X BRD (SUTURE) ×1 IMPLANT
SUT VIC AB 3-0 SH 18 (SUTURE) ×2 IMPLANT
SUT VIC AB 4-0 PS2 18 (SUTURE) ×4 IMPLANT
SUT VIC AB 4-0 PS2 27 (SUTURE) ×6 IMPLANT
SUT VICRYL 0 UR6 27IN ABS (SUTURE) ×2 IMPLANT
SUTURE V-LC BRB 180 2/0GR6GS22 (SUTURE) IMPLANT
SYR 10ML ECCENTRIC (SYRINGE) ×2 IMPLANT
SYS LAPSCP GELPORT 120MM (MISCELLANEOUS)
SYSTEM LAPSCP GELPORT 120MM (MISCELLANEOUS) IMPLANT
TOWEL OR 17X26 10 PK STRL BLUE (TOWEL DISPOSABLE) IMPLANT
TOWEL OR NON WOVEN STRL DISP B (DISPOSABLE) ×2 IMPLANT
TRAY FOL W/BAG SLVR 16FR STRL (SET/KITS/TRAYS/PACK) ×1 IMPLANT
TRAY FOLEY W/BAG SLVR 16FR LF (SET/KITS/TRAYS/PACK) ×2
TROCAR ADV FIXATION 5X100MM (TROCAR) ×2 IMPLANT
TUBING CONNECTING 10 (TUBING) ×4 IMPLANT
TUBING INSUFFLATION 10FT LAP (TUBING) ×2 IMPLANT

## 2018-07-12 NOTE — Op Note (Signed)
07/12/2018  3:10 PM  PATIENT:  Jorge Peterson  57 y.o. male  Patient Care Team: [name], MD as PCP - General (Family Medicine)  PRE-OPERATIVE DIAGNOSIS:  DIVERTICULAR DISEASE  POST-OPERATIVE DIAGNOSIS:  DIVERTICULAR DISEASE  PROCEDURE:   XI ROBOT ASSISTED LAPAROSCOPIC SIGMOIDECTOMY    Surgeon(s): Leighton Ruff, MD Michael Boston, MD  ASSISTANT: Dr Johney Maine   ANESTHESIA:   local and general  EBL: 95ml Total I/O In: 1300 [I.V.:1200; IV Piggyback:100] Out: 75 [Blood:75]  Delay start of Pharmacological VTE agent (>24hrs) due to surgical blood loss or risk of bleeding:  no  DRAINS: none   SPECIMEN:  Source of Specimen:  Sigmoid colon  DISPOSITION OF SPECIMEN:  PATHOLOGY  COUNTS:  YES  PLAN OF CARE: Admit to inpatient   PATIENT DISPOSITION:  PACU - hemodynamically stable.  INDICATION:    57 y.o. M with multiple recurrences of diverticulitis.  I recommended segmental resection:  The anatomy & physiology of the digestive tract was discussed.  The pathophysiology was discussed.  Natural history risks without surgery was discussed.   I worked to give an overview of the disease and the frequent need to have multispecialty involvement.  I feel the risks of no intervention will lead to serious problems that outweigh the operative risks; therefore, I recommended a partial colectomy to remove the pathology.  Laparoscopic & open techniques were discussed.   Risks such as bleeding, infection, abscess, leak, reoperation, possible ostomy, hernia, heart attack, death, and other risks were discussed.  I noted a good likelihood this will help address the problem.   Goals of post-operative recovery were discussed as well.    The patient expressed understanding & wished to proceed with surgery.  OR FINDINGS:   Patient had sigmoid diverticular disease  The anastomosis rests 15 cm from the anal verge by rigid proctoscopy.  DESCRIPTION:   Informed consent was confirmed.  The  patient underwent general anaesthesia without difficulty.  The patient was positioned appropriately.  VTE prevention in place.  The patient's abdomen was clipped, prepped, & draped in a sterile fashion.  Surgical timeout confirmed our plan.  The patient was positioned in reverse Trendelenburg.  Abdominal entry was gained using a Varies needle in the LUQ.  Entry was clean.  I induced carbon dioxide insufflation.  An 16mm robotic port was placed in the RUQ.  Camera inspection revealed no injury.  Extra ports were carefully placed under direct laparoscopic visualization.  I laparoscopically reflected the greater omentum and the upper abdomen the small bowel in the upper abdomen. The patient was appropriately positioned and the robot was docked to the patient's left side.  Instruments were placed under direct visualization.    I mobilized the sigmoid colon off of the pelvic sidewall.  I scored the base of peritoneum of the right side of the mesentery of the left colon from the ligament of Treitz to the peritoneal reflection of the mid rectum.  I elevated the sigmoid mesentery and enetered into the retro-mesenteric plane. We were able to identify the left ureter and gonadal vessels. We kept those posterior within the retroperitoneum and elevated the left colon mesentery off that. I did isolated IMA pedicle but did not ligate it yet.  I continued distally and got into the avascular plane posterior to the mesorectum. This allowed me to help mobilize the rectum as well by freeing the mesorectum off the sacrum.  I mobilized the peritoneal coverings towards the peritoneal reflection on both the right and left sides  of the rectum.  I could see the right and left ureters and stayed away from them.    I skeletonized the inferior mesenteric artery pedicle.  After confirming the left ureter was out of the way, I went ahead and ligated the inferior mesenteric artery pedicle with bipolar robotic vessel sealer well above its  takeoff from the aorta.  We ensured hemostasis. I skeletonized the mesorectum at the junction at the proximal rectum using blunt dissection & bipolar robotic vessel sealer.  I mobilized the left colon in a lateral to medial fashion off the line of Toldt up towards the splenic flexure to ensure good mobilization of the left colon to reach into the pelvis.  I then divided the rectosigmoid junction using 2 green load robotic staplers.  The abdomen was then desufflated.  A Pfannenstiel incision was made suprapubically.  An [place] wound protector was placed.  The colon was brought out through this area and the proximal end was transected over a 2-0 Prolene pursestring device.  A 29 mm EEA anvil was then placed into the colon and the pearly suture was tied tightly around this.  I used a 3-0 silk suture to bring a small serosal tear into the area of transection.  This was then placed back into the abdomen and the abdomen was reinsufflated.  An anastomosis was created through the distal rectal stump using the EEA stapler.  There was no tension noted.  There was no leak when tested with insufflation under irrigation.  Hemostasis was good.  We then removed the insufflation.  We switched to clean gowns, gloves, instruments and drapes.  The peritoneum of the Pfannenstiel incision was closed using a running 2-0 Vicryl suture.  The fascia was closed using interrupted #1 Novafil sutures.  The subcutaneous tissue was reapproximated using interrupted 2-0 Vicryl sutures and the skin was closed with a running 4-0 Vicryl subcuticular suture.  A sterile dressing was applied.  The remaining port sites were closed using interrupted 4-0 Vicryl sutures and Dermabond.  Patient tolerated this well was sent to the postanesthesia care unit stable condition.  All counts were correct per operating room staff.  An MD assistant was necessary for tissue manipulation, retraction and positioning due to the complexity of the case and hospital  policies

## 2018-07-12 NOTE — Transfer of Care (Signed)
Immediate Anesthesia Transfer of Care Note  Patient: Jorge Peterson  Procedure(s) Performed: XI ROBOT ASSISTED LAPAROSCOPIC PARTIAL COLECTOMY (N/A )  Patient Location: PACU  Anesthesia Type:General  Level of Consciousness: awake, alert , oriented and patient cooperative  Airway & Oxygen Therapy: Patient Spontanous Breathing and Patient connected to face mask oxygen  Post-op Assessment: Report given to RN, Post -op Vital signs reviewed and stable and Patient moving all extremities X 4  Post vital signs: Reviewed and stable  Last Vitals:  Vitals Value Taken Time  BP 164/101 07/12/2018  3:02 PM  Temp    Pulse 59 07/12/2018  3:05 PM  Resp 11 07/12/2018  3:05 PM  SpO2 96 % 07/12/2018  3:05 PM  Vitals shown include unvalidated device data.  Last Pain:  Vitals:   07/12/18 1015  TempSrc: Oral         Complications: No apparent anesthesia complications

## 2018-07-12 NOTE — Anesthesia Procedure Notes (Signed)
Procedure Name: Intubation Date/Time: 07/12/2018 12:37 PM Performed by: Mitzie Na, CRNA Pre-anesthesia Checklist: Patient identified, Emergency Drugs available, Suction available, Patient being monitored and Timeout performed Patient Re-evaluated:Patient Re-evaluated prior to induction Oxygen Delivery Method: Circle system utilized Preoxygenation: Pre-oxygenation with 100% oxygen Induction Type: IV induction Ventilation: Mask ventilation without difficulty and Oral airway inserted - appropriate to patient size Laryngoscope Size: Miller and 3 Grade View: Grade I Tube type: Oral Tube size: 7.5 mm Number of attempts: 1 Airway Equipment and Method: Stylet Placement Confirmation: ETT inserted through vocal cords under direct vision,  positive ETCO2 and breath sounds checked- equal and bilateral Secured at: 25 cm Tube secured with: Tape Dental Injury: Teeth and Oropharynx as per pre-operative assessment

## 2018-07-12 NOTE — Anesthesia Postprocedure Evaluation (Signed)
Anesthesia Post Note  Patient: Jorge Peterson  Procedure(s) Performed: XI ROBOT ASSISTED LAPAROSCOPIC PARTIAL COLECTOMY (N/A )     Patient location during evaluation: PACU Anesthesia Type: General Level of consciousness: awake and alert Pain management: pain level controlled Vital Signs Assessment: post-procedure vital signs reviewed and stable Respiratory status: spontaneous breathing, nonlabored ventilation, respiratory function stable and patient connected to nasal cannula oxygen Cardiovascular status: blood pressure returned to baseline and stable Postop Assessment: no apparent nausea or vomiting Anesthetic complications: no    Last Vitals:  Vitals:   07/12/18 1630 07/12/18 1654  BP: (!) 152/83 (!) 179/98  Pulse: 61 70  Resp: 12 15  Temp: 36.8 C 36.8 C  SpO2: 93% 95%    Last Pain:  Vitals:   07/12/18 1600  TempSrc:   PainSc: [place]

## 2018-07-12 NOTE — Telephone Encounter (Signed)
  Follow up Call-  Call back number 07/11/2018 05/04/2016  Post procedure Call Back phone  # [phone]  Permission to leave phone message Yes Yes  Some recent data might be hidden     Patient questions:  Do you have a fever, pain , or abdominal swelling? No. Pain Score  0 *  Have you tolerated food without any problems? Yes.    Have you been able to return to your normal activities? Yes.    Do you have any questions about your discharge instructions: Diet   No. Medications  No. Follow up visit  No.  Do you have questions or concerns about your Care? No.  Actions: * If pain score is 4 or above: No action needed, pain <4.

## 2018-07-12 NOTE — H&P (Signed)
The patient is a 57 year old male who presents with diverticulitis. 57 year old male who presents to the office for hospital follow-up after an episode of focally perforated diverticulitis. He was discharged proximally 3 weeks ago. Since that time he has had minimal pain and is tolerating a diet without difficulty. He does complain of some fatigue. His last colonoscopy was approximately 2 years ago. Past medical history significant for paroxysmal atrial fibrillation and diabetes. He does see a cardiologist regularly and is not currently anticoagulated as he is in normal sinus rhythm. Past surgical history significant for lap Cholecystectomy. In the past he has had multiple episodes of diverticulitis which would've been treated with antibiotics only. He has had 3 episodes of diverticulitis this year.   Problem List/Past Medical Leighton Ruff, MD; 5/00/9381 2:56 PM) S/P LAPAROSCOPIC CHOLECYSTECTOMY (Z90.49) DIVERTICULAR DISEASE OF COLON (K57.30)  Past Surgical History Leighton Ruff, MD; 04/27/9370 2:56 PM) Anal Fissure Repair Colon Polyp Removal - Colonoscopy Gallbladder Surgery - Laparoscopic Spinal Surgery - Lower Back  Diagnostic Studies History Leighton Ruff, MD; 6/96/7893 2:56 PM) Colonoscopy 1-5 years ago  Allergies (Tanisha A. Owens Shark, [place]; 05/29/2018 2:25 PM) Oxycodone-Acetaminophen *ANALGESICS - OPIOID* Allergies Reconciled  Medication History Leighton Ruff, MD; 04/08/1750 2:56 PM) metFORMIN HCl ER (500MG  Tablet ER 24HR, Oral) Active. Flonase (50MCG/ACT Suspension, Nasal) Active. Folic Acid (025ENI Tablet, Oral) Active. Hydrochlorothiazide (12.5MG  Capsule, Oral) Active. Multiple Vitamin (Oral) Active. PriLOSEC (40MG  Capsule DR, Oral) Active. Probiotic (Oral) Active. Gas-X Prevention (Oral) Active. Zocor (40MG  Tablet, Oral) Active. Simvastatin (20MG  Tablet, Oral) Active. Flecainide Acetate (50MG  Tablet, Oral) Active.  Social History Leighton Ruff, MD; 7/78/2423 2:56 PM) Caffeine use Carbonated beverages, Coffee, Tea. No alcohol use Tobacco use Never smoker.  Family History Leighton Ruff, MD; 5/36/1443 2:56 PM) Diabetes Mellitus Father, Sister. Heart Disease Father. Hypertension Father, Sister. Respiratory Condition Father.  Other Problems Leighton Ruff, MD; 1/54/0086 2:56 PM) Atrial Fibrillation Cholelithiasis Diverticulosis High blood pressure Hypercholesterolemia     Review of Systems General Present- Appetite Loss, Fatigue and Weight Loss. Not Present- Chills, Fever, Night Sweats and Weight Gain. Skin Not Present- Change in Wart/Mole, Dryness, Hives, Jaundice, New Lesions, Non-Healing Wounds, Rash and Ulcer. HEENT Not Present- Earache, Hearing Loss, Hoarseness, Nose Bleed, Oral Ulcers, Ringing in the Ears, Seasonal Allergies, Sinus Pain, Sore Throat, Visual Disturbances, Wears glasses/contact lenses and Yellow Eyes. Respiratory Not Present- Bloody sputum, Chronic Cough, Difficulty Breathing, Snoring and Wheezing. Breast Not Present- Breast Mass, Breast Pain, Nipple Discharge and Skin Changes. Cardiovascular Not Present- Chest Pain, Difficulty Breathing Lying Down, Leg Cramps, Palpitations, Rapid Heart Rate, Shortness of Breath and Swelling of Extremities. Gastrointestinal Present- Bloating, Constipation and Excessive gas. Not Present- Abdominal Pain, Bloody Stool, Change in Bowel Habits, Chronic diarrhea, Difficulty Swallowing, Gets full quickly at meals, Hemorrhoids, Indigestion, Nausea, Rectal Pain and Vomiting. Male Genitourinary Not Present- Blood in Urine, Change in Urinary Stream, Frequency, Impotence, Nocturia, Painful Urination, Urgency and Urine Leakage. Musculoskeletal Not Present- Back Pain, Joint Pain, Joint Stiffness, Muscle Pain, Muscle Weakness and Swelling of Extremities. Neurological Not Present- Decreased Memory, Fainting, Headaches, Numbness, Seizures, Tingling, Tremor, Trouble  walking and Weakness. Psychiatric Not Present- Anxiety, Bipolar, Change in Sleep Pattern, Depression, Fearful and Frequent crying. Endocrine Not Present- Cold Intolerance, Excessive Hunger, Hair Changes, Heat Intolerance and New Diabetes. Hematology Not Present- Easy Bruising, Excessive bleeding, Gland problems, HIV and Persistent Infections.  BP 134/82   Pulse (!) 57   Temp 98.3 F (36.8 C) (Oral)   Resp 16   SpO2 96%  Physical Exam Leighton Ruff MD; 3/83/3383 2:56 PM)  General Mental Status-Alert. General Appearance-Not in acute distress. Build & Nutrition-Well nourished. Posture-Normal posture. Gait-Normal.  Head and Neck Head-normocephalic, atraumatic with no lesions or palpable masses. Trachea-midline.  Chest and Lung Exam Chest and lung exam reveals -on auscultation, normal breath sounds, no adventitious sounds and normal vocal resonance.  Cardiovascular Cardiovascular examination reveals -normal heart sounds, regular rate and rhythm with no murmurs and no digital clubbing, cyanosis, edema, increased warmth or tenderness.  Abdomen Inspection Inspection of the abdomen reveals - No Hernias. Palpation/Percussion Palpation and Percussion of the abdomen reveal - Soft, Non Tender, No Rigidity (guarding), No hepatosplenomegaly and No Palpable abdominal masses.  Neurologic Neurologic evaluation reveals -alert and oriented x 3 with no impairment of recent or remote memory, normal attention span and ability to concentrate, normal sensation and normal coordination.  Musculoskeletal Normal Exam - Bilateral-Upper Extremity Strength Normal and Lower Extremity Strength Normal.    Assessment & Plan =  DIVERTICULAR DISEASE OF COLON (K57.30) Impression: 57 year old male who presents to the office for evaluation of diverticular disease. He was hospitalized in early September for diverticulitis with abscess which resolved with IV  antibiotics. Colonoscopy yesterday was normal.  We discussed that given his recurrent episodes of diverticulitis as well as his recent perforation he would be a good candidate for colectomy. We have discussed this in detail including risk and benefits. All questions were answered. I do think he would benefit from a flexible sigmoidoscopy to make sure that there is no tumor located within the sigmoid colon. We will also get cardiac clearance prior to the OR.  The surgery and anatomy were described to the patient as well as the risks of surgery and the possible complications. These include: Bleeding, deep abdominal infections and possible wound complications such as hernia and infection, damage to adjacent structures, leak of surgical connections, which can lead to other surgeries and possibly an ostomy, possible need for other procedures, such as abscess drains in radiology, possible prolonged hospital stay, possible diarrhea from removal of part of the colon, possible constipation from narcotics, possible bowel, bladder or sexual dysfunction if having rectal surgery, prolonged fatigue/weakness or appetite loss, possible early recurrence of of disease, possible complications of their medical problems such as heart disease or arrhythmias or lung problems, death (less than 1%). I believe the patient understands and wishes to proceed with the surgery.

## 2018-07-12 NOTE — Progress Notes (Signed)
Patient started on clear liquids tolerating well. Incentive spirometer was given and patient reaching @ 1500. Will continue to monitor.

## 2018-07-13 LAB — BASIC METABOLIC PANEL WITH GFR
Anion gap: 9 (ref 5–15)
BUN: 11 mg/dL (ref 6–20)
CO2: 25 mmol/L (ref 22–32)
Calcium: 8.8 mg/dL — ABNORMAL LOW (ref 8.9–10.3)
Chloride: 104 mmol/L (ref 98–111)
Creatinine, Ser: 1.23 mg/dL (ref 0.61–1.24)
GFR calc Af Amer: >60 mL/min
GFR calc non Af Amer: >60 mL/min
Glucose, Bld: 180 mg/dL — ABNORMAL HIGH (ref 70–99)
Potassium: 4 mmol/L (ref 3.5–5.1)
Sodium: 138 mmol/L (ref 135–145)

## 2018-07-13 LAB — CBC
HCT: 41 % (ref 39.0–52.0)
Hemoglobin: 13.4 g/dL (ref 13.0–17.0)
MCH: 27.5 pg (ref 26.0–34.0)
MCHC: 32.7 g/dL (ref 30.0–36.0)
MCV: 84.2 fL (ref 80.0–100.0)
Platelets: 211 10*3/uL (ref 150–400)
RBC: 4.87 MIL/uL (ref 4.22–5.81)
RDW: 12.8 % (ref 11.5–15.5)
WBC: 11.3 10*3/uL — ABNORMAL HIGH (ref 4.0–10.5)
nRBC: 0 % (ref 0.0–0.2)

## 2018-07-13 NOTE — Progress Notes (Signed)
1 Day Post-Op robotic sigmoidectomy Subjective: Pt doing well.  Ambulating.  Pain controlled.  Passing flatus Objective: Vital signs in last 24 hours: Temp:  [97.7 F (36.5 C)-99.3 F (37.4 C)] 98.3 F (36.8 C) (11/14 0900) Pulse Rate:  [54-70] 54 (11/14 0900) Resp:  [10-20] 16 (11/14 0900) BP: (102-179)/(67-101) 124/77 (11/14 0900) SpO2:  [92 %-98 %] 95 % (11/14 0900)   Intake/Output from previous day: 11/13 0701 - 11/14 0700 In: 2863.8 [P.O.:480; I.V.:2283.8; IV Piggyback:100] Out: 775 [Urine:700; Blood:75] Intake/Output this shift: Total I/O In: 450 [P.O.:450] Out: 1200 [Urine:1200]   General appearance: alert and cooperative GI: normal findings: soft, non-tender  Incision: no significant drainage  Lab Results:  Recent Labs    07/13/18 0335  WBC 11.3*  HGB 13.4  HCT 41.0  PLT 211   BMET Recent Labs    07/13/18 0335  NA 138  K 4.0  CL 104  CO2 25  GLUCOSE 180*  BUN 11  CREATININE 1.23  CALCIUM 8.8*   PT/INR No results for input(s): LABPROT, INR in the last 72 hours. ABG No results for input(s): PHART, HCO3 in the last 72 hours.  Invalid input(s): PCO2, PO2  MEDS, Scheduled . acetaminophen  1,000 mg Oral Q6H  . alvimopan  12 mg Oral BID  . enoxaparin (LOVENOX) injection  40 mg Subcutaneous Q24H  . feeding supplement  237 mL Oral BID BM  . flecainide  50 mg Oral BID  . gabapentin  300 mg Oral BID  . hydrochlorothiazide  12.5 mg Oral Daily  . losartan  100 mg Oral Q2200  . metFORMIN  500 mg Oral q1800  . metoprolol succinate  12.5 mg Oral Q2200  . pantoprazole  80 mg Oral Daily  . saccharomyces boulardii  250 mg Oral BID  . simvastatin  20 mg Oral Q2200    Studies/Results: No results found.  Assessment: s/p Procedure(s): XI ROBOT ASSISTED LAPAROSCOPIC PARTIAL COLECTOMY Patient Active Problem List   Diagnosis Date Noted  . Diverticular disease 07/12/2018  . Colonic diverticular abscess 05/11/2018  . Quadriceps tendon rupture  01/13/2016  . Overweight 07/14/2015  . Nonspecific (abnormal) findings on radiological and other examination of biliary tract 05/25/2014  . Common bile duct (CBD) obstruction 05/24/2014  . Choledocholithiasis 05/24/2014  . Acute cholecystitis 05/02/2014  . Hx of adenomatous colonic polyps 04/12/2013  . Solitary pulmonary nodule 02/09/2013  . H/O lymph node biopsy 01/24/2013  . Borderline diabetes 11/27/2012  . Abnormal CT of the chest   11/26/2012  . History of adenomatous polyp of colon 11/05/2011  . Sigmoid diverticulitis 10/23/2011  . Leukocytosis 10/23/2011  . HLD (hyperlipidemia) 10/23/2011  . HTN (hypertension) 10/23/2011  . Bradycardia 10/07/2010  . Atrial fibrillation ([place]) 12/03/2009    Expected post op course  Plan: d/c foley Advance diet to soft foods SL IVF's Ambulate   LOS: 1 day     .Rosario Adie, [facility], [place]   07/13/2018 11:06 AM

## 2018-07-13 NOTE — Progress Notes (Signed)
I have reviewed and concur with this student's documentation.   

## 2018-07-14 ENCOUNTER — Other Ambulatory Visit: Payer: Self-pay | Admitting: Internal Medicine

## 2018-07-14 LAB — BASIC METABOLIC PANEL
ANION GAP: 7 (ref 5–15)
BUN: 17 mg/dL (ref 6–20)
CO2: 30 mmol/L (ref 22–32)
Calcium: 8.9 mg/dL (ref 8.9–10.3)
Chloride: 104 mmol/L (ref 98–111)
Creatinine, Ser: 1.13 mg/dL (ref 0.61–1.24)
GFR calc non Af Amer: >60 mL/min (ref >60)
Glucose, Bld: 129 mg/dL — ABNORMAL HIGH (ref 70–99)
POTASSIUM: 3.9 mmol/L (ref 3.5–5.1)
SODIUM: 141 mmol/L (ref 135–145)

## 2018-07-14 LAB — CBC
HCT: 40 % (ref 39.0–52.0)
HEMOGLOBIN: 12.9 g/dL — AB (ref 13.0–17.0)
MCH: 28 pg (ref 26.0–34.0)
MCHC: 32.3 g/dL (ref 30.0–36.0)
MCV: 86.8 fL (ref 80.0–100.0)
NRBC: 0 % (ref 0.0–0.2)
Platelets: 182 10*3/uL (ref 150–400)
RBC: 4.61 MIL/uL (ref 4.22–5.81)
RDW: 13.2 % (ref 11.5–15.5)
WBC: 11.2 10*3/uL — AB (ref 4.0–10.5)

## 2018-07-14 MED ORDER — ACETAMINOPHEN 500 MG PO TABS: 1000.0000 mg | ORAL_TABLET | Freq: Four times a day (QID) | ORAL | 0 refills | Status: AC | PRN

## 2018-07-14 MED ORDER — TRAMADOL HCL 50 MG PO TABS: 50.0000 mg | ORAL_TABLET | Freq: Four times a day (QID) | ORAL | 0 refills | Status: DC | PRN

## 2018-07-14 NOTE — Discharge Instructions (Signed)

## 2018-07-14 NOTE — Discharge Summary (Signed)
Physician Discharge Summary  Patient ID: Jorge Peterson MRN: [id number] DOB/AGE: [birth year] 57 y.o.  Admit date: 07/12/2018 Discharge date: 07/14/2018  Admission Diagnoses: diverticular disease  Discharge Diagnoses:  Active Problems:   Diverticular disease   Discharged Condition: good  Hospital Course: Pt admitted after robotic sigmoidectomy.  His diet was advanced as tolerated.  By POD 2 he was having bowel function, his pain was controlled and he was ambulating well.  He was felt to stable to discharge.  Consults: None  Significant Diagnostic Studies: labs: cbc, bmet  Treatments: IV hydration, analgesia: acetaminophen and surgery: see above  Discharge Exam: Blood pressure 107/72, pulse (!) 45, temperature 97.9 F (36.6 C), temperature source Oral, resp. rate 20, height 5\' 7"  (1.702 m), weight 98.9 kg, SpO2 92 %. General appearance: alert and cooperative GI: normal findings: soft, non-tender Incision/Wound: clean, dry, intact  Disposition: home   Allergies as of 07/14/2018      Reactions   Codeine Itching, Rash   Latex Itching, Rash   Oxycodone-acetaminophen Itching, Rash      Medication List    TAKE these medications   acetaminophen 500 MG tablet Commonly known as:  TYLENOL Take 2 tablets (1,000 mg total) by mouth every 6 (six) hours as needed.   BIOTIN PO Take 7,500 mcg by mouth daily.   COQ10 150 MG Caps Take 1 capsule by mouth daily at 10 pm.   Fish Oil 1200 MG Caps Take 1,200-2,400 mg by mouth See admin instructions. Take 2 capsules (2400 mg) by mouth in the morning & take 1 capsule (1200 mg) by mouth at night.   flecainide 50 MG tablet Commonly known as:  TAMBOCOR TAKE 1 TABLET BY MOUTH TWICE A DAY   fluticasone 50 MCG/ACT nasal spray Commonly known as:  FLONASE Place 1 spray into the nose daily as needed for allergies.   folic acid 038 MCG tablet Commonly known as:  FOLVITE Take 400 mcg by mouth daily at 6 (six) AM.   hydrochlorothiazide  12.5 MG tablet Commonly known as:  HYDRODIURIL Take 12.5 mg by mouth daily.   losartan 100 MG tablet Commonly known as:  COZAAR Take 100 mg by mouth daily at 10 pm.   metFORMIN 500 MG 24 hr tablet Commonly known as:  GLUCOPHAGE-XR Take 500 mg by mouth daily at 6 PM.   metoprolol succinate 25 MG 24 hr tablet Commonly known as:  TOPROL-XL Take 0.5 tablets (12.5 mg total) by mouth daily. What changed:  when to take this   multivitamin with minerals Tabs tablet Take 1 tablet by mouth daily. Centrum Silver   omeprazole 40 MG capsule Commonly known as:  PRILOSEC Take 40 mg by mouth daily at 6 (six) AM.   PROBIOTIC PO Take 1 capsule by mouth 2 (two) times daily.   simvastatin 20 MG tablet Commonly known as:  ZOCOR TAKE 1 TABLET BY MOUTH EVERYDAY AT BEDTIME What changed:  See the new instructions.   traMADol 50 MG tablet Commonly known as:  ULTRAM Take 1 tablet (50 mg total) by mouth every 6 (six) hours as needed (mild pain).      Follow-up Information    Leighton Ruff, MD. Schedule an appointment as soon as possible for a visit in 2 week(s).   Specialty:  General Surgery Contact information: [address] [place] [address] [phone]           Signed: Rosario Adie 59/97/9480, 7:48 AM

## 2018-07-18 ENCOUNTER — Encounter: Payer: Self-pay | Admitting: Internal Medicine

## 2018-07-31 ENCOUNTER — Ambulatory Visit: Payer: 59 | Admitting: Internal Medicine

## 2018-07-31 ENCOUNTER — Encounter: Payer: Self-pay | Admitting: Internal Medicine

## 2018-07-31 VITALS — BP 122/76 | HR 52 | Ht 67.0 in | Wt 220.8 lb

## 2018-07-31 DIAGNOSIS — I480 Paroxysmal atrial fibrillation: Secondary | ICD-10-CM | POA: Diagnosis not present

## 2018-07-31 LAB — CUP PACEART INCLINIC DEVICE CHECK
Date Time Interrogation Session: 20191202174733
MDC IDC PG IMPLANT DT: 20161028

## 2018-07-31 MED ORDER — APIXABAN 5 MG PO TABS: 5.0000 mg | ORAL_TABLET | Freq: Two times a day (BID) | ORAL | 6 refills | Status: DC

## 2018-07-31 NOTE — Patient Instructions (Addendum)
Medication Instructions:  Your physician has recommended you make the following change in your medication:  1. START Eliquis 5 mg twice daily -- YOU WILL START THIS ON 08/13/18  *If you need a refill on your cardiac medications before your next appointment, please call your pharmacy*  Labwork: None ordered  Testing/Procedures: None ordered  Follow-Up: Remote monitoring is used to monitor your loop recorder. You are scheduled for a device check from home on 08/08/2018.   Your physician wants you to follow-up in: 6 months with [place], [place] (Clint Fenton) in the AFib clinic.  You will receive a reminder letter in the mail two months in advance. If you don't receive a letter, please call our office to schedule the follow-up appointment.  Thank you for choosing [facility]!!    Any Other Special Instructions Will Be Listed Below (If Applicable).

## 2018-07-31 NOTE — Progress Notes (Signed)
Electrophysiology Office Note Date: 07/31/2018  ID:  Jorge Peterson, DOB 1961/01/05, MRN [id number]  PCP: [name], MD  Electrophysiologist: Dr Rayann Heman  CC: Follow up for atrial fibrillation  Jorge Peterson is a 57 y.o. male seen today for for routine electrophysiology followup.  Since last being seen in our clinic, the patient reports doing very well. Two weeks ago, he had colon surgery for his recurrent diverticulitis and he is recovering well. He is unaware of any atrial fibrillation or post-termination pauses.  He denies chest pain, palpitations, dyspnea, PND, orthopnea, nausea, vomiting, dizziness, syncope, edema, weight gain, or early satiety.  Past Medical History:  Diagnosis Date  . Atrial fibrillation ([place])    s/p afib ablation 7/12  . Bradycardia    asymptomatic  . Colonic diverticular abscess   . Diabetes ([place])   . Diverticulitis   . Dysrhythmia   . Elevated LFTs   . Fall   . Family history of anesthesia complication    father gets pneumonia after "  . GERD (gastroesophageal reflux disease)   . Hyperlipidemia   . Hyperplastic colon polyp   . Hypertension   . Internal hemorrhoids   . Tinnitus   . Tubular adenoma of colon   . Wears glasses    Past Surgical History:  Procedure Laterality Date  . ATRIAL ABLATION SURGERY  03/24/11   PVI by Greggory Brandy  . BACK SURGERY    . BRONCHOSCOPY  01/23/2013    biopsy  . CHOLECYSTECTOMY  05/02/2014   LAP CHOLE  . CHOLECYSTECTOMY N/A 05/02/2014   Procedure: LAPAROSCOPIC CHOLECYSTECTOMY WITH INTRAOPERATIVE CHOLANGIOGRAM;  Surgeon: Erroll Luna, MD;  Location: [facility];  Service: General;  Laterality: N/A;  . EP IMPLANTABLE DEVICE N/A 06/27/2015   Procedure: Loop Recorder Insertion;  Surgeon: [name], MD;  Location: [facility];  Service: Cardiovascular;  Laterality: N/A;  . ERCP N/A 05/25/2014   Procedure: ENDOSCOPIC RETROGRADE CHOLANGIOPANCREATOGRAPHY (ERCP);  Surgeon: [name], MD;  Location: [facility];  Service: Endoscopy;  Laterality: N/A;  . Fistula surgery    . MEDIASTINOSCOPY N/A 01/23/2013   Procedure: MEDIASTINOSCOPY;  Surgeon: Ivin Poot, MD;  Location: [facility];  Service: Thoracic;  Laterality: N/A;  . QUADRICEPS TENDON REPAIR Right 01/13/2016   Procedure: REPAIR QUADRICEP TENDON;  Surgeon: Latanya Maudlin, MD;  Location: [facility];  Service: Orthopedics;  Laterality: Right;  Received femoral nerve block in holding  . SHOULDER SURGERY     x 2  . TONSILLECTOMY    . VIDEO BRONCHOSCOPY WITH ENDOBRONCHIAL ULTRASOUND N/A 01/23/2013   Procedure: VIDEO BRONCHOSCOPY WITH ENDOBRONCHIAL ULTRASOUND;  Surgeon: Ivin Poot, MD;  Location: [facility];  Service: Thoracic;  Laterality: N/A;    Current Outpatient Medications  Medication Sig Dispense Refill  . acetaminophen (TYLENOL) 500 MG tablet Take 2 tablets (1,000 mg total) by mouth every 6 (six) hours as needed. 30 tablet 0  . BIOTIN PO Take 7,500 mcg by mouth daily.    . Coenzyme Q10 (COQ10) 150 MG CAPS Take 1 capsule by mouth daily at 10 pm.     . flecainide (TAMBOCOR) 50 MG tablet TAKE 1 TABLET BY MOUTH TWICE A DAY (Patient taking differently: Take 50 mg by mouth 2 (two) times daily. ) 180 tablet 3  . fluticasone (FLONASE) 50 MCG/ACT nasal spray Place 1 spray into the nose daily as needed for allergies.     . folic acid (FOLVITE) 782 MCG tablet Take 400 mcg by mouth daily  at 6 (six) AM.     . hydrochlorothiazide (HYDRODIURIL) 12.5 MG tablet Take 12.5 mg by mouth daily.  3  . losartan (COZAAR) 100 MG tablet Take 100 mg by mouth daily at 10 pm.  3  . metFORMIN (GLUCOPHAGE-XR) 500 MG 24 hr tablet Take 500 mg by mouth daily at 6 PM.     . metoprolol succinate (TOPROL-XL) 25 MG 24 hr tablet Take 0.5 tablets (12.5 mg total) by mouth daily. (Patient taking differently: Take 12.5 mg by mouth daily at 10 pm. ) 45 tablet 3  . Multiple Vitamin (MULTIVITAMIN WITH MINERALS) TABS tablet Take 1 tablet by mouth daily. Centrum Silver    . Omega-3  Fatty Acids (FISH OIL) 1200 MG CAPS Take 1,200-2,400 mg by mouth See admin instructions. Take 2 capsules (2400 mg) by mouth in the morning & take 1 capsule (1200 mg) by mouth at night.    [name] omeprazole (PRILOSEC) 40 MG capsule Take 40 mg by mouth daily at 6 (six) AM.   2  . Probiotic Product (PROBIOTIC PO) Take 1 capsule by mouth 2 (two) times daily.    . simvastatin (ZOCOR) 20 MG tablet TAKE 1 TABLET BY MOUTH EVERYDAY AT BEDTIME (Patient taking differently: Take 20 mg by mouth daily at 10 pm. ) 90 tablet 3  . traMADol (ULTRAM) 50 MG tablet Take 1 tablet (50 mg total) by mouth every 6 (six) hours as needed (mild pain). 20 tablet 0  . apixaban (ELIQUIS) 5 MG TABS tablet Take 1 tablet (5 mg total) by mouth 2 (two) times daily. 60 tablet 6   No current facility-administered medications for this visit.     Allergies:   Codeine; Latex; and Oxycodone-acetaminophen   Social History: Social History   Socioeconomic History  . Marital status: Married    Spouse name: Jorge Peterson  . Number of children: 3  . Years of education: 16  . Highest education level: Bachelor's degree (e.g., BA, AB, BS)  Occupational History  . 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 Financial resource strain: Not hard at all  . Food insecurity:    Worry: Never true    Inability: Never true  . Transportation needs:    Medical: No    Non-medical: No  Tobacco Use  . Smoking status: Never Smoker  . Smokeless tobacco: Never Used  Substance and Sexual Activity  . Alcohol use: No  . Drug use: No  . Sexual activity: Yes  Lifestyle  . Physical activity:    Days per week: 4 days    Minutes per session: 120 min  . Stress: Not at all  Relationships  . Social connections:    Talks on phone: More than three times a week    Gets together: More than three times a week    Attends religious service: More than 4 times per year    Active member of club or organization: No    Attends  meetings of clubs or organizations: Never    Relationship status: Married  . Intimate partner violence:    Fear of current or ex partner: No    Emotionally abused: No    Physically abused: No    Forced sexual activity: No  Other Topics Concern  . Not on file  Social History Narrative   Lives in [place].  3 children are all healthy.   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   No siblings with CAD.  Family History: Family History  Problem Relation Age of Onset  . Liver disease Mother        fatty liver  . Diabetes Father   . Heart disease Father        CABG at 49    Review of Systems: All other systems reviewed and are otherwise negative except as noted above.   Physical Exam: VS:  BP 122/76   Pulse (!) 52   Ht 5\' 7"  (1.702 m)   Wt 220 lb 12.8 oz (100.2 kg)   SpO2 96%   BMI 34.58 kg/m  , BMI Body mass index is 34.58 kg/m. Wt Readings from Last 3 Encounters:  07/31/18 220 lb 12.8 oz (100.2 kg)  07/12/18 218 lb (98.9 kg)  07/11/18 218 lb (98.9 kg)    GEN- The patient is well appearing, alert and oriented x 3 today.   HEENT: normocephalic, atraumatic; sclera clear, conjunctiva pink; hearing intact; oropharynx clear; neck supple, no JVP Lymph- no cervical lymphadenopathy Lungs- Clear to ausculation bilaterally, normal work of breathing.  No wheezes, rales, rhonchi Heart- Regular rate and rhythm, no murmurs, rubs or gallops Extremities- no clubbing, cyanosis, or edema MS- no significant deformity or atrophy Skin- warm and dry, no rash or lesion  Psych- euthymic mood, full affect Neuro- strength and sensation are intact   EKG:  EKG is ordered today. The ekg ordered today shows sinus bradycardia HR 52, 1st degree AV block, PR 234, QRS 94, QTc 392  Recent Labs: 05/12/2018: ALT 20 05/14/2018: Magnesium 2.1 07/14/2018: BUN 17; Creatinine, Ser 1.13; Hemoglobin 12.9; Platelets 182; Potassium 3.9; Sodium 141    Assessment and Plan:  1. Paroxsymal  atrial fibrillation CHADS2VASC score of 2 (HTN, DM) Will start Eliquis 5mg  BID in two weeks to allow him sufficeient time to recover from surgery. Continue Flecainide and metoprolol.  IRL reviewed at length today Afib burden increased per report. Possibly due to GI infections and surgery. Hold on changes for now.  2. HTN Stable, no changes today  3. Bradycardia/Pauses Asymptomatic  Carelink  Current medicines are reviewed at length with the patient today.   The patient does not have concerns regarding his medicines.  The following changes were made today:  Start Eliquis 5mg  BID  Disposition:   Follow up with Afib clinic in 6 months.  Hal Neer PA-C 07/31/2018 4:15 PM   [address] [place] [address] [phone] (office) [phone] (fax)

## 2018-08-08 ENCOUNTER — Ambulatory Visit (INDEPENDENT_AMBULATORY_CARE_PROVIDER_SITE_OTHER): Payer: 59

## 2018-08-08 DIAGNOSIS — R002 Palpitations: Secondary | ICD-10-CM | POA: Diagnosis not present

## 2018-08-09 NOTE — Progress Notes (Signed)
Carelink Summary Report / Loop Recorder 

## 2018-08-26 LAB — CUP PACEART REMOTE DEVICE CHECK
Implantable Pulse Generator Implant Date: 20161028
MDC IDC SESS DTM: 20191107103955

## 2018-09-11 ENCOUNTER — Ambulatory Visit (INDEPENDENT_AMBULATORY_CARE_PROVIDER_SITE_OTHER): Payer: 59

## 2018-09-11 DIAGNOSIS — R002 Palpitations: Secondary | ICD-10-CM

## 2018-09-11 NOTE — Progress Notes (Signed)
Carelink Summary Report / Loop Recorder 

## 2018-09-12 LAB — CUP PACEART REMOTE DEVICE CHECK
Implantable Pulse Generator Implant Date: 20161028
MDC IDC SESS DTM: 20200113083815

## 2018-09-17 ENCOUNTER — Other Ambulatory Visit: Payer: Self-pay | Admitting: Internal Medicine

## 2018-09-17 LAB — CUP PACEART REMOTE DEVICE CHECK
Date Time Interrogation Session: 20191210103604
Implantable Pulse Generator Implant Date: 20161028

## 2018-09-28 DIAGNOSIS — E119 Type 2 diabetes mellitus without complications: Secondary | ICD-10-CM | POA: Diagnosis not present

## 2018-10-13 ENCOUNTER — Ambulatory Visit (INDEPENDENT_AMBULATORY_CARE_PROVIDER_SITE_OTHER): Payer: 59

## 2018-10-13 DIAGNOSIS — R002 Palpitations: Secondary | ICD-10-CM | POA: Diagnosis not present

## 2018-10-13 LAB — CUP PACEART REMOTE DEVICE CHECK
Date Time Interrogation Session: 20200214094637
Implantable Pulse Generator Implant Date: 20161028

## 2018-10-16 DIAGNOSIS — D225 Melanocytic nevi of trunk: Secondary | ICD-10-CM | POA: Diagnosis not present

## 2018-10-16 DIAGNOSIS — Z1283 Encounter for screening for malignant neoplasm of skin: Secondary | ICD-10-CM | POA: Diagnosis not present

## 2018-10-16 DIAGNOSIS — D2260 Melanocytic nevi of unspecified upper limb, including shoulder: Secondary | ICD-10-CM | POA: Diagnosis not present

## 2018-10-16 DIAGNOSIS — L570 Actinic keratosis: Secondary | ICD-10-CM | POA: Diagnosis not present

## 2018-10-18 DIAGNOSIS — I480 Paroxysmal atrial fibrillation: Secondary | ICD-10-CM | POA: Diagnosis not present

## 2018-10-18 DIAGNOSIS — I10 Essential (primary) hypertension: Secondary | ICD-10-CM | POA: Diagnosis not present

## 2018-10-18 DIAGNOSIS — E119 Type 2 diabetes mellitus without complications: Secondary | ICD-10-CM | POA: Diagnosis not present

## 2018-10-24 NOTE — Progress Notes (Signed)
Carelink Summary Report / Loop Recorder 

## 2018-11-15 ENCOUNTER — Other Ambulatory Visit: Payer: Self-pay

## 2018-11-15 ENCOUNTER — Ambulatory Visit (INDEPENDENT_AMBULATORY_CARE_PROVIDER_SITE_OTHER): Payer: 59 | Admitting: *Deleted

## 2018-11-15 DIAGNOSIS — I480 Paroxysmal atrial fibrillation: Secondary | ICD-10-CM | POA: Diagnosis not present

## 2018-11-15 LAB — CUP PACEART REMOTE DEVICE CHECK
Date Time Interrogation Session: 20200318124142
Implantable Pulse Generator Implant Date: 20161028

## 2018-11-23 NOTE — Progress Notes (Signed)
Carelink Summary Report / Loop Recorder 

## 2018-12-18 ENCOUNTER — Ambulatory Visit (INDEPENDENT_AMBULATORY_CARE_PROVIDER_SITE_OTHER): Payer: 59 | Admitting: *Deleted

## 2018-12-18 ENCOUNTER — Other Ambulatory Visit: Payer: Self-pay

## 2018-12-18 DIAGNOSIS — I480 Paroxysmal atrial fibrillation: Secondary | ICD-10-CM

## 2018-12-18 LAB — CUP PACEART REMOTE DEVICE CHECK
Date Time Interrogation Session: 20200420141156
Implantable Pulse Generator Implant Date: 20161028

## 2018-12-19 ENCOUNTER — Telehealth: Payer: Self-pay | Admitting: *Deleted

## 2018-12-19 NOTE — Telephone Encounter (Signed)
Due to see AF clinic in June. Please reschedule that to a telehealth visit with me (in June) and we can discuss his linq device and make plans for possible removal or replacement after COVID 19.

## 2018-12-19 NOTE — Telephone Encounter (Signed)
Spoke with patient regarding LINQ at RRT as of 12/17/18. Pt feels like he is doing well at this time, feels like his atrial arrhythmias are controlled on flecainide. He reports his LINQ is not bothersome, so he is not inclined to have it removed. He is open to another LINQ if that is Dr. Jackalyn Lombard recommendation and if insurance will cover it. Advised I will route this message to Dr. Rayann Heman for recommendations. Pt is agreeable to plan and thanked me for my call.  Will continue monitoring remotely via Carelink until recommendations received. LINQ should still transmit automatically until ~01/16/19.

## 2018-12-25 NOTE — Progress Notes (Signed)
Carelink Summary Report / Loop Recorder 

## 2019-01-09 DIAGNOSIS — E119 Type 2 diabetes mellitus without complications: Secondary | ICD-10-CM | POA: Diagnosis not present

## 2019-01-12 ENCOUNTER — Other Ambulatory Visit: Payer: Self-pay | Admitting: Internal Medicine

## 2019-01-17 DIAGNOSIS — I10 Essential (primary) hypertension: Secondary | ICD-10-CM | POA: Diagnosis not present

## 2019-01-17 DIAGNOSIS — Z0000 Encounter for general adult medical examination without abnormal findings: Secondary | ICD-10-CM | POA: Diagnosis not present

## 2019-01-17 DIAGNOSIS — E1159 Type 2 diabetes mellitus with other circulatory complications: Secondary | ICD-10-CM | POA: Diagnosis not present

## 2019-01-19 ENCOUNTER — Ambulatory Visit (INDEPENDENT_AMBULATORY_CARE_PROVIDER_SITE_OTHER): Payer: 59 | Admitting: *Deleted

## 2019-01-19 DIAGNOSIS — I480 Paroxysmal atrial fibrillation: Secondary | ICD-10-CM | POA: Diagnosis not present

## 2019-01-22 LAB — CUP PACEART REMOTE DEVICE CHECK
Date Time Interrogation Session: 20200523141052
Implantable Pulse Generator Implant Date: 20161028

## 2019-01-29 NOTE — Progress Notes (Signed)
Carelink Summary Report / Loop Recorder 

## 2019-01-30 ENCOUNTER — Telehealth: Payer: Self-pay | Admitting: Cardiology

## 2019-01-30 NOTE — Telephone Encounter (Signed)
Spoke with Mr Yankowski who had symptoms of irregular pulse and feeling "drained" yesterday evening. Episode was in progress when he sent remote, so data incomplete. He will send additional manual transmission when he gets home this afternoon. Data available appears to be Afib. With hx of Afib that is typically asymptomatic, will forward to Afib clinic to try and schedule visit for him.    Legrand Como [address]" [place], PA-C 01/30/2019 9:24 AM

## 2019-01-30 NOTE — Telephone Encounter (Signed)
Pt sent transmission this afternoon which reveals return to normal rhythm. Pt is feeling much better today. He did not feel he needed to be seen today and has follow up with Dr. Rayann Heman in a few weeks.  He was surprised he actually felt his AF because most of the time he does not know when he goes into AF. He felt overwhelmingly tired - he denied any change in his activity or diet yesterday. Pt appreciative of call if afib should return quickly he will call me back.

## 2019-01-30 NOTE — Telephone Encounter (Signed)
Pt called and LMOVM requesting a call back. In his voicemail he stated that he sent a remote transmission b/c he was having symptoms.   Full report received.  Called pt back and LMVOM for him to return call to discuss symptoms so I can send a note to RN.

## 2019-01-30 NOTE — Telephone Encounter (Signed)
Left message

## 2019-02-01 ENCOUNTER — Other Ambulatory Visit: Payer: Self-pay

## 2019-02-01 ENCOUNTER — Ambulatory Visit (HOSPITAL_COMMUNITY)
Admission: RE | Admit: 2019-02-01 | Discharge: 2019-02-01 | Disposition: A | Discharge status: Home / Self Care | Payer: 59 | Source: Ambulatory Visit | Attending: Nurse Practitioner | Admitting: Nurse Practitioner

## 2019-02-01 ENCOUNTER — Telehealth (HOSPITAL_COMMUNITY): Payer: Self-pay | Admitting: *Deleted

## 2019-02-01 DIAGNOSIS — I480 Paroxysmal atrial fibrillation: Secondary | ICD-10-CM | POA: Diagnosis not present

## 2019-02-01 NOTE — Telephone Encounter (Signed)
Pt called in again today stating he had another episode of feeling extremely tired. Strip from linq transmission appears to be AF. Does not have complete report on carelink from this morning but pt states it finally subsided around 5am. Will add on for virtual visit to further assess/discuss. Pt in agreement.

## 2019-02-01 NOTE — Progress Notes (Signed)
Electrophysiology TeleHealth Note   Due to national recommendations of social distancing due to [place] 19, Audio/video  telehealth visit is felt to be most appropriate for this patient at this time.  See MyChart message/consent below  from today for patient consent regarding telehealth for the At[facility] Clinic.    Date:  02/01/2019   ID:  Jorge Peterson, DOB 1961/01/11, MRN [id number]  Location: home   Provider location: [address] [place], [place] [address] Evaluation Performed:  Follow up   PCP:  [name], MD  Primary Cardiologist: Dr. Rayann Heman Primary Electrophysiologist: Dr. Rayann Heman  CC:" I  have had 2 episodes over the last week"   History of Present Illness: Jorge Peterson is a 58 y.o. male who presents via video conferencing for a telehealth visit today.   The patient is beng seen for noticing heart rhythm disturbance recently. On episode last week that lasted 12 hours. The other episode was last night that started around 8 pm and lasted until 5 am, now back in rhythm. He has a history of ablation in 2012. He has been on flecainide and low dose BB with h/o of brady and has done well. Review of hs paceart reports usually note some afib every month so I am not so sure why he is noticing more so now. He denies any recent surgery or change in his health.Hs afib strip  that I reviewed this am showed slow VR.His linq is nearing ERI and he has an appointment with Dr. Rayann Heman 6/18 to further review and discuss.   Today, he denies symptoms of palpitations, chest pain, shortness of breath, orthopnea, PND, lower extremity edema, claudication, dizziness, presyncope, syncope, bleeding, or neurologic sequela. The patient is tolerating medications without difficulties and is otherwise without complaint today.   he denies symptoms of cough, fevers, chills, or new SOB worrisome for COVID 19.    Atrial Fibrillation Risk Factors:  he does not have symptoms or diagnosis of sleep  apnea. he does not have a history of rheumatic fever. he does not have a history of alcohol use. The patient does not have a history of early familial atrial fibrillation or other arrhythmias.  he has a BMI of There is no height or weight on file to calculate BMI.. There were no vitals filed for this visit.  Past Medical History:  Diagnosis Date  . Atrial fibrillation ([place])    s/p afib ablation 7/12  . Bradycardia    asymptomatic  . Colonic diverticular abscess   . Diabetes ([place])   . Diverticulitis   . Dysrhythmia   . Elevated LFTs   . Fall   . Family history of anesthesia complication    father gets pneumonia after "  . GERD (gastroesophageal reflux disease)   . Hyperlipidemia   . Hyperplastic colon polyp   . Hypertension   . Internal hemorrhoids   . Tinnitus   . Tubular adenoma of colon   . Wears glasses    Past Surgical History:  Procedure Laterality Date  . ATRIAL ABLATION SURGERY  03/24/11   PVI by Greggory Brandy  . BACK SURGERY    . BRONCHOSCOPY  01/23/2013    biopsy  . CHOLECYSTECTOMY  05/02/2014   LAP CHOLE  . CHOLECYSTECTOMY N/A 05/02/2014   Procedure: LAPAROSCOPIC CHOLECYSTECTOMY WITH INTRAOPERATIVE CHOLANGIOGRAM;  Surgeon: Erroll Luna, MD;  Location: [place];  Service: General;  Laterality: N/A;  . EP IMPLANTABLE DEVICE N/A 06/27/2015   Procedure: Loop Recorder Insertion;  Surgeon: [name], MD;  Location: [place] [facility];  Service: Cardiovascular;  Laterality: N/A;  . ERCP N/A 05/25/2014   Procedure: ENDOSCOPIC RETROGRADE CHOLANGIOPANCREATOGRAPHY (ERCP);  Surgeon: [name], MD;  Location: [place];  Service: Endoscopy;  Laterality: N/A;  . Fistula surgery    . MEDIASTINOSCOPY N/A 01/23/2013   Procedure: MEDIASTINOSCOPY;  Surgeon: Ivin Poot, MD;  Location: [place];  Service: Thoracic;  Laterality: N/A;  . QUADRICEPS TENDON REPAIR Right 01/13/2016   Procedure: REPAIR QUADRICEP TENDON;  Surgeon: Latanya Maudlin, MD;  [facility];  Service:  Orthopedics;  Laterality: Right;  Received femoral nerve block in holding  . SHOULDER SURGERY     x 2  . TONSILLECTOMY    . VIDEO BRONCHOSCOPY WITH ENDOBRONCHIAL ULTRASOUND N/A 01/23/2013   Procedure: VIDEO BRONCHOSCOPY WITH ENDOBRONCHIAL ULTRASOUND;  Surgeon: Ivin Poot, MD;  Location: [facility];  Service: Thoracic;  Laterality: N/A;     Current Outpatient Medications  Medication Sig Dispense Refill  . acetaminophen (TYLENOL) 500 MG tablet Take 2 tablets (1,000 mg total) by mouth every 6 (six) hours as needed. 30 tablet 0  . apixaban (ELIQUIS) 5 MG TABS tablet Take 1 tablet (5 mg total) by mouth 2 (two) times daily. 60 tablet 6  . BIOTIN PO Take 7,500 mcg by mouth daily.    . Coenzyme Q10 (COQ10) 150 MG CAPS Take 1 capsule by mouth daily at 10 pm.     . flecainide (TAMBOCOR) 50 MG tablet TAKE 1 TABLET BY MOUTH TWICE A DAY 180 tablet 2  . fluticasone (FLONASE) 50 MCG/ACT nasal spray Place 1 spray into the nose daily as needed for allergies.     . folic acid (FOLVITE) 272 MCG tablet Take 400 mcg by mouth daily at 6 (six) AM.     . hydrochlorothiazide (HYDRODIURIL) 12.5 MG tablet Take 12.5 mg by mouth daily.  3  . losartan (COZAAR) 100 MG tablet Take 100 mg by mouth daily at 10 pm.  3  . metFORMIN (GLUCOPHAGE-XR) 500 MG 24 hr tablet Take 500 mg by mouth daily at 6 PM.     . metoprolol succinate (TOPROL-XL) 25 MG 24 hr tablet Take 0.5 tablets (12.5 mg total) by mouth daily. (Patient taking differently: Take 12.5 mg by mouth daily at 10 pm. ) 45 tablet 3  . Multiple Vitamin (MULTIVITAMIN WITH MINERALS) TABS tablet Take 1 tablet by mouth daily. Centrum Silver    . Omega-3 Fatty Acids (FISH OIL) 1200 MG CAPS Take 1,200-2,400 mg by mouth See admin instructions. Take 2 capsules (2400 mg) by mouth in the morning & take 1 capsule (1200 mg) by mouth at night.    [name] omeprazole (PRILOSEC) 40 MG capsule TAKE 1 CAPSULE BY MOUTH EVERY DAY 90 capsule 1  . Probiotic Product (PROBIOTIC PO) Take 1 capsule by  mouth 2 (two) times daily.    . simvastatin (ZOCOR) 20 MG tablet TAKE 1 TABLET BY MOUTH EVERYDAY AT BEDTIME 90 tablet 2  . traMADol (ULTRAM) 50 MG tablet Take 1 tablet (50 mg total) by mouth every 6 (six) hours as needed (mild pain). 20 tablet 0   No current facility-administered medications for this encounter.     Allergies:   Codeine; Latex; and Oxycodone-acetaminophen   Social History:  The patient  reports that he has never smoked. He has never used smokeless tobacco. He reports that he does not drink alcohol or use drugs.   Family History:  The patient's  family history includes  Diabetes in his father; Heart disease in his father; Liver disease in his mother.    ROS:  Please see the history of present illness.   All other systems are personally reviewed and negative.   Exam: Well appearing, alert and conversant, regular work of breathing,  good skin color  Recent Labs: 05/12/2018: ALT 20 05/14/2018: Magnesium 2.1 07/14/2018: BUN 17; Creatinine, Ser 1.13; Hemoglobin 12.9; Platelets 182; Potassium 3.9; Sodium 141  personally reviewed    Other studies personally reviewed: Epic notes, ekg's, paceart reports    ASSESSMENT AND PLAN:  1. Paroxysmal  atrial fibrillation 2 episodes in the last week with pt reporting fatigue He has svr n afib He also has HR in the low 50's while in SR Pt mentions that Dr. Rayann Heman has mentioned possibility of a PPM in the past I am hesitant to increase BB for fear of worsening bradycardia and I do not want to increase flecainide for first degree AVB No triggers identified Continue eliquis 5 mg bid   This patients CHA2DS2-VASc Score and unadjusted Ischemic Stroke Rate (% per year) is equal to 0.6 % stroke rate/year from a score of 1  Above score calculated as 1 point each if present [CHF, HTN, DM, Vascular=MI/PAD/Aortic Plaque, Age if 65-74, or Male] Above score calculated as 2 points each if present [Age > 75, or Stroke/TIA/TE]    COVID  screen The patient does not have any symptoms that suggest any further testing/ screening at this time.  Social distancing reinforced today.    Follow-up:   Continue to monitor and f/u with Dr. Rayann Heman 6/18 as scheduled  Current medicines are reviewed at length with the patient today.   The patient does not have concerns regarding his medicines.  The following changes were made today:  none  Labs/ tests ordered today include: none No orders of the defined types were placed in this encounter.   Patient Risk:  after full review of this patients clinical status, I feel that they are at moderate  risk at this time.   Today, I have spent  20 minutes with the patient with telehealth technology discussing above  Signed, Roderic Palau NP  02/01/2019 3:38 PM  Afib [facility] [place], [place] [address] [phone]   I hereby voluntarily request, consent and authorize the [facility] and its employed or contracted physicians, physician assistants, nurse practitioners or other licensed health care professionals (the Practitioner), to provide me with telemedicine health care services (the "Services") as deemed necessary by the treating Practitioner. I acknowledge and consent to receive the Services by the Practitioner via telemedicine. I understand that the telemedicine visit will involve communicating with the Practitioner through live audiovisual communication technology and the disclosure of certain medical information by electronic transmission. I acknowledge that I have been given the opportunity to request an in-person assessment or other available alternative prior to the telemedicine visit and am voluntarily participating in the telemedicine visit.   I understand that I have the right to withhold or withdraw my consent to the use of telemedicine in the course of my care at any time, without affecting my right to future care or treatment, and that  the Practitioner or I may terminate the telemedicine visit at any time. I understand that I have the right to inspect all information obtained and/or recorded in the course of the telemedicine visit and may receive copies of available information for a reasonable fee.  I understand that some  of the potential risks of receiving the Services via telemedicine include:   Delay or interruption in medical evaluation due to technological equipment failure or disruption;  Information transmitted may not be sufficient (e.g. poor resolution of images) to allow for appropriate medical decision making by the Practitioner; and/or  In rare instances, security protocols could fail, causing a breach of personal health information.   Furthermore, I acknowledge that it is my responsibility to provide information about my medical history, conditions and care that is complete and accurate to the best of my ability. I acknowledge that Practitioner's advice, recommendations, and/or decision may be based on factors not within their control, such as incomplete or inaccurate data provided by me or distortions of diagnostic images or specimens that may result from electronic transmissions. I understand that the practice of medicine is not an exact science and that Practitioner makes no warranties or guarantees regarding treatment outcomes. I acknowledge that I will receive a copy of this consent concurrently upon execution via email to the email address I last provided but may also request a printed copy by calling the office of the [facility].  I understand that my insurance will be billed for this visit.   I have read or had this consent read to me.  I understand the contents of this consent, which adequately explains the benefits and risks of the Services being provided via telemedicine.  I have been provided ample opportunity to ask questions regarding this consent and the Services and have had my questions  answered to my satisfaction.  I give my informed consent for the services to be provided through the use of telemedicine in my medical care  By participating in this telemedicine visit I agree to the above.

## 2019-02-09 ENCOUNTER — Telehealth: Payer: Self-pay

## 2019-02-09 ENCOUNTER — Other Ambulatory Visit (HOSPITAL_COMMUNITY): Payer: Self-pay | Admitting: *Deleted

## 2019-02-09 MED ORDER — METOPROLOL SUCCINATE ER 25 MG PO TB24: 12.5000 mg | ORAL_TABLET | Freq: Every day | ORAL | 3 refills | Status: DC

## 2019-02-09 NOTE — Telephone Encounter (Signed)
-----   Message from [name], MD sent at 02/07/2019  9:54 PM EDT ----- Im seeing him 6/18. See recent AF clinic note.  His loop recorder battery is at end of service.  Could you reach out to him to see if he wants it out, replaced, or pacemaker (hes had sinus pauses before)?  Might be good to have a plan prior to the visit.  Otherwise, I can just discuss further with him that day.

## 2019-02-13 NOTE — Telephone Encounter (Signed)
Left detailed message requesting call back in regards to appt on June 18.  Asked Pt to respond via MyChart or call this nurse back.  See Mychart message for details.

## 2019-02-14 ENCOUNTER — Telehealth: Payer: Self-pay

## 2019-02-14 NOTE — Telephone Encounter (Signed)
Spoke with pt regarding appt on 02/15/19. Pt was advise to check vitals prior to appt. Pt questions and concerns were address.

## 2019-02-15 ENCOUNTER — Telehealth (INDEPENDENT_AMBULATORY_CARE_PROVIDER_SITE_OTHER): Payer: 59 | Admitting: Internal Medicine

## 2019-02-15 ENCOUNTER — Encounter: Payer: Self-pay | Admitting: Internal Medicine

## 2019-02-15 ENCOUNTER — Telehealth: Payer: Self-pay

## 2019-02-15 VITALS — BP 157/96 | HR 51 | Ht 67.0 in | Wt 228.0 lb

## 2019-02-15 DIAGNOSIS — R001 Bradycardia, unspecified: Secondary | ICD-10-CM | POA: Diagnosis not present

## 2019-02-15 DIAGNOSIS — I10 Essential (primary) hypertension: Secondary | ICD-10-CM | POA: Diagnosis not present

## 2019-02-15 DIAGNOSIS — I480 Paroxysmal atrial fibrillation: Secondary | ICD-10-CM

## 2019-02-15 NOTE — Progress Notes (Signed)
Electrophysiology TeleHealth Note   Due to national recommendations of social distancing due to COVID 19, an audio/video telehealth visit is felt to be most appropriate for this patient at this time.  See MyChart message from today for the patient's consent to telehealth for [facility].   Date:  02/15/2019   ID:  Jorge Peterson, DOB 03-03-1961, MRN [id number]  Location: patient's home  Provider location:  [facility]  Evaluation Performed: Follow-up visit  PCP:  [name], MD   Electrophysiologist:  Dr Rayann Heman  Chief Complaint:  palpitations  History of Present Illness:    Jorge Peterson is a 58 y.o. male who presents via telehealth conferencing today.  Since last being seen in our clinic, the patient reports doing very well.  Today, he denies symptoms of palpitations, chest pain, shortness of breath,  lower extremity edema, dizziness, presyncope, or syncope.  The patient is otherwise without complaint today.  The patient denies symptoms of fevers, chills, cough, or new SOB worrisome for COVID 19.  Past Medical History:  Diagnosis Date  . Atrial fibrillation ([place])    s/p afib ablation 7/12  . Bradycardia    asymptomatic  . Colonic diverticular abscess   . Diabetes ([place])   . Diverticulitis   . Dysrhythmia   . Elevated LFTs   . Fall   . Family history of anesthesia complication    father gets pneumonia after "  . GERD (gastroesophageal reflux disease)   . Hyperlipidemia   . Hyperplastic colon polyp   . Hypertension   . Internal hemorrhoids   . Tinnitus   . Tubular adenoma of colon   . Wears glasses     Past Surgical History:  Procedure Laterality Date  . ATRIAL ABLATION SURGERY  03/24/11   PVI by Greggory Brandy  . BACK SURGERY    . BRONCHOSCOPY  01/23/2013    biopsy  . CHOLECYSTECTOMY  05/02/2014   LAP CHOLE  . CHOLECYSTECTOMY N/A 05/02/2014   Procedure: LAPAROSCOPIC CHOLECYSTECTOMY WITH INTRAOPERATIVE CHOLANGIOGRAM;  Surgeon: Erroll Luna, MD;  Location:  [place];  Service: General;  Laterality: N/A;  . EP IMPLANTABLE DEVICE N/A 06/27/2015   Procedure: Loop Recorder Insertion;  Surgeon: [name], MD;  Location: [place] [facility];  Service: Cardiovascular;  Laterality: N/A;  . ERCP N/A 05/25/2014   Procedure: ENDOSCOPIC RETROGRADE CHOLANGIOPANCREATOGRAPHY (ERCP);  Surgeon: [name], MD;  Location: [place];  Service: Endoscopy;  Laterality: N/A;  . Fistula surgery    . MEDIASTINOSCOPY N/A 01/23/2013   Procedure: MEDIASTINOSCOPY;  Surgeon: Ivin Poot, MD;  Location: [place];  Service: Thoracic;  Laterality: N/A;  . QUADRICEPS TENDON REPAIR Right 01/13/2016   Procedure: REPAIR QUADRICEP TENDON;  Surgeon: Latanya Maudlin, MD;  [facility];  Service: Orthopedics;  Laterality: Right;  Received femoral nerve block in holding  . SHOULDER SURGERY     x 2  . TONSILLECTOMY    . VIDEO BRONCHOSCOPY WITH ENDOBRONCHIAL ULTRASOUND N/A 01/23/2013   Procedure: VIDEO BRONCHOSCOPY WITH ENDOBRONCHIAL ULTRASOUND;  Surgeon: Ivin Poot, MD;  Location: [facility];  Service: Thoracic;  Laterality: N/A;    Current Outpatient Medications  Medication Sig Dispense Refill  . acetaminophen (TYLENOL) 500 MG tablet Take 2 tablets (1,000 mg total) by mouth every 6 (six) hours as needed. 30 tablet 0  . apixaban (ELIQUIS) 5 MG TABS tablet Take 1 tablet (5 mg total) by mouth 2 (two) times daily. 60 tablet 6  . BIOTIN PO Take 7,500  mcg by mouth daily.    . Coenzyme Q10 (COQ10) 150 MG CAPS Take 1 capsule by mouth daily at 10 pm.     . flecainide (TAMBOCOR) 50 MG tablet TAKE 1 TABLET BY MOUTH TWICE A DAY 180 tablet 2  . fluticasone (FLONASE) 50 MCG/ACT nasal spray Place 1 spray into the nose daily as needed for allergies.     . folic acid (FOLVITE) 962 MCG tablet Take 400 mcg by mouth daily at 6 (six) AM.     . hydrochlorothiazide (HYDRODIURIL) 12.5 MG tablet Take 12.5 mg by mouth daily.  3  . losartan (COZAAR) 100 MG tablet Take 100 mg by mouth daily at 10 pm.  3   . metFORMIN (GLUCOPHAGE-XR) 500 MG 24 hr tablet Take 500 mg by mouth daily at 6 PM.     . metoprolol succinate (TOPROL-XL) 25 MG 24 hr tablet Take 0.5 tablets (12.5 mg total) by mouth daily. 45 tablet 3  . Multiple Vitamin (MULTIVITAMIN WITH MINERALS) TABS tablet Take 1 tablet by mouth daily. Centrum Silver    . Omega-3 Fatty Acids (FISH OIL) 1200 MG CAPS Take 1,200-2,400 mg by mouth See admin instructions. Take 2 capsules (2400 mg) by mouth in the morning & take 1 capsule (1200 mg) by mouth at night.    [name] omeprazole (PRILOSEC) 40 MG capsule TAKE 1 CAPSULE BY MOUTH EVERY DAY 90 capsule 1  . Probiotic Product (PROBIOTIC PO) Take 1 capsule by mouth 2 (two) times daily.    . simvastatin (ZOCOR) 20 MG tablet TAKE 1 TABLET BY MOUTH EVERYDAY AT BEDTIME 90 tablet 2  . traMADol (ULTRAM) 50 MG tablet Take 1 tablet (50 mg total) by mouth every 6 (six) hours as needed (mild pain). 20 tablet 0   No current facility-administered medications for this visit.     Allergies:   Codeine, Latex, and Oxycodone-acetaminophen   Social History:  The patient  reports that he has never smoked. He has never used smokeless tobacco. He reports that he does not drink alcohol or use drugs.   Family History:  The patient's  family history includes Diabetes in his father; Heart disease in his father; Liver disease in his mother.   ROS:  Please see the history of present illness.   All other systems are personally reviewed and negative.    Exam:    Vital Signs:  BP (!) 157/96   Pulse (!) 51   Ht 5\' 7"  (1.702 m)   Wt 228 lb (103.4 kg)   BMI 35.71 kg/m   Well appearing, NAD, OP clear  Normal WOB   Labs/Other Tests and Data Reviewed:    Recent Labs: 05/12/2018: ALT 20 05/14/2018: Magnesium 2.1 07/14/2018: BUN 17; Creatinine, Ser 1.13; Hemoglobin 12.9; Platelets 182; Potassium 3.9; Sodium 141   Wt Readings from Last 3 Encounters:  02/15/19 228 lb (103.4 kg)  07/31/18 220 lb 12.8 oz (100.2 kg)  07/12/18 218 lb  (98.9 kg)     Last device remote is reviewed from [place] PDF which reveals normal device function, no arrhythmias   ASSESSMENT & PLAN:    1.  afib Reasonably well controlled (AF burden 2% overall, 0% since last visit) He has had asymptomatic pauses with metoprolol for which he is quite worried.  I have advised consideration of ablation in order to stop metoprolol/ flecainide eventually in hopes of avoiding pacing.  He would prefer to stop these medicines without ablation and see how he does. We discussed reimplantation of an ILR.  He is at EOL with his device.  He is not ready to proceed but will consider this option. chad2vascs score is 2.  He is on eliquis  2. HTN Stable No change required today  3. Bradycardia asymtomatic I have advised that there is no urgent indication for pacing.  Supportive care and stopping metoprolol/ flecainide were discussed today.  Ablation could assist with this long term.  He will contemplate options of ILR and also ablation and contact my office if he decides to proceed.  Follow-up:  3 months   Patient Risk:  after full review of this patients clinical status, I feel that they are at moderate risk at this time.  Today, I have spent 15 minutes with the patient with telehealth technology discussing arrhythmia management .    Army Fossa, MD  02/15/2019 10:02 AM     [facility] [address] [place] [address] [phone] (office) [phone] (fax)

## 2019-02-15 NOTE — Telephone Encounter (Signed)
Left message regarding appt on 02/15/19.

## 2019-02-22 ENCOUNTER — Ambulatory Visit (INDEPENDENT_AMBULATORY_CARE_PROVIDER_SITE_OTHER): Payer: 59 | Admitting: *Deleted

## 2019-02-22 DIAGNOSIS — I480 Paroxysmal atrial fibrillation: Secondary | ICD-10-CM | POA: Diagnosis not present

## 2019-02-22 LAB — CUP PACEART REMOTE DEVICE CHECK
Date Time Interrogation Session: 20200625144101
Implantable Pulse Generator Implant Date: 20161028

## 2019-03-01 NOTE — Progress Notes (Signed)
Carelink Summary Report / Loop Recorder 

## 2019-03-07 ENCOUNTER — Other Ambulatory Visit: Payer: Self-pay | Admitting: Internal Medicine

## 2019-03-07 NOTE — Telephone Encounter (Signed)
Age 58, weight 103.4kg, SCr 1.13 on 07/14/18, last OV 01/2019, afib indication

## 2019-03-12 ENCOUNTER — Other Ambulatory Visit: Payer: Self-pay | Admitting: Internal Medicine

## 2019-03-27 ENCOUNTER — Ambulatory Visit (INDEPENDENT_AMBULATORY_CARE_PROVIDER_SITE_OTHER): Payer: 59 | Admitting: *Deleted

## 2019-03-27 DIAGNOSIS — R001 Bradycardia, unspecified: Secondary | ICD-10-CM | POA: Diagnosis not present

## 2019-03-27 LAB — CUP PACEART REMOTE DEVICE CHECK
Date Time Interrogation Session: 20200728153924
Implantable Pulse Generator Implant Date: 20161028

## 2019-04-03 ENCOUNTER — Telehealth: Payer: Self-pay

## 2019-04-03 NOTE — Telephone Encounter (Signed)
Left message for patient to advise of disconnected monitor 

## 2019-04-09 NOTE — Progress Notes (Signed)
Carelink Summary Report / Loop Recorder 

## 2019-04-13 ENCOUNTER — Telehealth: Payer: Self-pay

## 2019-04-13 NOTE — Telephone Encounter (Signed)
Spoke to pt regarding alert for EOS, inaccurate data on transmission, and message he sent on 04/09/19. Informed him that his Cruz Condon is at EOS and that I would send a message to Dr. Bonita Quin nurse and scheduler to set up appt to discuss replacing ILR.

## 2019-04-16 ENCOUNTER — Telehealth (INDEPENDENT_AMBULATORY_CARE_PROVIDER_SITE_OTHER): Payer: 59 | Admitting: Internal Medicine

## 2019-04-16 ENCOUNTER — Telehealth: Payer: Self-pay

## 2019-04-16 DIAGNOSIS — I10 Essential (primary) hypertension: Secondary | ICD-10-CM

## 2019-04-16 DIAGNOSIS — I480 Paroxysmal atrial fibrillation: Secondary | ICD-10-CM | POA: Diagnosis not present

## 2019-04-16 DIAGNOSIS — R001 Bradycardia, unspecified: Secondary | ICD-10-CM

## 2019-04-16 NOTE — Telephone Encounter (Signed)
Spoke with pt regarding covid-19 screening prior to appt. Pt stated he has not been in contact with anyone who may have covid-19 and has no symptoms. 

## 2019-04-16 NOTE — Telephone Encounter (Signed)
Call placed to Pt.  Pt scheduled for loop removal and replacement on 04/08/2019 with Dr. Rayann Heman

## 2019-04-16 NOTE — Progress Notes (Signed)
Electrophysiology TeleHealth Note  Due to national recommendations of social distancing due to [place] 19, an audio telehealth visit is felt to be most appropriate for this patient at this time.  Verbal consent was obtained by me for the telehealth visit today.  The patient does not have capability for a virtual visit.  A phone visit is therefore required today.   Date:  04/16/2019   ID:  Jorge Peterson, DOB November 23, 1960, MRN [id number]  Location: patient's home  Provider location:  [facility]  Evaluation Performed: Follow-up visit  PCP:  [name], MD   Electrophysiologist:  Dr Rayann Heman  Chief Complaint:  palpitations  History of Present Illness:    Jorge Peterson is a 58 y.o. male who presents via telehealth conferencing today.  Since last being seen in our clinic, the patient reports doing very well. He continues to have AF episodes, though these are of a low burden.  Today, he denies symptoms of palpitations, chest pain, shortness of breath,  lower extremity edema, dizziness, presyncope, or syncope.  The patient is otherwise without complaint today.  The patient denies symptoms of fevers, chills, cough, or new SOB worrisome for COVID 19.  Past Medical History:  Diagnosis Date  . Atrial fibrillation ([place])    s/p afib ablation 7/12  . Bradycardia    asymptomatic  . Colonic diverticular abscess   . Diabetes ([place])   . Diverticulitis   . Dysrhythmia   . Elevated LFTs   . Fall   . Family history of anesthesia complication    father gets pneumonia after "  . GERD (gastroesophageal reflux disease)   . Hyperlipidemia   . Hyperplastic colon polyp   . Hypertension   . Internal hemorrhoids   . Tinnitus   . Tubular adenoma of colon   . Wears glasses     Past Surgical History:  Procedure Laterality Date  . ATRIAL ABLATION SURGERY  03/24/11   PVI by Greggory Brandy  . BACK SURGERY    . BRONCHOSCOPY  01/23/2013    biopsy  . CHOLECYSTECTOMY  05/02/2014   LAP CHOLE  .  CHOLECYSTECTOMY N/A 05/02/2014   Procedure: LAPAROSCOPIC CHOLECYSTECTOMY WITH INTRAOPERATIVE CHOLANGIOGRAM;  Surgeon: Erroll Luna, MD;  Location: [place];  Service: General;  Laterality: N/A;  . EP IMPLANTABLE DEVICE N/A 06/27/2015   Procedure: Loop Recorder Insertion;  Surgeon: [name], MD;  Location: [place] [facility];  Service: Cardiovascular;  Laterality: N/A;  . ERCP N/A 05/25/2014   Procedure: ENDOSCOPIC RETROGRADE CHOLANGIOPANCREATOGRAPHY (ERCP);  Surgeon: [name], MD;  Location: [place];  Service: Endoscopy;  Laterality: N/A;  . Fistula surgery    . MEDIASTINOSCOPY N/A 01/23/2013   Procedure: MEDIASTINOSCOPY;  Surgeon: Ivin Poot, MD;  Location: [place];  Service: Thoracic;  Laterality: N/A;  . QUADRICEPS TENDON REPAIR Right 01/13/2016   Procedure: REPAIR QUADRICEP TENDON;  Surgeon: Latanya Maudlin, MD;  [facility];  Service: Orthopedics;  Laterality: Right;  Received femoral nerve block in holding  . SHOULDER SURGERY     x 2  . TONSILLECTOMY    . VIDEO BRONCHOSCOPY WITH ENDOBRONCHIAL ULTRASOUND N/A 01/23/2013   Procedure: VIDEO BRONCHOSCOPY WITH ENDOBRONCHIAL ULTRASOUND;  Surgeon: Ivin Poot, MD;  Location: [facility];  Service: Thoracic;  Laterality: N/A;    Current Outpatient Medications  Medication Sig Dispense Refill  . acetaminophen (TYLENOL) 500 MG tablet Take 2 tablets (1,000 mg total) by mouth every 6 (six) hours as needed. 30 tablet 0  .  BIOTIN PO Take 7,500 mcg by mouth daily.    . Coenzyme Q10 (COQ10) 150 MG CAPS Take 1 capsule by mouth daily at 10 pm.     . ELIQUIS 5 MG TABS tablet TAKE 1 TABLET BY MOUTH TWICE A DAY 60 tablet 6  . flecainide (TAMBOCOR) 50 MG tablet TAKE 1 TABLET BY MOUTH TWICE A DAY 180 tablet 2  . fluticasone (FLONASE) 50 MCG/ACT nasal spray Place 1 spray into the nose daily as needed for allergies.     . folic acid (FOLVITE) 073 MCG tablet Take 400 mcg by mouth daily at 6 (six) AM.     . hydrochlorothiazide (HYDRODIURIL) 12.5 MG  tablet Take 12.5 mg by mouth daily.  3  . losartan (COZAAR) 100 MG tablet Take 100 mg by mouth daily at 10 pm.  3  . metFORMIN (GLUCOPHAGE-XR) 500 MG 24 hr tablet Take 500 mg by mouth daily at 6 PM.     . metoprolol succinate (TOPROL-XL) 25 MG 24 hr tablet Take 0.5 tablets (12.5 mg total) by mouth daily. 45 tablet 3  . Multiple Vitamin (MULTIVITAMIN WITH MINERALS) TABS tablet Take 1 tablet by mouth daily. Centrum Silver    . Omega-3 Fatty Acids (FISH OIL) 1200 MG CAPS Take 1,200-2,400 mg by mouth See admin instructions. Take 2 capsules (2400 mg) by mouth in the morning & take 1 capsule (1200 mg) by mouth at night.    [name] omeprazole (PRILOSEC) 40 MG capsule Take 1 capsule (40 mg total) by mouth daily. NEEDS OFFICE VISIT FOR FURTHER REFILLS 90 capsule 0  . Probiotic Product (PROBIOTIC PO) Take 1 capsule by mouth 2 (two) times daily.    . simvastatin (ZOCOR) 20 MG tablet TAKE 1 TABLET BY MOUTH EVERYDAY AT BEDTIME 90 tablet 2  . traMADol (ULTRAM) 50 MG tablet Take 1 tablet (50 mg total) by mouth every 6 (six) hours as needed (mild pain). 20 tablet 0   No current facility-administered medications for this visit.     Allergies:   Codeine, Latex, and Oxycodone-acetaminophen   Social History:  The patient  reports that he has never smoked. He has never used smokeless tobacco. He reports that he does not drink alcohol or use drugs.   Family History:  The patient's family history includes Diabetes in his father; Heart disease in his father; Liver disease in his mother.   ROS:  Please see the history of present illness.   All other systems are personally reviewed and negative.    Exam:    Vital Signs:  There were no vitals taken for this visit.  Well sounding   Labs/Other Tests and Data Reviewed:    Recent Labs: 05/12/2018: ALT 20 05/14/2018: Magnesium 2.1 07/14/2018: BUN 17; Creatinine, Ser 1.13; Hemoglobin 12.9; Platelets 182; Potassium 3.9; Sodium 141   Wt Readings from Last 3 Encounters:   02/15/19 228 lb (103.4 kg)  07/31/18 220 lb 12.8 oz (100.2 kg)  07/12/18 218 lb (98.9 kg)      ASSESSMENT & PLAN:    1.  Paroxysmal atrial fibrillation Overall burden remains 1-2% His ILR is at EOL.  Today, we discussed device removal with a new device placement.  He finds his ILR beneficial and would like to have this replaced.  We will schedule this at the next available time in the office. Continue eliquis He has stopped flecainide and metoprolol and metoprolol He may be more willing to consider ablation if his AF burden increases.  2. HTN Stable No change  required today  3. Bradycardia Asymptomatic currently   Follow-up:  Arrange ILR reimplantation at the next time.   Patient Risk:  after full review of this patients clinical status, I feel that they are at moderate risk at this time.  Today, I have spent 15 minutes with the patient with telehealth technology discussing arrhythmia management .    Army Fossa, MD  04/16/2019 3:42 PM     [place] [address] [phone] (office) [phone] (fax)

## 2019-04-16 NOTE — Telephone Encounter (Signed)
duplicate

## 2019-04-18 ENCOUNTER — Encounter: Payer: 59 | Admitting: Internal Medicine

## 2019-04-30 ENCOUNTER — Ambulatory Visit (INDEPENDENT_AMBULATORY_CARE_PROVIDER_SITE_OTHER): Payer: 59 | Admitting: *Deleted

## 2019-04-30 DIAGNOSIS — I480 Paroxysmal atrial fibrillation: Secondary | ICD-10-CM | POA: Diagnosis not present

## 2019-04-30 LAB — CUP PACEART REMOTE DEVICE CHECK
Date Time Interrogation Session: 20200830164127
Implantable Pulse Generator Implant Date: 20161028

## 2019-05-08 NOTE — Progress Notes (Signed)
Carelink Summary Report / Loop Recorder 

## 2019-05-11 ENCOUNTER — Telehealth: Payer: Self-pay

## 2019-05-11 NOTE — Telephone Encounter (Signed)
Left message for patient to regarding disconnected monitor 

## 2019-05-14 ENCOUNTER — Encounter: Payer: 59 | Admitting: Internal Medicine

## 2019-05-21 ENCOUNTER — Encounter: Payer: Self-pay | Admitting: Internal Medicine

## 2019-05-23 ENCOUNTER — Telehealth: Payer: Self-pay

## 2019-05-23 NOTE — Telephone Encounter (Signed)
[phone] OPT 4. enter member ID [id number] then his date of birth  the case # is [id number]   Pt loop implant denied by insurance.  Need to schedule peer to peer

## 2019-05-23 NOTE — Telephone Encounter (Signed)
Call placed to phone number provided to schedule peer to peer.  Was on hold for extended period of time.  Spoke with scheduler.  Was advised it is too late for a peer to peer.  Last date to be able to request peer to peer was 05/21/2019.  Was told option at this time was to appeal.  Advised appeal had already been done and denied.  Was advised only option is to appeal and to call [phone] option 1  Will send to precert dept for further guidance.

## 2019-05-24 ENCOUNTER — Encounter: Payer: Self-pay | Admitting: Cardiology

## 2019-05-28 ENCOUNTER — Ambulatory Visit: Payer: 59 | Admitting: Internal Medicine

## 2019-05-31 ENCOUNTER — Ambulatory Visit: Payer: 59 | Admitting: Gastroenterology

## 2019-06-04 ENCOUNTER — Other Ambulatory Visit: Payer: Self-pay | Admitting: Internal Medicine

## 2019-06-11 ENCOUNTER — Ambulatory Visit: Payer: 59 | Admitting: Internal Medicine

## 2019-06-27 ENCOUNTER — Telehealth (INDEPENDENT_AMBULATORY_CARE_PROVIDER_SITE_OTHER): Payer: 59 | Admitting: Internal Medicine

## 2019-06-27 VITALS — BP 123/82 | HR 52 | Ht 67.0 in | Wt 225.0 lb

## 2019-06-27 DIAGNOSIS — I10 Essential (primary) hypertension: Secondary | ICD-10-CM

## 2019-06-27 DIAGNOSIS — I480 Paroxysmal atrial fibrillation: Secondary | ICD-10-CM

## 2019-06-27 DIAGNOSIS — R001 Bradycardia, unspecified: Secondary | ICD-10-CM

## 2019-06-27 NOTE — H&P (View-Only) (Signed)
Electrophysiology TeleHealth Note   Due to national recommendations of social distancing due to COVID 19, an audio/video telehealth visit is felt to be most appropriate for this patient at this time.  See MyChart message from today for the patient's consent to telehealth for [facility].  Date:  06/27/2019   ID:  Jorge Peterson, DOB 1961/03/15, MRN [id number]  Location: patient's home  Provider location:  [facility]  Evaluation Performed: Follow-up visit  PCP:  [name], MD   Electrophysiologist:  Dr Rayann Heman  Chief Complaint:  palpitations  History of Present Illness:    Jorge Peterson is a 58 y.o. male who presents via telehealth conferencing today.  Since last being seen in our clinic, the patient reports doing very well.  He has occasional afib episodes.  Today, he denies symptoms of chest pain, shortness of breath,  lower extremity edema, dizziness, presyncope, or syncope.  The patient is otherwise without complaint today.  The patient denies symptoms of fevers, chills, cough, or new SOB worrisome for COVID 19.  Past Medical History:  Diagnosis Date  . Atrial fibrillation ([place])    s/p afib ablation 7/12  . Bradycardia    asymptomatic  . Colonic diverticular abscess   . Diabetes ([place])   . Diverticulitis   . Dysrhythmia   . Elevated LFTs   . Fall   . Family history of anesthesia complication    father gets pneumonia after "  . GERD (gastroesophageal reflux disease)   . Hyperlipidemia   . Hyperplastic colon polyp   . Hypertension   . Internal hemorrhoids   . Tinnitus   . Tubular adenoma of colon   . Wears glasses     Past Surgical History:  Procedure Laterality Date  . ATRIAL ABLATION SURGERY  03/24/11   PVI by Greggory Brandy  . BACK SURGERY    . BRONCHOSCOPY  01/23/2013    biopsy  . CHOLECYSTECTOMY  05/02/2014   LAP CHOLE  . CHOLECYSTECTOMY N/A 05/02/2014   Procedure: LAPAROSCOPIC CHOLECYSTECTOMY WITH INTRAOPERATIVE CHOLANGIOGRAM;  Surgeon: Erroll Luna, MD;  Location: [place];  Service: General;  Laterality: N/A;  . EP IMPLANTABLE DEVICE N/A 06/27/2015   Procedure: Loop Recorder Insertion;  Surgeon: [name], MD;  Location: [place] [facility];  Service: Cardiovascular;  Laterality: N/A;  . ERCP N/A 05/25/2014   Procedure: ENDOSCOPIC RETROGRADE CHOLANGIOPANCREATOGRAPHY (ERCP);  Surgeon: [name], MD;  Location: [place];  Service: Endoscopy;  Laterality: N/A;  . Fistula surgery    . MEDIASTINOSCOPY N/A 01/23/2013   Procedure: MEDIASTINOSCOPY;  Surgeon: Ivin Poot, MD;  Location: [place];  Service: Thoracic;  Laterality: N/A;  . QUADRICEPS TENDON REPAIR Right 01/13/2016   Procedure: REPAIR QUADRICEP TENDON;  Surgeon: Latanya Maudlin, MD;  [facility];  Service: Orthopedics;  Laterality: Right;  Received femoral nerve block in holding  . SHOULDER SURGERY     x 2  . TONSILLECTOMY    . VIDEO BRONCHOSCOPY WITH ENDOBRONCHIAL ULTRASOUND N/A 01/23/2013   Procedure: VIDEO BRONCHOSCOPY WITH ENDOBRONCHIAL ULTRASOUND;  Surgeon: Ivin Poot, MD;  Location: [facility];  Service: Thoracic;  Laterality: N/A;    Current Outpatient Medications  Medication Sig Dispense Refill  . acetaminophen (TYLENOL) 500 MG tablet Take 2 tablets (1,000 mg total) by mouth every 6 (six) hours as needed. 30 tablet 0  . BIOTIN PO Take 7,500 mcg by mouth daily.    . Coenzyme Q10 (COQ10) 150 MG CAPS Take 1 capsule by mouth  daily at 10 pm.     . ELIQUIS 5 MG TABS tablet TAKE 1 TABLET BY MOUTH TWICE A DAY 60 tablet 6  . fluticasone (FLONASE) 50 MCG/ACT nasal spray Place 1 spray into the nose daily as needed for allergies.     . folic acid (FOLVITE) A999333 MCG tablet Take 400 mcg by mouth daily at 6 (six) AM.     . hydrochlorothiazide (HYDRODIURIL) 12.5 MG tablet Take 12.5 mg by mouth daily.  3  . losartan (COZAAR) 100 MG tablet Take 100 mg by mouth daily at 10 pm.  3  . metFORMIN (GLUCOPHAGE-XR) 500 MG 24 hr tablet Take 500 mg by mouth daily at 6 PM.     .  Multiple Vitamin (MULTIVITAMIN WITH MINERALS) TABS tablet Take 1 tablet by mouth daily. Centrum Silver    . Omega-3 Fatty Acids (FISH OIL) 1200 MG CAPS Take 1,200-2,400 mg by mouth See admin instructions. Take 2 capsules (2400 mg) by mouth in the morning & take 1 capsule (1200 mg) by mouth at night.    [name] omeprazole (PRILOSEC) 40 MG capsule TAKE 1 CAPSULE (40 MG TOTAL) BY MOUTH DAILY. NEEDS OFFICE VISIT FOR FURTHER REFILLS 90 capsule 0  . Probiotic Product (PROBIOTIC PO) Take 1 capsule by mouth 2 (two) times daily.    . simvastatin (ZOCOR) 20 MG tablet TAKE 1 TABLET BY MOUTH EVERYDAY AT BEDTIME 90 tablet 2  . traMADol (ULTRAM) 50 MG tablet Take 1 tablet (50 mg total) by mouth every 6 (six) hours as needed (mild pain). 20 tablet 0   No current facility-administered medications for this visit.     Allergies:   Codeine, Latex, and Oxycodone-acetaminophen   Social History:  The patient  reports that he has never smoked. He has never used smokeless tobacco. He reports that he does not drink alcohol or use drugs.   Family History:  The patient's family history includes Diabetes in his father; Heart disease in his father; Liver disease in his mother.   ROS:  Please see the history of present illness.   All other systems are personally reviewed and negative.    Exam:    Vital Signs:  BP 123/82   Pulse (!) 52   Ht 5\' 7"  (1.702 m)   Wt 225 lb (102.1 kg)   BMI 35.24 kg/m   Well sounding and appearing, alert and conversant, regular work of breathing,  good skin color Eyes- anicteric, neuro- grossly intact, skin- no apparent rash or lesions or cyanosis, mouth- oral mucosa is pink  Labs/Other Tests and Data Reviewed:    Recent Labs: 07/14/2018: BUN 17; Creatinine, Ser 1.13; Hemoglobin 12.9; Platelets 182; Potassium 3.9; Sodium 141   Wt Readings from Last 3 Encounters:  06/27/19 225 lb (102.1 kg)  02/15/19 228 lb (103.4 kg)  07/31/18 220 lb 12.8 oz (100.2 kg)       ASSESSMENT & PLAN:     1.  Paroxysmal atrial fibrillation His ILR is at EOL.  insurance would not pay to have a new device placed. He continues to have afib.episodes I have advised prn metoprolol and flecainide Therapeutic strategies for afib including medicine and ablation were discussed in detail with the patient today. Risk, benefits, and alternatives to EP study and radiofrequency ablation for afib were also discussed in detail today. These risks include but are not limited to stroke, bleeding, vascular damage, tamponade, perforation, damage to the esophagus, lungs, and other structures, pulmonary vein stenosis, worsening renal function, and death. The patient  understands these risk and wishes to proceed.  We will therefore proceed with catheter ablation at the next available time.  Carto, ICE, anesthesia are requested for the procedure.  Will also obtain cardiac CT prior to the procedure to exclude LAA thrombus and further evaluate atrial anatomy.  2. HTN Stable No change required today  3. Bradycardia asymptomatic    Patient Risk:  after full review of this patients clinical status, I feel that they are at high risk at this time.  Today, I have spent 15 minutes with the patient with telehealth technology discussing arrhythmia management .    Army Fossa, MD  06/27/2019 4:06 PM     [place] [address] [phone] (office) [phone] (fax)

## 2019-06-27 NOTE — Progress Notes (Signed)
Electrophysiology TeleHealth Note   Due to national recommendations of social distancing due to COVID 19, an audio/video telehealth visit is felt to be most appropriate for this patient at this time.  See MyChart message from today for the patient's consent to telehealth for [facility].  Date:  06/27/2019   ID:  Jorge Peterson, DOB 1960/12/25, MRN [id number]  Location: patient's home  Provider location:  [facility]  Evaluation Performed: Follow-up visit  PCP:  [name], MD   Electrophysiologist:  Dr Rayann Heman  Chief Complaint:  palpitations  History of Present Illness:    Jorge Peterson is a 58 y.o. male who presents via telehealth conferencing today.  Since last being seen in our clinic, the patient reports doing very well.  He has occasional afib episodes.  Today, he denies symptoms of chest pain, shortness of breath,  lower extremity edema, dizziness, presyncope, or syncope.  The patient is otherwise without complaint today.  The patient denies symptoms of fevers, chills, cough, or new SOB worrisome for COVID 19.  Past Medical History:  Diagnosis Date  . Atrial fibrillation ([place])    s/p afib ablation 7/12  . Bradycardia    asymptomatic  . Colonic diverticular abscess   . Diabetes ([place])   . Diverticulitis   . Dysrhythmia   . Elevated LFTs   . Fall   . Family history of anesthesia complication    father gets pneumonia after "  . GERD (gastroesophageal reflux disease)   . Hyperlipidemia   . Hyperplastic colon polyp   . Hypertension   . Internal hemorrhoids   . Tinnitus   . Tubular adenoma of colon   . Wears glasses     Past Surgical History:  Procedure Laterality Date  . ATRIAL ABLATION SURGERY  03/24/11   PVI by Greggory Brandy  . BACK SURGERY    . BRONCHOSCOPY  01/23/2013    biopsy  . CHOLECYSTECTOMY  05/02/2014   LAP CHOLE  . CHOLECYSTECTOMY N/A 05/02/2014   Procedure: LAPAROSCOPIC CHOLECYSTECTOMY WITH INTRAOPERATIVE CHOLANGIOGRAM;  Surgeon: Erroll Luna, MD;  Location: [place];  Service: General;  Laterality: N/A;  . EP IMPLANTABLE DEVICE N/A 06/27/2015   Procedure: Loop Recorder Insertion;  Surgeon: [name], MD;  Location: [place] [facility];  Service: Cardiovascular;  Laterality: N/A;  . ERCP N/A 05/25/2014   Procedure: ENDOSCOPIC RETROGRADE CHOLANGIOPANCREATOGRAPHY (ERCP);  Surgeon: [name], MD;  Location: [place];  Service: Endoscopy;  Laterality: N/A;  . Fistula surgery    . MEDIASTINOSCOPY N/A 01/23/2013   Procedure: MEDIASTINOSCOPY;  Surgeon: Ivin Poot, MD;  Location: [place];  Service: Thoracic;  Laterality: N/A;  . QUADRICEPS TENDON REPAIR Right 01/13/2016   Procedure: REPAIR QUADRICEP TENDON;  Surgeon: Latanya Maudlin, MD;  [facility];  Service: Orthopedics;  Laterality: Right;  Received femoral nerve block in holding  . SHOULDER SURGERY     x 2  . TONSILLECTOMY    . VIDEO BRONCHOSCOPY WITH ENDOBRONCHIAL ULTRASOUND N/A 01/23/2013   Procedure: VIDEO BRONCHOSCOPY WITH ENDOBRONCHIAL ULTRASOUND;  Surgeon: Ivin Poot, MD;  Location: [facility];  Service: Thoracic;  Laterality: N/A;    Current Outpatient Medications  Medication Sig Dispense Refill  . acetaminophen (TYLENOL) 500 MG tablet Take 2 tablets (1,000 mg total) by mouth every 6 (six) hours as needed. 30 tablet 0  . BIOTIN PO Take 7,500 mcg by mouth daily.    . Coenzyme Q10 (COQ10) 150 MG CAPS Take 1 capsule by mouth  daily at 10 pm.     . ELIQUIS 5 MG TABS tablet TAKE 1 TABLET BY MOUTH TWICE A DAY 60 tablet 6  . fluticasone (FLONASE) 50 MCG/ACT nasal spray Place 1 spray into the nose daily as needed for allergies.     . folic acid (FOLVITE) A999333 MCG tablet Take 400 mcg by mouth daily at 6 (six) AM.     . hydrochlorothiazide (HYDRODIURIL) 12.5 MG tablet Take 12.5 mg by mouth daily.  3  . losartan (COZAAR) 100 MG tablet Take 100 mg by mouth daily at 10 pm.  3  . metFORMIN (GLUCOPHAGE-XR) 500 MG 24 hr tablet Take 500 mg by mouth daily at 6 PM.     .  Multiple Vitamin (MULTIVITAMIN WITH MINERALS) TABS tablet Take 1 tablet by mouth daily. Centrum Silver    . Omega-3 Fatty Acids (FISH OIL) 1200 MG CAPS Take 1,200-2,400 mg by mouth See admin instructions. Take 2 capsules (2400 mg) by mouth in the morning & take 1 capsule (1200 mg) by mouth at night.    [name] omeprazole (PRILOSEC) 40 MG capsule TAKE 1 CAPSULE (40 MG TOTAL) BY MOUTH DAILY. NEEDS OFFICE VISIT FOR FURTHER REFILLS 90 capsule 0  . Probiotic Product (PROBIOTIC PO) Take 1 capsule by mouth 2 (two) times daily.    . simvastatin (ZOCOR) 20 MG tablet TAKE 1 TABLET BY MOUTH EVERYDAY AT BEDTIME 90 tablet 2  . traMADol (ULTRAM) 50 MG tablet Take 1 tablet (50 mg total) by mouth every 6 (six) hours as needed (mild pain). 20 tablet 0   No current facility-administered medications for this visit.     Allergies:   Codeine, Latex, and Oxycodone-acetaminophen   Social History:  The patient  reports that he has never smoked. He has never used smokeless tobacco. He reports that he does not drink alcohol or use drugs.   Family History:  The patient's family history includes Diabetes in his father; Heart disease in his father; Liver disease in his mother.   ROS:  Please see the history of present illness.   All other systems are personally reviewed and negative.    Exam:    Vital Signs:  BP 123/82   Pulse (!) 52   Ht 5\' 7"  (1.702 m)   Wt 225 lb (102.1 kg)   BMI 35.24 kg/m   Well sounding and appearing, alert and conversant, regular work of breathing,  good skin color Eyes- anicteric, neuro- grossly intact, skin- no apparent rash or lesions or cyanosis, mouth- oral mucosa is pink  Labs/Other Tests and Data Reviewed:    Recent Labs: 07/14/2018: BUN 17; Creatinine, Ser 1.13; Hemoglobin 12.9; Platelets 182; Potassium 3.9; Sodium 141   Wt Readings from Last 3 Encounters:  06/27/19 225 lb (102.1 kg)  02/15/19 228 lb (103.4 kg)  07/31/18 220 lb 12.8 oz (100.2 kg)       ASSESSMENT & PLAN:     1.  Paroxysmal atrial fibrillation His ILR is at EOL.  insurance would not pay to have a new device placed. He continues to have afib.episodes I have advised prn metoprolol and flecainide Therapeutic strategies for afib including medicine and ablation were discussed in detail with the patient today. Risk, benefits, and alternatives to EP study and radiofrequency ablation for afib were also discussed in detail today. These risks include but are not limited to stroke, bleeding, vascular damage, tamponade, perforation, damage to the esophagus, lungs, and other structures, pulmonary vein stenosis, worsening renal function, and death. The patient  understands these risk and wishes to proceed.  We will therefore proceed with catheter ablation at the next available time.  Carto, ICE, anesthesia are requested for the procedure.  Will also obtain cardiac CT prior to the procedure to exclude LAA thrombus and further evaluate atrial anatomy.  2. HTN Stable No change required today  3. Bradycardia asymptomatic    Patient Risk:  after full review of this patients clinical status, I feel that they are at high risk at this time.  Today, I have spent 15 minutes with the patient with telehealth technology discussing arrhythmia management .    Army Fossa, MD  06/27/2019 4:06 PM     [place] [address] [phone] (office) [phone] (fax)

## 2019-06-28 ENCOUNTER — Telehealth: Payer: Self-pay

## 2019-06-28 DIAGNOSIS — I480 Paroxysmal atrial fibrillation: Secondary | ICD-10-CM

## 2019-06-28 NOTE — Telephone Encounter (Signed)
Jorge Grayer, MD sent to Damian Leavell, RN        Afib ablation  C/I/A   Cardiac CT    Pt would like loop removed at same time as ablation

## 2019-07-03 MED ORDER — METOPROLOL TARTRATE 100 MG PO TABS: ORAL_TABLET | ORAL | 0 refills | Status: DC

## 2019-07-03 MED ORDER — METOPROLOL TARTRATE 100 MG PO TABS: ORAL_TABLET | ORAL | 3 refills | Status: DC

## 2019-07-04 NOTE — Telephone Encounter (Signed)
Work up complete. 

## 2019-07-16 ENCOUNTER — Telehealth (HOSPITAL_COMMUNITY): Payer: Self-pay | Admitting: Emergency Medicine

## 2019-07-16 NOTE — Telephone Encounter (Signed)
Left message on voicemail with name and callback number Sharelle Burditt RN Navigator Cardiac Imaging [facility] [phone] Office [phone] Cell  

## 2019-07-17 ENCOUNTER — Other Ambulatory Visit: Payer: Self-pay

## 2019-07-17 ENCOUNTER — Ambulatory Visit (HOSPITAL_COMMUNITY)
Admission: RE | Admit: 2019-07-17 | Discharge: 2019-07-17 | Disposition: A | Discharge status: Home / Self Care | Payer: 59 | Source: Ambulatory Visit | Attending: Internal Medicine | Admitting: Internal Medicine

## 2019-07-17 DIAGNOSIS — I480 Paroxysmal atrial fibrillation: Secondary | ICD-10-CM

## 2019-07-17 MED ORDER — IOHEXOL 350 MG/ML SOLN
100.0000 mL | Freq: Once | INTRAVENOUS | Status: AC | PRN
  Administered 2019-07-17: 100 mL via INTRAVENOUS

## 2019-07-20 ENCOUNTER — Other Ambulatory Visit
Admission: RE | Admit: 2019-07-20 | Discharge: 2019-07-20 | Disposition: A | Discharge status: Home / Self Care | Payer: 59 | Source: Ambulatory Visit | Attending: Internal Medicine | Admitting: Internal Medicine

## 2019-07-20 ENCOUNTER — Other Ambulatory Visit: Payer: Self-pay

## 2019-07-20 DIAGNOSIS — Z20828 Contact with and (suspected) exposure to other viral communicable diseases: Secondary | ICD-10-CM | POA: Diagnosis not present

## 2019-07-20 DIAGNOSIS — I480 Paroxysmal atrial fibrillation: Secondary | ICD-10-CM | POA: Diagnosis present

## 2019-07-20 LAB — CBC WITH DIFFERENTIAL/PLATELET
Abs Immature Granulocytes: 0.04 10*3/uL (ref 0.00–0.07)
Basophils Absolute: 0 10*3/uL (ref 0.0–0.1)
Basophils Relative: 0 %
Eosinophils Absolute: 0.1 10*3/uL (ref 0.0–0.5)
Eosinophils Relative: 1 %
HCT: 41.5 % (ref 39.0–52.0)
Hemoglobin: 14.2 g/dL (ref 13.0–17.0)
Immature Granulocytes: 1 %
Lymphocytes Relative: 29 %
Lymphs Abs: 2.1 10*3/uL (ref 0.7–4.0)
MCH: 27.5 pg (ref 26.0–34.0)
MCHC: 34.2 g/dL (ref 30.0–36.0)
MCV: 80.3 fL (ref 80.0–100.0)
Monocytes Absolute: 0.5 10*3/uL (ref 0.1–1.0)
Monocytes Relative: 7 %
Neutro Abs: 4.5 10*3/uL (ref 1.7–7.7)
Neutrophils Relative %: 62 %
Platelets: 191 10*3/uL (ref 150–400)
RBC: 5.17 MIL/uL (ref 4.22–5.81)
RDW: 12.6 % (ref 11.5–15.5)
WBC: 7.3 10*3/uL (ref 4.0–10.5)
nRBC: 0 % (ref 0.0–0.2)

## 2019-07-20 LAB — BASIC METABOLIC PANEL
Anion gap: 11 (ref 5–15)
BUN: 21 mg/dL — ABNORMAL HIGH (ref 6–20)
CO2: 24 mmol/L (ref 22–32)
Calcium: 9.3 mg/dL (ref 8.9–10.3)
Chloride: 105 mmol/L (ref 98–111)
Creatinine, Ser: 1.07 mg/dL (ref 0.61–1.24)
GFR calc Af Amer: >60 mL/min (ref >60)
GFR calc non Af Amer: >60 mL/min (ref >60)
Glucose, Bld: 148 mg/dL — ABNORMAL HIGH (ref 70–99)
Potassium: 3.9 mmol/L (ref 3.5–5.1)
Sodium: 140 mmol/L (ref 135–145)

## 2019-07-21 LAB — SARS CORONAVIRUS 2 (TAT 6-24 HRS): SARS Coronavirus 2: NEGATIVE

## 2019-07-24 ENCOUNTER — Ambulatory Visit (HOSPITAL_COMMUNITY): Payer: 59 | Admitting: Anesthesiology

## 2019-07-24 ENCOUNTER — Encounter (HOSPITAL_COMMUNITY): Payer: Self-pay | Admitting: Anesthesiology

## 2019-07-24 ENCOUNTER — Encounter (HOSPITAL_COMMUNITY)
Admission: RE | Disposition: A | Discharge status: Home / Self Care | Payer: 59 | Source: Home / Self Care | Attending: Internal Medicine

## 2019-07-24 ENCOUNTER — Ambulatory Visit (HOSPITAL_COMMUNITY)
Admission: RE | Admit: 2019-07-24 | Discharge: 2019-07-24 | Disposition: A | Discharge status: Home / Self Care | Payer: 59 | Attending: Internal Medicine | Admitting: Internal Medicine

## 2019-07-24 ENCOUNTER — Other Ambulatory Visit: Payer: Self-pay

## 2019-07-24 DIAGNOSIS — R001 Bradycardia, unspecified: Secondary | ICD-10-CM | POA: Diagnosis not present

## 2019-07-24 DIAGNOSIS — I480 Paroxysmal atrial fibrillation: Secondary | ICD-10-CM | POA: Insufficient documentation

## 2019-07-24 DIAGNOSIS — Z885 Allergy status to narcotic agent status: Secondary | ICD-10-CM | POA: Diagnosis not present

## 2019-07-24 DIAGNOSIS — E119 Type 2 diabetes mellitus without complications: Secondary | ICD-10-CM | POA: Insufficient documentation

## 2019-07-24 DIAGNOSIS — I10 Essential (primary) hypertension: Secondary | ICD-10-CM | POA: Insufficient documentation

## 2019-07-24 DIAGNOSIS — K219 Gastro-esophageal reflux disease without esophagitis: Secondary | ICD-10-CM | POA: Insufficient documentation

## 2019-07-24 DIAGNOSIS — Z7901 Long term (current) use of anticoagulants: Secondary | ICD-10-CM | POA: Diagnosis not present

## 2019-07-24 DIAGNOSIS — E785 Hyperlipidemia, unspecified: Secondary | ICD-10-CM | POA: Insufficient documentation

## 2019-07-24 DIAGNOSIS — Z7984 Long term (current) use of oral hypoglycemic drugs: Secondary | ICD-10-CM | POA: Insufficient documentation

## 2019-07-24 DIAGNOSIS — Z79899 Other long term (current) drug therapy: Secondary | ICD-10-CM | POA: Insufficient documentation

## 2019-07-24 HISTORY — PX: ATRIAL FIBRILLATION ABLATION: EP1191

## 2019-07-24 HISTORY — PX: LOOP RECORDER REMOVAL: EP1215

## 2019-07-24 LAB — GLUCOSE, CAPILLARY
Glucose-Capillary: 130 mg/dL — ABNORMAL HIGH (ref 70–99)
Glucose-Capillary: 192 mg/dL — ABNORMAL HIGH (ref 70–99)
Glucose-Capillary: 205 mg/dL — ABNORMAL HIGH (ref 70–99)

## 2019-07-24 LAB — POCT ACTIVATED CLOTTING TIME: Activated Clotting Time: 246 seconds

## 2019-07-24 SURGERY — ATRIAL FIBRILLATION ABLATION
Anesthesia: General

## 2019-07-24 MED ORDER — PHENYLEPHRINE 40 MCG/ML (10ML) SYRINGE FOR IV PUSH (FOR BLOOD PRESSURE SUPPORT)
PREFILLED_SYRINGE | INTRAVENOUS | Status: DC | PRN
  Administered 2019-07-24 (×4): 80 ug via INTRAVENOUS

## 2019-07-24 MED ORDER — ROCURONIUM BROMIDE 10 MG/ML (PF) SYRINGE
PREFILLED_SYRINGE | INTRAVENOUS | Status: DC | PRN
  Administered 2019-07-24: 60 mg via INTRAVENOUS
  Administered 2019-07-24: 10 mg via INTRAVENOUS
  Administered 2019-07-24: 30 mg via INTRAVENOUS

## 2019-07-24 MED ORDER — APIXABAN 5 MG PO TABS
5.0000 mg | ORAL_TABLET | ORAL | Status: AC
  Administered 2019-07-24: 5 mg via ORAL
  Filled 2019-07-24: qty 1

## 2019-07-24 MED ORDER — LIDOCAINE-EPINEPHRINE 1 %-1:100000 IJ SOLN
INTRAMUSCULAR | Status: AC
  Filled 2019-07-24: qty 1

## 2019-07-24 MED ORDER — BUPIVACAINE HCL (PF) 0.25 % IJ SOLN
INTRAMUSCULAR | Status: AC
  Filled 2019-07-24: qty 30

## 2019-07-24 MED ORDER — PROTAMINE SULFATE 10 MG/ML IV SOLN
INTRAVENOUS | Status: DC | PRN
  Administered 2019-07-24: 10 mg via INTRAVENOUS
  Administered 2019-07-24: 20 mg via INTRAVENOUS
  Administered 2019-07-24: 10 mg via INTRAVENOUS

## 2019-07-24 MED ORDER — HEPARIN (PORCINE) IN NACL 1000-0.9 UT/500ML-% IV SOLN
INTRAVENOUS | Status: AC
  Filled 2019-07-24: qty 500

## 2019-07-24 MED ORDER — DEXAMETHASONE SODIUM PHOSPHATE 10 MG/ML IJ SOLN
INTRAMUSCULAR | Status: DC | PRN
  Administered 2019-07-24: 10 mg via INTRAVENOUS

## 2019-07-24 MED ORDER — PROPOFOL 10 MG/ML IV BOLUS
INTRAVENOUS | Status: DC | PRN
  Administered 2019-07-24: 30 mg via INTRAVENOUS
  Administered 2019-07-24: 170 mg via INTRAVENOUS

## 2019-07-24 MED ORDER — HEPARIN SODIUM (PORCINE) 1000 UNIT/ML IJ SOLN
INTRAMUSCULAR | Status: DC | PRN
  Administered 2019-07-24: 1000 [IU] via INTRAVENOUS
  Administered 2019-07-24: 14000 [IU] via INTRAVENOUS

## 2019-07-24 MED ORDER — BUPIVACAINE HCL (PF) 0.25 % IJ SOLN
INTRAMUSCULAR | Status: DC | PRN
  Administered 2019-07-24: 20 mL

## 2019-07-24 MED ORDER — HEPARIN SODIUM (PORCINE) 1000 UNIT/ML IJ SOLN
INTRAMUSCULAR | Status: AC
  Filled 2019-07-24: qty 2

## 2019-07-24 MED ORDER — LIDOCAINE-EPINEPHRINE 1 %-1:100000 IJ SOLN
INTRAMUSCULAR | Status: DC | PRN
  Administered 2019-07-24: 10 mL

## 2019-07-24 MED ORDER — ISOPROTERENOL HCL 0.2 MG/ML IJ SOLN
INTRAMUSCULAR | Status: AC
  Filled 2019-07-24: qty 5

## 2019-07-24 MED ORDER — ISOPROTERENOL HCL 0.2 MG/ML IJ SOLN
INTRAVENOUS | Status: DC | PRN
  Administered 2019-07-24: 20 ug/min via INTRAVENOUS

## 2019-07-24 MED ORDER — ACETAMINOPHEN 325 MG PO TABS
ORAL_TABLET | ORAL | Status: AC
  Filled 2019-07-24: qty 2

## 2019-07-24 MED ORDER — HEPARIN SODIUM (PORCINE) 1000 UNIT/ML IJ SOLN
INTRAMUSCULAR | Status: DC | PRN
  Administered 2019-07-24: 5000 [IU] via INTRAVENOUS

## 2019-07-24 MED ORDER — FENTANYL CITRATE (PF) 100 MCG/2ML IJ SOLN
INTRAMUSCULAR | Status: DC | PRN
  Administered 2019-07-24: 100 ug via INTRAVENOUS

## 2019-07-24 MED ORDER — PANTOPRAZOLE SODIUM 40 MG PO TBEC: 40.0000 mg | DELAYED_RELEASE_TABLET | Freq: Every day | ORAL | 0 refills | Status: DC

## 2019-07-24 MED ORDER — SODIUM CHLORIDE 0.9% FLUSH: 3.0000 mL | Freq: Two times a day (BID) | INTRAVENOUS | Status: DC

## 2019-07-24 MED ORDER — ACETAMINOPHEN 325 MG PO TABS
650.0000 mg | ORAL_TABLET | ORAL | Status: DC | PRN
  Administered 2019-07-24: 650 mg via ORAL

## 2019-07-24 MED ORDER — ONDANSETRON HCL 4 MG/2ML IJ SOLN
INTRAMUSCULAR | Status: DC | PRN
  Administered 2019-07-24: 4 mg via INTRAVENOUS

## 2019-07-24 MED ORDER — ISOPROTERENOL HCL 0.2 MG/ML IJ SOLN: INTRAVENOUS | Status: DC | PRN

## 2019-07-24 MED ORDER — SODIUM CHLORIDE 0.9 % IV SOLN
INTRAVENOUS | Status: DC
  Administered 2019-07-24: 06:00:00 via INTRAVENOUS

## 2019-07-24 MED ORDER — MIDAZOLAM HCL 5 MG/5ML IJ SOLN
INTRAMUSCULAR | Status: DC | PRN
  Administered 2019-07-24: 2 mg via INTRAVENOUS

## 2019-07-24 MED ORDER — LIDOCAINE 2% (20 MG/ML) 5 ML SYRINGE
INTRAMUSCULAR | Status: DC | PRN
  Administered 2019-07-24: 100 mg via INTRAVENOUS

## 2019-07-24 MED ORDER — EPHEDRINE SULFATE-NACL 50-0.9 MG/10ML-% IV SOSY
PREFILLED_SYRINGE | INTRAVENOUS | Status: DC | PRN
  Administered 2019-07-24: 10 mg via INTRAVENOUS

## 2019-07-24 MED ORDER — SODIUM CHLORIDE 0.9 % IV SOLN: 250.0000 mL | INTRAVENOUS | Status: DC | PRN

## 2019-07-24 MED ORDER — HEPARIN (PORCINE) IN NACL 1000-0.9 UT/500ML-% IV SOLN
INTRAVENOUS | Status: DC | PRN
  Administered 2019-07-24: 500 mL

## 2019-07-24 MED ORDER — SUGAMMADEX SODIUM 200 MG/2ML IV SOLN
INTRAVENOUS | Status: DC | PRN
  Administered 2019-07-24: 200 mg via INTRAVENOUS

## 2019-07-24 SURGICAL SUPPLY — 21 items
BLANKET WARM UNDERBOD FULL ACC (MISCELLANEOUS) ×2 IMPLANT
CATH MAPPNG PENTARAY F 2-6-2MM (CATHETERS) IMPLANT
CATH SMTCH THERMOCOOL SF DF (CATHETERS) ×2 IMPLANT
CATH SOUNDSTAR 3D IMAGING (CATHETERS) ×2 IMPLANT
CATH WEBSTER BI DIR CS D-F CRV (CATHETERS) ×1 IMPLANT
COVER SWIFTLINK CONNECTOR (BAG) ×2 IMPLANT
DEVICE CLOSURE PERCLS PRGLD 6F (VASCULAR PRODUCTS) IMPLANT
NDL BAYLIS TRANSSEPTAL 71CM (NEEDLE) IMPLANT
NEEDLE BAYLIS TRANSSEPTAL 71CM (NEEDLE) ×2 IMPLANT
PACK EP LATEX FREE (CUSTOM PROCEDURE TRAY) ×2
PACK EP LF (CUSTOM PROCEDURE TRAY) ×1 IMPLANT
PACK LOOP INSERTION (CUSTOM PROCEDURE TRAY) ×2 IMPLANT
PAD PRO RADIOLUCENT 2001M-C (PAD) ×2 IMPLANT
PATCH CARTO3 (PAD) ×1 IMPLANT
PENTARAY F 2-6-2MM (CATHETERS) ×2
PERCLOSE PROGLIDE 6F (VASCULAR PRODUCTS) ×6
SHEATH AVANTI 11F 11CM (SHEATH) ×2 IMPLANT
SHEATH PINNACLE 7F 10CM (SHEATH) ×2 IMPLANT
SHEATH PROBE COVER 6X72 (BAG) ×1 IMPLANT
SHEATH SWARTZ TS SL2 63CM 8.5F (SHEATH) ×1 IMPLANT
TUBING SMART ABLATE COOLFLOW (TUBING) ×1 IMPLANT

## 2019-07-24 NOTE — Anesthesia Postprocedure Evaluation (Signed)
Anesthesia Post Note  Patient: Jorge Peterson  Procedure(s) Performed: ATRIAL FIBRILLATION ABLATION (N/A ) LOOP RECORDER REMOVAL (N/A )     Patient location during evaluation: PACU Anesthesia Type: General Level of consciousness: awake and alert Pain management: pain level controlled Vital Signs Assessment: post-procedure vital signs reviewed and stable Respiratory status: spontaneous breathing, nonlabored ventilation, respiratory function stable and patient connected to nasal cannula oxygen Cardiovascular status: blood pressure returned to baseline and stable Postop Assessment: no apparent nausea or vomiting Anesthetic complications: no    Last Vitals:  Vitals:   07/24/19 0552  BP: 130/84  Pulse: (!) 56  Resp: 18  Temp: (!) 36.2 C  SpO2: 100%    Last Pain:  Vitals:   07/24/19 0620  TempSrc:   PainSc: 0-No pain                 [name] S

## 2019-07-24 NOTE — Interval H&P Note (Signed)
History and Physical Interval Note:  07/24/2019 7:26 AM  Jorge Peterson  has presented today for surgery, with the diagnosis of atrial fibrillation.  The various methods of treatment have been discussed with the patient and family. After consideration of risks, benefits and other options for treatment, the patient has consented to  Procedure(s): ATRIAL FIBRILLATION ABLATION (N/A) LOOP RECORDER REMOVAL (N/A) as a surgical intervention.  The patient's history has been reviewed, patient examined, no change in status, stable for surgery.  I have reviewed the patient's chart and labs.  Questions were answered to the patient's satisfaction.    Cardiac CT reviewed with patient today He reports compliance with eliquis without interruption.  [name]

## 2019-07-24 NOTE — Anesthesia Procedure Notes (Signed)
Procedure Name: Intubation Date/Time: 07/24/2019 7:53 AM Performed by: Jenne Campus, CRNA Pre-anesthesia Checklist: Patient identified, Emergency Drugs available, Suction available and Patient being monitored Patient Re-evaluated:Patient Re-evaluated prior to induction Oxygen Delivery Method: Circle System Utilized Preoxygenation: Pre-oxygenation with 100% oxygen Induction Type: IV induction Ventilation: Mask ventilation without difficulty and Two handed mask ventilation required Laryngoscope Size: Miller and 3 Grade View: Grade I Tube type: Oral Tube size: 7.5 mm Number of attempts: 1 Airway Equipment and Method: Stylet Placement Confirmation: ETT inserted through vocal cords under direct vision,  positive ETCO2 and breath sounds checked- equal and bilateral Secured at: 21 cm Tube secured with: Tape Dental Injury: Teeth and Oropharynx as per pre-operative assessment

## 2019-07-24 NOTE — Discharge Instructions (Signed)
Chest (loop removal) site/wound care instructions Keep incision clean and dry for 3 days (no showers). You can remove outer dressing in 3 days, and shower 07/27/2019 leave steri strips on for 10 days. Call the office [phone]) for redness, drainage, swelling, or fever.   Post ablation care instructions No driving for 4 days. No lifting over 5 lbs for 1 week. No vigorous or sexual activity for 1 week. You may return to work/usual activities on 07/31/2019. Keep procedure site clean & dry. If you notice increased pain, swelling, bleeding or pus, call/return!  You may shower, but no soaking baths/hot tubs/pools for 1 week.   You have an appointment set up with the [facility].  Multiple studies have shown that being followed by a dedicated atrial fibrillation clinic in addition to the standard care you receive from your other physicians improves health. We believe that enrollment in the atrial fibrillation clinic will allow [place] to better care for you.   The phone number to the [facility] is [phone]. The clinic is staffed Monday through Friday from 8:30am to 5pm.  Parking Directions: The clinic is located in the Heart and Vascular Building connected to [facility]. 1)From [address] turn on to [address] and go to the 3rd entrance  (Heart and Vascular entrance) on the right. 2)Look to the right for Heart &Vascular Parking Garage. 3) the code for the entrance is 7009 in December.   4)Take the elevators to the 1st floor. Registration is in the room with the glass walls at the end of the hallway.  If you have any trouble parking or locating the clinic, please dont hesitate to call [phone].       Femoral Site Care This sheet gives you information about how to care for yourself after your procedure. Your health care provider may also give you more specific instructions. If you have problems or questions, contact your health care  provider. What can I expect after the procedure? After the procedure, it is common to have:  Bruising that usually fades within 1-2 weeks.  Tenderness at the site. Follow these instructions at home: Wound care  Follow instructions from your health care provider about how to take care of your insertion site. Make sure you: ? Wash your hands with soap and water before you change your bandage (dressing). If soap and water are not available, use hand sanitizer. ? Change your dressing as told by your health care provider. ? Leave stitches (sutures), skin glue, or adhesive strips in place. These skin closures may need to stay in place for 2 weeks or longer. If adhesive strip edges start to loosen and curl up, you may trim the loose edges. Do not remove adhesive strips completely unless your health care provider tells you to do that.  Do not take baths, swim, or use a hot tub until your health care provider approves.  You may shower 24-48 hours after the procedure or as told by your health care provider. ? Gently wash the site with plain soap and water. ? Pat the area dry with a clean towel. ? Do not rub the site. This may cause bleeding.  Do not apply powder or lotion to the site. Keep the site clean and dry.  Check your femoral site every day for signs of infection. Check for: ? Redness, swelling, or pain. ? Fluid or blood. ? Warmth. ? Pus or a bad smell. Activity  For the first 2-3 days after your procedure,  or as long as directed: ? Avoid climbing stairs as much as possible. ? Do not squat.  Do not lift anything that is heavier than 10 lb (4.5 kg), or the limit that you are told, until your health care provider says that it is safe.  Rest as directed. ? Avoid sitting for a long time without moving. Get up to take short walks every 1-2 hours.  Do not drive for 24 hours if you were given a medicine to help you relax (sedative). General instructions  Take over-the-counter and  prescription medicines only as told by your health care provider.  Keep all follow-up visits as told by your health care provider. This is important. Contact a health care provider if you have:  A fever or chills.  You have redness, swelling, or pain around your insertion site. Get help right away if:  The catheter insertion area swells very fast.  You pass out.  You suddenly start to sweat or your skin gets clammy.  The catheter insertion area is bleeding, and the bleeding does not stop when you hold steady pressure on the area.  The area near or just beyond the catheter insertion site becomes pale, cool, tingly, or numb. These symptoms may represent a serious problem that is an emergency. Do not wait to see if the symptoms will go away. Get medical help right away. Call your local emergency services (911 in the [place] Do not drive yourself to the hospital. Summary  After the procedure, it is common to have bruising that usually fades within 1-2 weeks.  Check your femoral site every day for signs of infection.  Do not lift anything that is heavier than 10 lb (4.5 kg), or the limit that you are told, until your health care provider says that it is safe. This information is not intended to replace advice given to you by your health care provider. Make sure you discuss any questions you have with your health care provider. Document Released: 04/19/2014 Document Revised: 08/29/2017 Document Reviewed: 08/29/2017 Elsevier Patient Education  2020 [employer].

## 2019-07-24 NOTE — Anesthesia Preprocedure Evaluation (Signed)
Anesthesia Evaluation  Patient identified by MRN, date of birth, ID band Patient awake    Reviewed: Allergy & Precautions, NPO status , Patient's Chart, lab work & pertinent test results  Airway Mallampati: II  TM Distance: >3 FB Neck ROM: Full    Dental no notable dental hx.    Pulmonary neg pulmonary ROS,    Pulmonary exam normal breath sounds clear to auscultation       Cardiovascular hypertension, Normal cardiovascular exam+ dysrhythmias Atrial Fibrillation  Rhythm:Regular Rate:Normal     Neuro/Psych negative neurological ROS  negative psych ROS   GI/Hepatic negative GI ROS, Neg liver ROS,   Endo/Other  diabetes, Type 2  Renal/GU negative Renal ROS  negative genitourinary   Musculoskeletal negative musculoskeletal ROS (+)   Abdominal   Peds negative pediatric ROS (+)  Hematology negative hematology ROS (+)   Anesthesia Other Findings   Reproductive/Obstetrics negative OB ROS                             Anesthesia Physical Anesthesia Plan  ASA: III  Anesthesia Plan: General   Post-op Pain Management:    Induction: Intravenous  PONV Risk Score and Plan: 2 and Ondansetron, Dexamethasone and Treatment may vary due to age or medical condition  Airway Management Planned: Oral ETT  Additional Equipment:   Intra-op Plan:   Post-operative Plan: Extubation in OR  Informed Consent: I have reviewed the patients History and Physical, chart, labs and discussed the procedure including the risks, benefits and alternatives for the proposed anesthesia with the patient or authorized representative who has indicated his/her understanding and acceptance.     Dental advisory given  Plan Discussed with: CRNA and Surgeon  Anesthesia Plan Comments:         Anesthesia Quick Evaluation

## 2019-07-24 NOTE — Transfer of Care (Signed)
Immediate Anesthesia Transfer of Care Note  Patient: Jorge Peterson  Procedure(s) Performed: ATRIAL FIBRILLATION ABLATION (N/A ) LOOP RECORDER REMOVAL (N/A )  Patient Location: Cath Lab  Anesthesia Type:General  Level of Consciousness: awake, oriented and patient cooperative  Airway & Oxygen Therapy: Patient Spontanous Breathing and Patient connected to face mask oxygen  Post-op Assessment: Report given to RN and Post -op Vital signs reviewed and stable  Post vital signs: Reviewed  Last Vitals:  Vitals Value Taken Time  BP 143/65 07/24/19 1023  Temp    Pulse 71 07/24/19 1023  Resp 18 07/24/19 1023  SpO2 96 % 07/24/19 1023  Vitals shown include unvalidated device data.  Last Pain:  Vitals:   07/24/19 0620  TempSrc:   PainSc: 0-No pain         Complications: No apparent anesthesia complications

## 2019-07-25 ENCOUNTER — Encounter: Payer: Self-pay | Admitting: Cardiology

## 2019-07-25 ENCOUNTER — Encounter (HOSPITAL_COMMUNITY): Payer: Self-pay | Admitting: Internal Medicine

## 2019-07-25 NOTE — Telephone Encounter (Signed)
This encounter was created in error - please disregard.

## 2019-08-07 ENCOUNTER — Ambulatory Visit: Payer: 59

## 2019-08-14 ENCOUNTER — Telehealth: Payer: Self-pay | Admitting: *Deleted

## 2019-08-14 MED ORDER — FUROSEMIDE 40 MG PO TABS: 40.0000 mg | ORAL_TABLET | Freq: Every day | ORAL | 0 refills | Status: DC

## 2019-08-14 NOTE — Telephone Encounter (Signed)
I called the patient in response to my chart message and informed of Dr. Jackalyn Lombard reply/recommendation. He will take lasix for 3 days and keep the afib clinic appointment.   Pt asked about cxr showing enlarged cardiac silhouette and basilar atelectasis.  I encouarged him to do deep breathing exercises and take the lasix.    For past couple days has noticed a pain with cough and inspiration in left lower chest and back.  Started Symbicort yesterday. Today notes cough is "markedly worse"  Still non productive.  No fevers.  States feels fine other than cough and now this mild pain with inspiration.

## 2019-08-15 ENCOUNTER — Other Ambulatory Visit: Payer: Self-pay | Admitting: Internal Medicine

## 2019-08-21 ENCOUNTER — Encounter (HOSPITAL_COMMUNITY): Payer: Self-pay | Admitting: Nurse Practitioner

## 2019-08-21 ENCOUNTER — Other Ambulatory Visit: Payer: Self-pay

## 2019-08-21 ENCOUNTER — Encounter (HOSPITAL_COMMUNITY): Payer: Self-pay

## 2019-08-21 ENCOUNTER — Ambulatory Visit (HOSPITAL_COMMUNITY)
Admission: RE | Admit: 2019-08-21 | Discharge: 2019-08-21 | Disposition: A | Discharge status: Home / Self Care | Payer: 59 | Source: Ambulatory Visit | Attending: Nurse Practitioner | Admitting: Nurse Practitioner

## 2019-08-21 VITALS — BP 140/84 | HR 63 | Ht 67.0 in | Wt 237.2 lb

## 2019-08-21 DIAGNOSIS — Z79899 Other long term (current) drug therapy: Secondary | ICD-10-CM | POA: Insufficient documentation

## 2019-08-21 DIAGNOSIS — R05 Cough: Secondary | ICD-10-CM | POA: Insufficient documentation

## 2019-08-21 DIAGNOSIS — Z8249 Family history of ischemic heart disease and other diseases of the circulatory system: Secondary | ICD-10-CM | POA: Insufficient documentation

## 2019-08-21 DIAGNOSIS — I10 Essential (primary) hypertension: Secondary | ICD-10-CM | POA: Insufficient documentation

## 2019-08-21 DIAGNOSIS — E119 Type 2 diabetes mellitus without complications: Secondary | ICD-10-CM | POA: Diagnosis not present

## 2019-08-21 DIAGNOSIS — I480 Paroxysmal atrial fibrillation: Secondary | ICD-10-CM | POA: Diagnosis not present

## 2019-08-21 DIAGNOSIS — D6869 Other thrombophilia: Secondary | ICD-10-CM | POA: Diagnosis not present

## 2019-08-21 DIAGNOSIS — Z7984 Long term (current) use of oral hypoglycemic drugs: Secondary | ICD-10-CM | POA: Diagnosis not present

## 2019-08-21 DIAGNOSIS — Z7901 Long term (current) use of anticoagulants: Secondary | ICD-10-CM | POA: Insufficient documentation

## 2019-08-21 DIAGNOSIS — Z885 Allergy status to narcotic agent status: Secondary | ICD-10-CM | POA: Insufficient documentation

## 2019-08-21 DIAGNOSIS — I4891 Unspecified atrial fibrillation: Secondary | ICD-10-CM | POA: Insufficient documentation

## 2019-08-21 DIAGNOSIS — Z833 Family history of diabetes mellitus: Secondary | ICD-10-CM | POA: Diagnosis not present

## 2019-08-21 DIAGNOSIS — E785 Hyperlipidemia, unspecified: Secondary | ICD-10-CM | POA: Diagnosis not present

## 2019-08-21 NOTE — Progress Notes (Signed)
Primary Care Physician: [name], MD Referring Physician: Dr. Sherlene Shams is a 58 y.o. male with a h/o afib s/p ablation one month ago. He is in SR and has not noted any afib. He did have a cough right after the ablation, saw PCP, thought to be bronchitis and was given Symbicort. The cough has improved but still is lingering mostly in the afternoon.. Does not have any shortness of breath or swallowing difficulties. No groin pain. He feels well otherwise. No fever. Pulse ox in normal range. CHA2DS2VASc score of 2. Continues eliquis 5 mg bid   Today, he denies symptoms of palpitations, chest pain, shortness of breath, orthopnea, PND, lower extremity edema, dizziness, presyncope, syncope, or neurologic sequela. The patient is tolerating medications without difficulties and is otherwise without complaint today.   Past Medical History:  Diagnosis Date  . Atrial fibrillation ([place])    s/p afib ablation 7/12  . Bradycardia    asymptomatic  . Colonic diverticular abscess   . Diabetes ([place])   . Diverticulitis   . Dysrhythmia   . Elevated LFTs   . Fall   . Family history of anesthesia complication    father gets pneumonia after "  . GERD (gastroesophageal reflux disease)   . Hyperlipidemia   . Hyperplastic colon polyp   . Hypertension   . Internal hemorrhoids   . Tinnitus   . Tubular adenoma of colon   . Wears glasses    Past Surgical History:  Procedure Laterality Date  . ATRIAL ABLATION SURGERY  03/24/11   PVI by Greggory Brandy  . ATRIAL FIBRILLATION ABLATION N/A 07/24/2019   Procedure: ATRIAL FIBRILLATION ABLATION;  Surgeon: [name], MD;  Location: [facility];  Service: Cardiovascular;  Laterality: N/A;  . BACK SURGERY    . BRONCHOSCOPY  01/23/2013    biopsy  . CHOLECYSTECTOMY  05/02/2014   LAP CHOLE  . CHOLECYSTECTOMY N/A 05/02/2014   Procedure: LAPAROSCOPIC CHOLECYSTECTOMY WITH INTRAOPERATIVE CHOLANGIOGRAM;  Surgeon: Erroll Luna, MD;  Location: [facility];   Service: General;  Laterality: N/A;  . EP IMPLANTABLE DEVICE N/A 06/27/2015   Procedure: Loop Recorder Insertion;  Surgeon: [name], MD;  Location: [facility];  Service: Cardiovascular;  Laterality: N/A;  . ERCP N/A 05/25/2014   Procedure: ENDOSCOPIC RETROGRADE CHOLANGIOPANCREATOGRAPHY (ERCP);  Surgeon: [name], MD;  Location: [facility];  Service: Endoscopy;  Laterality: N/A;  . Fistula surgery    . LOOP RECORDER REMOVAL N/A 07/24/2019   Procedure: LOOP RECORDER REMOVAL;  Surgeon: [name], MD;  Location: [facility];  Service: Cardiovascular;  Laterality: N/A;  . MEDIASTINOSCOPY N/A 01/23/2013   Procedure: MEDIASTINOSCOPY;  Surgeon: Ivin Poot, MD;  Location: [facility];  Service: Thoracic;  Laterality: N/A;  . QUADRICEPS TENDON REPAIR Right 01/13/2016   Procedure: REPAIR QUADRICEP TENDON;  Surgeon: Latanya Maudlin, MD;  Location: [facility];  Service: Orthopedics;  Laterality: Right;  Received femoral nerve block in holding  . SHOULDER SURGERY     x 2  . TONSILLECTOMY    . VIDEO BRONCHOSCOPY WITH ENDOBRONCHIAL ULTRASOUND N/A 01/23/2013   Procedure: VIDEO BRONCHOSCOPY WITH ENDOBRONCHIAL ULTRASOUND;  Surgeon: Ivin Poot, MD;  Location: [facility];  Service: Thoracic;  Laterality: N/A;    Current Outpatient Medications  Medication Sig Dispense Refill  . acetaminophen (TYLENOL) 500 MG tablet Take 2 tablets (1,000 mg total) by mouth every 6 (six) hours as needed. 30 tablet 0  . Biotin 5000 MCG TABS  Take 800 mcg by mouth daily.     . budesonide-formoterol (SYMBICORT) 160-4.5 MCG/ACT inhaler Inhale into the lungs.    . Coenzyme Q10 (COQ10 PO) Take 120 mg by mouth every evening.    [name] ELIQUIS 5 MG TABS tablet TAKE 1 TABLET BY MOUTH TWICE A DAY (Patient taking differently: Take 5 mg by mouth 2 (two) times daily. ) 60 tablet 6  . fluticasone (FLONASE) 50 MCG/ACT nasal spray Place 1 spray into the nose daily as needed for allergies.     . folic acid (FOLVITE) A999333 MCG  tablet Take 400 mcg by mouth daily.     . furosemide (LASIX) 40 MG tablet Take 1 tablet (40 mg total) by mouth daily. Take for 3 days 5 tablet 0  . hydrochlorothiazide (HYDRODIURIL) 12.5 MG tablet Take 12.5 mg by mouth daily.  3  . influenza vac split quadrivalent PF (FLUZONE QUADRIVALENT) 0.5 ML injection Fluzone Quad 2020-2021 (PF) 60 mcg (15 mcg x 4)/0.5 mL IM syringe  PHARMACY ADMINISTERED    . losartan (COZAAR) 100 MG tablet Take 100 mg by mouth every evening.   3  . metFORMIN (GLUCOPHAGE-XR) 500 MG 24 hr tablet Take 500 mg by mouth 2 (two) times daily.     . Multiple Vitamin (MULTIVITAMIN WITH MINERALS) TABS tablet Take 1 tablet by mouth daily. Centrum Silver    . Omega-3 Fatty Acids (FISH OIL PO) Take 1 capsule by mouth 2 (two) times daily.    [name] omeprazole (PRILOSEC) 40 MG capsule TAKE 1 CAPSULE (40 MG TOTAL) BY MOUTH DAILY. NEEDS OFFICE VISIT FOR FURTHER REFILLS 90 capsule 0  . simvastatin (ZOCOR) 20 MG tablet TAKE 1 TABLET BY MOUTH EVERYDAY AT BEDTIME (Patient taking differently: Take 20 mg by mouth at bedtime. ) 90 tablet 2   No current facility-administered medications for this encounter.    Allergies  Allergen Reactions  . Codeine Itching and Rash  . Latex Itching and Rash  . Oxycodone-Acetaminophen Itching and Rash    Social History   Socioeconomic History  . Marital status: Married    Spouse name: Rodena Piety  . Number of children: 3  . Years of education: 16  . Highest education level: Bachelor's degree (e.g., BA, AB, BS)  Occupational History  . Occupation: Minister/ Assoc. Pastor    Employer: [employer]  Tobacco Use  . Smoking status: Never Smoker  . Smokeless tobacco: Never Used  Substance and Sexual Activity  . Alcohol use: No  . Drug use: No  . Sexual activity: Yes  Other Topics Concern  . Not on file  Social History Narrative   Lives in [place].  3 children are all healthy.   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   No siblings  with CAD.   Social Determinants of Health   Financial Resource Strain:   . Difficulty of Paying Living Expenses: Not on file  Food Insecurity:   . Worried About Charity fundraiser in the Last Year: Not on file  . Ran Out of Food in the Last Year: Not on file  Transportation Needs:   . Lack of Transportation (Medical): Not on file  . Lack of Transportation (Non-Medical): Not on file  Physical Activity:   . Days of Exercise per Week: Not on file  . Minutes of Exercise per Session: Not on file  Stress:   . Feeling of Stress : Not on file  Social Connections:   . Frequency of Communication with Friends and Family:  Not on file  . Frequency of Social Gatherings with Friends and Family: Not on file  . Attends Religious Services: Not on file  . Active Member of Clubs or Organizations: Not on file  . Attends Archivist Meetings: Not on file  . Marital Status: Not on file  Intimate Partner Violence:   . Fear of Current or Ex-Partner: Not on file  . Emotionally Abused: Not on file  . Physically Abused: Not on file  . Sexually Abused: Not on file    Family History  Problem Relation Age of Onset  . Liver disease Mother        fatty liver  . Diabetes Father   . Heart disease Father        CABG at 51    ROS- All systems are reviewed and negative except as per the HPI above  Physical Exam: Vitals:   08/21/19 1400  BP: 140/84  Pulse: 63  Weight: 107.6 kg  Height: 5\' 7"  (1.702 m)   Wt Readings from Last 3 Encounters:  08/21/19 107.6 kg  07/24/19 104.3 kg  06/27/19 102.1 kg    Labs: Lab Results  Component Value Date   NA 140 07/20/2019   K 3.9 07/20/2019   CL 105 07/20/2019   CO2 24 07/20/2019   GLUCOSE 148 (H) 07/20/2019   BUN 21 (H) 07/20/2019   CREATININE 1.07 07/20/2019   CALCIUM 9.3 07/20/2019   PHOS 3.4 10/23/2011   MG 2.1 05/14/2018   Lab Results  Component Value Date   INR 1.0 03/31/2016   Lab Results  Component Value Date   CHOL 128  11/27/2012   HDL 34 (L) 11/27/2012   LDLCALC 69 11/27/2012   TRIG 123 11/27/2012     GEN- The patient is well appearing, alert and oriented x 3 today.   Head- normocephalic, atraumatic Eyes-  Sclera clear, conjunctiva pink Ears- hearing intact Oropharynx- clear Neck- supple, no JVP Lymph- no cervical lymphadenopathy Lungs- Clear to ausculation bilaterally, normal work of breathing Heart- Regular rate and rhythm, no murmurs, rubs or gallops, PMI not laterally displaced GI- soft, NT, ND, + BS Extremities- no clubbing, cyanosis, or edema MS- no significant deformity or atrophy Skin- no rash or lesion Psych- euthymic mood, full affect Neuro- strength and sensation are intact  EKG-NSR, normal ekg. V rate 63 bpm, PR int 200 ms, qrs int 92 ms, qtc 399 ms  Assessment and Plan: 1. afib One month post ablation Maintaining  SR  No swallowing or groin issues  2.  CHA2DS2VASc score of 2 Continue eliquis 5 mg bid, do not interrupt this during  the 3 month healing period   3. Cough Is improving but lingers in the pm Suggested to switch back to omeprazole form pantoprazole in case the latter is not treating his reflux as well resulting in cough F/u with PCP if it does not resolve  F/u with Dr. Rayann Heman 10/22/19   Geroge Baseman. Jurnie Garritano, [facility] [address] [place], [place] [address] [phone]

## 2019-08-27 ENCOUNTER — Telehealth: Payer: 59 | Admitting: Internal Medicine

## 2019-09-11 ENCOUNTER — Other Ambulatory Visit: Payer: Self-pay | Admitting: Internal Medicine

## 2019-09-12 NOTE — Progress Notes (Addendum)
[facility] Note  09/19/2019     CHIEF COMPLAINT Patient presents for Retina Evaluation   HISTORY OF PRESENT ILLNESS: Jorge Peterson is a 59 y.o. male who presents to the clinic today for:   HPI    Retina Evaluation    In right eye.  This started 3 months ago.  Duration of 3 months.  Associated Symptoms Floaters.  Negative for Flashes, Distortion, Pain, Photophobia, Trauma, Jaw Claudication, Fever, Fatigue, Blind Spot, Redness, Glare, Scalp Tenderness, Shoulder/Hip pain and Weight Loss.  Context:  distance vision, mid-range vision and near vision.  Treatments tried include eye drops.  I, the attending physician,  performed the HPI with the patient and updated documentation appropriately.          Comments    Patient referred by Dr. Gildardo Cranker NVG OD, on black top gtt. Bid OD (? Name). Patient diabetic for about 3 years--on metformin 500 mg bid. BS today was 150 this am. Last a1c was 8.1 in November 2020. Diagnosed about 2 months ago with NVG OD during routine eye exam. Patient hasn't seen well out of OD for years. No eye pain per patient.        Last edited by Bernarda Caffey, MD on 09/19/2019 10:07 AM. (History)    pt states his right eye has always been worse than his left, he states many years ago a dr told him that he could have had a sickness, like mumps or measles, that caused some kind of infection in his eye, he states most of the blood vessels in his eye have dried up, he states Dr. Ellin Mayhew noted very high IOP in his right eye, which was a new finding, at his last exam, he states over the past 20-30 years his field of vision has progressively gotten worse, pt denies any systemic diseases, he never had any major medical problems growing up, he states he never had an eye patch as a child, pt states Dr. Ellin Mayhew told him there are laser procedures that can be done to help with the pressure in his eye, pt states he has never had any pain or redness in his  eye, he says he is on a "black cap" drop BID OD for the last 2 weeks, pt states he is also diabetic, he takes 2 metformin a day which keeps his blood sugar under control, pt is on 5mg  prednisone for bronchitis since November, pt had a heart ablasion about 2 months ago and is currently on Eliquis  Referring physician: Dwain Sarna, [place],  [place] [id number]  HISTORICAL INFORMATION:   Selected notes from the MEDICAL RECORD NUMBER    CURRENT MEDICATIONS: Current Outpatient Medications (Ophthalmic Drugs)  Medication Sig  . brimonidine (ALPHAGAN) 0.2 % ophthalmic solution Place 1 drop into the right eye 2 (two) times daily.  . prednisoLONE acetate (PRED FORTE) 1 % ophthalmic suspension Place 1 drop into the right eye 4 (four) times daily for 7 days.   No current facility-administered medications for this visit. (Ophthalmic Drugs)   Current Outpatient Medications (Other)  Medication Sig  . acetaminophen (TYLENOL) 500 MG tablet Take 2 tablets (1,000 mg total) by mouth every 6 (six) hours as needed.  . Biotin 5000 MCG TABS Take 800 mcg by mouth daily.   . budesonide-formoterol (SYMBICORT) 160-4.5 MCG/ACT inhaler Inhale into the lungs.  . Coenzyme Q10 (COQ10 PO) Take 120 mg by mouth every evening.  [name] ELIQUIS 5 MG TABS  tablet TAKE 1 TABLET BY MOUTH TWICE A DAY (Patient taking differently: Take 5 mg by mouth 2 (two) times daily. )  . fluticasone (FLONASE) 50 MCG/ACT nasal spray Place 1 spray into the nose daily as needed for allergies.   . folic acid (FOLVITE) A999333 MCG tablet Take 400 mcg by mouth daily.   . furosemide (LASIX) 40 MG tablet Take 1 tablet (40 mg total) by mouth daily. Take for 3 days  . hydrochlorothiazide (HYDRODIURIL) 12.5 MG tablet Take 12.5 mg by mouth daily.  [name] losartan (COZAAR) 100 MG tablet Take 100 mg by mouth every evening.   . metFORMIN (GLUCOPHAGE-XR) 500 MG 24 hr tablet Take 500 mg by mouth 2 (two) times daily.   . Multiple Vitamin (MULTIVITAMIN WITH MINERALS)  TABS tablet Take 1 tablet by mouth daily. Centrum Silver  . Omega-3 Fatty Acids (FISH OIL PO) Take 1 capsule by mouth 2 (two) times daily.  . predniSONE (DELTASONE) 5 MG tablet Take 5 mg by mouth daily.  . simvastatin (ZOCOR) 20 MG tablet TAKE 1 TABLET BY MOUTH EVERYDAY AT BEDTIME (Patient taking differently: Take 20 mg by mouth at bedtime. )  . influenza vac split quadrivalent PF (FLUZONE QUADRIVALENT) 0.5 ML injection Fluzone Quad 2020-2021 (PF) 60 mcg (15 mcg x 4)/0.5 mL IM syringe  PHARMACY ADMINISTERED  . omeprazole (PRILOSEC) 40 MG capsule Take 1 capsule (40 mg total) by mouth daily. NEEDS OFFICE VISIT FOR FURTHER REFILLS   No current facility-administered medications for this visit. (Other)      REVIEW OF SYSTEMS: ROS    Positive for: Endocrine, Cardiovascular, Eyes, Respiratory   Negative for: Constitutional, Gastrointestinal, Neurological, Skin, Genitourinary, Musculoskeletal, HENT, Psychiatric, Allergic/Imm, Heme/Lymph   Last edited by Roselee Nova D, COT on 09/19/2019  9:42 AM. (History)       ALLERGIES Allergies  Allergen Reactions  . Codeine Itching and Rash  . Latex Itching and Rash  . Oxycodone-Acetaminophen Itching and Rash    PAST MEDICAL HISTORY Past Medical History:  Diagnosis Date  . Atrial fibrillation ([place])    s/p afib ablation 7/12  . Bradycardia    asymptomatic  . Colonic diverticular abscess   . Diabetes ([place])   . Diverticulitis   . Dysrhythmia   . Elevated LFTs   . Fall   . Family history of anesthesia complication    father gets pneumonia after "  . GERD (gastroesophageal reflux disease)   . Hyperlipidemia   . Hyperplastic colon polyp   . Hypertension   . Internal hemorrhoids   . Tinnitus   . Tubular adenoma of colon   . Wears glasses    Past Surgical History:  Procedure Laterality Date  . ATRIAL ABLATION SURGERY  03/24/11   PVI by Greggory Brandy  . ATRIAL FIBRILLATION ABLATION N/A 07/24/2019   Procedure: ATRIAL FIBRILLATION ABLATION;  Surgeon:  [name], MD;  Location: [facility];  Service: Cardiovascular;  Laterality: N/A;  . BACK SURGERY    . BRONCHOSCOPY  01/23/2013    biopsy  . CHOLECYSTECTOMY  05/02/2014   LAP CHOLE  . CHOLECYSTECTOMY N/A 05/02/2014   Procedure: LAPAROSCOPIC CHOLECYSTECTOMY WITH INTRAOPERATIVE CHOLANGIOGRAM;  Surgeon: Erroll Luna, MD;  Location: [facility];  Service: General;  Laterality: N/A;  . EP IMPLANTABLE DEVICE N/A 06/27/2015   Procedure: Loop Recorder Insertion;  Surgeon: [name], MD;  Location: [facility];  Service: Cardiovascular;  Laterality: N/A;  . ERCP N/A 05/25/2014   Procedure: ENDOSCOPIC RETROGRADE CHOLANGIOPANCREATOGRAPHY (ERCP);  Surgeon: Melene Plan  Ardis Hughs, MD;  Location: [facility];  Service: Endoscopy;  Laterality: N/A;  . Fistula surgery    . LOOP RECORDER REMOVAL N/A 07/24/2019   Procedure: LOOP RECORDER REMOVAL;  Surgeon: [name], MD;  Location: [facility];  Service: Cardiovascular;  Laterality: N/A;  . MEDIASTINOSCOPY N/A 01/23/2013   Procedure: MEDIASTINOSCOPY;  Surgeon: Ivin Poot, MD;  Location: [facility];  Service: Thoracic;  Laterality: N/A;  . QUADRICEPS TENDON REPAIR Right 01/13/2016   Procedure: REPAIR QUADRICEP TENDON;  Surgeon: Latanya Maudlin, MD;  Location: [facility];  Service: Orthopedics;  Laterality: Right;  Received femoral nerve block in holding  . SHOULDER SURGERY     x 2  . TONSILLECTOMY    . VIDEO BRONCHOSCOPY WITH ENDOBRONCHIAL ULTRASOUND N/A 01/23/2013   Procedure: VIDEO BRONCHOSCOPY WITH ENDOBRONCHIAL ULTRASOUND;  Surgeon: Ivin Poot, MD;  [facility];  Service: Thoracic;  Laterality: N/A;    FAMILY HISTORY Family History  Problem Relation Age of Onset  . Liver disease Mother        fatty liver  . Diabetes Father   . Heart disease Father        CABG at 61    SOCIAL HISTORY Social History   Tobacco Use  . Smoking status: Never Smoker  . Smokeless tobacco: Never Used  Substance Use Topics  . Alcohol use: No  .  Drug use: No         OPHTHALMIC EXAM:  Base Eye Exam    Visual Acuity (Snellen - Linear)      Right Left   Dist cc CF @ 3' 20/20   Dist ph cc NI    Correction: Glasses       Tonometry (Tonopen, 9:50 AM)      Right Left   Pressure 30 14       Gonioscopy ([place] four mirror)      Right Left   Temporal Grade 4 Grade 4   Nasal Grade 4 Grade 4   Superior Grade 4 Grade 4   Inferior Grade 4 Grade 4  OD: grade 4, +scattered NVA and PAS 360 OS: grade 4, no NVA/PAS       Pupils      Dark Light Shape React APD   Right 3 2.5 Round Brisk +3   Left 3 2 Round Brisk None       Visual Fields (Counting fingers)      Left Right     Full   Restrictions Partial outer superior temporal, inferior temporal, superior nasal, inferior nasal deficiencies        Extraocular Movement      Right Left    Full, Ortho Full, Ortho       Neuro/Psych    Oriented x3: Yes   Mood/Affect: Normal       Dilation    Both eyes: 1.0% Mydriacyl, 2.5% Phenylephrine @ 9:50 AM        Slit Lamp and Fundus Exam    Slit Lamp Exam      Right Left   Lids/Lashes Dermatochalasis - upper lid, Meibomian gland dysfunction, mild Telangiectasia Meibomian gland dysfunction, mild Telangiectasia   Conjunctiva/Sclera White and quiet White and quiet   Cornea Clear Clear   Anterior Chamber Deep and quiet, no cell or flare Deep and quiet   Iris Round and dilated, +NVI greatest nasally Round and dilated, No NVI   Lens 2+ Nuclear sclerosis, 2+ Cortical cataract 1-2+ Nuclear sclerosis, 1-2+ Cortical cataract   Vitreous  Mild Vitreous syneresis, mild cell/pigment Mild Vitreous syneresis       Fundus Exam      Right Left   Disc 2-3+ Pallor, mild fibrosis, +PPA Pink and Sharp   C/D Ratio 0.8 0.3   Macula Blunted foveal reflex, large central pigment clump, RPE mottling and clumping, +edema, scattered exudate Flat, Good foveal reflex, No heme or edema   Vessels Severe attenuation and sclerosis, peripheral sheathing,  Copper wiring Mild Vascular attenuation, mild Tortuousity   Periphery Attached, scattered pigment clumping, sclerotic vessels with sheathing, focal cluster of DBH at 0315 with surrounding circinate exudate, pigmented fibrotic scarring superiorly, +edema Attached           Refraction    Wearing Rx      Sphere Cylinder Axis   Right [place] Sphere    Left -1.00 +1.00 175       Manifest Refraction      Sphere Cylinder Axis Dist VA   Right       Left -1.00 +1.00 175 20/20          IMAGING AND PROCEDURES  Imaging and Procedures for @TODAY @  OCT, Retina - OU - Both Eyes       Right Eye Quality was good. Central Foveal Thickness: 245. Progression has no prior data. Findings include abnormal foveal contour, epiretinal membrane, preretinal fibrosis, outer retinal atrophy, subretinal hyper-reflective material, no SRF, intraretinal fluid (Scattered peripheral IRF caught on widefield).   Left Eye Quality was good. Central Foveal Thickness: 293. Progression has no prior data. Findings include normal foveal contour, no IRF, no SRF.   Notes *Images captured and stored on drive  Diagnosis / Impression:  OD: diffuse atrophy and peripheral IRF OS: NFP, no IRF/SRF  Clinical management:  See below  Abbreviations: NFP - Normal foveal profile. CME - cystoid macular edema. PED - pigment epithelial detachment. IRF - intraretinal fluid. SRF - subretinal fluid. EZ - ellipsoid zone. ERM - epiretinal membrane. ORA - outer retinal atrophy. ORT - outer retinal tubulation. SRHM - subretinal hyper-reflective material        Fluorescein Angiography Optos (Transit OD)       Right Eye   Early phase findings include delayed filling, vascular perfusion defect, staining (Severely delayed filling time, large areas of vascular non-perfusion greatest SN and temporal periphery). Mid/Late phase findings include vascular perfusion defect, staining, leakage (Leakage at non-perfusion boarders nasal and  temporal periphery).   Left Eye   Early phase findings include normal observations. Mid/Late phase findings include normal observations.   Notes **Images stored on drive**  Impression: OD: retinal vasculitis, severe periepheral non-perfusion and late leakage OS: normal study         Panretinal Photocoagulation - OD - Right Eye       LASER PROCEDURE NOTE  Diagnosis:   Proliferative Retinopathy and retinal vasculitis, RIGHT EYE  Procedure:  Pan-retinal photocoagulation using slit lamp laser, RIGHT EYE  Anesthesia:  Topical and subconjunctival lidocaine  Surgeon: Bernarda Caffey, MD, PhD   Informed consent obtained, operative eye marked, and time out performed prior to initiation of laser.   [place] [id number] slit lamp laser Pattern: 3x3 square Power: 320 mW Duration: 50 msec  Spot size: 200 microns  # spots: [id number]  Complications: None.   RTC: 3 wks   Patient tolerated the procedure well and received written and verbal post-procedure care information/education.                  ASSESSMENT/PLAN:  ICD-10-CM   1. Retinal vasculitis of right eye  H35.061 Panretinal Photocoagulation - OD - Right Eye  2. Retinal edema  H35.81 OCT, Retina - OU - Both Eyes  3. Proliferative retinopathy, non-diabetic, right  H35.21 Panretinal Photocoagulation - OD - Right Eye  4. Retinal neovascularization of right eye  H35.051 Panretinal Photocoagulation - OD - Right Eye  5. Iris neovascularization, right  H21.1X1   6. Neovascular glaucoma of right eye, severe stage  H40.51X3 Panretinal Photocoagulation - OD - Right Eye  7. Essential hypertension  I10   8. Hypertensive retinopathy of both eyes  H35.033 Fluorescein Angiography Optos (Transit OD)  9. Diabetes mellitus type 2 without retinopathy ([place])  E11.9   10. Combined forms of age-related cataract of both eyes  H25.813    1,2. Retinal Vasculitis OD  - Pt reports longstanding low vision and "inflammation" which  resulted in low vision since early childhood  - denies eye pain  - exam shows extensive vasculitis and vascular sheathing  - FA 1.24.20 shows severe peripheral vascular nonperfusion and leakage at border of perfusion/nonperfusion  - recommend PRP OD today, 01.20.21  - pt wishes to proceed  - RBA of procedure discussed, questions answered  - informed consent obtained and signed  - see procedure note  - start PF QID OD x7 days  - f/u 3 weeks  3-6. Proliferative retinopathy w/ retinal and iris neovascularization and Neovascular Glaucoma OD  - secondary to long standing retinal vasculitis  - IOP 30 today -- on ?Cosopt BID per Dr. Ellin Mayhew  - add brimonidine BID   - recommend PRP OD as above  - f/u in 3 wks  7,8. Hypertensive retinopathy OU - discussed importance of tight BP control - monitor  9. Diabetes mellitus, type 2 without retinopathy  - The incidence, risk factors for progression, natural history and treatment options for diabetic retinopathy  were discussed with patient.    - The need for close monitoring of blood glucose, blood pressure, and serum lipids, avoiding cigarette or any type of tobacco, and the need for long term follow up was also discussed with patient.  - monitor  10. Mixed form age related cataract OU  - The symptoms of cataract, surgical options, and treatments and risks were discussed with patient.  - discussed diagnosis and progression  - not yet visually significant  - monitor for now   Ophthalmic Meds Ordered this visit:  Meds ordered this encounter  Medications  . prednisoLONE acetate (PRED FORTE) 1 % ophthalmic suspension    Sig: Place 1 drop into the right eye 4 (four) times daily for 7 days.    Dispense:  10 mL    Refill:  0  . brimonidine (ALPHAGAN) 0.2 % ophthalmic solution    Sig: Place 1 drop into the right eye 2 (two) times daily.    Dispense:  15 mL    Refill:  10       Return in about 3 weeks (around 10/10/2019) for Dilated Exam,  OCT.  There are no Patient Instructions on file for this visit.   Explained the diagnoses, plan, and follow up with the patient and they expressed understanding.  Patient expressed understanding of the importance of proper follow up care.   This document serves as a record of services personally performed by Gardiner Sleeper, MD, PhD. It was created on their behalf by Ernest Mallick, OA, an ophthalmic assistant. The creation of this record is the provider's dictation and/or activities during  the visit.    Electronically signed by: Ernest Mallick, OA 01.20.2021 2:05 AM   Gardiner Sleeper, M.D., Ph.D. Diseases & Surgery of the Retina and Vitreous [facility]  I have reviewed the above documentation for accuracy and completeness, and I agree with the above. Gardiner Sleeper, M.D., Ph.D. 09/23/19 2:05 AM     Abbreviations: M myopia (nearsighted); A astigmatism; H hyperopia (farsighted); P presbyopia; Mrx spectacle prescription;  CTL contact lenses; OD right eye; OS left eye; OU both eyes  XT exotropia; ET esotropia; PEK punctate epithelial keratitis; PEE punctate epithelial erosions; DES dry eye syndrome; MGD meibomian gland dysfunction; ATs artificial tears; PFAT's preservative free artificial tears; [place] nuclear sclerotic cataract; PSC posterior subcapsular cataract; ERM epi-retinal membrane; PVD posterior vitreous detachment; RD retinal detachment; DM diabetes mellitus; DR diabetic retinopathy; NPDR non-proliferative diabetic retinopathy; PDR proliferative diabetic retinopathy; CSME clinically significant macular edema; DME diabetic macular edema; dbh dot blot hemorrhages; CWS cotton wool spot; POAG primary open angle glaucoma; C/D cup-to-disc ratio; HVF humphrey visual field; GVF goldmann visual field; OCT optical coherence tomography; IOP intraocular pressure; BRVO Branch retinal vein occlusion; CRVO central retinal vein occlusion; CRAO central retinal artery occlusion; BRAO  branch retinal artery occlusion; RT retinal tear; SB scleral buckle; PPV pars plana vitrectomy; VH Vitreous hemorrhage; PRP panretinal laser photocoagulation; IVK intravitreal kenalog; VMT vitreomacular traction; MH Macular hole;  NVD neovascularization of the disc; NVE neovascularization elsewhere; AREDS age related eye disease study; ARMD age related macular degeneration; POAG primary open angle glaucoma; EBMD epithelial/anterior basement membrane dystrophy; ACIOL anterior chamber intraocular lens; IOL intraocular lens; PCIOL posterior chamber intraocular lens; Phaco/IOL phacoemulsification with intraocular lens placement; [place] photorefractive keratectomy; LASIK laser assisted in situ keratomileusis; HTN hypertension; DM diabetes mellitus; COPD chronic obstructive pulmonary disease

## 2019-09-19 ENCOUNTER — Other Ambulatory Visit: Payer: Self-pay

## 2019-09-19 ENCOUNTER — Ambulatory Visit (INDEPENDENT_AMBULATORY_CARE_PROVIDER_SITE_OTHER): Payer: 59 | Admitting: Ophthalmology

## 2019-09-19 ENCOUNTER — Encounter (INDEPENDENT_AMBULATORY_CARE_PROVIDER_SITE_OTHER): Payer: Self-pay | Admitting: Ophthalmology

## 2019-09-19 DIAGNOSIS — H3521 Other non-diabetic proliferative retinopathy, right eye: Secondary | ICD-10-CM | POA: Diagnosis not present

## 2019-09-19 DIAGNOSIS — H35051 Retinal neovascularization, unspecified, right eye: Secondary | ICD-10-CM

## 2019-09-19 DIAGNOSIS — H25813 Combined forms of age-related cataract, bilateral: Secondary | ICD-10-CM

## 2019-09-19 DIAGNOSIS — H35033 Hypertensive retinopathy, bilateral: Secondary | ICD-10-CM | POA: Diagnosis not present

## 2019-09-19 DIAGNOSIS — H4051X3 Glaucoma secondary to other eye disorders, right eye, severe stage: Secondary | ICD-10-CM

## 2019-09-19 DIAGNOSIS — H35061 Retinal vasculitis, right eye: Secondary | ICD-10-CM

## 2019-09-19 DIAGNOSIS — H211X1 Other vascular disorders of iris and ciliary body, right eye: Secondary | ICD-10-CM

## 2019-09-19 DIAGNOSIS — H3581 Retinal edema: Secondary | ICD-10-CM | POA: Diagnosis not present

## 2019-09-19 DIAGNOSIS — E119 Type 2 diabetes mellitus without complications: Secondary | ICD-10-CM

## 2019-09-19 DIAGNOSIS — I10 Essential (primary) hypertension: Secondary | ICD-10-CM

## 2019-09-19 MED ORDER — BRIMONIDINE TARTRATE 0.2 % OP SOLN: 1.0000 [drp] | Freq: Two times a day (BID) | OPHTHALMIC | 10 refills | Status: DC

## 2019-09-19 MED ORDER — PREDNISOLONE ACETATE 1 % OP SUSP: 1.0000 [drp] | Freq: Four times a day (QID) | OPHTHALMIC | 0 refills | Status: AC

## 2019-09-21 ENCOUNTER — Telehealth: Payer: Self-pay | Admitting: Internal Medicine

## 2019-09-21 MED ORDER — OMEPRAZOLE 40 MG PO CPDR: 40.0000 mg | DELAYED_RELEASE_CAPSULE | Freq: Every day | ORAL | 0 refills | Status: DC

## 2019-09-21 NOTE — Telephone Encounter (Signed)
Rx sent 

## 2019-10-02 ENCOUNTER — Other Ambulatory Visit: Payer: Self-pay | Admitting: Internal Medicine

## 2019-10-02 NOTE — Telephone Encounter (Signed)
Eliquis 5mg  refill request received, pt is 59yrs old, weight-107.6kg, Crea- 1.07 on 07/20/2019, Diagnosis-Afib, and last seen by Roderic Palau on 08/21/2019. Dose is appropriate based on dosing criteria. Will send in refill to requested pharmacy.

## 2019-10-04 NOTE — Progress Notes (Signed)
[facility] Note  10/10/2019     CHIEF COMPLAINT Patient presents for Retina Follow Up   HISTORY OF PRESENT ILLNESS: Jorge Peterson is a 59 y.o. male who presents to the clinic today for:   HPI    Retina Follow Up    Patient presents with  Other.  In right eye.  This started 3 weeks ago.  Severity is severe.  Duration of 3 weeks.  Since onset it is stable.  I, the attending physician,  performed the HPI with the patient and updated documentation appropriately.          Comments    59 y/o male pt here for 3 wk f/u for vasculitis OD.  S/p PRP OD.  No change in [place] OU.  Denies pain, flashes, floaters.  Cosopt and Brimonidine BID OD.  BS 146 1 wk ago.  A1C 8.? 2 mos ago.       Last edited by Bernarda Caffey, MD on 10/10/2019 11:26 AM. (History)    pt states he had no problems after the laser procedure at last visit, he states he used the drops for a week, pt states he just found out from his mom that he was born with Coats disease, his wife states he was blind in that eye until 5th grade when he started being able to see a little bit  Referring physician: [name], MD [address] [place],  [place] [address]  HISTORICAL INFORMATION:   Selected notes from the [place]: Current Outpatient Medications (Ophthalmic Drugs)  Medication Sig  . brimonidine (ALPHAGAN) 0.2 % ophthalmic solution Place 1 drop into the right eye 2 (two) times daily.   No current facility-administered medications for this visit. (Ophthalmic Drugs)   Current Outpatient Medications (Other)  Medication Sig  . acetaminophen (TYLENOL) 500 MG tablet Take 2 tablets (1,000 mg total) by mouth every 6 (six) hours as needed.  . Biotin 5000 MCG TABS Take 800 mcg by mouth daily.   . budesonide-formoterol (SYMBICORT) 160-4.5 MCG/ACT inhaler Inhale into the lungs.  . Coenzyme Q10 (COQ10 PO) Take 120 mg by mouth every evening.  [name]  ELIQUIS 5 MG TABS tablet TAKE 1 TABLET BY MOUTH TWICE A DAY  . fluticasone (FLONASE) 50 MCG/ACT nasal spray Place 1 spray into the nose daily as needed for allergies.   . folic acid (FOLVITE) A999333 MCG tablet Take 400 mcg by mouth daily.   . furosemide (LASIX) 40 MG tablet Take 1 tablet (40 mg total) by mouth daily. Take for 3 days  . hydrochlorothiazide (HYDRODIURIL) 12.5 MG tablet Take 12.5 mg by mouth daily.  . influenza vac split quadrivalent PF (FLUZONE QUADRIVALENT) 0.5 ML injection Fluzone Quad 2020-2021 (PF) 60 mcg (15 mcg x 4)/0.5 mL IM syringe  PHARMACY ADMINISTERED  . losartan (COZAAR) 100 MG tablet Take 100 mg by mouth every evening.   . metFORMIN (GLUCOPHAGE-XR) 500 MG 24 hr tablet Take 500 mg by mouth 2 (two) times daily.   . Multiple Vitamin (MULTIVITAMIN WITH MINERALS) TABS tablet Take 1 tablet by mouth daily. Centrum Silver  . Omega-3 Fatty Acids (FISH OIL PO) Take 1 capsule by mouth 2 (two) times daily.  [name] omeprazole (PRILOSEC) 40 MG capsule Take 1 capsule (40 mg total) by mouth daily. NEEDS OFFICE VISIT FOR FURTHER REFILLS  . predniSONE (DELTASONE) 5 MG tablet Take 5 mg by mouth daily.  . simvastatin (ZOCOR) 20 MG  tablet Take 1 tablet (20 mg total) by mouth at bedtime.   No current facility-administered medications for this visit. (Other)      REVIEW OF SYSTEMS: ROS    Positive for: Gastrointestinal, Endocrine, Cardiovascular, Eyes   Negative for: Constitutional, Neurological, Skin, Genitourinary, Musculoskeletal, HENT, Respiratory, Psychiatric, Allergic/Imm, Heme/Lymph   Last edited by Matthew Folks, COA on 10/10/2019 10:06 AM. (History)       ALLERGIES Allergies  Allergen Reactions  . Codeine Itching and Rash  . Latex Itching and Rash  . Oxycodone-Acetaminophen Itching and Rash    PAST MEDICAL HISTORY Past Medical History:  Diagnosis Date  . Atrial fibrillation ([place])    s/p afib ablation 7/12  . Bradycardia    asymptomatic  . Cataract    Mixed OU  .  Colonic diverticular abscess   . Diabetes ([place])   . Diverticulitis   . Dysrhythmia   . Elevated LFTs   . Fall   . Family history of anesthesia complication    father gets pneumonia after "  . GERD (gastroesophageal reflux disease)   . Hyperlipidemia   . Hyperplastic colon polyp   . Hypertension   . Hypertensive retinopathy    OU  . Internal hemorrhoids   . Tinnitus   . Tubular adenoma of colon   . Wears glasses    Past Surgical History:  Procedure Laterality Date  . ATRIAL ABLATION SURGERY  03/24/11   PVI by Greggory Brandy  . ATRIAL FIBRILLATION ABLATION N/A 07/24/2019   Procedure: ATRIAL FIBRILLATION ABLATION;  Surgeon: [name], MD;  Location: [facility];  Service: Cardiovascular;  Laterality: N/A;  . BACK SURGERY    . BRONCHOSCOPY  01/23/2013    biopsy  . CHOLECYSTECTOMY  05/02/2014   LAP CHOLE  . CHOLECYSTECTOMY N/A 05/02/2014   Procedure: LAPAROSCOPIC CHOLECYSTECTOMY WITH INTRAOPERATIVE CHOLANGIOGRAM;  Surgeon: Erroll Luna, MD;  Location: [facility];  Service: General;  Laterality: N/A;  . EP IMPLANTABLE DEVICE N/A 06/27/2015   Procedure: Loop Recorder Insertion;  Surgeon: [name], MD;  Location: [facility];  Service: Cardiovascular;  Laterality: N/A;  . ERCP N/A 05/25/2014   Procedure: ENDOSCOPIC RETROGRADE CHOLANGIOPANCREATOGRAPHY (ERCP);  Surgeon: [name], MD;  Location: [facility];  Service: Endoscopy;  Laterality: N/A;  . Fistula surgery    . LOOP RECORDER REMOVAL N/A 07/24/2019   Procedure: LOOP RECORDER REMOVAL;  Surgeon: [name], MD;  Location: [facility];  Service: Cardiovascular;  Laterality: N/A;  . MEDIASTINOSCOPY N/A 01/23/2013   Procedure: MEDIASTINOSCOPY;  Surgeon: Ivin Poot, MD;  Location: [facility];  Service: Thoracic;  Laterality: N/A;  . QUADRICEPS TENDON REPAIR Right 01/13/2016   Procedure: REPAIR QUADRICEP TENDON;  Surgeon: Latanya Maudlin, MD;  Location: [facility];  Service: Orthopedics;  Laterality: Right;  Received  femoral nerve block in holding  . SHOULDER SURGERY     x 2  . TONSILLECTOMY    . VIDEO BRONCHOSCOPY WITH ENDOBRONCHIAL ULTRASOUND N/A 01/23/2013   Procedure: VIDEO BRONCHOSCOPY WITH ENDOBRONCHIAL ULTRASOUND;  Surgeon: Ivin Poot, MD;  Location: [facility];  Service: Thoracic;  Laterality: N/A;    FAMILY HISTORY Family History  Problem Relation Age of Onset  . Liver disease Mother        fatty liver  . Diabetes Father   . Heart disease Father        CABG at 85    SOCIAL HISTORY Social History   Tobacco Use  . Smoking status: Never Smoker  .  Smokeless tobacco: Never Used  Substance Use Topics  . Alcohol use: No  . Drug use: No         OPHTHALMIC EXAM:  Base Eye Exam    Visual Acuity (Snellen - Linear)      Right Left   Dist cc CF @ 2' 20/15 -2   Dist ph cc NI    Correction: Glasses       Tonometry (Tonopen, 10:09 AM)      Right Left   Pressure 10 11       Pupils      Dark Light Shape React APD   Right 3 4 Round Brisk +3   Left 3 2 Round Brisk None       Visual Fields (Counting fingers)      Left Right    Full    Restrictions  Partial outer superior temporal, inferior temporal, superior nasal, inferior nasal deficiencies       Extraocular Movement      Right Left    Full, Ortho Full, Ortho       Neuro/Psych    Oriented x3: Yes   Mood/Affect: Normal       Dilation    Both eyes: 1.0% Mydriacyl, 2.5% Phenylephrine @ 10:09 AM        Slit Lamp and Fundus Exam    Slit Lamp Exam      Right Left   Lids/Lashes Dermatochalasis - upper lid, Meibomian gland dysfunction, mild Telangiectasia Meibomian gland dysfunction, mild Telangiectasia   Conjunctiva/Sclera Subconjunctival hemorrhage ST quad, temporal Pinguecula White and quiet   Cornea Clear Clear   Anterior Chamber Deep and quiet, no cell or flare Deep and quiet   Iris Round and dilated, +NVI greatest nasally -- regressing Round and dilated, No NVI   Lens 2+ Nuclear sclerosis, 2+ Cortical cataract  1-2+ Nuclear sclerosis, 1-2+ Cortical cataract   Vitreous Mild Vitreous syneresis, mild cell/pigment Mild Vitreous syneresis       Fundus Exam      Right Left   Disc 3+ Pallor, mild fibrosis, +PPA Pink and Sharp   C/D Ratio 0.8 0.3   Macula Blunted foveal reflex, large central pigment clump, RPE mottling and clumping, +edema, scattered exudate. +IRH inferiorly Flat, Good foveal reflex, No heme or edema   Vessels Severe attenuation and sclerosis, peripheral sheathing, Copper wiring Mild Vascular attenuation, mild Tortuousity   Periphery Attached, scattered pigment clumping, sclerotic vessels with sheathing, focal cluster of DBH at 0315 with surrounding circinate exudate, pigmented fibrotic scarring superiorly, +edema, good 360 PRP changes except for a few focal areas of edema Attached             IMAGING AND PROCEDURES  Imaging and Procedures for @TODAY @  OCT, Retina - OU - Both Eyes       Right Eye Quality was good. Central Foveal Thickness: 243. Progression has been stable. Findings include abnormal foveal contour, epiretinal membrane, preretinal fibrosis, outer retinal atrophy, subretinal hyper-reflective material, no SRF, intraretinal fluid (Scattered peripheral IRF caught on widefield - stable; mild interval improvement in peripheral IRF).   Left Eye Quality was good. Central Foveal Thickness: 291. Progression has been stable. Findings include normal foveal contour, no IRF, no SRF.   Notes *Images captured and stored on drive  Diagnosis / Impression:  OD: diffuse atrophy and peripheral IRF -- Scattered peripheral IRF caught on widefield - stable; mild interval improvement in peripheral IRF OS: NFP, no IRF/SRF  Clinical management:  See below  Abbreviations: NFP -  Normal foveal profile. CME - cystoid macular edema. PED - pigment epithelial detachment. IRF - intraretinal fluid. SRF - subretinal fluid. EZ - ellipsoid zone. ERM - epiretinal membrane. ORA - outer retinal  atrophy. ORT - outer retinal tubulation. SRHM - subretinal hyper-reflective material                 ASSESSMENT/PLAN:    ICD-10-CM   1. Coats' disease of right eye  H35.021   2. Retinal edema  H35.81 OCT, Retina - OU - Both Eyes  3. Proliferative retinopathy, non-diabetic, right  H35.21   4. Retinal neovascularization of right eye  H35.051   5. Iris neovascularization, right  H21.1X1   6. Neovascular glaucoma of right eye, severe stage  H40.51X3   7. Essential hypertension  I10   8. Hypertensive retinopathy of both eyes  H35.033   9. Diabetes mellitus type 2 without retinopathy ([place])  E11.9   10. Combined forms of age-related cataract of both eyes  H25.813    1,2. Coats Disease OD  - Pt reports longstanding low vision and "inflammation" which resulted in low vision since early childhood  - pt states that he spoke his mother since last visit who reported early diagnosis of Coats Disease in pt  - denies eye pain  - exam shows extensive vascular sheathing and exudative disease  - FA 1.24.20 shows severe peripheral vascular nonperfusion and leakage at border of perfusion/nonperfusion  - s/p PRP OD (01.20.21)  - completed PF QID OD x7 days  - f/u 6 weeks  3-6. Proliferative retinopathy w/ retinal and iris neovascularization and Neovascular Glaucoma OD  - secondary to long standing retinal vasculitis  - IOP improved to 10 today -- on ?Cosopt BID per Dr. Ellin Mayhew and brimonidine BID started here  - okay to stop brim  - s/p PRP OD as above  - f/u in 6 weeks  7,8. Hypertensive retinopathy OU - discussed importance of tight BP control - monitor  9. Diabetes mellitus, type 2 without retinopathy  - The incidence, risk factors for progression, natural history and treatment options for diabetic retinopathy  were discussed with patient.    - The need for close monitoring of blood glucose, blood pressure, and serum lipids, avoiding cigarette or any type of tobacco, and the need for  long term follow up was also discussed with patient.  - monitor  10. Mixed form age related cataract OU  - The symptoms of cataract, surgical options, and treatments and risks were discussed with patient.  - discussed diagnosis and progression  - not yet visually significant  - monitor for now   Ophthalmic Meds Ordered this visit:  No orders of the defined types were placed in this encounter.      Return in about 6 weeks (around 11/21/2019) for f/u retinal vasculitis OD, DFE, OCT.  There are no Patient Instructions on file for this visit.   Explained the diagnoses, plan, and follow up with the patient and they expressed understanding.  Patient expressed understanding of the importance of proper follow up care.   This document serves as a record of services personally performed by Gardiner Sleeper, MD, PhD. It was created on their behalf by Roselee Nova, COMT. The creation of this record is the provider's dictation and/or activities during the visit.  Electronically signed by: Roselee Nova, COMT 10/14/19 2:14 AM  Gardiner Sleeper, M.D., Ph.D. Diseases & Surgery of the Retina and [place] 10/10/2019  I have reviewed the above documentation for accuracy and completeness, and I agree with the above. Gardiner Sleeper, M.D., Ph.D. 10/14/19 2:14 AM   Abbreviations: M myopia (nearsighted); A astigmatism; H hyperopia (farsighted); P presbyopia; Mrx spectacle prescription;  CTL contact lenses; OD right eye; OS left eye; OU both eyes  XT exotropia; ET esotropia; PEK punctate epithelial keratitis; PEE punctate epithelial erosions; DES dry eye syndrome; MGD meibomian gland dysfunction; ATs artificial tears; PFAT's preservative free artificial tears; [place] nuclear sclerotic cataract; PSC posterior subcapsular cataract; ERM epi-retinal membrane; PVD posterior vitreous detachment; RD retinal detachment; DM diabetes mellitus; DR diabetic retinopathy; NPDR  non-proliferative diabetic retinopathy; PDR proliferative diabetic retinopathy; CSME clinically significant macular edema; DME diabetic macular edema; dbh dot blot hemorrhages; CWS cotton wool spot; POAG primary open angle glaucoma; C/D cup-to-disc ratio; HVF humphrey visual field; GVF goldmann visual field; OCT optical coherence tomography; IOP intraocular pressure; BRVO Branch retinal vein occlusion; CRVO central retinal vein occlusion; CRAO central retinal artery occlusion; BRAO branch retinal artery occlusion; RT retinal tear; SB scleral buckle; PPV pars plana vitrectomy; VH Vitreous hemorrhage; PRP panretinal laser photocoagulation; IVK intravitreal kenalog; VMT vitreomacular traction; MH Macular hole;  NVD neovascularization of the disc; NVE neovascularization elsewhere; AREDS age related eye disease study; ARMD age related macular degeneration; POAG primary open angle glaucoma; EBMD epithelial/anterior basement membrane dystrophy; ACIOL anterior chamber intraocular lens; IOL intraocular lens; PCIOL posterior chamber intraocular lens; Phaco/IOL phacoemulsification with intraocular lens placement; [place] photorefractive keratectomy; LASIK laser assisted in situ keratomileusis; HTN hypertension; DM diabetes mellitus; COPD chronic obstructive pulmonary disease

## 2019-10-10 ENCOUNTER — Ambulatory Visit (INDEPENDENT_AMBULATORY_CARE_PROVIDER_SITE_OTHER): Payer: 59 | Admitting: Ophthalmology

## 2019-10-10 ENCOUNTER — Encounter (INDEPENDENT_AMBULATORY_CARE_PROVIDER_SITE_OTHER): Payer: Self-pay | Admitting: Ophthalmology

## 2019-10-10 DIAGNOSIS — H35021 Exudative retinopathy, right eye: Secondary | ICD-10-CM

## 2019-10-10 DIAGNOSIS — H3581 Retinal edema: Secondary | ICD-10-CM

## 2019-10-10 DIAGNOSIS — H211X1 Other vascular disorders of iris and ciliary body, right eye: Secondary | ICD-10-CM

## 2019-10-10 DIAGNOSIS — H25813 Combined forms of age-related cataract, bilateral: Secondary | ICD-10-CM

## 2019-10-10 DIAGNOSIS — I10 Essential (primary) hypertension: Secondary | ICD-10-CM

## 2019-10-10 DIAGNOSIS — H4051X3 Glaucoma secondary to other eye disorders, right eye, severe stage: Secondary | ICD-10-CM

## 2019-10-10 DIAGNOSIS — H3521 Other non-diabetic proliferative retinopathy, right eye: Secondary | ICD-10-CM

## 2019-10-10 DIAGNOSIS — H35051 Retinal neovascularization, unspecified, right eye: Secondary | ICD-10-CM | POA: Diagnosis not present

## 2019-10-10 DIAGNOSIS — H35033 Hypertensive retinopathy, bilateral: Secondary | ICD-10-CM

## 2019-10-10 DIAGNOSIS — E119 Type 2 diabetes mellitus without complications: Secondary | ICD-10-CM

## 2019-10-12 ENCOUNTER — Other Ambulatory Visit: Payer: Self-pay | Admitting: Internal Medicine

## 2019-10-22 ENCOUNTER — Encounter: Payer: Self-pay | Admitting: Internal Medicine

## 2019-10-22 ENCOUNTER — Other Ambulatory Visit: Payer: Self-pay

## 2019-10-22 ENCOUNTER — Telehealth (INDEPENDENT_AMBULATORY_CARE_PROVIDER_SITE_OTHER): Payer: 59 | Admitting: Internal Medicine

## 2019-10-22 VITALS — BP 124/81 | HR 57 | Ht 67.0 in | Wt 225.0 lb

## 2019-10-22 DIAGNOSIS — I480 Paroxysmal atrial fibrillation: Secondary | ICD-10-CM | POA: Diagnosis not present

## 2019-10-22 DIAGNOSIS — R001 Bradycardia, unspecified: Secondary | ICD-10-CM | POA: Diagnosis not present

## 2019-10-22 DIAGNOSIS — I10 Essential (primary) hypertension: Secondary | ICD-10-CM | POA: Diagnosis not present

## 2019-10-22 NOTE — Progress Notes (Signed)
Electrophysiology TeleHealth Note   Due to national recommendations of social distancing due to COVID 19, an audio/video telehealth visit is felt to be most appropriate for this patient at this time.  See MyChart message from today for the patient's consent to telehealth for [facility].   Date:  10/22/2019   ID:  Jorge Peterson, DOB 13-Apr-1961, MRN [id number]  Location: patient's home  Provider location:  [facility]  Evaluation Performed: Follow-up visit  PCP:  [name], MD   Electrophysiologist:  Dr Rayann Heman  Chief Complaint:  palpitations  History of Present Illness:    Jorge Peterson is a 59 y.o. male who presents via telehealth conferencing today.  Since his ablation, the patient reports doing very well.  Denies procedure related complications.  Pleased with results.  Today, he denies symptoms of palpitations, chest pain, shortness of breath,  lower extremity edema, dizziness, presyncope, or syncope.  The patient is otherwise without complaint today.  The patient denies symptoms of fevers, chills, cough, or new SOB worrisome for COVID 19.  Past Medical History:  Diagnosis Date  . Atrial fibrillation ([place])    s/p afib ablation 7/12 and 11/20  . Bradycardia    asymptomatic  . Cataract    Mixed OU  . Colonic diverticular abscess   . Diabetes ([place])   . Diverticulitis   . Elevated LFTs   . Fall   . Family history of anesthesia complication    father gets pneumonia after "  . GERD (gastroesophageal reflux disease)   . Hyperlipidemia   . Hyperplastic colon polyp   . Hypertension   . Hypertensive retinopathy    OU  . Internal hemorrhoids   . Tinnitus   . Tubular adenoma of colon   . Wears glasses     Past Surgical History:  Procedure Laterality Date  . ATRIAL ABLATION SURGERY  03/24/11   PVI by Greggory Brandy  . ATRIAL FIBRILLATION ABLATION N/A 07/24/2019   Procedure: ATRIAL FIBRILLATION ABLATION;  Surgeon: [name], MD;  Location: [facility];   Service: Cardiovascular;  Laterality: N/A;  . BACK SURGERY    . BRONCHOSCOPY  01/23/2013    biopsy  . CHOLECYSTECTOMY  05/02/2014   LAP CHOLE  . CHOLECYSTECTOMY N/A 05/02/2014   Procedure: LAPAROSCOPIC CHOLECYSTECTOMY WITH INTRAOPERATIVE CHOLANGIOGRAM;  Surgeon: Erroll Luna, MD;  Location: [facility];  Service: General;  Laterality: N/A;  . EP IMPLANTABLE DEVICE N/A 06/27/2015   Procedure: Loop Recorder Insertion;  Surgeon: [name], MD;  Location: [facility];  Service: Cardiovascular;  Laterality: N/A;  . ERCP N/A 05/25/2014   Procedure: ENDOSCOPIC RETROGRADE CHOLANGIOPANCREATOGRAPHY (ERCP);  Surgeon: [name], MD;  Location: [facility];  Service: Endoscopy;  Laterality: N/A;  . Fistula surgery    . LOOP RECORDER REMOVAL N/A 07/24/2019   Procedure: LOOP RECORDER REMOVAL;  Surgeon: [name], MD;  Location: [facility];  Service: Cardiovascular;  Laterality: N/A;  . MEDIASTINOSCOPY N/A 01/23/2013   Procedure: MEDIASTINOSCOPY;  Surgeon: Ivin Poot, MD;  Location: [facility];  Service: Thoracic;  Laterality: N/A;  . QUADRICEPS TENDON REPAIR Right 01/13/2016   Procedure: REPAIR QUADRICEP TENDON;  Surgeon: Latanya Maudlin, MD;  [facility];  Service: Orthopedics;  Laterality: Right;  Received femoral nerve block in holding  . SHOULDER SURGERY     x 2  . TONSILLECTOMY    . VIDEO BRONCHOSCOPY WITH ENDOBRONCHIAL ULTRASOUND N/A 01/23/2013   Procedure: VIDEO BRONCHOSCOPY WITH ENDOBRONCHIAL ULTRASOUND;  Surgeon: Ivin Poot, MD;  [facility]: [facility];  Service: Thoracic;  Laterality: N/A;    Current Outpatient Medications  Medication Sig Dispense Refill  . acetaminophen (TYLENOL) 500 MG tablet Take 2 tablets (1,000 mg total) by mouth every 6 (six) hours as needed. 30 tablet 0  . Biotin 5000 MCG TABS Take 800 mcg by mouth daily.     . brimonidine (ALPHAGAN) 0.2 % ophthalmic solution Place 1 drop into the right eye 2 (two) times daily. 15 mL 10  . budesonide-formoterol  (SYMBICORT) 160-4.5 MCG/ACT inhaler Inhale into the lungs.    . Coenzyme Q10 (COQ10 PO) Take 120 mg by mouth every evening.    [name] ELIQUIS 5 MG TABS tablet TAKE 1 TABLET BY MOUTH TWICE A DAY 60 tablet 10  . fluticasone (FLONASE) 50 MCG/ACT nasal spray Place 1 spray into the nose daily as needed for allergies.     . folic acid (FOLVITE) A999333 MCG tablet Take 400 mcg by mouth daily.     . furosemide (LASIX) 40 MG tablet Take 1 tablet (40 mg total) by mouth daily. Take for 3 days 5 tablet 0  . hydrochlorothiazide (HYDRODIURIL) 12.5 MG tablet Take 12.5 mg by mouth daily.  3  . influenza vac split quadrivalent PF (FLUZONE QUADRIVALENT) 0.5 ML injection Fluzone Quad 2020-2021 (PF) 60 mcg (15 mcg x 4)/0.5 mL IM syringe  PHARMACY ADMINISTERED    . losartan (COZAAR) 100 MG tablet Take 100 mg by mouth every evening.   3  . metFORMIN (GLUCOPHAGE-XR) 500 MG 24 hr tablet Take 500 mg by mouth 2 (two) times daily.     . Multiple Vitamin (MULTIVITAMIN WITH MINERALS) TABS tablet Take 1 tablet by mouth daily. Centrum Silver    . Omega-3 Fatty Acids (FISH OIL PO) Take 1 capsule by mouth 2 (two) times daily.    [name] omeprazole (PRILOSEC) 40 MG capsule Take 1 capsule (40 mg total) by mouth daily. NEEDS OFFICE VISIT FOR FURTHER REFILLS 90 capsule 0  . predniSONE (DELTASONE) 5 MG tablet Take 5 mg by mouth daily.    . simvastatin (ZOCOR) 20 MG tablet Take 1 tablet (20 mg total) by mouth at bedtime. 90 tablet 1   No current facility-administered medications for this visit.    Allergies:   Codeine, Latex, and Oxycodone-acetaminophen   Social History:  The patient  reports that he has never smoked. He has never used smokeless tobacco. He reports that he does not drink alcohol or use drugs.   Family History:  The patient's family history includes Diabetes in his father; Heart disease in his father; Liver disease in his mother.   ROS:  Please see the history of present illness.   All other systems are personally reviewed and  negative.    Exam:    Vital Signs:  BP 124/81   Pulse (!) 57   Ht 5\' 7"  (1.702 m)   Wt 225 lb (102.1 kg)   BMI 35.24 kg/m   Well sounding and appearing, alert and conversant, regular work of breathing,  good skin color Eyes- anicteric, neuro- grossly intact, skin- no apparent rash or lesions or cyanosis, mouth- oral mucosa is pink  Labs/Other Tests and Data Reviewed:    Recent Labs: 07/20/2019: BUN 21; Creatinine, Ser 1.07; Hemoglobin 14.2; Platelets 191; Potassium 3.9; Sodium 140   Wt Readings from Last 3 Encounters:  10/22/19 225 lb (102.1 kg)  08/21/19 237 lb 3.2 oz (107.6 kg)  07/24/19 230 lb (104.3 kg)  ASSESSMENT & PLAN:    1.  Paroxysmal atrial fibrillation Doing very well post ablation chads2vasc score is 2.  He is on eliquis ILR is at EOL.  2. HTN Stable No change required today  3. Bradycardia Asymptomatic Rates have improved post ablation  Follow-up:  3 months with me   Patient Risk:  after full review of this patients clinical status, I feel that they are at moderate risk at this time.  Today, I have spent 15 minutes with the patient with telehealth technology discussing arrhythmia management .    Army Fossa, MD  10/22/2019 10:15 AM     [facility] [address] [place] [address] [phone] (office) [phone] (fax)

## 2019-11-05 ENCOUNTER — Other Ambulatory Visit: Payer: Self-pay

## 2019-11-05 ENCOUNTER — Encounter: Payer: Self-pay | Admitting: Internal Medicine

## 2019-11-05 ENCOUNTER — Ambulatory Visit: Payer: 59 | Admitting: Internal Medicine

## 2019-11-05 VITALS — BP 114/84 | HR 59 | Temp 97.8°F | Ht 67.0 in | Wt 232.0 lb

## 2019-11-05 DIAGNOSIS — R194 Change in bowel habit: Secondary | ICD-10-CM | POA: Diagnosis not present

## 2019-11-05 DIAGNOSIS — K219 Gastro-esophageal reflux disease without esophagitis: Secondary | ICD-10-CM

## 2019-11-05 DIAGNOSIS — Z8719 Personal history of other diseases of the digestive system: Secondary | ICD-10-CM | POA: Diagnosis not present

## 2019-11-05 DIAGNOSIS — Z86010 Personal history of colonic polyps: Secondary | ICD-10-CM | POA: Diagnosis not present

## 2019-11-05 MED ORDER — OMEPRAZOLE 40 MG PO CPDR: 40.0000 mg | DELAYED_RELEASE_CAPSULE | Freq: Every day | ORAL | 3 refills | Status: DC

## 2019-11-05 NOTE — Patient Instructions (Signed)
We have sent the following medications to your pharmacy for you to pick up at your convenience: Omeprazole 40 mg daily   Please purchase the following medications over the counter and take as directed: Metamucil 1 tablespoon daily.  If you are age 61 or older, your body mass index should be between 23-30. Your Body mass index is 36.34 kg/m. If this is out of the aforementioned range listed, please consider follow up with your Primary Care Provider.  If you are age 33 or younger, your body mass index should be between 19-25. Your Body mass index is 36.34 kg/m. If this is out of the aformentioned range listed, please consider follow up with your Primary Care Provider.

## 2019-11-06 NOTE — Progress Notes (Signed)
   Subjective:    Patient ID: Jorge Peterson, male    DOB: 03/01/1961, 58 y.o.   MRN: [id number]  HPI Jorge Peterson is a 59 year old male with a history of GERD, complicated diverticulitis status post sigmoid resection in November 2019, history of adenomatous colon polyps who is here for follow-up.  He also has a history of atrial fibrillation treated with ablation, hypertension, hyperlipidemia and diabetes.  His last visit in the office was October 2019 and for surveillance colonoscopy prior to sigmoidectomy in November 2019.  He reports that he has been doing well.  His reflux is well controlled with omeprazole which he uses 40 mg a day.  No dysphagia or odynophagia.  Good appetite.  No nausea or vomiting.  Bowel habits have been mostly regular though he does note that every 2 or 3 days he will have more full or urgent bowel movements 2 or 3 times a day.  No blood in his stool or melena.  He has increased dose of Metformin and wonders if this may be affecting his bowel movements to some degree.  He is not having pure diarrhea.  No lower abdominal pain.   Review of Systems As per HPI, otherwise negative  Current Medications, Allergies, Past Medical History, Past Surgical History, Family History and Social History were reviewed in [employer] record.     Objective:   Physical Exam BP 114/84   Pulse (!) 59   Temp 97.8 F (36.6 C)   Ht 5\' 7"  (1.702 m)   Wt 232 lb (105.2 kg)   BMI 36.34 kg/m  Gen: awake, alert HEENT: anicteric, op clear CV: RRR, no mrg Pulm: CTA b/l Abd: soft, NT/ND, +BS throughout Ext: no c/c/e Neuro: nonfocal     Assessment & Plan:  59 year old male with a history of GERD, complicated diverticulitis status post sigmoid resection in November 2019, history of adenomatous colon polyps who is here for follow-up.  1.  GERD --well-controlled on omeprazole at 40 mg daily.  No alarm symptoms.  Continue current therapy  2.  History of  complicated diverticulitis status post sigmoid resection --uneventful surgery in November 2019.  He has recovered well without recurrent diverticulitis  3.  Minor change in bowel habit --likely due to medications but also prior sigmoid resection.  Encouraged him to begin Metamucil 1-2 heaping tablespoons daily.  This will likely help iron out and regulate his bowel movements.  4.  History of adenomatous colon polyps --5-year surveillance colonoscopy which would be November 2024  Annual follow-up, sooner if needed  20 minutes total spent today including patient facing time, coordination of care, reviewing medical history/procedures/pertinent radiology studies, and documentation of the encounter.

## 2019-11-15 ENCOUNTER — Other Ambulatory Visit (INDEPENDENT_AMBULATORY_CARE_PROVIDER_SITE_OTHER): Payer: Self-pay

## 2019-11-15 MED ORDER — COMBIGAN 0.2-0.5 % OP SOLN: 1.0000 [drp] | Freq: Two times a day (BID) | OPHTHALMIC | 10 refills | Status: AC

## 2019-11-15 NOTE — Progress Notes (Signed)
[facility] Note  11/21/2019     CHIEF COMPLAINT Patient presents for Retina Follow Up   HISTORY OF PRESENT ILLNESS: Jorge Peterson is a 59 y.o. male who presents to the clinic today for:   HPI    Retina Follow Up    Patient presents with  Other.  In right eye.  This started months ago.  Severity is severe.  Duration of 6 weeks.  Since onset it is stable.  I, the attending physician,  performed the HPI with the patient and updated documentation appropriately.          Comments    59 y/o male pt here for 6 wk f/u for retinal vasculitis OD.  No change in [place] OU.  Denies pain, flashes, floaters.  Combigan bid OD.  BS 170 this a.m.  A1C 8.1 2 wks ago.       Last edited by Bernarda Caffey, MD on 11/21/2019 12:21 PM. (History)    pt states he is doing well, no change in vision  Referring physician: Dwain Sarna, OD [place],  [place] [id number]  HISTORICAL INFORMATION:   Selected notes from the MEDICAL RECORD NUMBER Referred by Dr. Ellin Mayhew for retinal vasculitis OD   CURRENT MEDICATIONS: Current Outpatient Medications (Ophthalmic Drugs)  Medication Sig  . brimonidine-timolol (COMBIGAN) 0.2-0.5 % ophthalmic solution Place 1 drop into the right eye every 12 (twelve) hours.  . brimonidine-timolol (COMBIGAN) 0.2-0.5 % ophthalmic solution Combigan 0.2 %-0.5 % eye drops   No current facility-administered medications for this visit. (Ophthalmic Drugs)   Current Outpatient Medications (Other)  Medication Sig  . acetaminophen (TYLENOL) 500 MG tablet Take 2 tablets (1,000 mg total) by mouth every 6 (six) hours as needed.  . Biotin 5000 MCG TABS Take 800 mcg by mouth daily.   . budesonide-formoterol (SYMBICORT) 160-4.5 MCG/ACT inhaler Inhale into the lungs.  . Coenzyme Q10 (COQ10 PO) Take 120 mg by mouth every evening.  [name] ELIQUIS 5 MG TABS tablet TAKE 1 TABLET BY MOUTH TWICE A DAY  . fluticasone (FLONASE) 50 MCG/ACT nasal spray Place 1 spray into the  nose daily as needed for allergies.   . folic acid (FOLVITE) A999333 MCG tablet Take 400 mcg by mouth daily.   . furosemide (LASIX) 40 MG tablet Take 1 tablet (40 mg total) by mouth daily. Take for 3 days  . hydrochlorothiazide (HYDRODIURIL) 12.5 MG tablet Take 12.5 mg by mouth daily.  . influenza vac split quadrivalent PF (FLUZONE QUADRIVALENT) 0.5 ML injection Fluzone Quad 2020-2021 (PF) 60 mcg (15 mcg x 4)/0.5 mL IM syringe  PHARMACY ADMINISTERED  . losartan (COZAAR) 100 MG tablet Take 100 mg by mouth every evening.   . metFORMIN (GLUCOPHAGE-XR) 500 MG 24 hr tablet Take 500 mg by mouth 2 (two) times daily.   . Multiple Vitamin (MULTIVITAMIN WITH MINERALS) TABS tablet Take 1 tablet by mouth daily. Centrum Silver  . Omega-3 Fatty Acids (FISH OIL PO) Take 1 capsule by mouth 2 (two) times daily.  [name] omeprazole (PRILOSEC) 40 MG capsule Take 1 capsule (40 mg total) by mouth daily.  . predniSONE (DELTASONE) 5 MG tablet Take 5 mg by mouth daily.  . simvastatin (ZOCOR) 20 MG tablet Take 1 tablet (20 mg total) by mouth at bedtime.   No current facility-administered medications for this visit. (Other)      REVIEW OF SYSTEMS: ROS    Positive for: Gastrointestinal, Endocrine, Cardiovascular, Eyes   Negative for: Constitutional, Neurological,  Skin, Genitourinary, Musculoskeletal, HENT, Psychiatric, Allergic/Imm, Heme/Lymph   Last edited by Matthew Folks, COA on 11/21/2019  9:56 AM. (History)       ALLERGIES Allergies  Allergen Reactions  . Codeine Itching and Rash  . Latex Itching and Rash  . Oxycodone-Acetaminophen Itching and Rash    PAST MEDICAL HISTORY Past Medical History:  Diagnosis Date  . Atrial fibrillation ([place])    s/p afib ablation 7/12 and 11/20  . Bradycardia    asymptomatic  . Cataract    Mixed OU  . Colonic diverticular abscess   . Diabetes ([place])   . Diverticulitis   . Elevated LFTs   . Fall   . Family history of anesthesia complication    father gets pneumonia  after "  . GERD (gastroesophageal reflux disease)   . Hyperlipidemia   . Hyperplastic colon polyp   . Hypertension   . Hypertensive retinopathy    OU  . Internal hemorrhoids   . Tinnitus   . Tubular adenoma of colon   . Wears glasses    Past Surgical History:  Procedure Laterality Date  . ATRIAL ABLATION SURGERY  03/24/11   PVI by Greggory Brandy  . ATRIAL FIBRILLATION ABLATION N/A 07/24/2019   Procedure: ATRIAL FIBRILLATION ABLATION;  Surgeon: [name], MD;  Location: [facility];  Service: Cardiovascular;  Laterality: N/A;  . BACK SURGERY    . BRONCHOSCOPY  01/23/2013    biopsy  . CHOLECYSTECTOMY  05/02/2014   LAP CHOLE  . CHOLECYSTECTOMY N/A 05/02/2014   Procedure: LAPAROSCOPIC CHOLECYSTECTOMY WITH INTRAOPERATIVE CHOLANGIOGRAM;  Surgeon: Erroll Luna, MD;  Location: [facility];  Service: General;  Laterality: N/A;  . EP IMPLANTABLE DEVICE N/A 06/27/2015   Procedure: Loop Recorder Insertion;  Surgeon: [name], MD;  Location: [facility];  Service: Cardiovascular;  Laterality: N/A;  . ERCP N/A 05/25/2014   Procedure: ENDOSCOPIC RETROGRADE CHOLANGIOPANCREATOGRAPHY (ERCP);  Surgeon: [name], MD;  Location: [facility];  Service: Endoscopy;  Laterality: N/A;  . Fistula surgery    . LOOP RECORDER REMOVAL N/A 07/24/2019   Procedure: LOOP RECORDER REMOVAL;  Surgeon: [name], MD;  Location: [facility];  Service: Cardiovascular;  Laterality: N/A;  . MEDIASTINOSCOPY N/A 01/23/2013   Procedure: MEDIASTINOSCOPY;  Surgeon: Ivin Poot, MD;  Location: [facility];  Service: Thoracic;  Laterality: N/A;  . QUADRICEPS TENDON REPAIR Right 01/13/2016   Procedure: REPAIR QUADRICEP TENDON;  Surgeon: Latanya Maudlin, MD;  Location: [facility];  Service: Orthopedics;  Laterality: Right;  Received femoral nerve block in holding  . SHOULDER SURGERY     x 2  . TONSILLECTOMY    . VIDEO BRONCHOSCOPY WITH ENDOBRONCHIAL ULTRASOUND N/A 01/23/2013   Procedure: VIDEO BRONCHOSCOPY WITH  ENDOBRONCHIAL ULTRASOUND;  Surgeon: Ivin Poot, MD;  [facility];  Service: Thoracic;  Laterality: N/A;    FAMILY HISTORY Family History  Problem Relation Age of Onset  . Liver disease Mother        fatty liver  . Diabetes Father   . Heart disease Father        CABG at 65  . Colon cancer Neg Hx   . Stomach cancer Neg Hx   . Pancreatic cancer Neg Hx   . Esophageal cancer Neg Hx     SOCIAL HISTORY Social History   Tobacco Use  . Smoking status: Never Smoker  . Smokeless tobacco: Never Used  Substance Use Topics  . Alcohol use: No  . Drug use: No  OPHTHALMIC EXAM:  Base Eye Exam    Visual Acuity (Snellen - Linear)      Right Left   Dist cc CF @ 2' 20/15 -   Dist ph cc NI    Correction: Glasses       Tonometry (Tonopen, 9:59 AM)      Right Left   Pressure 12 13       Pupils      Dark Light Shape React APD   Right 3 5 Round Brisk +3   Left 3 2 Round Brisk None       Visual Fields (Counting fingers)      Left Right    Full    Restrictions  Partial outer superior temporal, inferior temporal, superior nasal, inferior nasal deficiencies       Neuro/Psych    Oriented x3: Yes   Mood/Affect: Normal       Dilation    Both eyes: 1.0% Mydriacyl, 2.5% Phenylephrine @ 9:59 AM        Slit Lamp and Fundus Exam    Slit Lamp Exam      Right Left   Lids/Lashes Dermatochalasis - upper lid, Meibomian gland dysfunction, Scurf Meibomian gland dysfunction, mild Telangiectasia   Conjunctiva/Sclera Subconjunctival hemorrhage ST quad, temporal Pinguecula White and quiet   Cornea Clear Clear   Anterior Chamber Deep and quiet, no cell or flare Deep and quiet   Iris Round and dilated, +NVI greatest nasally -- regressing Round and dilated, No NVI   Lens 2+ Nuclear sclerosis, 2+ Cortical cataract 1-2+ Nuclear sclerosis, 1-2+ Cortical cataract   Vitreous Mild Vitreous syneresis, mild cell/pigment Mild Vitreous syneresis       Fundus Exam      Right Left    Disc 3+ Pallor, mild fibrosis, +PPA Pink and Sharp   C/D Ratio 0.8 0.3   Macula Blunted foveal reflex, large central pigment clump, RPE mottling and clumping, +edema, scattered exudate. +IRH inferiorly Flat, Good foveal reflex, No heme or edema   Vessels Severe attenuation and sclerosis, peripheral sheathing, Copper wiring Mild Vascular attenuation, mild Tortuousity   Periphery Attached, scattered pigment clumping, sclerotic vessels with sheathing, focal cluster of DBH at 0315 with surrounding circinate exudate, pigmented fibrotic scarring superiorly, +edema, good 360 PRP changes except for a few focal areas of edema temporally and nasally; Mild interval increase in [place] at 1030 and mild interval improvement in [facility] nasally Attached; no heme or exudate            IMAGING AND PROCEDURES  Imaging and Procedures for @TODAY @  OCT, Retina - OU - Both Eyes       Right Eye Quality was good. Central Foveal Thickness: 238. Progression has improved. Findings include abnormal foveal contour, epiretinal membrane, preretinal fibrosis, outer retinal atrophy, subretinal hyper-reflective material, no SRF, intraretinal fluid (mild interval improvement in IRF inferior periphery caught on widefield).   Left Eye Quality was good. Central Foveal Thickness: 292. Progression has been stable. Findings include normal foveal contour, no IRF, no SRF, vitreomacular adhesion .   Notes *Images captured and stored on drive  Diagnosis / Impression:  OD: diffuse atrophy and peripheral IRF -- mild interval improvement in IRF inferior periphery caught on widefield  OS: NFP, no IRF/SRF  Clinical management:  See below  Abbreviations: NFP - Normal foveal profile. CME - cystoid macular edema. PED - pigment epithelial detachment. IRF - intraretinal fluid. SRF - subretinal fluid. EZ - ellipsoid zone. ERM - epiretinal membrane. ORA - outer retinal  atrophy. ORT - outer retinal tubulation. SRHM - subretinal hyper-reflective  material        Fluorescein Angiography Optos (Transit OD)       Right Eye   Progression has improved. Early phase findings include delayed filling, vascular perfusion defect, staining (Severely delayed filling time, large areas of vascular non-perfusion greatest SN and temporal periphery). Mid/Late phase findings include vascular perfusion defect, staining, leakage (Interval improvement in leakage; mild, persistent leakage temporal periphery).   Left Eye   Progression has been stable. Early phase findings include normal observations. Mid/Late phase findings include normal observations.   Notes **Images stored on drive**  Impression: OD: Coats disease with interval improvement in vascular leakage, post PRP laser OS: normal study                  ASSESSMENT/PLAN:    ICD-10-CM   1. Coats' disease of right eye  H35.021   2. Retinal edema  H35.81 OCT, Retina - OU - Both Eyes  3. Proliferative retinopathy, non-diabetic, right  H35.21   4. Retinal neovascularization of right eye  H35.051   5. Iris neovascularization, right  H21.1X1   6. Neovascular glaucoma of right eye, severe stage  H40.51X3   7. Essential hypertension  I10   8. Hypertensive retinopathy of both eyes  H35.033 Fluorescein Angiography Optos (Transit OD)  9. Diabetes mellitus type 2 without retinopathy ([place])  E11.9   10. Combined forms of age-related cataract of both eyes  H25.813    1,2. Coats Disease OD  - Pt reports longstanding low vision and "inflammation" which resulted in low vision since early childhood  - pt states that mother reports early diagnosis of Coats Disease in pt  - denies eye pain  - exam shows extensive vascular sheathing and exudative disease  - FA 1.24.20 shows severe peripheral vascular nonperfusion and leakage at border of perfusion/nonperfusion  - s/p PRP OD (01.20.21)  - FA 3.24.21 shows interval improvement in vascular leakage post PRP laser  - f/u 2-3 months  3-6.  Proliferative retinopathy w/ retinal and iris neovascularization and Neovascular Glaucoma OD  - secondary to long standing retinal vasculitis / Coats disease  - IOP improved to 12 today -- on Combigan BID per Dr. Ellin Mayhew -- ok to decrease to Qdaily  - s/p PRP OD as above  - exam shows early regression of NVI and retinal NV  - discussed findings  - f/u in 2-3 months  7,8. Hypertensive retinopathy OU  - discussed importance of tight BP control  - monitor  9. Diabetes mellitus, type 2 without retinopathy  - The incidence, risk factors for progression, natural history and treatment options for diabetic retinopathy  were discussed with patient.    - The need for close monitoring of blood glucose, blood pressure, and serum lipids, avoiding cigarette or any type of tobacco, and the need for long term follow up was also discussed with patient.  - monitor  10. Mixed form age related cataract OU  - The symptoms of cataract, surgical options, and treatments and risks were discussed with patient.  - discussed diagnosis and progression  - not yet visually significant  - monitor for now   Ophthalmic Meds Ordered this visit:  No orders of the defined types were placed in this encounter.      Return for f/u 2-3 months, coats disease OD, DFE, OCT.  There are no Patient Instructions on file for this visit.   Explained the diagnoses, plan, and follow up  with the patient and they expressed understanding.  Patient expressed understanding of the importance of proper follow up care.   This document serves as a record of services personally performed by Gardiner Sleeper, MD, PhD. It was created on their behalf by Ernest Mallick, OA, an ophthalmic assistant. The creation of this record is the provider's dictation and/or activities during the visit.    Electronically signed by: Ernest Mallick, OA 03.24.2021 12:35 PM  Gardiner Sleeper, M.D., Ph.D. Diseases & Surgery of the Retina and [place] 11/21/2019   I have reviewed the above documentation for accuracy and completeness, and I agree with the above. Gardiner Sleeper, M.D., Ph.D. 11/21/19 12:35 PM   Abbreviations: M myopia (nearsighted); A astigmatism; H hyperopia (farsighted); P presbyopia; Mrx spectacle prescription;  CTL contact lenses; OD right eye; OS left eye; OU both eyes  XT exotropia; ET esotropia; PEK punctate epithelial keratitis; PEE punctate epithelial erosions; DES dry eye syndrome; MGD meibomian gland dysfunction; ATs artificial tears; PFAT's preservative free artificial tears; [place] nuclear sclerotic cataract; PSC posterior subcapsular cataract; ERM epi-retinal membrane; PVD posterior vitreous detachment; RD retinal detachment; DM diabetes mellitus; DR diabetic retinopathy; NPDR non-proliferative diabetic retinopathy; PDR proliferative diabetic retinopathy; CSME clinically significant macular edema; DME diabetic macular edema; dbh dot blot hemorrhages; CWS cotton wool spot; POAG primary open angle glaucoma; C/D cup-to-disc ratio; HVF humphrey visual field; GVF goldmann visual field; OCT optical coherence tomography; IOP intraocular pressure; BRVO Branch retinal vein occlusion; CRVO central retinal vein occlusion; CRAO central retinal artery occlusion; BRAO branch retinal artery occlusion; RT retinal tear; SB scleral buckle; PPV pars plana vitrectomy; VH Vitreous hemorrhage; PRP panretinal laser photocoagulation; IVK intravitreal kenalog; VMT vitreomacular traction; MH Macular hole;  NVD neovascularization of the disc; NVE neovascularization elsewhere; AREDS age related eye disease study; ARMD age related macular degeneration; POAG primary open angle glaucoma; EBMD epithelial/anterior basement membrane dystrophy; ACIOL anterior chamber intraocular lens; IOL intraocular lens; PCIOL posterior chamber intraocular lens; Phaco/IOL phacoemulsification with intraocular lens placement; [place] photorefractive keratectomy;  LASIK laser assisted in situ keratomileusis; HTN hypertension; DM diabetes mellitus; COPD chronic obstructive pulmonary disease

## 2019-11-21 ENCOUNTER — Ambulatory Visit (INDEPENDENT_AMBULATORY_CARE_PROVIDER_SITE_OTHER): Payer: 59 | Admitting: Ophthalmology

## 2019-11-21 ENCOUNTER — Encounter (INDEPENDENT_AMBULATORY_CARE_PROVIDER_SITE_OTHER): Payer: Self-pay | Admitting: Ophthalmology

## 2019-11-21 ENCOUNTER — Other Ambulatory Visit: Payer: Self-pay

## 2019-11-21 DIAGNOSIS — H35051 Retinal neovascularization, unspecified, right eye: Secondary | ICD-10-CM | POA: Diagnosis not present

## 2019-11-21 DIAGNOSIS — H3521 Other non-diabetic proliferative retinopathy, right eye: Secondary | ICD-10-CM | POA: Diagnosis not present

## 2019-11-21 DIAGNOSIS — H35021 Exudative retinopathy, right eye: Secondary | ICD-10-CM

## 2019-11-21 DIAGNOSIS — H35033 Hypertensive retinopathy, bilateral: Secondary | ICD-10-CM

## 2019-11-21 DIAGNOSIS — H3581 Retinal edema: Secondary | ICD-10-CM

## 2019-11-21 DIAGNOSIS — H211X1 Other vascular disorders of iris and ciliary body, right eye: Secondary | ICD-10-CM

## 2019-11-21 DIAGNOSIS — I10 Essential (primary) hypertension: Secondary | ICD-10-CM

## 2019-11-21 DIAGNOSIS — E119 Type 2 diabetes mellitus without complications: Secondary | ICD-10-CM

## 2019-11-21 DIAGNOSIS — H4051X3 Glaucoma secondary to other eye disorders, right eye, severe stage: Secondary | ICD-10-CM

## 2019-11-21 DIAGNOSIS — H25813 Combined forms of age-related cataract, bilateral: Secondary | ICD-10-CM

## 2019-12-05 ENCOUNTER — Ambulatory Visit: Payer: 59 | Attending: Internal Medicine

## 2019-12-05 DIAGNOSIS — Z23 Encounter for immunization: Secondary | ICD-10-CM

## 2019-12-05 NOTE — Progress Notes (Signed)
   Covid-19 Vaccination Clinic  Name:  FALON HARBOTTLE    MRN: [id number] DOB: [birth year]  12/05/2019  Mr. Iwanicki was observed post Covid-19 immunization for 15 minutes without incident. He was provided with Vaccine Information Sheet and instruction to access the V-Safe system.   Mr. Pellegrini was instructed to call 911 with any severe reactions post vaccine: [name] Difficulty breathing  . Swelling of face and throat  . A fast heartbeat  . A bad rash all over body  . Dizziness and weakness   Immunizations Administered    Name Date Dose VIS Date Route   Moderna COVID-19 Vaccine 12/05/2019  9:56 AM 0.5 mL 07/31/2019 Intramuscular   Manufacturer: Moderna   Lot: [id number]   [id number]

## 2020-01-09 ENCOUNTER — Telehealth: Payer: Self-pay | Admitting: Internal Medicine

## 2020-01-09 MED ORDER — RIFAXIMIN 550 MG PO TABS: 550.0000 mg | ORAL_TABLET | Freq: Three times a day (TID) | ORAL | 0 refills | Status: AC

## 2020-01-09 NOTE — Telephone Encounter (Signed)
I was contacted by patient via text message: He reports change in GI symptoms over the last 1+ month including foul smelling gas, increased stool frequency, mild bloating.  No abd pain. He is using Benefiber and Beano.  A/P: Possible IBS with diarrhea versus SIBO flare.  Hx of sigmoidectomy and diverticulosis.  Not consistent with prior diverticulitis.  Would rec rifaximin 550 mg TID x 14 days for IBS-D.  Will likely need prior authorization and if cost prohibitive, let me know and can try cipro 500 mg BID x 10 days.  Please let him know I received his message and we are working on the Rx.  I did not respond via text but rather chose to communicated via EMR for documentation purposes  Thanks

## 2020-01-09 NOTE — Telephone Encounter (Signed)
[facility] denied patient's rx for Xifaxan. Patient must have failed or cannot take two of the following: 1.Antispasmotic agent (for example, Bentyl       (dicycomine)  2.Antidiarrheal agent (for example, loperamide).  3.Tricyclic antidepressant (for example, amitriptyline)   Dr Hilarie Fredrickson, would you like me to go ahead and send cipro?

## 2020-01-09 NOTE — Addendum Note (Signed)
Addended by: Larina Bras on: 01/09/2020 10:41 AM   Modules accepted: Orders

## 2020-01-14 MED ORDER — CIPROFLOXACIN HCL 500 MG PO TABS: 500.0000 mg | ORAL_TABLET | Freq: Two times a day (BID) | ORAL | 0 refills | Status: DC

## 2020-01-14 NOTE — Telephone Encounter (Signed)
Ok to proceed with cipro 500 mg bid x 10 days

## 2020-01-14 NOTE — Addendum Note (Signed)
Addended by: Larina Bras on: 01/14/2020 05:19 PM   Modules accepted: Orders

## 2020-01-14 NOTE — Telephone Encounter (Signed)
Rx sent 

## 2020-01-18 NOTE — Progress Notes (Addendum)
[facility] Note  01/23/2020     CHIEF COMPLAINT Patient presents for Retina Follow Up   HISTORY OF PRESENT ILLNESS: Jorge Peterson is a 59 y.o. male who presents to the clinic today for:   HPI    Retina Follow Up    Patient presents with  Other.  In right eye.  Duration of 2 months.  Since onset it is stable.  I, the attending physician,  performed the HPI with the patient and updated documentation appropriately.          Comments    2 month follow up Coats Disease OD-  Vision appears stable since last visit OU.  Denies any new problems.  BS: 151 this am, A1C: unsure Combigan OD qam per pt.       Last edited by Bernarda Caffey, MD on 01/23/2020  2:56 PM. (History)    pt states his vision and overall health are doing well  Referring physician: Dwain Sarna, OD [place],  [place] [address]  HISTORICAL INFORMATION:   Selected notes from the MEDICAL RECORD NUMBER Referred by Dr. Ellin Mayhew for retinal vasculitis OD   CURRENT MEDICATIONS: Current Outpatient Medications (Ophthalmic Drugs)  Medication Sig  . brimonidine-timolol (COMBIGAN) 0.2-0.5 % ophthalmic solution Place 1 drop into the right eye every 12 (twelve) hours.  . brimonidine-timolol (COMBIGAN) 0.2-0.5 % ophthalmic solution Combigan 0.2 %-0.5 % eye drops   No current facility-administered medications for this visit. (Ophthalmic Drugs)   Current Outpatient Medications (Other)  Medication Sig  . acetaminophen (TYLENOL) 500 MG tablet Take 2 tablets (1,000 mg total) by mouth every 6 (six) hours as needed.  . Biotin 5000 MCG TABS Take 800 mcg by mouth daily.   . budesonide-formoterol (SYMBICORT) 160-4.5 MCG/ACT inhaler Inhale into the lungs.  . ciprofloxacin (CIPRO) 500 MG tablet Take 1 tablet (500 mg total) by mouth 2 (two) times daily.  . Coenzyme Q10 (COQ10 PO) Take 120 mg by mouth every evening.  [name] ELIQUIS 5 MG TABS tablet TAKE 1 TABLET BY MOUTH TWICE A DAY  . fluticasone  (FLONASE) 50 MCG/ACT nasal spray Place 1 spray into the nose daily as needed for allergies.   . folic acid (FOLVITE) A999333 MCG tablet Take 400 mcg by mouth daily.   . furosemide (LASIX) 40 MG tablet Take 1 tablet (40 mg total) by mouth daily. Take for 3 days  . hydrochlorothiazide (HYDRODIURIL) 12.5 MG tablet Take 12.5 mg by mouth daily.  . influenza vac split quadrivalent PF (FLUZONE QUADRIVALENT) 0.5 ML injection Fluzone Quad 2020-2021 (PF) 60 mcg (15 mcg x 4)/0.5 mL IM syringe  PHARMACY ADMINISTERED  . losartan (COZAAR) 100 MG tablet Take 100 mg by mouth every evening.   . metFORMIN (GLUCOPHAGE-XR) 750 MG 24 hr tablet Take 1,000 mg by mouth daily.  . Multiple Vitamin (MULTIVITAMIN WITH MINERALS) TABS tablet Take 1 tablet by mouth daily. Centrum Silver  . Omega-3 Fatty Acids (FISH OIL PO) Take 1 capsule by mouth 2 (two) times daily.  [name] omeprazole (PRILOSEC) 40 MG capsule Take 1 capsule (40 mg total) by mouth daily.  . predniSONE (DELTASONE) 5 MG tablet Take 5 mg by mouth daily.  . simvastatin (ZOCOR) 20 MG tablet Take 1 tablet (20 mg total) by mouth at bedtime.   No current facility-administered medications for this visit. (Other)      REVIEW OF SYSTEMS: ROS    Positive for: Gastrointestinal, Endocrine, Cardiovascular, Eyes   Negative for: Constitutional, Neurological,  Skin, Genitourinary, Musculoskeletal, HENT, Psychiatric, Allergic/Imm, Heme/Lymph   Last edited by Leonie Douglas, COA on 01/23/2020  2:15 PM. (History)       ALLERGIES Allergies  Allergen Reactions  . Codeine Itching and Rash  . Latex Itching and Rash  . Oxycodone-Acetaminophen Itching and Rash    PAST MEDICAL HISTORY Past Medical History:  Diagnosis Date  . Atrial fibrillation ([place])    s/p afib ablation 7/12 and 11/20  . Bradycardia    asymptomatic  . Cataract    Mixed OU  . Colonic diverticular abscess   . Diabetes ([place])   . Diverticulitis   . Elevated LFTs   . Fall   . Family history of anesthesia  complication    father gets pneumonia after "  . GERD (gastroesophageal reflux disease)   . Hyperlipidemia   . Hyperplastic colon polyp   . Hypertension   . Hypertensive retinopathy    OU  . Internal hemorrhoids   . Tinnitus   . Tubular adenoma of colon   . Wears glasses    Past Surgical History:  Procedure Laterality Date  . ATRIAL ABLATION SURGERY  03/24/11   PVI by Greggory Brandy  . ATRIAL FIBRILLATION ABLATION N/A 07/24/2019   Procedure: ATRIAL FIBRILLATION ABLATION;  Surgeon: [name], MD;  Location: [facility];  Service: Cardiovascular;  Laterality: N/A;  . BACK SURGERY    . BRONCHOSCOPY  01/23/2013    biopsy  . CHOLECYSTECTOMY  05/02/2014   LAP CHOLE  . CHOLECYSTECTOMY N/A 05/02/2014   Procedure: LAPAROSCOPIC CHOLECYSTECTOMY WITH INTRAOPERATIVE CHOLANGIOGRAM;  Surgeon: Erroll Luna, MD;  Location: [place];  Service: General;  Laterality: N/A;  . EP IMPLANTABLE DEVICE N/A 06/27/2015   Procedure: Loop Recorder Insertion;  Surgeon: [name], MD;  Location: [facility];  Service: Cardiovascular;  Laterality: N/A;  . ERCP N/A 05/25/2014   Procedure: ENDOSCOPIC RETROGRADE CHOLANGIOPANCREATOGRAPHY (ERCP);  Surgeon: [name], MD;  Location: [place] [facility];  Service: Endoscopy;  Laterality: N/A;  . Fistula surgery    . LOOP RECORDER REMOVAL N/A 07/24/2019   Procedure: LOOP RECORDER REMOVAL;  Surgeon: [name], MD;  Location: [place] [facility];  Service: Cardiovascular;  Laterality: N/A;  . MEDIASTINOSCOPY N/A 01/23/2013   Procedure: MEDIASTINOSCOPY;  Surgeon: Ivin Poot, MD;  Location: [facility];  Service: Thoracic;  Laterality: N/A;  . QUADRICEPS TENDON REPAIR Right 01/13/2016   Procedure: REPAIR QUADRICEP TENDON;  Surgeon: Latanya Maudlin, MD;  [facility];  Service: Orthopedics;  Laterality: Right;  Received femoral nerve block in holding  . SHOULDER SURGERY     x 2  . TONSILLECTOMY    . VIDEO BRONCHOSCOPY WITH ENDOBRONCHIAL ULTRASOUND N/A 01/23/2013    Procedure: VIDEO BRONCHOSCOPY WITH ENDOBRONCHIAL ULTRASOUND;  Surgeon: Ivin Poot, MD;  Location: [facility];  Service: Thoracic;  Laterality: N/A;    FAMILY HISTORY Family History  Problem Relation Age of Onset  . Liver disease Mother        fatty liver  . Diabetes Father   . Heart disease Father        CABG at 103  . Colon cancer Neg Hx   . Stomach cancer Neg Hx   . Pancreatic cancer Neg Hx   . Esophageal cancer Neg Hx     SOCIAL HISTORY Social History   Tobacco Use  . Smoking status: Never Smoker  . Smokeless tobacco: Never Used  Substance Use Topics  . Alcohol use: No  . Drug use: No  OPHTHALMIC EXAM:  Base Eye Exam    Visual Acuity (Snellen - Linear)      Right Left   Dist cc CF1' temp 20/20   Correction: Glasses       Tonometry (Tonopen, 2:23 PM)      Right Left   Pressure 11 14       Pupils      Shape React APD   Right Round Brisk +3   Left Round Brisk None       Visual Fields      Left Right    Full    Restrictions  Total superior nasal, inferior nasal deficiencies; Partial outer inferior temporal deficiency       Extraocular Movement      Right Left    Full Full       Neuro/Psych    Oriented x3: Yes   Mood/Affect: Normal       Dilation    Both eyes: 1.0% Mydriacyl, 2.5% Phenylephrine @ 2:23 PM        Slit Lamp and Fundus Exam    Slit Lamp Exam      Right Left   Lids/Lashes Dermatochalasis - upper lid, Meibomian gland dysfunction, Scurf Meibomian gland dysfunction, mild Telangiectasia   Conjunctiva/Sclera Subconjunctival hemorrhage ST quad, temporal Pinguecula White and quiet   Cornea Clear Clear   Anterior Chamber Deep and quiet, no cell or flare Deep and quiet   Iris Round and dilated, +NVI greatest nasally -- regressing Round and dilated, No NVI   Lens 2+ Nuclear sclerosis, 2+ Cortical cataract 2+ Nuclear sclerosis, 2+ Cortical cataract   Vitreous Mild Vitreous syneresis, mild cell/pigment, Posterior vitreous  detachment, vitreous condensations Mild Vitreous syneresis       Fundus Exam      Right Left   Disc 3+ Pallor, mild fibrosis, +PPA, +cupping Pink and Sharp   C/D Ratio 0.8 0.3   Macula Blunted foveal reflex, large central pigment clump, RPE mottling and clumping, +edema, scattered exudate, +IRH inferiorly, pre-retinal fibrosis temporal macula, scattered sub-retinal fibrosis Flat, Good foveal reflex, No heme or edema   Vessels attenuation, +sheathing attenuation, mild Tortuousity   Periphery Attached, scattered pigment clumping, sclerotic vessels with sheathing, focal cluster of DBH at 0315 with surrounding circinate exudate - slightly improved, pigmented fibrotic scarring superiorly, +edema, good 360 PRP changes except for a few focal areas of edema temporally and nasally; Mild interval improvement in [facility] Attached; no heme or exudate          Refraction    Wearing Rx      Sphere Cylinder Axis   Right [place]     Left -1.00 +1.00 175          IMAGING AND PROCEDURES  Imaging and Procedures for @TODAY @  OCT, Retina - OU - Both Eyes       Right Eye Quality was good. Central Foveal Thickness: 254. Progression has been stable. Findings include abnormal foveal contour, epiretinal membrane, preretinal fibrosis, outer retinal atrophy, subretinal hyper-reflective material, no SRF, intraretinal fluid (Stable improvement in peripheral IRF caught on widefield).   Left Eye Quality was good. Central Foveal Thickness: 290. Progression has been stable. Findings include normal foveal contour, no IRF, no SRF, vitreomacular adhesion .   Notes *Images captured and stored on drive  Diagnosis / Impression:  OD: diffuse atrophy and peripheral IRF -- Stable improvement in peripheral IRF caught on widefield OS: NFP, no IRF/SRF  Clinical management:  See below  Abbreviations: NFP - Normal foveal  profile. CME - cystoid macular edema. PED - pigment epithelial detachment. IRF - intraretinal fluid. SRF  - subretinal fluid. EZ - ellipsoid zone. ERM - epiretinal membrane. ORA - outer retinal atrophy. ORT - outer retinal tubulation. SRHM - subretinal hyper-reflective material                 ASSESSMENT/PLAN:    ICD-10-CM   1. Coats' disease of right eye  H35.021   2. Retinal edema  H35.81 OCT, Retina - OU - Both Eyes  3. Proliferative retinopathy, non-diabetic, right  H35.21   4. Retinal neovascularization of right eye  H35.051   5. Iris neovascularization, right  H21.1X1   6. Neovascular glaucoma of right eye, severe stage  H40.51X3   7. Essential hypertension  I10   8. Hypertensive retinopathy of both eyes  H35.033   9. Diabetes mellitus type 2 without retinopathy ([place])  E11.9   10. Combined forms of age-related cataract of both eyes  H25.813    1,2. Coats Disease OD  - Pt reports longstanding low vision and "inflammation" which resulted in low vision since early childhood  - pt states that mother reports early diagnosis of Coats Disease in pt  - denies eye pain  - exam shows extensive vascular sheathing and exudative disease  - FA 1.24.20 shows severe peripheral vascular nonperfusion and leakage at border of perfusion/nonperfusion  - s/p PRP OD (01.20.21)  - FA 3.24.21 shows interval improvement in vascular leakage post PRP laser  - f/u 6 months  3-6. Proliferative retinopathy w/ retinal and iris neovascularization and Neovascular Glaucoma OD  - secondary to long standing retinal vasculitis / Coats disease  - IOP improved to 11 today -- on Combigan QDaily per Dr. Ellin Mayhew  - s/p PRP OD as above  - exam shows regression of NVI and retinal NV  - discussed findings  - f/u in 6 months  7,8. Hypertensive retinopathy OU  - discussed importance of tight BP control  - monitor  9. Diabetes mellitus, type 2 without retinopathy  - The incidence, risk factors for progression, natural history and treatment options for diabetic retinopathy  were discussed with patient.    - The  need for close monitoring of blood glucose, blood pressure, and serum lipids, avoiding cigarette or any type of tobacco, and the need for long term follow up was also discussed with patient.  - monitor  10. Mixed form age related cataract OU  - The symptoms of cataract, surgical options, and treatments and risks were discussed with patient.  - discussed diagnosis and progression  - not yet visually significant  - monitor for now   Ophthalmic Meds Ordered this visit:  No orders of the defined types were placed in this encounter.      Return in about 6 months (around 07/25/2020) for f/u coats disease OD, DFE, OCT.  There are no Patient Instructions on file for this visit.   Explained the diagnoses, plan, and follow up with the patient and they expressed understanding.  Patient expressed understanding of the importance of proper follow up care.   This document serves as a record of services personally performed by Gardiner Sleeper, MD, PhD. It was created on their behalf by Ernest Mallick, OA, an ophthalmic assistant. The creation of this record is the provider's dictation and/or activities during the visit.    Electronically signed by: Ernest Mallick, OA 05.26.2021 11:42 PM  Gardiner Sleeper, M.D., Ph.D. Diseases & Surgery of the Retina and Vitreous  [place] 01/23/2020   I have reviewed the above documentation for accuracy and completeness, and I agree with the above. Gardiner Sleeper, M.D., Ph.D. 01/24/20 11:42 PM   Abbreviations: M myopia (nearsighted); A astigmatism; H hyperopia (farsighted); P presbyopia; Mrx spectacle prescription;  CTL contact lenses; OD right eye; OS left eye; OU both eyes  XT exotropia; ET esotropia; PEK punctate epithelial keratitis; PEE punctate epithelial erosions; DES dry eye syndrome; MGD meibomian gland dysfunction; ATs artificial tears; PFAT's preservative free artificial tears; [place] nuclear sclerotic cataract; PSC posterior  subcapsular cataract; ERM epi-retinal membrane; PVD posterior vitreous detachment; RD retinal detachment; DM diabetes mellitus; DR diabetic retinopathy; NPDR non-proliferative diabetic retinopathy; PDR proliferative diabetic retinopathy; CSME clinically significant macular edema; DME diabetic macular edema; dbh dot blot hemorrhages; CWS cotton wool spot; POAG primary open angle glaucoma; C/D cup-to-disc ratio; HVF humphrey visual field; GVF goldmann visual field; OCT optical coherence tomography; IOP intraocular pressure; BRVO Branch retinal vein occlusion; CRVO central retinal vein occlusion; CRAO central retinal artery occlusion; BRAO branch retinal artery occlusion; RT retinal tear; SB scleral buckle; PPV pars plana vitrectomy; VH Vitreous hemorrhage; PRP panretinal laser photocoagulation; IVK intravitreal kenalog; VMT vitreomacular traction; MH Macular hole;  NVD neovascularization of the disc; NVE neovascularization elsewhere; AREDS age related eye disease study; ARMD age related macular degeneration; POAG primary open angle glaucoma; EBMD epithelial/anterior basement membrane dystrophy; ACIOL anterior chamber intraocular lens; IOL intraocular lens; PCIOL posterior chamber intraocular lens; Phaco/IOL phacoemulsification with intraocular lens placement; [place] photorefractive keratectomy; LASIK laser assisted in situ keratomileusis; HTN hypertension; DM diabetes mellitus; COPD chronic obstructive pulmonary disease

## 2020-01-21 ENCOUNTER — Telehealth (INDEPENDENT_AMBULATORY_CARE_PROVIDER_SITE_OTHER): Payer: 59 | Admitting: Internal Medicine

## 2020-01-21 ENCOUNTER — Encounter: Payer: Self-pay | Admitting: Internal Medicine

## 2020-01-21 VITALS — BP 126/82 | HR 58 | Ht 67.0 in | Wt 225.0 lb

## 2020-01-21 DIAGNOSIS — I480 Paroxysmal atrial fibrillation: Secondary | ICD-10-CM

## 2020-01-21 DIAGNOSIS — D6869 Other thrombophilia: Secondary | ICD-10-CM

## 2020-01-21 DIAGNOSIS — I119 Hypertensive heart disease without heart failure: Secondary | ICD-10-CM | POA: Diagnosis not present

## 2020-01-21 NOTE — Progress Notes (Signed)
Electrophysiology TeleHealth Note   Due to national recommendations of social distancing due to COVID 19, an audio/video telehealth visit is felt to be most appropriate for this patient at this time.  See MyChart message from today for the patient's consent to telehealth for [facility].  Date:  01/21/2020   ID:  Jorge Peterson, DOB 23-Feb-1961, MRN [id number]  Location: patient's home  Provider location:  [place]  Evaluation Performed: Follow-up visit  PCP:  [name], MD   Electrophysiologist:  Dr Rayann Heman  Chief Complaint:  palpitations  History of Present Illness:    Jorge Peterson is a 59 y.o. male who presents via telehealth conferencing today.  Since last being seen in our clinic, the patient reports doing very well.  Today, he denies symptoms of palpitations, chest pain, shortness of breath,  lower extremity edema, dizziness, presyncope, or syncope.  The patient is otherwise without complaint today.   Past Medical History:  Diagnosis Date  . Atrial fibrillation ([place])    s/p afib ablation 7/12 and 11/20  . Bradycardia    asymptomatic  . Cataract    Mixed OU  . Colonic diverticular abscess   . Diabetes ([place])   . Diverticulitis   . Elevated LFTs   . Fall   . Family history of anesthesia complication    father gets pneumonia after "  . GERD (gastroesophageal reflux disease)   . Hyperlipidemia   . Hyperplastic colon polyp   . Hypertension   . Hypertensive retinopathy    OU  . Internal hemorrhoids   . Tinnitus   . Tubular adenoma of colon   . Wears glasses     Past Surgical History:  Procedure Laterality Date  . ATRIAL ABLATION SURGERY  03/24/11   PVI by Greggory Brandy  . ATRIAL FIBRILLATION ABLATION N/A 07/24/2019   Procedure: ATRIAL FIBRILLATION ABLATION;  Surgeon: [name], MD;  Location: [facility];  Service: Cardiovascular;  Laterality: N/A;  . BACK SURGERY    . BRONCHOSCOPY  01/23/2013    biopsy  . CHOLECYSTECTOMY  05/02/2014    LAP CHOLE  . CHOLECYSTECTOMY N/A 05/02/2014   Procedure: LAPAROSCOPIC CHOLECYSTECTOMY WITH INTRAOPERATIVE CHOLANGIOGRAM;  Surgeon: Erroll Luna, MD;  Location: [facility];  Service: General;  Laterality: N/A;  . EP IMPLANTABLE DEVICE N/A 06/27/2015   Procedure: Loop Recorder Insertion;  Surgeon: [name], MD;  Location: [facility];  Service: Cardiovascular;  Laterality: N/A;  . ERCP N/A 05/25/2014   Procedure: ENDOSCOPIC RETROGRADE CHOLANGIOPANCREATOGRAPHY (ERCP);  Surgeon: [name], MD;  Location: [facility];  Service: Endoscopy;  Laterality: N/A;  . Fistula surgery    . LOOP RECORDER REMOVAL N/A 07/24/2019   Procedure: LOOP RECORDER REMOVAL;  Surgeon: [name], MD;  Location: [facility];  Service: Cardiovascular;  Laterality: N/A;  . MEDIASTINOSCOPY N/A 01/23/2013   Procedure: MEDIASTINOSCOPY;  Surgeon: Ivin Poot, MD;  Location: [facility];  Service: Thoracic;  Laterality: N/A;  . QUADRICEPS TENDON REPAIR Right 01/13/2016   Procedure: REPAIR QUADRICEP TENDON;  Surgeon: Latanya Maudlin, MD;  [facility];  Service: Orthopedics;  Laterality: Right;  Received femoral nerve block in holding  . SHOULDER SURGERY     x 2  . TONSILLECTOMY    . VIDEO BRONCHOSCOPY WITH ENDOBRONCHIAL ULTRASOUND N/A 01/23/2013   Procedure: VIDEO BRONCHOSCOPY WITH ENDOBRONCHIAL ULTRASOUND;  Surgeon: Ivin Poot, MD;  Location: [facility];  Service: Thoracic;  Laterality: N/A;    Current Outpatient Medications  Medication Sig Dispense Refill  . acetaminophen (TYLENOL) 500 MG tablet Take 2 tablets (1,000 mg total) by mouth every 6 (six) hours as needed. 30 tablet 0  . Biotin 5000 MCG TABS Take 800 mcg by mouth daily.     . brimonidine-timolol (COMBIGAN) 0.2-0.5 % ophthalmic solution Place 1 drop into the right eye every 12 (twelve) hours. 3 mL 10  . brimonidine-timolol (COMBIGAN) 0.2-0.5 % ophthalmic solution Combigan 0.2 %-0.5 % eye drops    . budesonide-formoterol (SYMBICORT) 160-4.5  MCG/ACT inhaler Inhale into the lungs.    . ciprofloxacin (CIPRO) 500 MG tablet Take 1 tablet (500 mg total) by mouth 2 (two) times daily. 20 tablet 0  . Coenzyme Q10 (COQ10 PO) Take 120 mg by mouth every evening.    [name] ELIQUIS 5 MG TABS tablet TAKE 1 TABLET BY MOUTH TWICE A DAY 60 tablet 10  . fluticasone (FLONASE) 50 MCG/ACT nasal spray Place 1 spray into the nose daily as needed for allergies.     . folic acid (FOLVITE) A999333 MCG tablet Take 400 mcg by mouth daily.     . furosemide (LASIX) 40 MG tablet Take 1 tablet (40 mg total) by mouth daily. Take for 3 days 5 tablet 0  . hydrochlorothiazide (HYDRODIURIL) 12.5 MG tablet Take 12.5 mg by mouth daily.  3  . influenza vac split quadrivalent PF (FLUZONE QUADRIVALENT) 0.5 ML injection Fluzone Quad 2020-2021 (PF) 60 mcg (15 mcg x 4)/0.5 mL IM syringe  PHARMACY ADMINISTERED    . losartan (COZAAR) 100 MG tablet Take 100 mg by mouth every evening.   3  . metFORMIN (GLUCOPHAGE-XR) 750 MG 24 hr tablet Take 1,000 mg by mouth daily.    . Multiple Vitamin (MULTIVITAMIN WITH MINERALS) TABS tablet Take 1 tablet by mouth daily. Centrum Silver    . Omega-3 Fatty Acids (FISH OIL PO) Take 1 capsule by mouth 2 (two) times daily.    [name] omeprazole (PRILOSEC) 40 MG capsule Take 1 capsule (40 mg total) by mouth daily. 90 capsule 3  . predniSONE (DELTASONE) 5 MG tablet Take 5 mg by mouth daily.    . rifaximin (XIFAXAN) 550 MG TABS tablet Take 1 tablet (550 mg total) by mouth 3 (three) times daily for 14 days. 42 tablet 0  . simvastatin (ZOCOR) 20 MG tablet Take 1 tablet (20 mg total) by mouth at bedtime. 90 tablet 1   No current facility-administered medications for this visit.    Allergies:   Codeine, Latex, and Oxycodone-acetaminophen   Social History:  The patient  reports that he has never smoked. He has never used smokeless tobacco. He reports that he does not drink alcohol or use drugs.   ROS:  Please see the history of present illness.   All other systems  are personally reviewed and negative.    Exam:    Vital Signs:  BP 126/82   Pulse (!) 58   Ht 5\' 7"  (1.702 m)   Wt 225 lb (102.1 kg)   SpO2 97%   BMI 35.24 kg/m   Well sounding and appearing, alert and conversant, regular work of breathing,  good skin color Eyes- anicteric, neuro- grossly intact, skin- no apparent rash or lesions or cyanosis, mouth- oral mucosa is pink  Labs/Other Tests and Data Reviewed:    Recent Labs: 07/20/2019: BUN 21; Creatinine, Ser 1.07; Hemoglobin 14.2; Platelets 191; Potassium 3.9; Sodium 140   Wt Readings from Last 3 Encounters:  01/21/20 225 lb (102.1 kg)  11/05/19 232 lb (105.2  kg)  10/22/19 225 lb (102.1 kg)       ASSESSMENT & PLAN:    1.  Paroxysmal atrial fibrillation Doing great post ablation off AAD therapy chads2vasc score is 2.  He is on eliquis  2. Hypertensive cardiovascular disease Stable No change required today  3. Bradycardia Improved post ablation  4. HL Continue zocor   Risks, benefits and potential toxicities for medications prescribed and/or refilled reviewed with patient today.   Follow-up:  6 months with me in office   Patient Risk:  after full review of this patients clinical status, I feel that they are at moderate risk at this time.  Today, I have spent 15 minutes with the patient with telehealth technology discussing arrhythmia management .    Army Fossa, MD  01/21/2020 10:08 AM     [facility] [address] [place] [address] [phone] (office) [phone] (fax)

## 2020-01-23 ENCOUNTER — Encounter (INDEPENDENT_AMBULATORY_CARE_PROVIDER_SITE_OTHER): Payer: Self-pay | Admitting: Ophthalmology

## 2020-01-23 ENCOUNTER — Ambulatory Visit (INDEPENDENT_AMBULATORY_CARE_PROVIDER_SITE_OTHER): Payer: 59 | Admitting: Ophthalmology

## 2020-01-23 ENCOUNTER — Other Ambulatory Visit: Payer: Self-pay

## 2020-01-23 DIAGNOSIS — H3521 Other non-diabetic proliferative retinopathy, right eye: Secondary | ICD-10-CM | POA: Diagnosis not present

## 2020-01-23 DIAGNOSIS — H35021 Exudative retinopathy, right eye: Secondary | ICD-10-CM | POA: Diagnosis not present

## 2020-01-23 DIAGNOSIS — H211X1 Other vascular disorders of iris and ciliary body, right eye: Secondary | ICD-10-CM

## 2020-01-23 DIAGNOSIS — H25813 Combined forms of age-related cataract, bilateral: Secondary | ICD-10-CM

## 2020-01-23 DIAGNOSIS — I10 Essential (primary) hypertension: Secondary | ICD-10-CM

## 2020-01-23 DIAGNOSIS — H35033 Hypertensive retinopathy, bilateral: Secondary | ICD-10-CM

## 2020-01-23 DIAGNOSIS — H35051 Retinal neovascularization, unspecified, right eye: Secondary | ICD-10-CM

## 2020-01-23 DIAGNOSIS — E119 Type 2 diabetes mellitus without complications: Secondary | ICD-10-CM

## 2020-01-23 DIAGNOSIS — H3581 Retinal edema: Secondary | ICD-10-CM | POA: Diagnosis not present

## 2020-01-23 DIAGNOSIS — H4051X3 Glaucoma secondary to other eye disorders, right eye, severe stage: Secondary | ICD-10-CM

## 2020-02-01 ENCOUNTER — Other Ambulatory Visit: Payer: Self-pay

## 2020-03-26 ENCOUNTER — Other Ambulatory Visit: Payer: Self-pay | Admitting: Internal Medicine

## 2020-04-17 ENCOUNTER — Other Ambulatory Visit: Payer: Self-pay | Admitting: Specialist

## 2020-04-17 DIAGNOSIS — R0602 Shortness of breath: Secondary | ICD-10-CM

## 2020-04-17 DIAGNOSIS — R05 Cough: Secondary | ICD-10-CM

## 2020-04-17 DIAGNOSIS — R053 Chronic cough: Secondary | ICD-10-CM

## 2020-04-24 ENCOUNTER — Ambulatory Visit
Admission: RE | Admit: 2020-04-24 | Discharge: 2020-04-24 | Disposition: A | Discharge status: Home / Self Care | Payer: 59 | Source: Ambulatory Visit | Attending: Specialist | Admitting: Specialist

## 2020-04-24 ENCOUNTER — Other Ambulatory Visit: Payer: Self-pay

## 2020-04-24 DIAGNOSIS — R05 Cough: Secondary | ICD-10-CM | POA: Diagnosis not present

## 2020-04-24 DIAGNOSIS — R0602 Shortness of breath: Secondary | ICD-10-CM | POA: Diagnosis present

## 2020-04-24 DIAGNOSIS — R053 Chronic cough: Secondary | ICD-10-CM

## 2020-06-18 ENCOUNTER — Ambulatory Visit: Payer: 59 | Admitting: Dermatology

## 2020-07-24 ENCOUNTER — Telehealth: Payer: Self-pay | Admitting: Adult Health

## 2020-07-24 NOTE — Telephone Encounter (Signed)
Called and LMOM regarding monoclonal antibody treatment for COVID 19 given to those who are at risk for complications and/or hospitalization of the virus.  Patient meets criteria based on: BMI  Call back number given: [phone]  My chart message: sent  Wilber Bihari, NP

## 2020-07-25 ENCOUNTER — Other Ambulatory Visit: Payer: Self-pay | Admitting: Nurse Practitioner

## 2020-07-25 DIAGNOSIS — I10 Essential (primary) hypertension: Secondary | ICD-10-CM

## 2020-07-25 DIAGNOSIS — U071 COVID-19: Secondary | ICD-10-CM

## 2020-07-25 DIAGNOSIS — I480 Paroxysmal atrial fibrillation: Secondary | ICD-10-CM

## 2020-07-25 DIAGNOSIS — E663 Overweight: Secondary | ICD-10-CM

## 2020-07-25 NOTE — Progress Notes (Signed)
I connected by phone with Jorge Peterson on 07/25/2020 at 10:25 AM to discuss the potential use of a new treatment for mild to moderate COVID-19 viral infection in non-hospitalized patients.  This patient is a 59 y.o. male that meets the FDA criteria for Emergency Use Authorization of COVID monoclonal antibody casirivimab/imdevimab, bamlanivimab/eteseviamb, or sotrovimab.  Has a (+) direct SARS-CoV-2 viral test result  Has mild or moderate COVID-19   Is NOT hospitalized due to COVID-19  Is within 10 days of symptom onset  Has at least one of the high risk factor(s) for progression to severe COVID-19 and/or hospitalization as defined in EUA.  Specific high risk criteria : BMI > 25, Diabetes and Cardiovascular disease or hypertension   I have spoken and communicated the following to the patient or parent/caregiver regarding COVID monoclonal antibody treatment:  1. FDA has authorized the emergency use for the treatment of mild to moderate COVID-19 in adults and pediatric patients with positive results of direct SARS-CoV-2 viral testing who are 17 years of age and older weighing at least 40 kg, and who are at high risk for progressing to severe COVID-19 and/or hospitalization.  2. The significant known and potential risks and benefits of COVID monoclonal antibody, and the extent to which such potential risks and benefits are unknown.  3. Information on available alternative treatments and the risks and benefits of those alternatives, including clinical trials.  4. Patients treated with COVID monoclonal antibody should continue to self-isolate and use infection control measures (e.g., wear mask, isolate, social distance, avoid sharing personal items, clean and disinfect "high touch" surfaces, and frequent handwashing) according to CDC guidelines.   5. The patient or parent/caregiver has the option to accept or refuse COVID monoclonal antibody treatment.  After reviewing this information  with the patient, the patient has agreed to receive one of the available covid 19 monoclonal antibodies and will be provided an appropriate fact sheet prior to infusion. Jobe Gibbon, NP 07/25/2020 10:25 AM

## 2020-07-26 ENCOUNTER — Ambulatory Visit (HOSPITAL_COMMUNITY)
Admission: RE | Admit: 2020-07-26 | Discharge: 2020-07-26 | Disposition: A | Discharge status: Home / Self Care | Payer: 59 | Source: Ambulatory Visit | Attending: Pulmonary Disease | Admitting: Pulmonary Disease

## 2020-07-26 DIAGNOSIS — I10 Essential (primary) hypertension: Secondary | ICD-10-CM | POA: Diagnosis not present

## 2020-07-26 DIAGNOSIS — E663 Overweight: Secondary | ICD-10-CM

## 2020-07-26 DIAGNOSIS — U071 COVID-19: Secondary | ICD-10-CM | POA: Diagnosis present

## 2020-07-26 DIAGNOSIS — I480 Paroxysmal atrial fibrillation: Secondary | ICD-10-CM | POA: Diagnosis not present

## 2020-07-26 MED ORDER — SOTROVIMAB 500 MG/8ML IV SOLN
500.0000 mg | Freq: Once | INTRAVENOUS | Status: AC
  Administered 2020-07-26: 500 mg via INTRAVENOUS

## 2020-07-26 MED ORDER — FAMOTIDINE IN NACL 20-0.9 MG/50ML-% IV SOLN: 20.0000 mg | Freq: Once | INTRAVENOUS | Status: DC | PRN

## 2020-07-26 MED ORDER — ALBUTEROL SULFATE HFA 108 (90 BASE) MCG/ACT IN AERS: 2.0000 | INHALATION_SPRAY | Freq: Once | RESPIRATORY_TRACT | Status: DC | PRN

## 2020-07-26 MED ORDER — SODIUM CHLORIDE 0.9 % IV SOLN: INTRAVENOUS | Status: DC | PRN

## 2020-07-26 MED ORDER — EPINEPHRINE 0.3 MG/0.3ML IJ SOAJ: 0.3000 mg | Freq: Once | INTRAMUSCULAR | Status: DC | PRN

## 2020-07-26 MED ORDER — METHYLPREDNISOLONE SODIUM SUCC 125 MG IJ SOLR: 125.0000 mg | Freq: Once | INTRAMUSCULAR | Status: DC | PRN

## 2020-07-26 MED ORDER — DIPHENHYDRAMINE HCL 50 MG/ML IJ SOLN: 50.0000 mg | Freq: Once | INTRAMUSCULAR | Status: DC | PRN

## 2020-07-26 NOTE — Progress Notes (Signed)
Diagnosis: COVID-19  Physician: Dr. Patrick Wright  Procedure: Covid Infusion Clinic Med: Sotrovimab infusion - Provided patient with sotrovimab fact sheet for patients, parents, and caregivers prior to infusion.   Complications: No immediate complications noted  Discharge: Discharged home    

## 2020-07-26 NOTE — Discharge Instructions (Signed)

## 2020-07-26 NOTE — Progress Notes (Signed)
Patient reviewed Fact Sheet for Patients, Parents, and Caregivers for Emergency Use Authorization (EUA) of Sotrovimab for the Treatment of Coronavirus. Patient also reviewed and is agreeable to the estimated cost of treatment. Patient is agreeable to proceed.   

## 2020-07-31 ENCOUNTER — Encounter (INDEPENDENT_AMBULATORY_CARE_PROVIDER_SITE_OTHER): Payer: 59 | Admitting: Ophthalmology

## 2020-08-13 ENCOUNTER — Ambulatory Visit: Payer: 59 | Admitting: Dermatology

## 2020-08-13 ENCOUNTER — Other Ambulatory Visit: Payer: Self-pay

## 2020-08-13 DIAGNOSIS — L570 Actinic keratosis: Secondary | ICD-10-CM

## 2020-08-13 DIAGNOSIS — L820 Inflamed seborrheic keratosis: Secondary | ICD-10-CM | POA: Diagnosis not present

## 2020-08-13 DIAGNOSIS — L814 Other melanin hyperpigmentation: Secondary | ICD-10-CM

## 2020-08-13 DIAGNOSIS — L720 Epidermal cyst: Secondary | ICD-10-CM

## 2020-08-13 DIAGNOSIS — L821 Other seborrheic keratosis: Secondary | ICD-10-CM

## 2020-08-13 DIAGNOSIS — L304 Erythema intertrigo: Secondary | ICD-10-CM

## 2020-08-13 DIAGNOSIS — Z1283 Encounter for screening for malignant neoplasm of skin: Secondary | ICD-10-CM

## 2020-08-13 DIAGNOSIS — D1800 Hemangioma unspecified site: Secondary | ICD-10-CM

## 2020-08-13 DIAGNOSIS — D229 Melanocytic nevi, unspecified: Secondary | ICD-10-CM

## 2020-08-13 DIAGNOSIS — L578 Other skin changes due to chronic exposure to nonionizing radiation: Secondary | ICD-10-CM

## 2020-08-13 NOTE — Progress Notes (Signed)
Follow-Up Visit   Subjective  Jorge Peterson is a 59 y.o. male who presents for the following: Skin Problem.  Pt c/o irritated spots on the L hand, R scapular, L side, R neck post auricular growing and itching, he would like this areas removed today. Check a rash beneath the R axillary pt treating with Hydrocortisone cream with a fair response. The patient presents for Upper Body Skin Exam (UBSE) for skin cancer screening and mole check.  The following portions of the chart were reviewed this encounter and updated as appropriate:   Tobacco  Allergies  Meds  Problems  Med Hx  Surg Hx  Fam Hx     Review of Systems:  No other skin or systemic complaints except as noted in HPI or Assessment and Plan.  Objective  Well appearing patient in no apparent distress; mood and affect are within normal limits.  All skin waist up examined.  Objective  Right axillary: Erythema with scale   Objective  Left dorsum hand, R scapular, L side, R neck post auricular, (9): Erythematous keratotic or waxy stuck-on papule or plaque.   Objective  Left scapular: 1.0 cm Subcutaneous nodule.   Objective  L cheek, dorsum nasal bridge (2): Erythematous thin papules/macules with gritty scale.    Assessment & Plan  Erythema intertrigo Right axillary  Secondary to possible deodorant or friction, may continue Hydrocortisone if no better return to the office   Inflamed seborrheic keratosis (9) Left dorsum hand, R scapular, L side, R neck post auricular,  Destruction of lesion - Left dorsum hand, R scapular, L side, R neck post auricular, Complexity: simple   Destruction method: cryotherapy   Informed consent: discussed and consent obtained   Timeout:  patient name, date of birth, surgical site, and procedure verified Lesion destroyed using liquid nitrogen: Yes   Region frozen until ice ball extended beyond lesion: Yes   Outcome: patient tolerated procedure well with no complications    Post-procedure details: wound care instructions given    Epidermal inclusion cyst Left scapular  Benign-appearing.  Observation.  Call clinic for new or changing moles.  Recommend daily use of broad spectrum spf 30+ sunscreen to sun-exposed areas.    May consider surgery removal if bothersome  AK (actinic keratosis) (2) L cheek, dorsum nasal bridge  Destruction of lesion - L cheek, dorsum nasal bridge Complexity: simple   Destruction method: cryotherapy   Informed consent: discussed and consent obtained   Timeout:  patient name, date of birth, surgical site, and procedure verified Lesion destroyed using liquid nitrogen: Yes   Region frozen until ice ball extended beyond lesion: Yes   Outcome: patient tolerated procedure well with no complications   Post-procedure details: wound care instructions given    Skin cancer screening   Lentigines - Scattered tan macules - Discussed due to sun exposure - Benign, observe - Call for any changes  Seborrheic Keratoses - Stuck-on, waxy, tan-brown papules and plaques  - Discussed benign etiology and prognosis. - Observe - Call for any changes  Melanocytic Nevi - Tan-brown and/or pink-flesh-colored symmetric macules and papules - Benign appearing on exam today - Observation - Call clinic for new or changing moles - Recommend daily use of broad spectrum spf 30+ sunscreen to sun-exposed areas.   Hemangiomas - Red papules - Discussed benign nature - Observe - Call for any changes  Actinic Damage - Chronic, secondary to cumulative UV/sun exposure - diffuse scaly erythematous macules with underlying dyspigmentation - Recommend daily broad  spectrum sunscreen SPF 30+ to sun-exposed areas, reapply every 2 hours as needed.  - Call for new or changing lesions.  Skin cancer screening performed today.  Return in about 1 year (around 08/13/2021).  IMarye Round, CMA, am acting as scribe for Sarina Ser, MD .  Documentation: I  have reviewed the above documentation for accuracy and completeness, and I agree with the above.  Sarina Ser, MD

## 2020-08-13 NOTE — Patient Instructions (Signed)
Cryotherapy Aftercare   Wash gently with soap and water everyday.    Apply Vaseline and Band-Aid daily until healed.

## 2020-08-19 ENCOUNTER — Encounter: Payer: Self-pay | Admitting: Dermatology

## 2020-08-22 ENCOUNTER — Other Ambulatory Visit: Payer: Self-pay | Admitting: Internal Medicine

## 2020-08-22 DIAGNOSIS — I480 Paroxysmal atrial fibrillation: Secondary | ICD-10-CM

## 2020-09-01 ENCOUNTER — Encounter (INDEPENDENT_AMBULATORY_CARE_PROVIDER_SITE_OTHER): Payer: 59 | Admitting: Ophthalmology

## 2020-09-05 ENCOUNTER — Other Ambulatory Visit: Payer: Self-pay | Admitting: Internal Medicine

## 2020-09-05 NOTE — Telephone Encounter (Signed)
Eliquis 5mg  refill request received. Patient is 60 years old, weight-102.1kg, Crea-1.1 on 04/30/2020 via [place] from [place], [place], and last seen by Dr. Rayann Heman on 01/21/20. Dose is appropriate based on dosing criteria. Will send in refill to requested pharmacy.

## 2020-09-15 ENCOUNTER — Encounter (INDEPENDENT_AMBULATORY_CARE_PROVIDER_SITE_OTHER): Payer: 59 | Admitting: Ophthalmology

## 2020-09-19 ENCOUNTER — Other Ambulatory Visit: Payer: Self-pay | Admitting: Internal Medicine

## 2020-09-19 NOTE — Progress Notes (Signed)
[facility] Note  09/22/2020     CHIEF COMPLAINT Patient presents for Retina Follow Up   HISTORY OF PRESENT ILLNESS: Jorge Peterson is a 60 y.o. male who presents to the clinic today for:  HPI    Retina Follow Up    Patient presents with  Other.  In right eye.  This started years ago.  Severity is severe.  Duration of 8 months.  Since onset it is stable.  I, the attending physician,  performed the HPI with the patient and updated documentation appropriately.          Comments    60 y/o male pt here for 8 mo f/u for Coat's Disease OD.  No change in [place] OU.  Denies pain, FOL, floaters.  Combigan QD OD.  BS 130 this a.m.  A1C 7.1.       Last edited by Bernarda Caffey, MD on 09/22/2020 10:07 AM. (History)      Referring physician: Anell Barr, OD Peterson,  Jorge [id number]  HISTORICAL INFORMATION:   Selected notes from the MEDICAL RECORD NUMBER Referred by Dr. Ellin Mayhew for retinal vasculitis OD   CURRENT MEDICATIONS: Current Outpatient Medications (Ophthalmic Drugs)  Medication Sig  . brimonidine-timolol (COMBIGAN) 0.2-0.5 % ophthalmic solution Place 1 drop into the right eye every 12 (twelve) hours.  . brimonidine-timolol (COMBIGAN) 0.2-0.5 % ophthalmic solution Combigan 0.2 %-0.5 % eye drops (Patient not taking: Reported on 09/22/2020)   No current facility-administered medications for this visit. (Ophthalmic Drugs)   Current Outpatient Medications (Other)  Medication Sig  . acetaminophen (TYLENOL) 500 MG tablet Take 2 tablets (1,000 mg total) by mouth every 6 (six) hours as needed.  [name] albuterol (VENTOLIN HFA) 108 (90 Base) MCG/ACT inhaler INHALE 2 PUFFS BY MOUTH EVERY 6 HOURS AS NEEDED FOR WHEEZE  . benzonatate (TESSALON) 200 MG capsule Take by mouth.  . Biotin 5000 MCG TABS Take 800 mcg by mouth daily.   . ciprofloxacin (CIPRO) 500 MG tablet Take 1 tablet (500 mg total) by mouth 2 (two) times daily.  . Coenzyme Q10 (COQ10 PO)  Take 120 mg by mouth every evening.  [name] ELIQUIS 5 MG TABS tablet TAKE 1 TABLET BY MOUTH TWICE A DAY  . fluticasone (FLONASE) 50 MCG/ACT nasal spray Place 1 spray into the nose daily as needed for allergies.   . folic acid (FOLVITE) A999333 MCG tablet Take 400 mcg by mouth daily.  . furosemide (LASIX) 40 MG tablet Take 1 tablet (40 mg total) by mouth daily. Take for 3 days  . hydrochlorothiazide (HYDRODIURIL) 12.5 MG tablet Take 12.5 mg by mouth daily.  . influenza vac split quadrivalent PF (FLUZONE QUADRIVALENT) 0.5 ML injection Fluzone Quad 2020-2021 (PF) 60 mcg (15 mcg x 4)/0.5 mL IM syringe  PHARMACY ADMINISTERED  . losartan (COZAAR) 100 MG tablet Take 100 mg by mouth every evening.   . metFORMIN (GLUCOPHAGE-XR) 750 MG 24 hr tablet Take 1,000 mg by mouth daily.  . methylPREDNISolone (MEDROL DOSEPAK) 4 MG TBPK tablet Take by mouth.  . Multiple Vitamin (MULTIVITAMIN WITH MINERALS) TABS tablet Take 1 tablet by mouth daily. Centrum Silver  . Omega-3 Fatty Acids (FISH OIL PO) Take 1 capsule by mouth 2 (two) times daily.  [name] omeprazole (PRILOSEC) 40 MG capsule Take 1 capsule (40 mg total) by mouth daily.  . predniSONE (DELTASONE) 5 MG tablet Take 5 mg by mouth daily.  . simvastatin (ZOCOR) 20 MG tablet TAKE 1 TABLET  BY MOUTH EVERYDAY AT BEDTIME  . budesonide-formoterol (SYMBICORT) 160-4.5 MCG/ACT inhaler Inhale into the lungs.   No current facility-administered medications for this visit. (Other)      REVIEW OF SYSTEMS: ROS    Positive for: Gastrointestinal, Endocrine, Cardiovascular, Eyes   Negative for: Constitutional, Neurological, Skin, Genitourinary, Musculoskeletal, HENT, Psychiatric, Allergic/Imm, Heme/Lymph   Last edited by Matthew Folks, COA on 09/22/2020  9:47 AM. (History)       ALLERGIES Allergies  Allergen Reactions  . Codeine Itching and Rash  . Latex Itching and Rash  . Oxycodone-Acetaminophen Itching and Rash    PAST MEDICAL HISTORY Past Medical History:  Diagnosis  Date  . Atrial fibrillation ([place])    s/p afib ablation 7/12 and 11/20  . Bradycardia    asymptomatic  . Cataract    Mixed OU  . Colonic diverticular abscess   . Diabetes ([place])   . Diverticulitis   . Elevated LFTs   . Fall   . Family history of anesthesia complication    father gets pneumonia after "  . GERD (gastroesophageal reflux disease)   . Hyperlipidemia   . Hyperplastic colon polyp   . Hypertension   . Hypertensive retinopathy    OU  . Internal hemorrhoids   . Tinnitus   . Tubular adenoma of colon   . Wears glasses    Past Surgical History:  Procedure Laterality Date  . ATRIAL ABLATION SURGERY  03/24/11   PVI by Greggory Brandy  . ATRIAL FIBRILLATION ABLATION N/A 07/24/2019   Procedure: ATRIAL FIBRILLATION ABLATION;  Surgeon: [name], MD;  Location: [facility];  Service: Cardiovascular;  Laterality: N/A;  . BACK SURGERY    . BRONCHOSCOPY  01/23/2013    biopsy  . CHOLECYSTECTOMY  05/02/2014   LAP CHOLE  . CHOLECYSTECTOMY N/A 05/02/2014   Procedure: LAPAROSCOPIC CHOLECYSTECTOMY WITH INTRAOPERATIVE CHOLANGIOGRAM;  Surgeon: Erroll Luna, MD;  Location: [place];  Service: General;  Laterality: N/A;  . EP IMPLANTABLE DEVICE N/A 06/27/2015   Procedure: Loop Recorder Insertion;  Surgeon: [name], MD;  Location: [place] [facility];  Service: Cardiovascular;  Laterality: N/A;  . ERCP N/A 05/25/2014   Procedure: ENDOSCOPIC RETROGRADE CHOLANGIOPANCREATOGRAPHY (ERCP);  Surgeon: [name], MD;  Location: [place];  Service: Endoscopy;  Laterality: N/A;  . Fistula surgery    . LOOP RECORDER REMOVAL N/A 07/24/2019   Procedure: LOOP RECORDER REMOVAL;  Surgeon: [name], MD;  Location: [place] [facility];  Service: Cardiovascular;  Laterality: N/A;  . MEDIASTINOSCOPY N/A 01/23/2013   Procedure: MEDIASTINOSCOPY;  Surgeon: Ivin Poot, MD;  Location: [place];  Service: Thoracic;  Laterality: N/A;  . QUADRICEPS TENDON REPAIR Right 01/13/2016   Procedure: REPAIR  QUADRICEP TENDON;  Surgeon: Latanya Maudlin, MD;  Location: [facility];  Service: Orthopedics;  Laterality: Right;  Received femoral nerve block in holding  . SHOULDER SURGERY     x 2  . TONSILLECTOMY    . VIDEO BRONCHOSCOPY WITH ENDOBRONCHIAL ULTRASOUND N/A 01/23/2013   Procedure: VIDEO BRONCHOSCOPY WITH ENDOBRONCHIAL ULTRASOUND;  Surgeon: Ivin Poot, MD;  Location: [facility];  Service: Thoracic;  Laterality: N/A;    FAMILY HISTORY Family History  Problem Relation Age of Onset  . Liver disease Mother        fatty liver  . Diabetes Father   . Heart disease Father        CABG at 8  . Colon cancer Neg Hx   . Stomach cancer Neg Hx   .  Pancreatic cancer Neg Hx   . Esophageal cancer Neg Hx     SOCIAL HISTORY Social History   Tobacco Use  . Smoking status: Never Smoker  . Smokeless tobacco: Never Used  Vaping Use  . Vaping Use: Never used  Substance Use Topics  . Alcohol use: No  . Drug use: No         OPHTHALMIC EXAM:  Base Eye Exam    Visual Acuity (Snellen - Linear)      Right Left   Dist cc CF @ 1' temporally 20/20 -   Dist ph cc NI    Correction: Glasses       Tonometry (Tonopen, 9:50 AM)      Right Left   Pressure 12 11       Pupils      Dark Light Shape React APD   Right 3 5 Round Brisk +3   Left 3 2 Round Brisk None       Visual Fields (Counting fingers)      Left Right    Full    Restrictions  Total inferior temporal, superior nasal, inferior nasal deficiencies       Extraocular Movement      Right Left    Full, Ortho Full, Ortho       Neuro/Psych    Oriented x3: Yes   Mood/Affect: Normal       Dilation    Both eyes: 1.0% Mydriacyl, 2.5% Phenylephrine @ 9:50 AM        Slit Lamp and Fundus Exam    Slit Lamp Exam      Right Left   Lids/Lashes Dermatochalasis - upper lid, mild Meibomian gland dysfunction Dermatochalasis - upper lid, mild Meibomian gland dysfunction   Conjunctiva/Sclera White and quiet White and quiet   Cornea Trace  Punctate epithelial erosions Clear   Anterior Chamber Deep and quiet, no cell or flare Deep and quiet   Iris Round and dilated, No NVI Round and dilated, No NVI   Lens 2+ Nuclear sclerosis, 2+ Cortical cataract 2+ Nuclear sclerosis, 2+ Cortical cataract   Vitreous Mild Vitreous syneresis, mild cell/pigment, Posterior vitreous detachment, vitreous condensations Mild Vitreous syneresis       Fundus Exam      Right Left   Disc 3+ Pallor, mild fibrosis, +PPA, +cupping Pink and Sharp   C/D Ratio 0.8 0.3   Macula Blunted foveal reflex, large central pigment clump, RPE mottling and clumping, +edema, scattered exudate, +IRH inferiorly, pre-retinal fibrosis temporal macula, scattered sub-retinal fibrosis Flat, Good foveal reflex, No heme or edema, RPE mottling   Vessels attenuation, +sheathing, Tortuous attenuation, mild Tortuousity   Periphery Attached, scattered pigment clumping, sclerotic vessels with sheathing, focal cluster of DBH at 0315 with surrounding circinate exudate - slightly improved, pigmented fibrotic scarring superiorly, +edema, good 360 PRP changes except for a few focal areas of edema temporally and nasally; Mild interval improvement in [facility] Attached; no heme or exudate            IMAGING AND PROCEDURES  Imaging and Procedures for @TODAY @  OCT, Retina - OU - Both Eyes       Right Eye Quality was good. Central Foveal Thickness: 234. Progression has improved. Findings include abnormal foveal contour, epiretinal membrane, preretinal fibrosis, outer retinal atrophy, subretinal hyper-reflective material, no SRF, intraretinal fluid, intraretinal hyper-reflective material (Interval improvement in peripheral IRF/IRHM and edema).   Left Eye Quality was good. Central Foveal Thickness: 291. Progression has been stable. Findings include normal  foveal contour, no IRF, no SRF, vitreomacular adhesion .   Notes *Images captured and stored on drive  Diagnosis / Impression:  OD: diffuse  atrophy and peripheral IRF 2/2 Coats Disease -- Interval improvement in peripheral IRF/IRHM and edema OS: NFP, no IRF/SRF  Clinical management:  See below  Abbreviations: NFP - Normal foveal profile. CME - cystoid macular edema. PED - pigment epithelial detachment. IRF - intraretinal fluid. SRF - subretinal fluid. EZ - ellipsoid zone. ERM - epiretinal membrane. ORA - outer retinal atrophy. ORT - outer retinal tubulation. SRHM - subretinal hyper-reflective material        Color Fundus Photography Optos - OU - Both Eyes       Right Eye Progression has improved. Disc findings include pallor. Macula : retinal pigment epithelium abnormalities, exudates. Vessels : attenuated (Coats disease with sheathing and perivascular exudation). Periphery : RPE abnormality, hemorrhage.   Left Eye Progression has been stable. Disc findings include normal observations. Macula : normal observations. Vessels : normal observations. Periphery : normal observations.   Notes **Images stored on drive**  Impression: OD: Coats disease with good 360 PRP, interval improvement in exudative disease OS: normal study, stable *Comparison made to color images from May 2021 FA*                  ASSESSMENT/PLAN:    ICD-10-CM   1. Coats' disease of right eye  H35.021 Color Fundus Photography Optos - OU - Both Eyes  2. Retinal edema  H35.81 OCT, Retina - OU - Both Eyes  3. Proliferative retinopathy, non-diabetic, right  H35.21   4. Retinal neovascularization of right eye  H35.051   5. Iris neovascularization, right  H21.1X1   6. Neovascular glaucoma of right eye, severe stage  H40.51X3   7. Essential hypertension  I10   8. Hypertensive retinopathy of both eyes  H35.033   9. Diabetes mellitus type 2 without retinopathy ([place])  E11.9   10. Combined forms of age-related cataract of both eyes  H25.813    1,2. Coats Disease OD  - Pt reports longstanding low vision and "inflammation" which resulted in low  vision since early childhood  - mother reports early diagnosis of Coats Disease in pt  - denies eye pain  - exam shows extensive vascular sheathing and exudative disease -- improved from prior  - FA 1.24.20 shows severe peripheral vascular nonperfusion and leakage at border of perfusion/nonperfusion  - s/p PRP OD (01.20.21)  - FA 3.24.21 shows interval improvement in vascular leakage post PRP laser  - f/u 9-12 months, DFE, OCT, optos color/FAF  3-6. Proliferative retinopathy w/ retinal and iris neovascularization and Neovascular Glaucoma OD  - secondary to long standing retinal vasculitis / Coats disease  - IOP improved to 12 today -- on Combigan QDaily per Dr. Ellin Mayhew  - s/p PRP OD as above  - exam shows regression of NVI and retinal NV  - discussed findings  - f/u in 9-12 months  7,8. Hypertensive retinopathy OU  - discussed importance of tight BP control  - monitor  9. Diabetes mellitus, type 2 without retinopathy  - The incidence, risk factors for progression, natural history and treatment options for diabetic retinopathy  were discussed with patient.    - The need for close monitoring of blood glucose, blood pressure, and serum lipids, avoiding cigarette or any type of tobacco, and the need for long term follow up was also discussed with patient.  - monitor  10. Mixed form age related cataract  OU  - The symptoms of cataract, surgical options, and treatments and risks were discussed with patient.  - discussed diagnosis and progression  - not yet visually significant  - monitor for now  Ophthalmic Meds Ordered this visit:  No orders of the defined types were placed in this encounter.     Return for f/u 9-12 months, coats dieases OD, DFE, OCT.  There are no Patient Instructions on file for this visit.  Explained the diagnoses, plan, and follow up with the patient and they expressed understanding.  Patient expressed understanding of the importance of proper follow up care.    This document serves as a record of services personally performed by Gardiner Sleeper, MD, PhD. It was created on their behalf by Estill Bakes, COT an ophthalmic technician. The creation of this record is the provider's dictation and/or activities during the visit.    Electronically signed by: Estill Bakes, COT 1.21.22 @ 12:55 PM   This document serves as a record of services personally performed by Gardiner Sleeper, MD, PhD. It was created on their behalf by San Jetty. Owens Shark, OA an ophthalmic technician. The creation of this record is the provider's dictation and/or activities during the visit.    Electronically signed by: San Jetty. Marguerita Merles 01.24.2022 12:55 PM  Gardiner Sleeper, M.D., Ph.D. Diseases & Surgery of the Retina and [place] 09/22/2020   I have reviewed the above documentation for accuracy and completeness, and I agree with the above. Gardiner Sleeper, M.D., Ph.D. 09/22/20 12:55 PM   Abbreviations: M myopia (nearsighted); A astigmatism; H hyperopia (farsighted); P presbyopia; Mrx spectacle prescription;  CTL contact lenses; OD right eye; OS left eye; OU both eyes  XT exotropia; ET esotropia; PEK punctate epithelial keratitis; PEE punctate epithelial erosions; DES dry eye syndrome; MGD meibomian gland dysfunction; ATs artificial tears; PFAT's preservative free artificial tears; [place] nuclear sclerotic cataract; PSC posterior subcapsular cataract; ERM epi-retinal membrane; PVD posterior vitreous detachment; RD retinal detachment; DM diabetes mellitus; DR diabetic retinopathy; NPDR non-proliferative diabetic retinopathy; PDR proliferative diabetic retinopathy; CSME clinically significant macular edema; DME diabetic macular edema; dbh dot blot hemorrhages; CWS cotton wool spot; POAG primary open angle glaucoma; C/D cup-to-disc ratio; HVF humphrey visual field; GVF goldmann visual field; OCT optical coherence tomography; IOP intraocular pressure; BRVO  Branch retinal vein occlusion; CRVO central retinal vein occlusion; CRAO central retinal artery occlusion; BRAO branch retinal artery occlusion; RT retinal tear; SB scleral buckle; PPV pars plana vitrectomy; VH Vitreous hemorrhage; PRP panretinal laser photocoagulation; IVK intravitreal kenalog; VMT vitreomacular traction; MH Macular hole;  NVD neovascularization of the disc; NVE neovascularization elsewhere; AREDS age related eye disease study; ARMD age related macular degeneration; POAG primary open angle glaucoma; EBMD epithelial/anterior basement membrane dystrophy; ACIOL anterior chamber intraocular lens; IOL intraocular lens; PCIOL posterior chamber intraocular lens; Phaco/IOL phacoemulsification with intraocular lens placement; [place] photorefractive keratectomy; LASIK laser assisted in situ keratomileusis; HTN hypertension; DM diabetes mellitus; COPD chronic obstructive pulmonary disease

## 2020-09-22 ENCOUNTER — Ambulatory Visit (INDEPENDENT_AMBULATORY_CARE_PROVIDER_SITE_OTHER): Payer: 59 | Admitting: Ophthalmology

## 2020-09-22 ENCOUNTER — Encounter (INDEPENDENT_AMBULATORY_CARE_PROVIDER_SITE_OTHER): Payer: Self-pay | Admitting: Ophthalmology

## 2020-09-22 ENCOUNTER — Other Ambulatory Visit: Payer: Self-pay

## 2020-09-22 DIAGNOSIS — H35021 Exudative retinopathy, right eye: Secondary | ICD-10-CM

## 2020-09-22 DIAGNOSIS — I10 Essential (primary) hypertension: Secondary | ICD-10-CM

## 2020-09-22 DIAGNOSIS — H35051 Retinal neovascularization, unspecified, right eye: Secondary | ICD-10-CM | POA: Diagnosis not present

## 2020-09-22 DIAGNOSIS — H4051X3 Glaucoma secondary to other eye disorders, right eye, severe stage: Secondary | ICD-10-CM

## 2020-09-22 DIAGNOSIS — H25813 Combined forms of age-related cataract, bilateral: Secondary | ICD-10-CM

## 2020-09-22 DIAGNOSIS — H3521 Other non-diabetic proliferative retinopathy, right eye: Secondary | ICD-10-CM

## 2020-09-22 DIAGNOSIS — H3581 Retinal edema: Secondary | ICD-10-CM

## 2020-09-22 DIAGNOSIS — H211X1 Other vascular disorders of iris and ciliary body, right eye: Secondary | ICD-10-CM

## 2020-09-22 DIAGNOSIS — H35033 Hypertensive retinopathy, bilateral: Secondary | ICD-10-CM

## 2020-09-22 DIAGNOSIS — E119 Type 2 diabetes mellitus without complications: Secondary | ICD-10-CM

## 2020-10-30 ENCOUNTER — Encounter: Payer: Self-pay | Admitting: Dermatology

## 2020-10-30 ENCOUNTER — Other Ambulatory Visit: Payer: Self-pay

## 2020-10-30 ENCOUNTER — Ambulatory Visit: Payer: 59 | Admitting: Dermatology

## 2020-10-30 DIAGNOSIS — L820 Inflamed seborrheic keratosis: Secondary | ICD-10-CM | POA: Diagnosis not present

## 2020-10-30 DIAGNOSIS — D229 Melanocytic nevi, unspecified: Secondary | ICD-10-CM

## 2020-10-30 DIAGNOSIS — D2239 Melanocytic nevi of other parts of face: Secondary | ICD-10-CM

## 2020-10-30 DIAGNOSIS — L578 Other skin changes due to chronic exposure to nonionizing radiation: Secondary | ICD-10-CM

## 2020-10-30 NOTE — Progress Notes (Signed)
   Follow-Up Visit   Subjective  Jorge Peterson is a 60 y.o. male who presents for the following: check spots (Back x 2, ) and recheck Ak (L cheek, LN2 on 08/13/20).  The following portions of the chart were reviewed this encounter and updated as appropriate:   Tobacco  Allergies  Meds  Problems  Med Hx  Surg Hx  Fam Hx     Review of Systems:  No other skin or systemic complaints except as noted in HPI or Assessment and Plan.  Objective  Well appearing patient in no apparent distress; mood and affect are within normal limits.  A focused examination was performed including face, back. Relevant physical exam findings are noted in the Assessment and Plan.  Objective  Left side x 1, R mid back x 1, L neck x 1 (3): Erythematous keratotic or waxy stuck-on papule or plaque.   Objective  L ant chin: 0.6cm flesh colored pap with central brown macule  Images     Assessment & Plan  Inflamed seborrheic keratosis (3) Left side x 1, R mid back x 1, L neck x 1  Residual L side, R mid back  Destruction of lesion - Left side x 1, R mid back x 1, L neck x 1 Complexity: simple   Destruction method: cryotherapy   Informed consent: discussed and consent obtained   Timeout:  patient name, date of birth, surgical site, and procedure verified Lesion destroyed using liquid nitrogen: Yes   Region frozen until ice ball extended beyond lesion: Yes   Outcome: patient tolerated procedure well with no complications   Post-procedure details: wound care instructions given    Nevus L ant chin Benign appearing, observe Discussed shave removal of irritating and traumatized by shaving.  He may consider in the future.  Actinic Damage - chronic, secondary to cumulative UV radiation exposure/sun exposure over time - diffuse scaly erythematous macules with underlying dyspigmentation - Recommend daily broad spectrum sunscreen SPF 30+ to sun-exposed areas, reapply every 2 hours as needed.  -  Recommend staying in the shade or wearing long sleeves, sun glasses (UVA+UVB protection) and wide brim hats (4-inch brim around the entire circumference of the hat). - Call for new or changing lesions.  Return for as scheduled for [facility]SE.   I, Othelia Pulling, RMA, am acting as scribe for Sarina Ser, MD .  Documentation: I have reviewed the above documentation for accuracy and completeness, and I agree with the above.  Sarina Ser, MD

## 2020-12-16 ENCOUNTER — Other Ambulatory Visit: Payer: Self-pay | Admitting: Internal Medicine

## 2021-03-10 ENCOUNTER — Other Ambulatory Visit: Payer: Self-pay | Admitting: Internal Medicine

## 2021-03-10 NOTE — Telephone Encounter (Signed)
Prescription refill request for Eliquis received. Indication: PAF Last office visit: 01/21/20  Allred MD Scr: 1.1 on 04/30/20 Age: 60 Weight: 102.1kg  Based on above findings Eliquis 5mg  twice daily is the appropriate dose.  Refill approved.  Pt is due labs.  Has appt with Dr Rayann Heman 11/22. Order for CBC/BMP placed in appt note.

## 2021-04-04 ENCOUNTER — Other Ambulatory Visit: Payer: Self-pay | Admitting: Internal Medicine

## 2021-04-08 ENCOUNTER — Telehealth: Payer: Self-pay | Admitting: Internal Medicine

## 2021-04-08 NOTE — Telephone Encounter (Signed)
Jorge Peterson is a 60 year old male well-known to me He sent me an email through his wife's, she is also my patient, MyChart portal because he stated that I was not an option for email within his MyChart. I also saw him today when his wife came for her endoscopic procedures.  Symptoms sound suspicious for IBS and SIBO Please treat with rifaximin 550 mg 3 times daily x14 days --- IBS  He should let me know if symptoms fail to improve  JMP  Dr Hilarie Fredrickson,   I hope you and your family are doing well. This email is to share what has been going on with me for the past three months and has gradually gotten worse. I began having bouts of loose stools, terrible smelling gas, and a small degree of bloating. However, in the past month it has gotten progressively worse--to the point of interrupting my life and schedule. I do admit that I have not been taking Metamucil daily, so I am going to begin doing that. I also take Metformin in the morning and evenings for diabetes. When these bouts occur now, they can last for a day or two and honestly, they are somewhat uncontrollable. I do not have pain or nausea that accompany these other things.   Please advise me on what to do. I will see you tomorrow when you do [facility]'s colonoscopy/endoscopy.   Thank you!   [place]

## 2021-04-09 MED ORDER — RIFAXIMIN 550 MG PO TABS: 550.0000 mg | ORAL_TABLET | Freq: Three times a day (TID) | ORAL | 0 refills | Status: DC

## 2021-04-09 NOTE — Addendum Note (Signed)
Addended by: Larina Bras on: 04/09/2021 09:26 AM   Modules accepted: Orders

## 2021-04-09 NOTE — Telephone Encounter (Signed)
Rx sent 

## 2021-04-10 NOTE — Telephone Encounter (Signed)
Dr Hilarie Fredrickson- Please see message below...  Of note, I have completed a PA request through covermymeds but do not think it will be approved based on the questions I was asked (has pt tried tricyclic medication). Do you want to try metronidazole?

## 2021-04-10 NOTE — Telephone Encounter (Signed)
Insurance does not want to cover med you called in Received: Yesterday [facility] Dr Hilarie Fredrickson, This email is from Francee Piccolo, not Rodena Piety in regard to the antibiotic you called in today for me after talking with you on Wednesday at Anita's colonoscopy. My insurance does not want to pay for this drug (cost is $2500.00) but wants me to choose a generic.   You mentioned that we have to go through another pharmacy that Rodena Piety used with one of your meds but we do not have the number and are not sure what the procedure is. Otherwise, we await your recommendation.   [facility] suggested for me to mention to you that it might go through if you would call my insurance.  I will leave that up to you.  The pharmacist said my insurance would prefer for me to use another medication.  If that is ok, you can just call in whatever other "generic" medication I would need.   FYI: It hit me again today at work and is causing much disruption for me.   I looked in My Chart to see if you emailed me, but I didn't see a message from you, so that's why I'm emailing you in [facility]'s.   Thank you for your attention in this matter.   Jorge [employer]

## 2021-04-13 ENCOUNTER — Other Ambulatory Visit (INDEPENDENT_AMBULATORY_CARE_PROVIDER_SITE_OTHER): Payer: Self-pay | Admitting: Ophthalmology

## 2021-04-13 NOTE — Telephone Encounter (Signed)
Rx was denied by insurance. 42 tablet course of Xifaxan has been placed at the front desk for patient pick up. Patient has been advised and verbalizes understanding.

## 2021-04-14 ENCOUNTER — Encounter (INDEPENDENT_AMBULATORY_CARE_PROVIDER_SITE_OTHER): Payer: Self-pay | Admitting: Ophthalmology

## 2021-04-21 NOTE — Telephone Encounter (Signed)
Combigan prescribed by Dr. Ellin Mayhew

## 2021-04-28 ENCOUNTER — Other Ambulatory Visit: Payer: Self-pay

## 2021-04-28 MED ORDER — APIXABAN 5 MG PO TABS: 5.0000 mg | ORAL_TABLET | Freq: Two times a day (BID) | ORAL | 3 refills | Status: DC

## 2021-04-28 MED ORDER — OMEPRAZOLE 40 MG PO CPDR: DELAYED_RELEASE_CAPSULE | ORAL | 3 refills | Status: DC

## 2021-04-28 NOTE — Telephone Encounter (Addendum)
Eliquis '5mg'$  refill request received. Patient is 60 years old, weight-102.1kg, Crea-1.1 on 04/30/2020 via [place] from [place], and last seen by Dr. Rayann Heman on 01/21/2020. Dose is appropriate based on dosing criteria. Will send in refill to requested pharmacy.    Also, spoke with pt and he is aware that he needs an appt with Dr. Rayann Heman; transferred to scheduling dept. Discussed labs ordering labs since they will be over a year old in 2 days. Pt states he has some other things to discuss with Dr. Rayann Heman as well.   Pt has an appt set for 06/10/21 with Dr. Rayann Heman

## 2021-04-28 NOTE — Progress Notes (Signed)
Rx for omeprazole sent to [facility]. Please see mychart message.

## 2021-05-07 ENCOUNTER — Ambulatory Visit: Payer: 59 | Admitting: Internal Medicine

## 2021-05-07 ENCOUNTER — Other Ambulatory Visit (INDEPENDENT_AMBULATORY_CARE_PROVIDER_SITE_OTHER): Payer: 59

## 2021-05-07 ENCOUNTER — Encounter: Payer: Self-pay | Admitting: Internal Medicine

## 2021-05-07 VITALS — BP 100/70 | HR 64 | Ht 67.0 in | Wt 215.1 lb

## 2021-05-07 DIAGNOSIS — R198 Other specified symptoms and signs involving the digestive system and abdomen: Secondary | ICD-10-CM | POA: Diagnosis not present

## 2021-05-07 DIAGNOSIS — R1030 Lower abdominal pain, unspecified: Secondary | ICD-10-CM | POA: Diagnosis not present

## 2021-05-07 DIAGNOSIS — Z8719 Personal history of other diseases of the digestive system: Secondary | ICD-10-CM

## 2021-05-07 DIAGNOSIS — K219 Gastro-esophageal reflux disease without esophagitis: Secondary | ICD-10-CM

## 2021-05-07 DIAGNOSIS — R195 Other fecal abnormalities: Secondary | ICD-10-CM

## 2021-05-07 LAB — BUN: BUN: 17 mg/dL (ref 6–23)

## 2021-05-07 LAB — CREATININE, SERUM: Creatinine, Ser: 1.07 mg/dL (ref 0.40–1.50)

## 2021-05-07 NOTE — Progress Notes (Signed)
Subjective:    Patient ID: Jorge Peterson, male    DOB: [birth year], 60 y.o.   MRN: [id number]  HPI Jorge Peterson is a 60 year old male with a history of complicated diverticulitis status post sigmoid resection in November 2019, history of adenomatous colon polyps, GERD who is seen in follow-up.  He is here alone today.  He also has a history of atrial fibrillation treated with prior ablation, chronic anticoagulation with Eliquis, hypertension, hyperlipidemia and diabetes.  I recently treated him with a course of rifaximin 550 mg 3 times daily x14 days.  This was for abdominal gassiness, abdominal bloating, loose frequent stools, fecal urgency.  He completed the course and reports that symptoms are better but not yet resolved.  His gassiness as well as foul-smelling stools have improved.  He is not having the frequency or urgency like he was but he is still having at times loose stools and incomplete evacuation.  He notes that some days he will have formed stools while other days he will have 5-6 stools in a few hours.  Usually in the late morning to early afternoon.  No blood in stool or melena.  He has noticed some right lower abdominal pain which is not severe and is intermittent.  In the past 10 days he did resume Metamucil which I had previously recommended 2 teaspoons in the morning and he thinks this has been somewhat helpful.  Intentionally he has lost 15 pounds in the last several months through dietary change, portion control and avoiding sweets.  His sugars have been better controlled.  He has not had chest pain or dyspnea.  Heartburn is well controlled with omeprazole 40 mg daily.   Review of Systems As per HPI, otherwise negative  Current Medications, Allergies, Past Medical History, Past Surgical History, Family History and Social History were reviewed in [employer] record.    Objective:   Physical Exam BP 100/70   Pulse 64   Ht '5\' 7"'$  (1.702 m)   Wt 215  lb 2 oz (97.6 kg)   BMI 33.69 kg/m  Gen: awake, alert, NAD HEENT: anicteric  CV: RRR, no mrg Pulm: CTA b/l Abd: soft, mildly tender in the lower abdomen without rebound or guarding, ND, +BS throughout Ext: no c/c/e Neuro: nonfocal      Assessment & Plan:  60 year old male with a history of complicated diverticulitis status post sigmoid resection in November 2019, history of adenomatous colon polyps, GERD who is seen in follow-up.  Loose stools/lower abdominal pain/altered bowel function/history of diverticulitis status post sigmoid resection --he had a response though incomplete to 2 weeks of rifaximin therapy.  It was surmised that he could have had irritable bowel, bacterial overgrowth or micro biome disruption.  We discussed his symptoms today and I think it is possible that his metformin is causing his looser stools and possibly altered bowel function.  The dose of this medication was changed/increased within the past year though he has been on it for quite some time.  I recommended the following: -- CT scan abdomen pelvis with contrast --Continue Metamucil 2 teaspoons daily --I asked that he notify Dr. Kary Kos to see if he can stop metformin for 7 to 14 days to see if bowel habits normalize --If they do not I recommend he try a FODMAP diet --Follow-up with me if symptoms not improving and if all of the above unhelpful or unrevealing we may consider repeat colonoscopy  2.  History of adenomatous colon polyps --2  small adenomas at colonoscopy in 2019, he is on colonoscopy surveillance protocol; next recall would be November 2024  3.  GERD --well-controlled with omeprazole 40 mg daily.  He does not think that this medication is contributing to loose stools.  30 minutes total spent today including patient facing time, coordination of care, reviewing medical history/procedures/pertinent radiology studies, and documentation of the encounter.

## 2021-05-07 NOTE — Patient Instructions (Addendum)
Continue Metamucil 2 teaspoons daily.  Please hold your metformin x 2 weeks. Let's see if this helps your diarrhea. Remember to let your primary care physician know that you are doing this.              We have given you a "FODMAP" diet to follow AFTER your 2 week trial of metformin hold.  Please call/mychart our office to let us know how your diarrhea is doing after holding the metformin.  Your provider has requested that you go to the basement level for lab work before leaving today. Press "B" on the elevator. The lab is located at the first door on the left as you exit the elevator.  You have been scheduled for a CT scan of the abdomen and pelvis at  [place],  [place]  [id number]  You are scheduled on Friday 05/22/21 at 4:00 pm. You should arrive 15 minutes prior to your appointment time for registration. Please follow the written instructions below on the day of your exam:  WARNING: IF YOU ARE ALLERGIC TO IODINE/X-RAY DYE, PLEASE NOTIFY RADIOLOGY IMMEDIATELY AT [phone]! YOU WILL BE GIVEN A 13 HOUR PREMEDICATION PREP.  1) Do not eat or drink anything after 12:00 pm (4 hours prior to your test) 2) You should be given 2 bottles of oral contrast to drink (pick these up at the imaging location prior to your appointment). The solution may taste better if refrigerated, but do NOT add ice or any other liquid to this solution. Shake well before drinking.    Drink 1 bottle of contrast @ 2:00 pm (2 hours prior to your exam)  Drink 1 bottle of contrast @ 3:00 pm (1 hour prior to your exam)  You may take any medications as prescribed with a small amount of water, if necessary. If you take any of the following medications: METFORMIN, GLUCOPHAGE, GLUCOVANCE, AVANDAMET, RIOMET, FORTAMET, [place] MET, JANUMET, GLUMETZA or METAGLIP, you MAY be asked to HOLD this medication 48 hours AFTER the exam.  The purpose of you drinking the oral contrast is to aid in the  visualization of your intestinal tract. The contrast solution may cause some diarrhea. Depending on your individual set of symptoms, you may also receive an intravenous injection of x-ray contrast/dye. Plan on being at the [facility] location for 30 minutes or longer, depending on the type of exam you are having performed.  This test typically takes 30-45 minutes to complete.  If you have any questions regarding your exam or if you need to reschedule, you may call the CT department at [phone] between the hours of 8:00 am and 5:00 pm, Monday-Friday.  ________________________________________________________9/23/22 frid 4 pm Outpt imaging

## 2021-05-07 NOTE — Progress Notes (Deleted)
SNLouellen Molder Exp: 2022-07-16 [id number] Patient arrived for Capsule Endoscopy. Reported the prep went well. This nurse explained dietary restrictions for the next few hours. Patient verbalized understanding. Opened capsule, ensured capsule was flashing prior to the patient swallowing the capsule. Patient swallowed capsule without difficulty. Patient instructed to return to the office at 4:00 pm today for removal of the recording equipment, to call the office with any questions and if no capsule was visualized after 72 hours. No further questions by the conclusion of the visit.

## 2021-05-20 ENCOUNTER — Ambulatory Visit: Payer: 59 | Admitting: Physician Assistant

## 2021-05-21 ENCOUNTER — Ambulatory Visit: Payer: 59 | Admitting: Dermatology

## 2021-05-21 ENCOUNTER — Other Ambulatory Visit: Payer: Self-pay

## 2021-05-21 DIAGNOSIS — L98491 Non-pressure chronic ulcer of skin of other sites limited to breakdown of skin: Secondary | ICD-10-CM | POA: Diagnosis not present

## 2021-05-21 DIAGNOSIS — L820 Inflamed seborrheic keratosis: Secondary | ICD-10-CM

## 2021-05-21 DIAGNOSIS — L02212 Cutaneous abscess of back [any part, except buttock]: Secondary | ICD-10-CM

## 2021-05-21 DIAGNOSIS — L0291 Cutaneous abscess, unspecified: Secondary | ICD-10-CM

## 2021-05-21 DIAGNOSIS — L03312 Cellulitis of back [any part except buttock]: Secondary | ICD-10-CM | POA: Diagnosis not present

## 2021-05-21 MED ORDER — MUPIROCIN 2 % EX OINT: 1.0000 "application " | TOPICAL_OINTMENT | Freq: Every day | CUTANEOUS | 1 refills | Status: DC

## 2021-05-21 MED ORDER — DOXYCYCLINE MONOHYDRATE 100 MG PO CAPS: 100.0000 mg | ORAL_CAPSULE | Freq: Two times a day (BID) | ORAL | 0 refills | Status: AC

## 2021-05-21 NOTE — Patient Instructions (Addendum)
If you have any questions or concerns for your doctor, please call our main line at [phone] and press option 4 to reach your doctor's medical assistant. If no one answers, please leave a voicemail as directed and we will return your call as soon as possible. Messages left after 4 pm will be answered the following business day.   You may also send us a message via MyChart. We typically respond to MyChart messages within 1-2 business days.  For prescription refills, please ask your pharmacy to contact our office. Our fax number is [phone].  If you have an urgent issue when the clinic is closed that cannot wait until the next business day, you can page your doctor at the number below.    Please note that while we do our best to be available for urgent issues outside of office hours, we are not available 24/7.   If you have an urgent issue and are unable to reach us, you may choose to seek medical care at your doctor's office, retail clinic, urgent care center, or emergency room.  If you have a medical emergency, please immediately call 911 or go to the emergency department.  Pager Numbers  - Dr. Kowalski: [phone]  - Dr. Moye: [phone]  - Dr. Stewart: [phone]  In the event of inclement weather, please call our main line at [phone] for an update on the status of any delays or closures.  Dermatology Medication Tips: Please keep the boxes that topical medications come in in order to help keep track of the instructions about where and how to use these. Pharmacies typically print the medication instructions only on the boxes and not directly on the medication tubes.   If your medication is too expensive, please contact our office at [phone] option 4 or send us a message through MyChart.   We are unable to tell what your co-pay for medications will be in advance as this is different depending on your insurance coverage. However, we may be able to find a substitute  medication at lower cost or fill out paperwork to get insurance to cover a needed medication.   If a prior authorization is required to get your medication covered by your insurance company, please allow us 1-2 business days to complete this process.  Drug prices often vary depending on where the prescription is filled and some pharmacies may offer cheaper prices.  The website www.goodrx.com contains coupons for medications through different pharmacies. The prices here do not account for what the cost may be with help from insurance (it may be cheaper with your insurance), but the website can give you the price if you did not use any insurance.  - You can print the associated coupon and take it with your prescription to the pharmacy.  - You may also stop by our office during regular business hours and pick up a GoodRx coupon card.  - If you need your prescription sent electronically to a different pharmacy, notify our office through [facility] MyChart or by phone at [phone] option 4.        Pre-Operative Instructions  You are scheduled for a surgical procedure at [facility]. We recommend you read the following instructions. If you have any questions or concerns, please call the office at [phone].  Shower and wash the entire body with soap and water the day of your surgery paying special attention to cleansing at and around the planned surgery site.  Avoid aspirin or aspirin containing products   at least fourteen (14) days prior to your surgical procedure and for at least one week (7 Days) after your surgical procedure. If you take aspirin on a regular basis for heart disease or history of stroke or for any other reason, we may recommend you continue taking aspirin but please notify us if you take this on a regular basis. Aspirin can cause more bleeding to occur during surgery as well as prolonged bleeding and bruising after surgery.   Avoid other nonsteroidal pain  medications at least one week prior to surgery and at least one week prior to your surgery. These include medications such as Ibuprofen (Motrin, Advil and Nuprin), Naprosyn, Voltaren, Relafen, etc. If medications are used for therapeutic reasons, please inform us as they can cause increased bleeding or prolonged bleeding during and bruising after surgical procedures.   Please advise us if you are taking any "blood thinner" medications such as Coumadin or Dipyridamole or Plavix or similar medications. These cause increased bleeding and prolonged bleeding during procedures and bruising after surgical procedures. We may have to consider discontinuing these medications briefly prior to and shortly after your surgery if safe to do so.   Please inform us of all medications you are currently taking. All medications that are taken regularly should be taken the day of surgery as you always do. Nevertheless, we need to be informed of what medications you are taking prior to surgery to know whether they will affect the procedure or cause any complications.   Please inform us of any medication allergies. Also inform us of whether you have allergies to Latex or rubber products or whether you have had any adverse reaction to Lidocaine or Epinephrine.  Please inform us of any prosthetic or artificial body parts such as artificial heart valve, joint replacements, etc., or similar condition that might require preoperative antibiotics.   We recommend avoidance of alcohol at least two weeks prior to surgery and continued avoidance for at least two weeks after surgery.   We recommend discontinuation of tobacco smoking at least two weeks prior to surgery and continued abstinence for at least two weeks after surgery.  Do not plan strenuous exercise, strenuous work or strenuous lifting for approximately four weeks after your surgery.   We request if you are unable to make your scheduled surgical appointment, please call us  at least a week in advance or as soon as you are aware of a problem so that we can cancel or reschedule the appointment.   You MAY TAKE TYLENOL (acetaminophen) for pain as it is not a blood thinner.   PLEASE PLAN TO BE IN TOWN FOR TWO WEEKS FOLLOWING SURGERY, THIS IS IMPORTANT SO YOU CAN BE CHECKED FOR DRESSING CHANGES, SUTURE REMOVAL AND TO MONITOR FOR POSSIBLE COMPLICATIONS.  

## 2021-05-21 NOTE — Progress Notes (Signed)
   Follow-Up Visit   Subjective  Jorge Peterson is a 60 y.o. male who presents for the following: Cyst (Back, 33yr, recently inflamed ~1wk, tender, opened up last night and drained, using neosporin) and check spot (R shoulder, 23m, itchy prn).  The following portions of the chart were reviewed this encounter and updated as appropriate:   Tobacco  Allergies  Meds  Problems  Med Hx  Surg Hx  Fam Hx     Review of Systems:  No other skin or systemic complaints except as noted in HPI or Assessment and Plan.  Objective  Well appearing patient in no apparent distress; mood and affect are within normal limits.  A focused examination was performed including back. Relevant physical exam findings are noted in the Assessment and Plan.  R shoulder x 1, Total = 1 Erythematous keratotic or waxy stuck-on papule or plaque.   L mid lat back Draining red nodule   Assessment & Plan  Inflamed seborrheic keratosis R shoulder x 1, Total = 1  Destruction of lesion - R shoulder x 1, Total = 1 Complexity: simple   Destruction method: cryotherapy   Informed consent: discussed and consent obtained   Timeout:  patient name, date of birth, surgical site, and procedure verified Lesion destroyed using liquid nitrogen: Yes   Region frozen until ice ball extended beyond lesion: Yes   Outcome: patient tolerated procedure well with no complications   Post-procedure details: wound care instructions given    Abscess With cellulitis from ruptured cyst - ulcerated L mid lat back Mechanical Debridement and dressing today  Start Doxycycline 100mg  1 po bid x 7 days, take with food and drink Start Mupirocin oint qd with dressing changes  Recommend excising, will schedule.  Doxycycline should be taken with food to prevent nausea. Do not lay down for 30 minutes after taking. Be cautious with sun exposure and use good sun protection while on this medication. Pregnant women should not take this medication.     doxycycline (MONODOX) 100 MG capsule - L mid lat back Take 1 capsule (100 mg total) by mouth 2 (two) times daily for 7 days. Take with food and drink  mupirocin ointment (BACTROBAN) 2 % - L mid lat back Apply 1 application topically daily. Qd to wound on back until resolved  Return for To be scheduled for surgery cyst L mid lat back.  I, Othelia Pulling, RMA, am acting as scribe for Sarina Ser, MD . Documentation: I have reviewed the above documentation for accuracy and completeness, and I agree with the above.  Sarina Ser, MD

## 2021-05-22 ENCOUNTER — Other Ambulatory Visit: Payer: Self-pay

## 2021-05-22 ENCOUNTER — Ambulatory Visit
Admission: RE | Admit: 2021-05-22 | Discharge: 2021-05-22 | Disposition: A | Discharge status: Home / Self Care | Payer: 59 | Source: Ambulatory Visit | Attending: Internal Medicine | Admitting: Internal Medicine

## 2021-05-22 DIAGNOSIS — R1030 Lower abdominal pain, unspecified: Secondary | ICD-10-CM | POA: Insufficient documentation

## 2021-05-22 DIAGNOSIS — Z8719 Personal history of other diseases of the digestive system: Secondary | ICD-10-CM | POA: Diagnosis present

## 2021-05-22 MED ORDER — IOHEXOL 350 MG/ML SOLN
100.0000 mL | Freq: Once | INTRAVENOUS | Status: AC | PRN
  Administered 2021-05-22: 100 mL via INTRAVENOUS

## 2021-05-26 ENCOUNTER — Encounter: Payer: Self-pay | Admitting: Dermatology

## 2021-05-27 ENCOUNTER — Encounter: Payer: Self-pay | Admitting: Dermatology

## 2021-05-27 ENCOUNTER — Other Ambulatory Visit: Payer: Self-pay | Admitting: Internal Medicine

## 2021-05-27 DIAGNOSIS — K589 Irritable bowel syndrome without diarrhea: Secondary | ICD-10-CM

## 2021-05-27 DIAGNOSIS — R195 Other fecal abnormalities: Secondary | ICD-10-CM

## 2021-05-27 MED ORDER — DICYCLOMINE HCL 20 MG PO TABS: 20.0000 mg | ORAL_TABLET | Freq: Three times a day (TID) | ORAL | 1 refills | Status: DC | PRN

## 2021-06-10 ENCOUNTER — Other Ambulatory Visit: Payer: Self-pay

## 2021-06-10 ENCOUNTER — Ambulatory Visit: Payer: 59 | Admitting: Internal Medicine

## 2021-06-10 VITALS — BP 126/72 | HR 60 | Ht 67.0 in | Wt 215.6 lb

## 2021-06-10 DIAGNOSIS — E785 Hyperlipidemia, unspecified: Secondary | ICD-10-CM | POA: Diagnosis not present

## 2021-06-10 DIAGNOSIS — I480 Paroxysmal atrial fibrillation: Secondary | ICD-10-CM | POA: Diagnosis not present

## 2021-06-10 DIAGNOSIS — R001 Bradycardia, unspecified: Secondary | ICD-10-CM | POA: Diagnosis not present

## 2021-06-10 DIAGNOSIS — I10 Essential (primary) hypertension: Secondary | ICD-10-CM | POA: Diagnosis not present

## 2021-06-10 MED ORDER — ROSUVASTATIN CALCIUM 10 MG PO TABS: 10.0000 mg | ORAL_TABLET | Freq: Every day | ORAL | 3 refills | Status: DC

## 2021-06-10 NOTE — Progress Notes (Signed)
PCP: [name], MD   Primary EP: Dr Sherlene Shams is a 60 y.o. male who presents today for routine electrophysiology followup.  Since last being seen in our clinic, the patient reports doing very well.  Today, he denies symptoms of palpitations, chest pain, shortness of breath,  lower extremity edema, dizziness, presyncope, or syncope.  The patient is otherwise without complaint today.   Past Medical History:  Diagnosis Date   Atrial fibrillation [facility])    s/p afib ablation 7/12 and 11/20   Bradycardia    asymptomatic   Cataract    Mixed OU   Colonic diverticular abscess    Diabetes ([place])    Diverticulitis    Elevated LFTs    Fall    Family history of anesthesia complication    father gets pneumonia after "   GERD (gastroesophageal reflux disease)    Hyperlipidemia    Hyperplastic colon polyp    Hypertension    Hypertensive retinopathy    OU   Internal hemorrhoids    Tinnitus    Tubular adenoma of colon    Wears glasses    Past Surgical History:  Procedure Laterality Date   ATRIAL ABLATION SURGERY  03/24/11   PVI by [place] N/A 07/24/2019   Procedure: [place];  Surgeon: [name], MD;  Location: [place] [facility];  Service: Cardiovascular;  Laterality: N/A;   BACK SURGERY     BRONCHOSCOPY  01/23/2013    biopsy   CHOLECYSTECTOMY  05/02/2014   LAP CHOLE   CHOLECYSTECTOMY N/A 05/02/2014   Procedure: LAPAROSCOPIC CHOLECYSTECTOMY WITH INTRAOPERATIVE CHOLANGIOGRAM;  Surgeon: Erroll Luna, MD;  Location: [place];  Service: General;  Laterality: N/A;   EP IMPLANTABLE DEVICE N/A 06/27/2015   Procedure: Loop Recorder Insertion;  Surgeon: [name], MD;  Location: [place] [facility];  Service: Cardiovascular;  Laterality: N/A;   ERCP N/A 05/25/2014   Procedure: ENDOSCOPIC RETROGRADE CHOLANGIOPANCREATOGRAPHY (ERCP);  Surgeon: [name], MD;  Location: [place];  Service: Endoscopy;  Laterality: N/A;    Fistula surgery     LOOP RECORDER REMOVAL N/A 07/24/2019   Procedure: LOOP RECORDER REMOVAL;  Surgeon: [name], MD;  Location: [place] [facility];  Service: Cardiovascular;  Laterality: N/A;   MEDIASTINOSCOPY N/A 01/23/2013   Procedure: MEDIASTINOSCOPY;  Surgeon: Ivin Poot, MD;  Location: [place];  Service: Thoracic;  Laterality: N/A;   QUADRICEPS TENDON REPAIR Right 01/13/2016   Procedure: REPAIR QUADRICEP TENDON;  Surgeon: Latanya Maudlin, MD;  Location: [facility];  Service: Orthopedics;  Laterality: Right;  Received femoral nerve block in holding   SHOULDER SURGERY     x 2   TONSILLECTOMY     VIDEO BRONCHOSCOPY WITH ENDOBRONCHIAL ULTRASOUND N/A 01/23/2013   Procedure: VIDEO BRONCHOSCOPY WITH ENDOBRONCHIAL ULTRASOUND;  Surgeon: Ivin Poot, MD;  Location: [facility];  Service: Thoracic;  Laterality: N/A;    ROS- all systems are reviewed and negatives except as per HPI above  Current Outpatient Medications  Medication Sig Dispense Refill   acetaminophen (TYLENOL) 500 MG tablet Take 2 tablets (1,000 mg total) by mouth every 6 (six) hours as needed. 30 tablet 0   albuterol (VENTOLIN HFA) 108 (90 Base) MCG/ACT inhaler INHALE 2 PUFFS BY MOUTH EVERY 6 HOURS AS NEEDED FOR WHEEZE     apixaban (ELIQUIS) 5 MG TABS tablet Take 1 tablet (5 mg total) by mouth 2 (two) times daily. 60 tablet 3   Biotin 5000  MCG TABS Take 800 mcg by mouth daily.      brimonidine-timolol (COMBIGAN) 0.2-0.5 % ophthalmic solution      Coenzyme Q10 (COQ10 PO) Take 120 mg by mouth every evening.     dicyclomine (BENTYL) 20 MG tablet Take 1 tablet (20 mg total) by mouth 3 (three) times daily as needed for spasms (cramping abdominal discomfort/IBS). 90 tablet 1   fluticasone (FLONASE) 50 MCG/ACT nasal spray Place 1 spray into the nose daily as needed for allergies.      folic acid (FOLVITE) 588 MCG tablet Take 400 mcg by mouth daily.     hydrochlorothiazide (HYDRODIURIL) 12.5 MG tablet Take 12.5 mg by mouth daily.  3    losartan (COZAAR) 100 MG tablet Take 100 mg by mouth every evening.   3   metFORMIN (GLUCOPHAGE-XR) 750 MG 24 hr tablet Take 1,000 mg by mouth daily.     Multiple Vitamin (MULTIVITAMIN WITH MINERALS) TABS tablet Take 1 tablet by mouth daily. Centrum Silver     mupirocin ointment (BACTROBAN) 2 % Apply 1 application topically daily. Qd to wound on back until resolved 22 g 1   Omega-3 Fatty Acids (FISH OIL PO) Take 1 capsule by mouth 2 (two) times daily.     omeprazole (PRILOSEC) 40 MG capsule TAKE 1 CAPSULE BY MOUTH EVERY DAY 90 capsule 3   rifaximin (XIFAXAN) 550 MG TABS tablet Take 1 tablet (550 mg total) by mouth 3 (three) times daily. 42 tablet 0   simvastatin (ZOCOR) 20 MG tablet TAKE 1 TABLET BY MOUTH EVERYDAY AT BEDTIME. Call and schedule follow up office visit to receive further refills. Thank you. [phone]. 15 tablet 0   budesonide-formoterol (SYMBICORT) 160-4.5 MCG/ACT inhaler Inhale into the lungs.     glipiZIDE (GLUCOTROL XL) 5 MG 24 hr tablet Take 5 mg by mouth daily.     No current facility-administered medications for this visit.    Physical Exam: Vitals:   06/10/21 1603  BP: 126/72  Pulse: 60  SpO2: 96%  Weight: 215 lb 9.6 oz (97.8 kg)  Height: 5\' 7"  (1.702 m)    GEN- The patient is well appearing, alert and oriented x 3 today.   Head- normocephalic, atraumatic Eyes-  Sclera clear, conjunctiva pink Ears- hearing intact Oropharynx- clear Lungs- Clear to ausculation bilaterally, normal work of breathing Heart- Regular rate and rhythm, no murmurs, rubs or gallops, PMI not laterally displaced GI- soft, NT, ND, + BS Extremities- no clubbing, cyanosis, or edema  Wt Readings from Last 3 Encounters:  06/10/21 215 lb 9.6 oz (97.8 kg)  05/07/21 215 lb 2 oz (97.6 kg)  01/21/20 225 lb (102.1 kg)    EKG tracing ordered today is personally reviewed and shows sinus  Assessment and Plan:  Paroxysmal atrial fibrillation Chads2vasc score is 2.  He is on eliquis Cbc and  bmet 06/08/21 reviewed  2. HTN Stable No change required today Bmet 06/08/21 reviewed  3. HL Labs 06/08/21 reviewed.  LDL was 104.  He wishes to switch back to crestor.  Stop simvastatin today and start crestor 10mg  daily. He will follow-up with PCP for repeat labs in 6-8 weeks.   Risks, benefits and potential toxicities for medications prescribed and/or refilled reviewed with patient today.    Return to see EP APP in a year Could also follow-up in [place] with EP thereafter.  [name] MD, [facility] 06/10/2021 4:17 PM

## 2021-06-10 NOTE — Patient Instructions (Addendum)
Medication Instructions:  Crestor 10 mg daily Your physician recommends that you continue on your current medications as directed. Please refer to the Current Medication list given to you today. *If you need a refill on your cardiac medications before your next appointment, please call your pharmacy*  Lab Work: None. If you have labs (blood work) drawn today and your tests are completely normal, you will receive your results only by: [place] (if you have MyChart) OR A paper copy in the mail If you have any lab test that is abnormal or we need to change your treatment, we will call you to review the results.  Testing/Procedures: None.  Follow-Up: At [facility], you and your health needs are our priority.  As part of our continuing mission to provide you with exceptional heart care, we have created designated Provider Care Teams.  These Care Teams include your primary Cardiologist (physician) and Advanced Practice Providers (APPs -  Physician Assistants and Nurse Practitioners) who all work together to provide you with the care you need, when you need it.  Your physician wants you to follow-up in: 12 months with  [name], MD or one of the following Advanced Practice Providers on your designated Care Team:    Tommye Standard, [place] Legrand Como "Jonni Sanger" [place], [place]   You will receive a reminder letter in the mail two months in advance. If you don't receive a letter, please call our office to schedule the follow-up appointment.  We recommend signing up for the patient portal called "MyChart".  Sign up information is provided on this After Visit Summary.  MyChart is used to connect with patients for Virtual Visits (Telemedicine).  Patients are able to view lab/test results, encounter notes, upcoming appointments, etc.  Non-urgent messages can be sent to your provider as well.   To learn more about what you can do with MyChart, go to [email or web address].    Any Other Special  Instructions Will Be Listed Below (If Applicable).

## 2021-06-16 ENCOUNTER — Encounter: Payer: 59 | Admitting: Dermatology

## 2021-07-09 ENCOUNTER — Encounter: Payer: Self-pay | Admitting: Internal Medicine

## 2021-07-09 ENCOUNTER — Ambulatory Visit: Payer: 59 | Admitting: Internal Medicine

## 2021-07-09 VITALS — BP 130/82 | HR 71 | Ht 67.0 in | Wt 220.0 lb

## 2021-07-09 DIAGNOSIS — R143 Flatulence: Secondary | ICD-10-CM

## 2021-07-09 DIAGNOSIS — R195 Other fecal abnormalities: Secondary | ICD-10-CM | POA: Diagnosis not present

## 2021-07-09 DIAGNOSIS — K219 Gastro-esophageal reflux disease without esophagitis: Secondary | ICD-10-CM

## 2021-07-09 NOTE — Progress Notes (Signed)
   Subjective:    Patient ID: Jorge Peterson, male    DOB: [birth year], 60 y.o.   MRN: [id number]  HPI Jorge Peterson is a 60 year old with a history of complicated diverticulitis status post sigmoid resection in November 2019, history of adenomatous colon polyps, GERD who is here for follow-up.  He is here alone today and was last seen in the office on 05/01/21.  At last visit we discussed loose stools with lower abdominal pain and altered bowel habit.  We did a CT scan which was unremarkable.  He tried Metamucil but was still having episodic explosive stools which were urgent and difficult to control.  Eventually on my recommendation and after checking with primary care he stopped metformin.  His symptoms have completely resolved.  He does notice still issues with gas and he has been trying Beano.  No further explosive diarrhea, loose stools or abdominal pain.  He has noticed increased gas but no other issues.  His metformin which was stopped has been changed to Ozempic which he has been taking for 3 weeks.  Omeprazole continues and controls his reflux well.   Review of Systems As per HPI, otherwise negative  Current Medications, Allergies, Past Medical History, Past Surgical History, Family History and Social History were reviewed in [employer] record.    Objective:   Physical Exam BP 130/82   Pulse 71   Ht 5\' 7"  (1.702 m)   Wt 220 lb (99.8 kg)   BMI 34.46 kg/m  Gen: awake, alert, NAD HEENT: anicteric Abd: soft, NT/ND, +BS throughout Ext: no c/c/e Neuro: nonfoca      Assessment & Plan:  60 year old with a history of complicated diverticulitis status post sigmoid resection in November 2019, history of adenomatous colon polyps, GERD who is here for follow-up.  Loose stools/lower abdominal discomfort --resolved after stopping metformin.  No further evaluation at this time  2.  GERD --well-controlled, continue omeprazole 40 mg daily  3.  History of  adenomatous colon polyps --surveillance colonoscopy November 2024  4.  Flatulence --he can continue to try Beano or Gas-X.  If unhelpful try Align 1 capsule daily x1 to 2 months  Follow-up as needed  20 minutes total spent today including patient facing time, coordination of care, reviewing medical history/procedures/pertinent radiology studies, and documentation of the encounter.

## 2021-07-09 NOTE — Patient Instructions (Signed)
Continue omeprazole.  Please purchase the following medications over the counter and take as directed: Beano vs Gas-X vs. Align for gas  Please follow up with Dr Hilarie Fredrickson in 1 year.  If you are age 62 or older, your body mass index should be between 23-30. Your Body mass index is 34.46 kg/m. If this is out of the aforementioned range listed, please consider follow up with your Primary Care Provider.  If you are age 17 or younger, your body mass index should be between 19-25. Your Body mass index is 34.46 kg/m. If this is out of the aformentioned range listed, please consider follow up with your Primary Care Provider.   ________________________________________________________  The  GI providers would like to encourage you to use [facility] to communicate with providers for non-urgent requests or questions.  Due to long hold times on the telephone, sending your provider a message by [facility] may be a faster and more efficient way to get a response.  Please allow 48 business hours for a response.  Please remember that this is for non-urgent requests.  _______________________________________________________  Due to recent changes in healthcare laws, you may see the results of your imaging and laboratory studies on MyChart before your provider has had a chance to review them.  We understand that in some cases there may be results that are confusing or concerning to you. Not all laboratory results come back in the same time frame and the provider may be waiting for multiple results in order to interpret others.  Please give [place] 48 hours in order for your provider to thoroughly review all the results before contacting the office for clarification of your results.

## 2021-07-23 ENCOUNTER — Other Ambulatory Visit: Payer: Self-pay | Admitting: Internal Medicine

## 2021-07-27 ENCOUNTER — Other Ambulatory Visit: Payer: Self-pay | Admitting: Specialist

## 2021-07-27 DIAGNOSIS — J90 Pleural effusion, not elsewhere classified: Secondary | ICD-10-CM

## 2021-07-27 DIAGNOSIS — R918 Other nonspecific abnormal finding of lung field: Secondary | ICD-10-CM

## 2021-07-27 NOTE — Telephone Encounter (Signed)
Prescription refill request for Eliquis received. Indication: afib  Last office visit: 06/10/2021 Scr: 1.0, 06/08/2021 Age: 60  Weight: 99.8 kg   Refilll sent.

## 2021-07-28 ENCOUNTER — Encounter: Payer: 59 | Admitting: Dermatology

## 2021-08-13 ENCOUNTER — Other Ambulatory Visit: Payer: Self-pay

## 2021-08-13 ENCOUNTER — Ambulatory Visit: Payer: 59 | Admitting: Dermatology

## 2021-08-13 DIAGNOSIS — L720 Epidermal cyst: Secondary | ICD-10-CM | POA: Diagnosis not present

## 2021-08-13 DIAGNOSIS — L821 Other seborrheic keratosis: Secondary | ICD-10-CM

## 2021-08-13 DIAGNOSIS — Z1283 Encounter for screening for malignant neoplasm of skin: Secondary | ICD-10-CM | POA: Diagnosis not present

## 2021-08-13 DIAGNOSIS — Z872 Personal history of diseases of the skin and subcutaneous tissue: Secondary | ICD-10-CM

## 2021-08-13 DIAGNOSIS — D1800 Hemangioma unspecified site: Secondary | ICD-10-CM

## 2021-08-13 DIAGNOSIS — L570 Actinic keratosis: Secondary | ICD-10-CM

## 2021-08-13 DIAGNOSIS — D229 Melanocytic nevi, unspecified: Secondary | ICD-10-CM | POA: Diagnosis not present

## 2021-08-13 DIAGNOSIS — L814 Other melanin hyperpigmentation: Secondary | ICD-10-CM

## 2021-08-13 DIAGNOSIS — L578 Other skin changes due to chronic exposure to nonionizing radiation: Secondary | ICD-10-CM | POA: Diagnosis not present

## 2021-08-13 NOTE — Patient Instructions (Addendum)
Jorge Peterson [id number]   You have an upcoming surgery appointment scheduled at [facility]. @FUTURESURGAPPT @  PRE-OPERATIVE INSTRUCTIONS  We recommend you read the following instructions. If you have any questions or concerns, please call the office at [phone].  Shower and wash the entire body with soap and water the day of your surgery paying special attention to cleansing at and around the planned surgery site. Avoid aspirin or aspirin containing products at least ten (10) days prior to your surgical procedure and for at least one week (7 days) after your surgical procedure. If you take aspirin on a regular basis for heart disease or history of stroke or for any other reason, we may recommend you continue taking aspirin but please notify us if you take this on a regular basis. Aspirin can cause more bleeding to occur during surgery as well as prolonged bleeding and bruising after surgery. Avoid other nonsteroidal pain medications at least one week prior to surgery and at least one week after your surgery. These include medications such as Ibuprofen (Motrin, Advil and Nuprin), Naprosyn, Voltaren, Relafen, etc. If these medications are used for therapeutic reasons, please inform us as they can cause increased bleeding or prolonged bleeding during and bruising after surgical procedures.  Please advise [place] if you are taking any blood thinner medications such as Coumadin or Dipyridamole or Plavix or similar medications. These cause increased bleeding and prolonged bleeding during procedures and bruising after surgical procedures. We may have to consider discontinuing these medications briefly prior to and shortly after your surgery if safe to do so. Please inform us of all medications you are currently taking. All medications that are taken regularly should be taken the day of surgery as you always do. Nevertheless, we need to be informed of what  medications you are taking prior to surgery to know whether they will affect the procedure or cause any complications. Please inform us of any medication allergies. Also inform us of whether you have allergies to Latex or rubber products or whether you have had any adverse reaction to Lidocaine or Epinephrine. Please inform us of any prosthetic or artificial body parts such as artificial heart valve, joint replacements, etc., or similar condition that might require preoperative antibiotics. We recommend avoidance of alcohol at least two weeks prior to surgery and continued avoidance for at least two weeks after surgery. We recommend avoidance of tobacco smoking at least two weeks prior to surgery and continued abstinence for at least two weeks after surgery. Do not plan strenuous exercise, strenuous work or strenuous lifting for approximately four weeks after your surgery. We request if you are unable to make your scheduled surgical appointment, please call us at least a week in advance or as soon as you are aware of a problem so that we can cancel or reschedule the appointment. You MAY TAKE TYLENOL (acetaminophen) for pain as it is not a blood thinner. PLEASE PLAN TO BE IN TOWN FOR TWO WEEKS FOLLOWING SURGERY. THIS IS IMPORTANT SO YOU CAN BE CHECKED FOR DRESSING CHANGES, SUTURE REMOVAL AND TO MONITOR FOR POSSIBLE COMPLICATIONS.   Cryotherapy Aftercare  Wash gently with soap and water everyday.   Apply Vaseline and Band-Aid daily until healed.     If You Need Anything After Your Visit  If you have any questions or concerns for your doctor, please call our main line at [phone] and press option 4 to reach your doctor's medical assistant. If no  one answers, please leave a voicemail as directed and we will return your call as soon as possible. Messages left after 4 pm will be answered the following business day.   You may also send [place] a message via [place]. We typically respond to MyChart  messages within 1-2 business days.  For prescription refills, please ask your pharmacy to contact our office. Our fax number is [phone].  If you have an urgent issue when the clinic is closed that cannot wait until the next business day, you can page your doctor at the number below.    Please note that while we do our best to be available for urgent issues outside of office hours, we are not available 24/7.   If you have an urgent issue and are unable to reach [place], you may choose to seek medical care at your doctor's office, retail clinic, urgent care center, or emergency room.  If you have a medical emergency, please immediately call 911 or go to the emergency department.  Pager Numbers  - Dr. Nehemiah Massed: [phone]  - Dr. Laurence Ferrari: [phone]  - Dr. Nicole Kindred: [phone]  In the event of inclement weather, please call our main line at [phone] for an update on the status of any delays or closures.  Dermatology Medication Tips: Please keep the boxes that topical medications come in in order to help keep track of the instructions about where and how to use these. Pharmacies typically print the medication instructions only on the boxes and not directly on the medication tubes.   If your medication is too expensive, please contact our office at [phone] option 4 or send [place] a message through [place].   We are unable to tell what your co-pay for medications will be in advance as this is different depending on your insurance coverage. However, we may be able to find a substitute medication at lower cost or fill out paperwork to get insurance to cover a needed medication.   If a prior authorization is required to get your medication covered by your insurance company, please allow [place] 1-2 business days to complete this process.  Drug prices often vary depending on where the prescription is filled and some pharmacies may offer cheaper prices.  The website www.goodrx.com contains  coupons for medications through different pharmacies. The prices here do not account for what the cost may be with help from insurance (it may be cheaper with your insurance), but the website can give you the price if you did not use any insurance.  - You can print the associated coupon and take it with your prescription to the pharmacy.  - You may also stop by our office during regular business hours and pick up a GoodRx coupon card.  - If you need your prescription sent electronically to a different pharmacy, notify our office through [facility] or by phone at [phone] option 4.     Si Usted Necesita Algo Despus de Su Visita  Tambin puede enviarnos un mensaje a travs de Pharmacist, community. Por lo general respondemos a los mensajes de MyChart en el transcurso de 1 a 2 das hbiles.  Para renovar recetas, por favor pida a su farmacia que se ponga en contacto con nuestra oficina. Harland Dingwall de fax es [place] [phone].  Si tiene un asunto urgente cuando la clnica est cerrada y que no puede esperar hasta el siguiente da hbil, puede llamar/localizar a su doctor(a) al nmero que aparece a continuacin.   Por favor, tenga en cuenta  que aunque hacemos todo lo posible para estar disponibles para asuntos urgentes fuera del horario de [place], no estamos disponibles las 24 horas del da, los 7 das de la [place].   Si tiene un problema urgente y no puede comunicarse con nosotros, puede optar por buscar atencin mdica  en el consultorio de su doctor(a), en una clnica privada, en un centro de atencin urgente o en una sala de emergencias.  Si tiene Engineering geologist, por favor llame inmediatamente al 911 o vaya a la sala de emergencias.  Nmeros de bper  - Dr. Nehemiah Massed: [phone]  - Dra. Moye: [phone]  - Dra. Nicole Kindred: [phone]  En caso de inclemencias del [place], por favor llame a Johnsie Kindred principal al [phone] para una actualizacin sobre el [place] de  cualquier retraso o cierre.  Consejos para la medicacin en dermatologa: Por favor, guarde las cajas en las que vienen los medicamentos de uso tpico para ayudarle a seguir las instrucciones sobre dnde y cmo usarlos. Las farmacias generalmente imprimen las instrucciones del medicamento slo en las cajas y no directamente en los tubos del [place].   Si su medicamento es muy caro, por favor, pngase en contacto con Zigmund Daniel llamando al [phone] y presione la opcin 4 o envenos un mensaje a travs de Pharmacist, community.   No podemos decirle cul ser su copago por los medicamentos por adelantado ya que esto es diferente dependiendo de la cobertura de su seguro. Sin embargo, es posible que podamos encontrar un medicamento sustituto a Electrical engineer un formulario para que el seguro cubra el medicamento que se considera necesario.   Si se requiere una autorizacin previa para que su compaa de seguros [place] su medicamento, por favor permtanos de 1 a 2 das hbiles para completar este proceso.  Los precios de los medicamentos varan con frecuencia dependiendo del Environmental consultant de dnde se surte la receta y alguna farmacias pueden ofrecer precios ms baratos.  El sitio web www.goodrx.com tiene cupones para medicamentos de Airline pilot. Los precios aqu no tienen en cuenta lo que podra costar con la ayuda del seguro (puede ser ms barato con su seguro), pero el sitio web puede darle el precio si no utiliz Research scientist (physical sciences).  - Puede imprimir el cupn correspondiente y llevarlo con su receta a la farmacia.  - Tambin puede pasar por nuestra oficina durante el horario de atencin regular y Charity fundraiser una tarjeta de cupones de GoodRx.  - Si necesita que su receta se enve electrnicamente a una farmacia diferente, informe a nuestra oficina a travs de MyChart de [place] o por telfono llamando al [phone] y presione la opcin 4.

## 2021-08-13 NOTE — Progress Notes (Signed)
° °  Follow-Up Visit   Subjective  Jorge Peterson is a 61 y.o. male who presents for the following: Annual Exam (History of AK - UBSE today). The patient presents for Total-Body Skin Exam (TBSE) for skin cancer screening and mole check.  The patient has spots, moles and lesions to be evaluated, some may be new or changing and the patient has concerns that these could be cancer.  The following portions of the chart were reviewed this encounter and updated as appropriate:   Tobacco   Allergies   Meds   Problems   Med Hx   Surg Hx   Fam Hx      Review of Systems:  No other skin or systemic complaints except as noted in HPI or Assessment and Plan.  Objective  Well appearing patient in no apparent distress; mood and affect are within normal limits.  All skin waist up examined.  Left mid lat back 2.1 cm subcutaneous nodule.   Face (5) Erythematous thin papules/macules with gritty scale.     Assessment & Plan   Lentigines - Scattered tan macules - Due to sun exposure - Benign-appearing, observe - Recommend daily broad spectrum sunscreen SPF 30+ to sun-exposed areas, reapply every 2 hours as needed. - Call for any changes  Seborrheic Keratoses - Stuck-on, waxy, tan-brown papules and/or plaques  - Benign-appearing - Discussed benign etiology and prognosis. - Observe - Call for any changes  Melanocytic Nevi - Tan-brown and/or pink-flesh-colored symmetric macules and papules - Benign appearing on exam today - Observation - Call clinic for new or changing moles - Recommend daily use of broad spectrum spf 30+ sunscreen to sun-exposed areas.   Hemangiomas - Red papules - Discussed benign nature - Observe - Call for any changes  Actinic Damage - Chronic condition, secondary to cumulative UV/sun exposure - diffuse scaly erythematous macules with underlying dyspigmentation - Recommend daily broad spectrum sunscreen SPF 30+ to sun-exposed areas, reapply every 2 hours as needed.   - Staying in the shade or wearing long sleeves, sun glasses (UVA+UVB protection) and wide brim hats (4-inch brim around the entire circumference of the hat) are also recommended for sun protection.  - Call for new or changing lesions.  Skin cancer screening performed today.  Epidermal inclusion cyst Left mid lat back Surgery is scheduled for 08/18/2021  AK (actinic keratosis) (5) Face Destruction of lesion - Face Complexity: simple   Destruction method: cryotherapy   Informed consent: discussed and consent obtained   Timeout:  patient name, date of birth, surgical site, and procedure verified Lesion destroyed using liquid nitrogen: Yes   Region frozen until ice ball extended beyond lesion: Yes   Outcome: patient tolerated procedure well with no complications   Post-procedure details: wound care instructions given    Skin cancer screening  Return for Follow up as scheduled for cyst surgery.  I, Ashok Cordia, CMA, am acting as scribe for Sarina Ser, MD . Documentation: I have reviewed the above documentation for accuracy and completeness, and I agree with the above.  Sarina Ser, MD

## 2021-08-18 ENCOUNTER — Telehealth: Payer: Self-pay

## 2021-08-18 ENCOUNTER — Ambulatory Visit: Payer: 59 | Admitting: Dermatology

## 2021-08-18 ENCOUNTER — Encounter: Payer: Self-pay | Admitting: Dermatology

## 2021-08-18 ENCOUNTER — Other Ambulatory Visit: Payer: Self-pay

## 2021-08-18 DIAGNOSIS — D492 Neoplasm of unspecified behavior of bone, soft tissue, and skin: Secondary | ICD-10-CM

## 2021-08-18 DIAGNOSIS — L720 Epidermal cyst: Secondary | ICD-10-CM | POA: Diagnosis not present

## 2021-08-18 MED ORDER — MUPIROCIN 2 % EX OINT: 1.0000 "application " | TOPICAL_OINTMENT | Freq: Every day | CUTANEOUS | 1 refills | Status: DC

## 2021-08-18 NOTE — Patient Instructions (Signed)

## 2021-08-18 NOTE — Telephone Encounter (Signed)
Pt doing fine after today's surgery./sh 

## 2021-08-18 NOTE — Progress Notes (Signed)
° °  Follow-Up Visit   Subjective  Jorge Peterson is a 60 y.o. male who presents for the following: cyst vs other (L mid lat back, pt presents for excision).  The following portions of the chart were reviewed this encounter and updated as appropriate:   Tobacco   Allergies   Meds   Problems   Med Hx   Surg Hx   Fam Hx      Review of Systems:  No other skin or systemic complaints except as noted in HPI or Assessment and Plan.  Objective  Well appearing patient in no apparent distress; mood and affect are within normal limits.  A focused examination was performed including back. Relevant physical exam findings are noted in the Assessment and Plan.  L mid lat back Cystic pap 2.6 x 1.2cm   Assessment & Plan  Neoplasm of skin L mid lat back  Skin excision  Lesion length (cm):  2.6 Lesion width (cm):  1.2 Margin per side (cm):  0 Total excision diameter (cm):  2.6 Informed consent: discussed and consent obtained   Timeout: patient name, date of birth, surgical site, and procedure verified   Procedure prep:  Patient was prepped and draped in usual sterile fashion Prep type:  Isopropyl alcohol and povidone-iodine Anesthesia: the lesion was anesthetized in a standard fashion   Anesthetic:  1% lidocaine w/ epinephrine 1-100,000 buffered w/ 8.4% NaHCO3 (4.0cc lido w/ epi, 3.0cc bupivicaine, Total of 7.0cc) Instrument used: #15 blade   Hemostasis achieved with: pressure   Hemostasis achieved with comment:  Electrocautery Outcome: patient tolerated procedure well with no complications   Post-procedure details: sterile dressing applied and wound care instructions given   Dressing type: bandage and pressure dressing (Mupirocin)    Skin repair Complexity:  Complex Final length (cm):  4.5 Reason for type of repair: reduce tension to allow closure, reduce the risk of dehiscence, infection, and necrosis, reduce subcutaneous dead space and avoid a hematoma, allow closure of the large  defect, preserve normal anatomy, preserve normal anatomical and functional relationships and enhance both functionality and cosmetic results   Undermining: area extensively undermined   Undermining comment:  Undermining Defect 2.6cm Subcutaneous layers (deep stitches):  Suture size:  2-0 Suture type: Vicryl (polyglactin 910)   Subcutaneous suture technique: Inverted Dermal. Fine/surface layer approximation (top stitches):  Suture size:  3-0 Suture type: nylon   Stitches: simple running   Suture removal (days):  7 Hemostasis achieved with: pressure Outcome: patient tolerated procedure well with no complications   Post-procedure details: sterile dressing applied and wound care instructions given   Dressing type: bandage, pressure dressing and bacitracin (Mupirocin)    mupirocin ointment (BACTROBAN) 2 % Apply 1 application topically daily. Qd to excision site  Specimen 1 - Surgical pathology Differential Diagnosis: D48.5 Cyst vs other  Check Margins: No Cystic pap 2.6 x 1.2cm  Cyst vs other, L mid lat back, excised today  Start Mupirocin oint qd to excision site   Return in about 1 week (around 08/25/2021) for suture removal.  I, Othelia Pulling, RMA, am acting as scribe for Sarina Ser, MD . Documentation: I have reviewed the above documentation for accuracy and completeness, and I agree with the above.  Sarina Ser, MD

## 2021-08-25 ENCOUNTER — Ambulatory Visit: Payer: 59

## 2021-08-25 ENCOUNTER — Other Ambulatory Visit: Payer: Self-pay

## 2021-08-25 ENCOUNTER — Telehealth: Payer: Self-pay

## 2021-08-25 DIAGNOSIS — L720 Epidermal cyst: Secondary | ICD-10-CM

## 2021-08-25 NOTE — Progress Notes (Signed)
° °  Follow-Up Visit   Subjective  Jorge Peterson is a 60 y.o. male who presents for the following: Suture / Staple Removal (Pt here for 7 day suture removal for in office excision of biopsy proven benign cyst).    The following portions of the chart were reviewed this encounter and updated as appropriate:       Objective  Well appearing patient in no apparent distress; mood and affect are within normal limits.   Left mid lat back Suture removal for biopsy proven epidermoid cyst.   Incision site is clean, dry and intact    Assessment & Plan  Epidermoid cyst Left mid lat back  Encounter for Removal of Sutures - Incision site at the left mid lat back is clean, dry and intact - Wound cleansed, sutures removed, wound cleansed and steri strips applied.  - Discussed pathology results showing epidermoid cyst.  - Patient advised to keep steri-strips dry until they fall off. - Scars remodel for a full year. - Once steri-strips fall off, patient can apply over-the-counter silicone scar cream each night to help with scar remodeling if desired. - Patient advised to call with any concerns or if they notice any new or changing lesions.    No follow-ups on file.  I, Harriett Sine, CMA, am acting as scribe for [employer], CMA.

## 2021-08-25 NOTE — Telephone Encounter (Signed)
-----   Message from Ralene Bathe, MD sent at 08/24/2021  8:01 PM EST ----- Diagnosis Skin (M), left mid lat back EXCISION, EPIDERMOID CYST, INFLAMED, MARGINS FREE  Benign cyst

## 2021-08-25 NOTE — Telephone Encounter (Signed)
Pt informed of results at suture removal appt. He had no concerns.

## 2021-08-30 ENCOUNTER — Encounter: Payer: Self-pay | Admitting: Dermatology

## 2021-08-31 NOTE — Progress Notes (Signed)
[facility] Note  09/01/2021     CHIEF COMPLAINT Patient presents for Retina Follow Up  HISTORY OF PRESENT ILLNESS: Jorge Peterson is a 61 y.o. male who presents to the clinic today for:  HPI     Retina Follow Up   Patient presents with  Other.  In right eye.  Severity is moderate.  Duration of 12 months.  Since onset it is stable.  I, the attending physician,  performed the HPI with the patient and updated documentation appropriately.        Comments   Pt here for 12 mo ret f/u for Coats Disease OD. Pt states vision is unchanged, no issues reported. Pt is still wearing same rx specs from last year. Reports taking Combigan OD 1gtt every other day, still under the care of Dr. Ellin Mayhew. Most recent A1C level reported 6.7. Blood sugar this am was 118.       Last edited by Bernarda Caffey, MD on 09/01/2021 12:58 PM.    Pt states no change in vision, he is still using Combigan in the right eye, but states he usually only uses it about every other day instead of every day  Referring physician: Anell Barr, OD [place],  [place] [address]  HISTORICAL INFORMATION:   Selected notes from the MEDICAL RECORD NUMBER Referred by Dr. Ellin Mayhew for retinal vasculitis OD   CURRENT MEDICATIONS: Current Outpatient Medications (Ophthalmic Drugs)  Medication Sig   brimonidine-timolol (COMBIGAN) 0.2-0.5 % ophthalmic solution    No current facility-administered medications for this visit. (Ophthalmic Drugs)   Current Outpatient Medications (Other)  Medication Sig   acetaminophen (TYLENOL) 500 MG tablet Take 2 tablets (1,000 mg total) by mouth every 6 (six) hours as needed.   albuterol (VENTOLIN HFA) 108 (90 Base) MCG/ACT inhaler INHALE 2 PUFFS BY MOUTH EVERY 6 HOURS AS NEEDED FOR WHEEZE   apixaban (ELIQUIS) 5 MG TABS tablet TAKE 1 TABLET(5 MG) BY MOUTH TWICE DAILY   Biotin 5000 MCG TABS Take 800 mcg by mouth daily.    Coenzyme Q10 (COQ10 PO) Take 120 mg by  mouth every evening.   Dulaglutide (TRULICITY) 9.40 HW/8.0SU SOPN Inject 0.75 mg into the skin once a week.   fluticasone (FLONASE) 50 MCG/ACT nasal spray Place 1 spray into the nose daily as needed for allergies.    folic acid (FOLVITE) 110 MCG tablet Take 400 mcg by mouth daily.   glipiZIDE (GLUCOTROL XL) 5 MG 24 hr tablet Take 5 mg by mouth daily.   hydrochlorothiazide (HYDRODIURIL) 12.5 MG tablet Take 12.5 mg by mouth daily.   losartan (COZAAR) 100 MG tablet Take 100 mg by mouth every evening.    Multiple Vitamin (MULTIVITAMIN WITH MINERALS) TABS tablet Take 1 tablet by mouth daily. Centrum Silver   mupirocin ointment (BACTROBAN) 2 % Apply 1 application topically daily. Qd to excision site   Omega-3 Fatty Acids (FISH OIL PO) Take 1 capsule by mouth 2 (two) times daily.   omeprazole (PRILOSEC) 40 MG capsule TAKE 1 CAPSULE BY MOUTH EVERY DAY   rosuvastatin (CRESTOR) 10 MG tablet Take 1 tablet (10 mg total) by mouth daily.   budesonide-formoterol (SYMBICORT) 160-4.5 MCG/ACT inhaler Inhale into the lungs.   dicyclomine (BENTYL) 20 MG tablet Take 1 tablet (20 mg total) by mouth 3 (three) times daily as needed for spasms (cramping abdominal discomfort/IBS).   metFORMIN (GLUCOPHAGE-XR) 750 MG 24 hr tablet Take 1,000 mg by mouth daily.   No  current facility-administered medications for this visit. (Other)   REVIEW OF SYSTEMS: ROS   Positive for: Gastrointestinal, Endocrine, Cardiovascular, Eyes Negative for: Constitutional, Neurological, Skin, Genitourinary, Musculoskeletal, HENT, Psychiatric, Allergic/Imm, Heme/Lymph Last edited by Kingsley Spittle, COT on 09/01/2021  8:57 AM.     ALLERGIES Allergies  Allergen Reactions   Codeine Itching and Rash   Latex Itching and Rash   Oxycodone-Acetaminophen Itching and Rash   PAST MEDICAL HISTORY Past Medical History:  Diagnosis Date   Atrial fibrillation [facility])    s/p afib ablation 7/12 and 11/20   Bradycardia    asymptomatic   Cataract     Mixed OU   Colonic diverticular abscess    Diabetes ([place])    Diverticulitis    Elevated LFTs    Fall    Family history of anesthesia complication    father gets pneumonia after "   GERD (gastroesophageal reflux disease)    Hyperlipidemia    Hyperplastic colon polyp    Hypertension    Hypertensive retinopathy    OU   Internal hemorrhoids    Tinnitus    Tubular adenoma of colon    Wears glasses    Past Surgical History:  Procedure Laterality Date   ATRIAL ABLATION SURGERY  03/24/11   PVI by [place] N/A 07/24/2019   Procedure: [place];  Surgeon: [name], MD;  Location: [facility];  Service: Cardiovascular;  Laterality: N/A;   BACK SURGERY     BRONCHOSCOPY  01/23/2013    biopsy   CHOLECYSTECTOMY  05/02/2014   LAP CHOLE   CHOLECYSTECTOMY N/A 05/02/2014   Procedure: LAPAROSCOPIC CHOLECYSTECTOMY WITH INTRAOPERATIVE CHOLANGIOGRAM;  Surgeon: Erroll Luna, MD;  Location: [facility];  Service: General;  Laterality: N/A;   EP IMPLANTABLE DEVICE N/A 06/27/2015   Procedure: Loop Recorder Insertion;  Surgeon: [name], MD;  Location: [facility];  Service: Cardiovascular;  Laterality: N/A;   ERCP N/A 05/25/2014   Procedure: ENDOSCOPIC RETROGRADE CHOLANGIOPANCREATOGRAPHY (ERCP);  Surgeon: [name], MD;  Location: [facility];  Service: Endoscopy;  Laterality: N/A;   Fistula surgery     LOOP RECORDER REMOVAL N/A 07/24/2019   Procedure: LOOP RECORDER REMOVAL;  Surgeon: [name], MD;  Location: [facility];  Service: Cardiovascular;  Laterality: N/A;   MEDIASTINOSCOPY N/A 01/23/2013   Procedure: MEDIASTINOSCOPY;  Surgeon: Ivin Poot, MD;  Location: [facility];  Service: Thoracic;  Laterality: N/A;   QUADRICEPS TENDON REPAIR Right 01/13/2016   Procedure: REPAIR QUADRICEP TENDON;  Surgeon: Latanya Maudlin, MD;  Location: [facility];  Service: Orthopedics;  Laterality: Right;  Received femoral nerve block in holding    SHOULDER SURGERY     x 2   TONSILLECTOMY     VIDEO BRONCHOSCOPY WITH ENDOBRONCHIAL ULTRASOUND N/A 01/23/2013   Procedure: VIDEO BRONCHOSCOPY WITH ENDOBRONCHIAL ULTRASOUND;  Surgeon: Ivin Poot, MD;  [facility];  Service: Thoracic;  Laterality: N/A;   FAMILY HISTORY Family History  Problem Relation Age of Onset   Liver disease Mother        fatty liver   Diabetes Father    Heart disease Father        CABG at 82   Colon cancer Neg Hx    Stomach cancer Neg Hx    Pancreatic cancer Neg Hx    Esophageal cancer Neg Hx    SOCIAL HISTORY Social History   Tobacco Use   Smoking status: Never   Smokeless tobacco:  Never  Vaping Use   Vaping Use: Never used  Substance Use Topics   Alcohol use: No   Drug use: No       OPHTHALMIC EXAM:  Base Eye Exam     Visual Acuity (Snellen - Linear)       Right Left   Dist cc CF @ 1' 20/20 -2   Dist ph cc NI     Correction: Glasses         Tonometry (Tonopen, 9:05 AM)       Right Left   Pressure 12 17         Pupils       Dark Light Shape React APD   Right 4 3 Round Brisk None   Left 3 2 Round Brisk None         Visual Fields (Counting fingers)       Left Right    Full    Restrictions  Total superior temporal, inferior temporal, inferior nasal deficiencies         Extraocular Movement       Right Left    Full, Ortho Full, Ortho         Neuro/Psych     Oriented x3: Yes   Mood/Affect: Normal         Dilation     Both eyes: 1.0% Mydriacyl, 2.5% Phenylephrine @ 9:05 AM           Slit Lamp and Fundus Exam     Slit Lamp Exam       Right Left   Lids/Lashes Dermatochalasis - upper lid, mild Meibomian gland dysfunction Dermatochalasis - upper lid, mild Meibomian gland dysfunction   Conjunctiva/Sclera White and quiet White and quiet   Cornea Trace Punctate epithelial erosions Clear   Anterior Chamber Deep and quiet, no cell or flare Deep and quiet   Iris Round and dilated, No NVI Round and  dilated, No NVI   Lens 2+ Nuclear sclerosis, 2+ Cortical cataract 2+ Nuclear sclerosis, 2+ Cortical cataract   Anterior Vitreous Mild Vitreous syneresis, mild cell/pigment, Posterior vitreous detachment, vitreous condensations Mild Vitreous syneresis         Fundus Exam       Right Left   Disc 3+ Pallor, mild fibrosis, +PPA, +cupping Pink and Sharp   C/D Ratio 0.8 0.3   Macula Blunted foveal reflex, large central pigment clump, RPE mottling, clumping and atrophy, +edema, scattered exudate, +IRH inferiorly - improved, pre-retinal fibrosis temporal macula, scattered sub-retinal fibrosis Flat, Good foveal reflex, No heme or edema, RPE mottling   Vessels attenuation, +sheathing, Tortuous attenuation, mild Tortuousity   Periphery Attached, scattered pigment clumping, sclerotic vessels with sheathing, focal cluster of DBH at 0315 with surrounding circinate exudate - slightly improved, pigmented fibrotic scarring superiorly, +edema, good 360 PRP changes except for a few focal areas of edema temporally and nasally; Mild interval improvement in [facility] Attached; no heme or exudate             Refraction     Wearing Rx       Sphere Cylinder Axis   Right [place]     Left -1.00 +1.00 175            IMAGING AND PROCEDURES  Imaging and Procedures for $RemoveBefore'@TODAY'[id number]$ @  OCT, Retina - OU - Both Eyes       Right Eye Quality was good. Central Foveal Thickness: 253. Progression has been stable. Findings include abnormal foveal contour, epiretinal membrane, preretinal fibrosis, outer  retinal atrophy, subretinal hyper-reflective material, no SRF, intraretinal fluid, intraretinal hyper-reflective material (stable improvement in peripheral IRF/IRHM and edema).   Left Eye Quality was good. Central Foveal Thickness: 293. Progression has been stable. Findings include normal foveal contour, no IRF, no SRF, vitreomacular adhesion .   Notes *Images captured and stored on drive  Diagnosis / Impression:  OD:  diffuse atrophy and peripheral IRF 2/2 Coats Disease -- Interval improvement in peripheral IRF/IRHM and edema OS: NFP, no IRF/SRF  Clinical management:  See below  Abbreviations: NFP - Normal foveal profile. CME - cystoid macular edema. PED - pigment epithelial detachment. IRF - intraretinal fluid. SRF - subretinal fluid. EZ - ellipsoid zone. ERM - epiretinal membrane. ORA - outer retinal atrophy. ORT - outer retinal tubulation. SRHM - subretinal hyper-reflective material      Color Fundus Photography Optos - OU - Both Eyes       Right Eye Progression has improved. Disc findings include pallor. Macula : retinal pigment epithelium abnormalities, exudates. Vessels : attenuated (Coats disease with sheathing and perivascular exudation; interval improvement in exudation). Periphery : RPE abnormality, hemorrhage (Interval improvement in focal hemorrhages nasally).   Left Eye Progression has been stable. Disc findings include normal observations. Macula : normal observations. Vessels : normal observations. Periphery : normal observations.   Notes **Images stored on drive**  Impression: OD: Coats disease with good 360 PRP, interval improvement in exudates and IRH OS: normal study, stable             ASSESSMENT/PLAN:    ICD-10-CM   1. Coats' disease of right eye  H35.021 OCT, Retina - OU - Both Eyes    Color Fundus Photography Optos - OU - Both Eyes    2. Proliferative retinopathy, non-diabetic, right  H35.21 Color Fundus Photography Optos - OU - Both Eyes    3. Retinal neovascularization of right eye  H35.051     4. Iris neovascularization, right  H21.1X1     5. Neovascular glaucoma of right eye, severe stage  H40.51X3     6. Essential hypertension  I10     7. Hypertensive retinopathy of both eyes  H35.033     8. Diabetes mellitus type 2 without retinopathy ([place])  E11.9     9. Combined forms of age-related cataract of both eyes  H25.813       1. Coats Disease OD  -  Pt reports longstanding low vision and "inflammation" which resulted in low vision since early childhood  - mother reports early diagnosis of Coats Disease in pt  - denies eye pain  - exam shows extensive vascular sheathing and exudative disease -- improved from prior  - FA 1.24.20 shows severe peripheral vascular nonperfusion and leakage at border of perfusion/nonperfusion  - s/p PRP OD (01.20.21)  - FA 3.24.21 shows interval improvement in vascular leakage post PRP laser  - f/u 1 year, DFE, OCT, optos color/FAF  2-5. Proliferative retinopathy w/ retinal and iris neovascularization and Neovascular Glaucoma OD  - secondary to long standing retinal vasculitis / Coats disease  - IOP 12 today -- on Combigan QDaily per Dr. Ellin Mayhew -- pt states using drop about every other day  - s/p PRP OD as above  - exam shows stable regression of NVI and retinal NV  - discussed findings  - f/u in 1 year  6,7. Hypertensive retinopathy OU  - discussed importance of tight BP control  - monitor  8. Diabetes mellitus, type 2 without retinopathy  - The incidence,  risk factors for progression, natural history and treatment options for diabetic retinopathy  were discussed with patient.    - The need for close monitoring of blood glucose, blood pressure, and serum lipids, avoiding cigarette or any type of tobacco, and the need for long term follow up was also discussed with patient.  - monitor  9. Mixed form age related cataract OU  - The symptoms of cataract, surgical options, and treatments and risks were discussed with patient.  - discussed diagnosis and progression  - monitor for now  Ophthalmic Meds Ordered this visit:  No orders of the defined types were placed in this encounter.     Return in about 1 year (around 09/01/2022) for f/u Coats Disease OD, DFE, OCT.  There are no Patient Instructions on file for this visit.  Explained the diagnoses, plan, and follow up with the patient and they expressed  understanding.  Patient expressed understanding of the importance of proper follow up care.   This document serves as a record of services personally performed by Gardiner Sleeper, MD, PhD. It was created on their behalf by Estill Bakes, COT an ophthalmic technician. The creation of this record is the provider's dictation and/or activities during the visit.    Electronically signed by: Estill Bakes, COT 1.2.23 @ 1:00 PM   This document serves as a record of services personally performed by Gardiner Sleeper, MD, PhD. It was created on their behalf by San Jetty. Owens Shark, OA an ophthalmic technician. The creation of this record is the provider's dictation and/or activities during the visit.    Electronically signed by: San Jetty. Owens Shark, [place] 01.03.2023 1:00 PM   Gardiner Sleeper, M.D., Ph.D. Diseases & Surgery of the Retina and Vitreous [facility]  I have reviewed the above documentation for accuracy and completeness, and I agree with the above. Gardiner Sleeper, M.D., Ph.D. 09/01/21 1:01 PM  Abbreviations: M myopia (nearsighted); A astigmatism; H hyperopia (farsighted); P presbyopia; Mrx spectacle prescription;  CTL contact lenses; OD right eye; OS left eye; OU both eyes  XT exotropia; ET esotropia; PEK punctate epithelial keratitis; PEE punctate epithelial erosions; DES dry eye syndrome; MGD meibomian gland dysfunction; ATs artificial tears; PFAT's preservative free artificial tears; [place] nuclear sclerotic cataract; PSC posterior subcapsular cataract; ERM epi-retinal membrane; PVD posterior vitreous detachment; RD retinal detachment; DM diabetes mellitus; DR diabetic retinopathy; NPDR non-proliferative diabetic retinopathy; PDR proliferative diabetic retinopathy; CSME clinically significant macular edema; DME diabetic macular edema; dbh dot blot hemorrhages; CWS cotton wool spot; POAG primary open angle glaucoma; C/D cup-to-disc ratio; HVF humphrey visual field; GVF goldmann visual  field; OCT optical coherence tomography; IOP intraocular pressure; BRVO Branch retinal vein occlusion; CRVO central retinal vein occlusion; CRAO central retinal artery occlusion; BRAO branch retinal artery occlusion; RT retinal tear; SB scleral buckle; PPV pars plana vitrectomy; VH Vitreous hemorrhage; PRP panretinal laser photocoagulation; IVK intravitreal kenalog; VMT vitreomacular traction; MH Macular hole;  NVD neovascularization of the disc; NVE neovascularization elsewhere; AREDS age related eye disease study; ARMD age related macular degeneration; POAG primary open angle glaucoma; EBMD epithelial/anterior basement membrane dystrophy; ACIOL anterior chamber intraocular lens; IOL intraocular lens; PCIOL posterior chamber intraocular lens; Phaco/IOL phacoemulsification with intraocular lens placement; [place] photorefractive keratectomy; LASIK laser assisted in situ keratomileusis; HTN hypertension; DM diabetes mellitus; COPD chronic obstructive pulmonary disease

## 2021-09-01 ENCOUNTER — Ambulatory Visit (INDEPENDENT_AMBULATORY_CARE_PROVIDER_SITE_OTHER): Payer: 59 | Admitting: Ophthalmology

## 2021-09-01 ENCOUNTER — Other Ambulatory Visit: Payer: Self-pay

## 2021-09-01 ENCOUNTER — Encounter (INDEPENDENT_AMBULATORY_CARE_PROVIDER_SITE_OTHER): Payer: Self-pay | Admitting: Ophthalmology

## 2021-09-01 DIAGNOSIS — H211X1 Other vascular disorders of iris and ciliary body, right eye: Secondary | ICD-10-CM | POA: Diagnosis not present

## 2021-09-01 DIAGNOSIS — H35051 Retinal neovascularization, unspecified, right eye: Secondary | ICD-10-CM

## 2021-09-01 DIAGNOSIS — H3521 Other non-diabetic proliferative retinopathy, right eye: Secondary | ICD-10-CM | POA: Diagnosis not present

## 2021-09-01 DIAGNOSIS — H35021 Exudative retinopathy, right eye: Secondary | ICD-10-CM | POA: Diagnosis not present

## 2021-09-01 DIAGNOSIS — I10 Essential (primary) hypertension: Secondary | ICD-10-CM

## 2021-09-01 DIAGNOSIS — H35033 Hypertensive retinopathy, bilateral: Secondary | ICD-10-CM

## 2021-09-01 DIAGNOSIS — H4051X3 Glaucoma secondary to other eye disorders, right eye, severe stage: Secondary | ICD-10-CM

## 2021-09-01 DIAGNOSIS — E119 Type 2 diabetes mellitus without complications: Secondary | ICD-10-CM

## 2021-09-01 DIAGNOSIS — H25813 Combined forms of age-related cataract, bilateral: Secondary | ICD-10-CM

## 2021-09-18 ENCOUNTER — Encounter: Payer: Self-pay | Admitting: Internal Medicine

## 2021-09-22 NOTE — Telephone Encounter (Signed)
He can try Cipro 500 twice daily x10 days for SIBO

## 2021-09-22 NOTE — Telephone Encounter (Signed)
I am fine with a Rx, however my recollection his symptoms improved when he stopped metformin.  Can you determine which antibiotic we treated him with? Thank you

## 2021-09-22 NOTE — Telephone Encounter (Signed)
This note bounced back to me again.  Please see my rec regarding ABX Thanks JMP

## 2021-09-23 ENCOUNTER — Other Ambulatory Visit: Payer: Self-pay

## 2021-09-23 MED ORDER — CIPROFLOXACIN HCL 500 MG PO TABS: 500.0000 mg | ORAL_TABLET | Freq: Two times a day (BID) | ORAL | 0 refills | Status: DC

## 2021-11-16 ENCOUNTER — Other Ambulatory Visit: Payer: Self-pay

## 2021-11-16 ENCOUNTER — Encounter: Payer: Self-pay | Admitting: Internal Medicine

## 2021-11-16 DIAGNOSIS — R195 Other fecal abnormalities: Secondary | ICD-10-CM

## 2021-11-16 DIAGNOSIS — R143 Flatulence: Secondary | ICD-10-CM

## 2021-11-16 NOTE — Telephone Encounter (Signed)
Please look for a cancellation spot for Jorge Peterson ? ?In the interim, would have him do a SIBO breath test --recurrent issues ?Have him do a GI pathogen panel, fecal calprotectin and fecal elastase ?After these will likely need colonoscopy ? ?After he submits stool studies we can give him a prescription for Lomotil 1 tablet 4 times daily as needed until we either retreat for SIBO or obtain another diagnosis ? ? ?

## 2021-11-18 ENCOUNTER — Other Ambulatory Visit: Payer: 59

## 2021-11-18 ENCOUNTER — Telehealth: Payer: Self-pay | Admitting: Internal Medicine

## 2021-11-18 DIAGNOSIS — R143 Flatulence: Secondary | ICD-10-CM

## 2021-11-18 DIAGNOSIS — R195 Other fecal abnormalities: Secondary | ICD-10-CM

## 2021-11-18 NOTE — Telephone Encounter (Signed)
Inbound call from patient. States he made a mistake while taking the breath test and would like to know if it can be corrected. Patient did not tell me the mistake but would like a call back to discuss  ?

## 2021-11-18 NOTE — Telephone Encounter (Signed)
Spoke with pt and let him know he will have to call [facility] to have them answer his question. He verbalized understanding. ?

## 2021-11-22 ENCOUNTER — Encounter: Payer: Self-pay | Admitting: Internal Medicine

## 2021-11-22 LAB — GI PROFILE, STOOL, PCR

## 2021-11-22 LAB — CALPROTECTIN, FECAL: Calprotectin, Fecal: 31 ug/g (ref 0–120)

## 2021-11-23 ENCOUNTER — Other Ambulatory Visit: Payer: Self-pay

## 2021-11-23 MED ORDER — CIPROFLOXACIN HCL 500 MG PO TABS: 500.0000 mg | ORAL_TABLET | Freq: Two times a day (BID) | ORAL | 0 refills | Status: AC

## 2021-11-24 ENCOUNTER — Telehealth: Payer: Self-pay

## 2021-11-24 NOTE — Telephone Encounter (Signed)
Received call from [employer] with following alert: ? ?Yersinia enterocolitica  Detected Abnormal    ?Comment:                   Client Requested Flag  ?Appears Dr. Hilarie Fredrickson has addressed alert and has sent a response to his nurse. Efforts appear to be ongoing at this time to alert pt of Dr. Vena Rua recommendations. Refer to Labs for further correspondence and documentation. ?

## 2021-11-26 LAB — PANCREATIC ELASTASE, FECAL: Pancreatic Elastase-1, Stool: >500 mcg/g

## 2021-12-03 NOTE — Telephone Encounter (Signed)
No, I only sent in the abx for the infection.  Jorge Peterson may know something about it. ?

## 2021-12-22 ENCOUNTER — Encounter: Payer: Self-pay | Admitting: Internal Medicine

## 2021-12-23 NOTE — Telephone Encounter (Signed)
Please let Less know that we can add additional appropriate codes for the fecal calprotectin which may change its coverage. ?Insurance companies seem to be more and more problematic with covering tests even when they are medically appropriate, such as this 1. ?I would not do the SIBO breath test at this time, but he can hold onto the kit in the event symptoms return in the future. ?I am glad he is feeling better ? ?Vaughan Basta, please add the following code to the fecal calprotectin lab order and resubmit: ?K52.9 ? ?Thanks ?JMP ?

## 2022-01-27 ENCOUNTER — Other Ambulatory Visit: Payer: Self-pay | Admitting: Internal Medicine

## 2022-01-27 DIAGNOSIS — I480 Paroxysmal atrial fibrillation: Secondary | ICD-10-CM

## 2022-01-27 NOTE — Telephone Encounter (Signed)
Prescription refill request for Eliquis received. Indication: Afib  Last office visit: 06/10/21 ([facility])  Scr: 1.0 (10/14/21)  Age: 61 Weight: 99.8kg  Appropriate dose and refill sent to requested pharmacy.

## 2022-03-06 ENCOUNTER — Other Ambulatory Visit: Payer: Self-pay | Admitting: Internal Medicine

## 2022-03-08 ENCOUNTER — Other Ambulatory Visit: Payer: Self-pay

## 2022-03-08 ENCOUNTER — Encounter: Payer: Self-pay | Admitting: Internal Medicine

## 2022-03-08 MED ORDER — CIPROFLOXACIN HCL 500 MG PO TABS: 500.0000 mg | ORAL_TABLET | Freq: Two times a day (BID) | ORAL | 0 refills | Status: DC

## 2022-03-08 MED ORDER — ROSUVASTATIN CALCIUM 10 MG PO TABS: 10.0000 mg | ORAL_TABLET | Freq: Every day | ORAL | 0 refills | Status: DC

## 2022-03-08 NOTE — Telephone Encounter (Signed)
Cipro 500 mg BID x 7 days Hx of yersinia enteritis, recurrent symptoms, possible SIBO JMP

## 2022-03-26 ENCOUNTER — Other Ambulatory Visit: Payer: Self-pay | Admitting: Internal Medicine

## 2022-03-28 ENCOUNTER — Other Ambulatory Visit: Payer: Self-pay | Admitting: Internal Medicine

## 2022-04-28 ENCOUNTER — Other Ambulatory Visit
Admission: RE | Admit: 2022-04-28 | Discharge: 2022-04-28 | Disposition: A | Discharge status: Home / Self Care | Payer: 59 | Source: Ambulatory Visit | Attending: Specialist | Admitting: Specialist

## 2022-04-28 ENCOUNTER — Ambulatory Visit
Admission: RE | Admit: 2022-04-28 | Discharge: 2022-04-28 | Disposition: A | Discharge status: Home / Self Care | Payer: 59 | Source: Ambulatory Visit | Attending: Specialist | Admitting: Specialist

## 2022-04-28 ENCOUNTER — Other Ambulatory Visit: Payer: Self-pay | Admitting: Specialist

## 2022-04-28 ENCOUNTER — Encounter: Payer: Self-pay | Admitting: Internal Medicine

## 2022-04-28 DIAGNOSIS — R058 Other specified cough: Secondary | ICD-10-CM | POA: Insufficient documentation

## 2022-04-28 DIAGNOSIS — R7989 Other specified abnormal findings of blood chemistry: Secondary | ICD-10-CM | POA: Insufficient documentation

## 2022-04-28 DIAGNOSIS — R071 Chest pain on breathing: Secondary | ICD-10-CM | POA: Insufficient documentation

## 2022-04-28 LAB — D-DIMER, QUANTITATIVE: D-Dimer, Quant: 1.19 ug/mL-FEU — ABNORMAL HIGH (ref 0.00–0.50)

## 2022-04-28 MED ORDER — IOHEXOL 350 MG/ML SOLN
75.0000 mL | Freq: Once | INTRAVENOUS | Status: AC | PRN
  Administered 2022-04-28: 75 mL via INTRAVENOUS

## 2022-05-06 NOTE — Progress Notes (Signed)
Cardiology Office Note Date:  05/06/2022  Patient ID:  Jorge Peterson, DOB 02-21-1961, MRN [id number] PCP:  [name], MD  Electrophysiologist: Dr. Rayann Heman    Chief Complaint:  annual visit, new symptoms  History of Present Illness: Jorge Peterson is a 61 y.o. male with history of HTN, HLD, DM, GERD, AFib, asymptomatic bradycardia.  Has hx of recurrent RSV infections over the last couple years Remotely had a lung nodule initially thought to be Cancer by PET scan  but biopsy negative   He was last seen by Dr. Rayann Heman 06/10/21, he was doing well, his simvastatin change to Crestor at pt request with recommendations to f/u with his PMD lipids.   TODAY He is accompanied by his wife. He reports that for about 3 weeks or so he has felt unusually fatigued, typically feels like he has a lot of energy, always on the go, but lately more tired, less energy.  He has some chronic pulmonary hx so seems to at baseline keep track of his O2 sat and noted that his sats have been 91 prestty consistently. He reached out to his pulmonologist who did a Ddimer was high that ledt to a CT that was negative for PE or any other particular findings and was recommended that he see his cardiologist to further evaluate his symptoms.  Unusual fatigue, and low O2 sats Not particularly SOB  2. CP, an ache, central chest, non-radiating, random  Not positional or exertional Lasts seconds to hours No associated symptoms With hours duration not changed by anything including exertion. He has continued exercising and this does not provoke. His father had CABG in his 3's-60 y/o  3. Once when getting out of the car he felt very lightheaded, near faint. Not recurrent  He exercises regularly, elliptical, weights, is very active. He has continued to exercise but not as much or as strenuous as usual, just doesn't have the energy No palpitations or awareness of any Afib  Device information: MDT ILR, implanted  06/27/15  > removed 11/224/2020  AFib/AAD hx PVI ablation 2012 Flecainide started 2012> PRN > stopped (unclear when) PVI ablation, 07/24/2019  Past Medical History:  Diagnosis Date   Atrial fibrillation [facility])    s/p afib ablation 7/12 and 11/20   Bradycardia    asymptomatic   Cataract    Mixed OU   Colonic diverticular abscess    Diabetes ([place])    Diverticulitis    Elevated LFTs    Fall    Family history of anesthesia complication    father gets pneumonia after "   GERD (gastroesophageal reflux disease)    Hyperlipidemia    Hyperplastic colon polyp    Hypertension    Hypertensive retinopathy    OU   Internal hemorrhoids    Tinnitus    Tubular adenoma of colon    Wears glasses     Past Surgical History:  Procedure Laterality Date   ATRIAL ABLATION SURGERY  03/24/11   PVI by [place] N/A 07/24/2019   Procedure: [place];  Surgeon: [name], MD;  Location: [place] [facility];  Service: Cardiovascular;  Laterality: N/A;   BACK SURGERY     BRONCHOSCOPY  01/23/2013    biopsy   CHOLECYSTECTOMY  05/02/2014   LAP CHOLE   CHOLECYSTECTOMY N/A 05/02/2014   Procedure: LAPAROSCOPIC CHOLECYSTECTOMY WITH INTRAOPERATIVE CHOLANGIOGRAM;  Surgeon: Erroll Luna, MD;  Location: [place];  Service: General;  Laterality: N/A;   EP IMPLANTABLE  DEVICE N/A 06/27/2015   Procedure: Loop Recorder Insertion;  Surgeon: [name], MD;  Location: [place] [facility];  Service: Cardiovascular;  Laterality: N/A;   ERCP N/A 05/25/2014   Procedure: ENDOSCOPIC RETROGRADE CHOLANGIOPANCREATOGRAPHY (ERCP);  Surgeon: [name], MD;  Location: [place];  Service: Endoscopy;  Laterality: N/A;   Fistula surgery     LOOP RECORDER REMOVAL N/A 07/24/2019   Procedure: LOOP RECORDER REMOVAL;  Surgeon: [name], MD;  Location: [place] [facility];  Service: Cardiovascular;  Laterality: N/A;   MEDIASTINOSCOPY N/A 01/23/2013   Procedure: MEDIASTINOSCOPY;   Surgeon: Ivin Poot, MD;  Location: [place];  Service: Thoracic;  Laterality: N/A;   QUADRICEPS TENDON REPAIR Right 01/13/2016   Procedure: REPAIR QUADRICEP TENDON;  Surgeon: Latanya Maudlin, MD;  Location: [facility];  Service: Orthopedics;  Laterality: Right;  Received femoral nerve block in holding   SHOULDER SURGERY     x 2   TONSILLECTOMY     VIDEO BRONCHOSCOPY WITH ENDOBRONCHIAL ULTRASOUND N/A 01/23/2013   Procedure: VIDEO BRONCHOSCOPY WITH ENDOBRONCHIAL ULTRASOUND;  Surgeon: Ivin Poot, MD;  Location: [facility];  Service: Thoracic;  Laterality: N/A;    Current Outpatient Medications  Medication Sig Dispense Refill   acetaminophen (TYLENOL) 500 MG tablet Take 2 tablets (1,000 mg total) by mouth every 6 (six) hours as needed. 30 tablet 0   albuterol (VENTOLIN HFA) 108 (90 Base) MCG/ACT inhaler INHALE 2 PUFFS BY MOUTH EVERY 6 HOURS AS NEEDED FOR WHEEZE     Biotin 5000 MCG TABS Take 800 mcg by mouth daily.      brimonidine-timolol (COMBIGAN) 0.2-0.5 % ophthalmic solution      budesonide-formoterol (SYMBICORT) 160-4.5 MCG/ACT inhaler Inhale into the lungs.     ciprofloxacin (CIPRO) 500 MG tablet Take 1 tablet (500 mg total) by mouth 2 (two) times daily. 20 tablet 0   ciprofloxacin (CIPRO) 500 MG tablet Take 1 tablet (500 mg total) by mouth 2 (two) times daily. 14 tablet 0   Coenzyme Q10 (COQ10 PO) Take 120 mg by mouth every evening.     dicyclomine (BENTYL) 20 MG tablet Take 1 tablet (20 mg total) by mouth 3 (three) times daily as needed for spasms (cramping abdominal discomfort/IBS). 90 tablet 1   Dulaglutide (TRULICITY) 4.78 GN/5.6OZ SOPN Inject 0.75 mg into the skin once a week.     ELIQUIS 5 MG TABS tablet TAKE 1 TABLET(5 MG) BY MOUTH TWICE DAILY 60 tablet 5   fluticasone (FLONASE) 50 MCG/ACT nasal spray Place 1 spray into the nose daily as needed for allergies.      folic acid (FOLVITE) 308 MCG tablet Take 400 mcg by mouth daily.     glipiZIDE (GLUCOTROL XL) 5 MG 24 hr tablet Take 5 mg  by mouth daily.     hydrochlorothiazide (HYDRODIURIL) 12.5 MG tablet Take 12.5 mg by mouth daily.  3   losartan (COZAAR) 100 MG tablet Take 100 mg by mouth every evening.   3   metFORMIN (GLUCOPHAGE-XR) 750 MG 24 hr tablet Take 1,000 mg by mouth daily.     Multiple Vitamin (MULTIVITAMIN WITH MINERALS) TABS tablet Take 1 tablet by mouth daily. Centrum Silver     mupirocin ointment (BACTROBAN) 2 % Apply 1 application topically daily. Qd to excision site 22 g 1   Omega-3 Fatty Acids (FISH OIL PO) Take 1 capsule by mouth 2 (two) times daily.     omeprazole (PRILOSEC) 40 MG capsule TAKE 1 CAPSULE BY MOUTH EVERY DAY 90 capsule  0   rosuvastatin (CRESTOR) 10 MG tablet Take 1 tablet (10 mg total) by mouth daily. 90 tablet 0   No current facility-administered medications for this visit.    Allergies:   Codeine, Latex, and Oxycodone-acetaminophen   Social History:  The patient  reports that he has never smoked. He has never used smokeless tobacco. He reports that he does not drink alcohol and does not use drugs.   Family History:  The patient's family history includes Diabetes in his father; Heart disease in his father; Liver disease in his mother.  ROS:  Please see the history of present illness.  All other systems are reviewed and otherwise negative.   PHYSICAL EXAM: VS:  There were no vitals taken for this visit. BMI: There is no height or weight on file to calculate BMI. Well nourished, well developed, in no acute distress  HEENT: normocephalic, atraumatic  Neck: no JVD, carotid bruits or masses Cardiac:  RRR; bradycardic, no significant murmurs, no rubs, or gallops Lungs:  CTA b/l, no wheezing, rhonchi or rales  Abd: not examined MS: no deformity or atrophy Ext: no edema  Skin: warm and dry, no rash Neuro:  No gross deficits appreciated Psych: euthymic mood, full affect   EKG:  Done today and reviewed by myself:  SR 61bpm, no acute changes   07/24/2019:  EPS/ablation CONCLUSIONS: 1. Sinus rhythm upon presentation.   2. Intracardiac echo reveals a moderate sized left atrium with four separate pulmonary veins without evidence of pulmonary vein stenosis. 3. Return of electrical activity within the left superior and right superior pulmonary veins from a prior ablation.  The left inferior and right inferior pulmonary veins were quiescent from the prior ablation procedure.  4. As the prior ablation was quite antral, I elected to perform WACA of all four PVs today.  5. No inducible arrhythmias following ablation both on and off of Isuprel 6. Successful removal of a medtronic reveal LINQ for EOL battery status 7. No early apparent complications.  07/02/15: TTE Study Conclusions - Left ventricle: The cavity size was normal. Wall thickness was   normal. Systolic function was normal. The estimated ejection   fraction was in the range of 55% to 60%. Wall motion was normal;   there were no regional wall motion abnormalities. Doppler   parameters are consistent with abnormal left ventricular   relaxation (grade 1 diastolic dysfunction). Impressions: - Normal LV systolic function; grade 1 diastolic dysfunction;   trace TR.  Recent Labs: 05/07/2021: BUN 17; Creatinine, Ser 1.07  No results found for requested labs within last 365 days.   CrCl cannot be calculated (Patient's most recent lab result is older than the maximum 21 days allowed.).   Wt Readings from Last 3 Encounters:  07/09/21 220 lb (99.8 kg)  06/10/21 215 lb 9.6 oz (97.8 kg)  05/07/21 215 lb 2 oz (97.6 kg)     Other studies reviewed: Additional studies/records reviewed today include: summarized above  ASSESSMENT AND PLAN:  1. Paroxysmal AFib     CHA2DS2Vasc is 2, on Eliquis, appropriately dosed      Low/no burden by symptoms       2. HTN     looks OK, no changes  3. Bradycardia     Asymptomatic historically  4. Symptoms as above All are somewhat vague CT chest recently  with normal sized heart, no effusion, no other cardiac/vascular discussion Echo Monitor  Coronary CT, particularly given his family hx labs  Discussed need for new MD,  will plan to establish his care with Dr. Myles Gip.        Disposition: back in 6 weeks or so, sooner if needed, pending results   Current medicines are reviewed at length with the patient today.  The patient did not have any concerns regarding medicines.  Venetia Night, PA-C 05/06/2022 5:34 PM     [address] [place] [id number] [phone] (office)  [phone] (fax)

## 2022-05-07 ENCOUNTER — Encounter: Payer: Self-pay | Admitting: Physician Assistant

## 2022-05-07 ENCOUNTER — Ambulatory Visit: Payer: 59 | Attending: Physician Assistant | Admitting: Physician Assistant

## 2022-05-07 VITALS — BP 124/84 | HR 61 | Ht 67.0 in | Wt 217.0 lb

## 2022-05-07 DIAGNOSIS — R079 Chest pain, unspecified: Secondary | ICD-10-CM

## 2022-05-07 DIAGNOSIS — R42 Dizziness and giddiness: Secondary | ICD-10-CM

## 2022-05-07 DIAGNOSIS — R0602 Shortness of breath: Secondary | ICD-10-CM | POA: Diagnosis not present

## 2022-05-07 DIAGNOSIS — I10 Essential (primary) hypertension: Secondary | ICD-10-CM

## 2022-05-07 DIAGNOSIS — I480 Paroxysmal atrial fibrillation: Secondary | ICD-10-CM | POA: Diagnosis not present

## 2022-05-07 NOTE — Patient Instructions (Signed)
Medication Instructions:   Your physician recommends that you continue on your current medications as directed. Please refer to the Current Medication list given to you today.   *If you need a refill on your cardiac medications before your next appointment, please call your pharmacy*   Lab Work:  BMET AND CBC TODAY    If you have labs (blood work) drawn today and your tests are completely normal, you will receive your results only by: [place] (if you have MyChart) OR A paper copy in the mail If you have any lab test that is abnormal or we need to change your treatment, we will call you to review the results.   Testing/Procedures: Non-Cardiac CT Angiography (CTA), is a special type of CT scan that uses a computer to produce multi-dimensional views of major blood vessels throughout the body. In CT angiography, a contrast material is injected through an IV to help visualize the blood vessels   Your physician has requested that you have an echocardiogram. Echocardiography is a painless test that uses sound waves to create images of your heart. It provides your doctor with information about the size and shape of your heart and how well your heart's chambers and valves are working. This procedure takes approximately one hour. There are no restrictions for this procedure.   [place]  Your physician has recommended that you wear an event monitor. Event monitors are medical devices that record the heart's electrical activity. Doctors most often [place] these monitors to diagnose arrhythmias. Arrhythmias are problems with the speed or rhythm of the heartbeat. The monitor is a small, portable device. You can wear one while you do your normal daily activities. This is usually used to diagnose what is causing palpitations/syncope (passing out).   Follow-Up: At [facility], you and your health needs are our priority.  As part of our continuing mission to provide you with  exceptional heart care, we have created designated Provider Care Teams.  These Care Teams include your primary Cardiologist (physician) and Advanced Practice Providers (APPs -  Physician Assistants and Nurse Practitioners) who all work together to provide you with the care you need, when you need it.  We recommend signing up for the patient portal called "MyChart".  Sign up information is provided on this After Visit Summary.  MyChart is used to connect with patients for Virtual Visits (Telemedicine).  Patients are able to view lab/test results, encounter notes, upcoming appointments, etc.  Non-urgent messages can be sent to your provider as well.   To learn more about what you can do with MyChart, go to [email or web address].    Your next appointment:   6 week(s)  The format for your next appointment:   In Person  Provider:   Doralee Albino, MD   Other Instructions  Bryn Gulling- Long Term Monitor Instructions  Your physician has requested you wear a ZIO patch monitor for  7 days.  This is a single patch monitor. [employer] supplies one patch monitor per enrollment. Additional stickers are not available. Please do not apply patch if you will be having a Nuclear Stress Test,  Echocardiogram, Cardiac CT, MRI, or Chest Xray during the period you would be wearing the  monitor. The patch cannot be worn during these tests. You cannot remove and re-apply the  ZIO XT patch monitor.  Your ZIO patch monitor will be mailed 3 day USPS to your address on file. It may take 3-5 days  to receive your monitor  after you have been enrolled.  Once you have received your monitor, please review the enclosed instructions. Your monitor  has already been registered assigning a specific monitor serial # to you.  Billing and Patient Assistance Program Information  We have supplied [employer] with any of your insurance information on file for billing purposes. [employer] offers a sliding scale Patient Assistance Program  for patients that do not have  insurance, or whose insurance does not completely cover the cost of the ZIO monitor.  You must apply for the Patient Assistance Program to qualify for this discounted rate.  To apply, please call [employer] at [phone], select option 4, select option 2, ask to apply for  Patient Assistance Program. Theodore Demark will ask your household income, and how many people  are in your household. They will quote your out-of-pocket cost based on that information.  [employer] will also be able to set up a 71-month interest-free payment plan if needed.  Applying the monitor   Shave hair from upper left chest.  Hold abrader disc by orange tab. Rub abrader in 40 strokes over the upper left chest as  indicated in your monitor instructions.  Clean area with 4 enclosed alcohol pads. Let dry.  Apply patch as indicated in monitor instructions. Patch will be placed under collarbone on left  side of chest with arrow pointing upward.  Rub patch adhesive wings for 2 minutes. Remove white label marked "1". Remove the white  label marked "2". Rub patch adhesive wings for 2 additional minutes.  While looking in a mirror, press and release button in center of patch. A small green light will  flash 3-4 times. This will be your only indicator that the monitor has been turned on.  Do not shower for the first 24 hours. You may shower after the first 24 hours.  Press the button if you feel a symptom. You will hear a small click. Record Date, Time and  Symptom in the Patient Logbook.  When you are ready to remove the patch, follow instructions on the last 2 pages of Patient  Logbook. Stick patch monitor onto the last page of Patient Logbook.  Place Patient Logbook in the blue and white box. Use locking tab on box and tape box closed  securely. The blue and white box has prepaid postage on it. Please place it in the mailbox as  soon as possible. Your physician should have your test results  approximately 7 days after the  monitor has been [facility]  Call [place] [phone] you have questions regarding  your ZIO XT patch monitor. Call them immediately if you see an orange light blinking on your  monitor.  If your monitor falls off in less than 4 days, contact our Monitor department at [phone]  If your monitor becomes loose or falls off after 4 days call [employer] at [phone]  suggestions on securing your monitor  Important Information About [place]

## 2022-05-08 LAB — CBC
Hematocrit: 46.3 % (ref 37.5–51.0)
Hemoglobin: 14.8 g/dL (ref 13.0–17.7)
MCH: 27.1 pg (ref 26.6–33.0)
MCHC: 32 g/dL (ref 31.5–35.7)
MCV: 85 fL (ref 79–97)
Platelets: 210 10*3/uL (ref 150–450)
RBC: 5.46 x10E6/uL (ref 4.14–5.80)
RDW: 12.9 % (ref 11.6–15.4)
WBC: 7.8 10*3/uL (ref 3.4–10.8)

## 2022-05-08 LAB — BASIC METABOLIC PANEL
BUN/Creatinine Ratio: 13 (ref 10–24)
BUN: 13 mg/dL (ref 8–27)
CO2: 28 mmol/L (ref 20–29)
Calcium: 9.8 mg/dL (ref 8.6–10.2)
Chloride: 100 mmol/L (ref 96–106)
Creatinine, Ser: 0.99 mg/dL (ref 0.76–1.27)
Glucose: 120 mg/dL — ABNORMAL HIGH (ref 70–99)
Potassium: 4.3 mmol/L (ref 3.5–5.2)
Sodium: 143 mmol/L (ref 134–144)
eGFR: 86 mL/min/{1.73_m2} (ref >59)

## 2022-05-10 ENCOUNTER — Ambulatory Visit: Payer: 59 | Attending: Physician Assistant

## 2022-05-10 DIAGNOSIS — I480 Paroxysmal atrial fibrillation: Secondary | ICD-10-CM

## 2022-05-10 DIAGNOSIS — R079 Chest pain, unspecified: Secondary | ICD-10-CM

## 2022-05-10 DIAGNOSIS — R42 Dizziness and giddiness: Secondary | ICD-10-CM

## 2022-05-10 NOTE — Progress Notes (Unsigned)
Enrolled for [employer] to mail a ZIO XT long term holter monitor to the patients address on file.   Dr. Rayann Heman to read.

## 2022-05-13 ENCOUNTER — Ambulatory Visit (HOSPITAL_COMMUNITY)
Admission: RE | Admit: 2022-05-13 | Discharge: 2022-05-13 | Disposition: A | Discharge status: Home / Self Care | Payer: 59 | Source: Ambulatory Visit | Attending: Physician Assistant | Admitting: Physician Assistant

## 2022-05-13 DIAGNOSIS — E785 Hyperlipidemia, unspecified: Secondary | ICD-10-CM | POA: Insufficient documentation

## 2022-05-13 DIAGNOSIS — R0600 Dyspnea, unspecified: Secondary | ICD-10-CM | POA: Diagnosis not present

## 2022-05-13 DIAGNOSIS — R42 Dizziness and giddiness: Secondary | ICD-10-CM | POA: Insufficient documentation

## 2022-05-13 DIAGNOSIS — I10 Essential (primary) hypertension: Secondary | ICD-10-CM | POA: Insufficient documentation

## 2022-05-13 DIAGNOSIS — R001 Bradycardia, unspecified: Secondary | ICD-10-CM | POA: Insufficient documentation

## 2022-05-13 DIAGNOSIS — R079 Chest pain, unspecified: Secondary | ICD-10-CM | POA: Insufficient documentation

## 2022-05-13 DIAGNOSIS — R0602 Shortness of breath: Secondary | ICD-10-CM | POA: Diagnosis not present

## 2022-05-13 DIAGNOSIS — E119 Type 2 diabetes mellitus without complications: Secondary | ICD-10-CM | POA: Diagnosis not present

## 2022-05-13 DIAGNOSIS — Z8249 Family history of ischemic heart disease and other diseases of the circulatory system: Secondary | ICD-10-CM | POA: Diagnosis not present

## 2022-05-13 DIAGNOSIS — I4891 Unspecified atrial fibrillation: Secondary | ICD-10-CM | POA: Insufficient documentation

## 2022-05-13 LAB — ECHOCARDIOGRAM COMPLETE
Area-P 1/2: 3.5 cm2
S' Lateral: 3.05 cm

## 2022-05-24 ENCOUNTER — Telehealth (HOSPITAL_COMMUNITY): Payer: Self-pay | Admitting: *Deleted

## 2022-05-24 NOTE — Telephone Encounter (Signed)
Reaching out to patient to offer assistance regarding upcoming cardiac imaging study; pt verbalizes understanding of appt date/time, parking situation and where to check in, and verified current allergies; name and call back number provided for further questions should they arise  Gordy Clement RN Navigator [facility] [phone] office [phone] cell  Patient to arrive at 3pm.

## 2022-05-24 NOTE — Telephone Encounter (Signed)
Attempted to call patient regarding upcoming cardiac CT appointment. °Left message on voicemail with name and callback number ° °Allora Bains RN Navigator Cardiac Imaging °[facility] °[phone] Office °[phone] Cell ° °

## 2022-05-25 ENCOUNTER — Ambulatory Visit (HOSPITAL_COMMUNITY)
Admission: RE | Admit: 2022-05-25 | Discharge: 2022-05-25 | Disposition: A | Discharge status: Home / Self Care | Payer: 59 | Source: Ambulatory Visit | Attending: Physician Assistant | Admitting: Physician Assistant

## 2022-05-25 DIAGNOSIS — R079 Chest pain, unspecified: Secondary | ICD-10-CM | POA: Insufficient documentation

## 2022-05-25 MED ORDER — METOPROLOL TARTRATE 5 MG/5ML IV SOLN: 5.0000 mg | Freq: Once | INTRAVENOUS | Status: AC

## 2022-05-25 MED ORDER — METOPROLOL TARTRATE 5 MG/5ML IV SOLN
INTRAVENOUS | Status: AC
  Administered 2022-05-25: 5 mg via INTRAVENOUS
  Filled 2022-05-25: qty 5

## 2022-05-25 MED ORDER — NITROGLYCERIN 0.4 MG SL SUBL
SUBLINGUAL_TABLET | SUBLINGUAL | Status: AC
  Filled 2022-05-25: qty 2

## 2022-05-25 MED ORDER — NITROGLYCERIN 0.4 MG SL SUBL
0.8000 mg | SUBLINGUAL_TABLET | Freq: Once | SUBLINGUAL | Status: AC
Start: 2022-05-25 — End: 2022-05-25
  Administered 2022-05-25: 0.8 mg via SUBLINGUAL

## 2022-05-25 MED ORDER — IOHEXOL 350 MG/ML SOLN
100.0000 mL | Freq: Once | INTRAVENOUS | Status: AC | PRN
  Administered 2022-05-25: 100 mL via INTRAVENOUS

## 2022-05-26 DIAGNOSIS — I480 Paroxysmal atrial fibrillation: Secondary | ICD-10-CM | POA: Diagnosis not present

## 2022-05-26 DIAGNOSIS — R42 Dizziness and giddiness: Secondary | ICD-10-CM | POA: Diagnosis not present

## 2022-05-26 DIAGNOSIS — R079 Chest pain, unspecified: Secondary | ICD-10-CM

## 2022-06-01 ENCOUNTER — Other Ambulatory Visit: Payer: Self-pay | Admitting: *Deleted

## 2022-06-01 DIAGNOSIS — I7789 Other specified disorders of arteries and arterioles: Secondary | ICD-10-CM

## 2022-06-16 ENCOUNTER — Other Ambulatory Visit: Payer: Self-pay | Admitting: Internal Medicine

## 2022-06-21 NOTE — Progress Notes (Signed)
Cardiology Office Note:    Date:  06/22/2022   ID:  Jorge Peterson, DOB April 08, 1961, MRN [id number]  PCP:  [name], [place] Providers Cardiologist:  None Electrophysiologist:  Melida Quitter, MD     Referring MD: [name], MD   Chief complaint: shortness of breath   History of Present Illness:    Jorge Peterson is a 61 y.o. male with a hx of HTN, HLD, DM, GERD, atrial fibrillation, and asymptomatic bradycardia. He is a former patient of Dr. Rayann Heman.  He has had 2 A-fib ablations by Dr. Rayann Heman 1 in 2012, and a second 2020.  His symptoms were fairly insidious -- shortness of breath and cough. It seems that he wasn't very aware of his AF until he had become more persistent, but at that time, he had developed mild CHF symptoms.  He was seen in EP clinic last month and noticed new onset fatigue and low O2 sats at home. A CT was performed that did not show PE or other obvious cause of his shortness of breath. TTE showed dilation of his aortic root to 4 cm. Coronary Ct showed mild obstructive disease. Heart monitor showed nocturnal bradycardia but no AF.    He reports he has been doing a little better recently. O2 sats at home range between 94-97% on room air.   Past Medical History:  Diagnosis Date   Atrial fibrillation [facility])    s/p afib ablation 7/12 and 11/20   Bradycardia    asymptomatic   Cataract    Mixed OU   Colonic diverticular abscess    Diabetes ([place])    Diverticulitis    Elevated LFTs    Fall    Family history of anesthesia complication    father gets pneumonia after "   GERD (gastroesophageal reflux disease)    Hyperlipidemia    Hyperplastic colon polyp    Hypertension    Hypertensive retinopathy    OU   Internal hemorrhoids    Tinnitus    Tubular adenoma of colon    Wears glasses     Past Surgical History:  Procedure Laterality Date   ATRIAL ABLATION SURGERY  03/24/11   PVI by [place] N/A  07/24/2019   Procedure: [place];  Surgeon: [name], MD;  Location: [place] [facility];  Service: Cardiovascular;  Laterality: N/A;   BACK SURGERY     BRONCHOSCOPY  01/23/2013    biopsy   CHOLECYSTECTOMY  05/02/2014   LAP CHOLE   CHOLECYSTECTOMY N/A 05/02/2014   Procedure: LAPAROSCOPIC CHOLECYSTECTOMY WITH INTRAOPERATIVE CHOLANGIOGRAM;  Surgeon: Erroll Luna, MD;  Location: [place];  Service: General;  Laterality: N/A;   EP IMPLANTABLE DEVICE N/A 06/27/2015   Procedure: Loop Recorder Insertion;  Surgeon: [name], MD;  Location: [place] [facility];  Service: Cardiovascular;  Laterality: N/A;   ERCP N/A 05/25/2014   Procedure: ENDOSCOPIC RETROGRADE CHOLANGIOPANCREATOGRAPHY (ERCP);  Surgeon: [name], MD;  Location: [place];  Service: Endoscopy;  Laterality: N/A;   Fistula surgery     LOOP RECORDER REMOVAL N/A 07/24/2019   Procedure: LOOP RECORDER REMOVAL;  Surgeon: [name], MD;  Location: [place] [facility];  Service: Cardiovascular;  Laterality: N/A;   MEDIASTINOSCOPY N/A 01/23/2013   Procedure: MEDIASTINOSCOPY;  Surgeon: Ivin Poot, MD;  Location: [place];  Service: Thoracic;  Laterality: N/A;   QUADRICEPS TENDON REPAIR Right 01/13/2016   Procedure: REPAIR QUADRICEP TENDON;  Surgeon:  Latanya Maudlin, MD;  Location: [facility];  Service: Orthopedics;  Laterality: Right;  Received femoral nerve block in holding   SHOULDER SURGERY     x 2   TONSILLECTOMY     VIDEO BRONCHOSCOPY WITH ENDOBRONCHIAL ULTRASOUND N/A 01/23/2013   Procedure: VIDEO BRONCHOSCOPY WITH ENDOBRONCHIAL ULTRASOUND;  Surgeon: Ivin Poot, MD;  Location: [facility];  Service: Thoracic;  Laterality: N/A;    Current Medications: Current Meds  Medication Sig   acetaminophen (TYLENOL) 500 MG tablet Take 2 tablets (1,000 mg total) by mouth every 6 (six) hours as needed.   albuterol (VENTOLIN HFA) 108 (90 Base) MCG/ACT inhaler INHALE 2 PUFFS BY MOUTH EVERY 6 HOURS AS NEEDED FOR WHEEZE    Biotin 5000 MCG TABS Take 800 mcg by mouth daily.    brimonidine-timolol (COMBIGAN) 0.2-0.5 % ophthalmic solution    budesonide-formoterol (SYMBICORT) 160-4.5 MCG/ACT inhaler Inhale into the lungs.   ELIQUIS 5 MG TABS tablet TAKE 1 TABLET(5 MG) BY MOUTH TWICE DAILY   fluticasone (FLONASE) 50 MCG/ACT nasal spray Place 1 spray into the nose daily as needed for allergies.    folic acid (FOLVITE) 789 MCG tablet Take 400 mcg by mouth daily.   glipiZIDE (GLUCOTROL XL) 5 MG 24 hr tablet Take 5 mg by mouth daily.   hydrochlorothiazide (HYDRODIURIL) 12.5 MG tablet Take 12.5 mg by mouth daily.   losartan (COZAAR) 100 MG tablet Take 100 mg by mouth every evening.    Multiple Vitamin (MULTIVITAMIN WITH MINERALS) TABS tablet Take 1 tablet by mouth daily. Centrum Silver   Omega-3 Fatty Acids (FISH OIL PO) Take 1 capsule by mouth 2 (two) times daily.   omeprazole (PRILOSEC) 40 MG capsule TAKE 1 CAPSULE BY MOUTH EVERY DAY   rosuvastatin (CRESTOR) 10 MG tablet TAKE 1 TABLET(10 MG) BY MOUTH DAILY   TRULICITY 1.5 FY/1.0FB SOPN Inject 1.5 mg into the skin once a week.     Allergies:   Codeine, Latex, and Oxycodone-acetaminophen   Social History   Socioeconomic History   Marital status: Married    Spouse name: Rodena Piety   Number of children: 3   Years of education: 16   Highest education level: Bachelor's degree (e.g., BA, AB, BS)  Occupational History   Occupation: Minister/ Assoc. Theme park manager    Employer: [employer]  Tobacco Use   Smoking status: Never   Smokeless tobacco: Never  Vaping Use   Vaping Use: Never used  Substance and Sexual Activity   Alcohol use: No   Drug use: No   Sexual activity: Yes  Other Topics Concern   Not on file  Social History Narrative   Lives in [place].  3 children are all healthy.   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   No siblings with CAD.   Social Determinants of Health   Financial Resource Strain: Low Risk  (05/11/2018)   Overall  Financial Resource Strain (CARDIA)    Difficulty of Paying Living Expenses: Not hard at all  Food Insecurity: No Food Insecurity (05/11/2018)   Hunger Vital Sign    Worried About Running Out of Food in the Last Year: Never true    Ran Out of Food in the Last Year: Never true  Transportation Needs: No Transportation Needs (05/11/2018)   PRAPARE - Hydrologist (Medical): No    Lack of Transportation (Non-Medical): No  Physical Activity: Sufficiently Active (05/11/2018)   Exercise Vital Sign    Days of Exercise per Week: 4 days  Minutes of Exercise per Session: 120 min  Stress: No Stress Concern Present (05/11/2018)   [place]    Feeling of Stress : Not at all  Social Connections: Moderately Integrated (05/11/2018)   Social Connection and Isolation Panel [NHANES]    Frequency of Communication with Friends and Family: More than three times a week    Frequency of Social Gatherings with Friends and Family: More than three times a week    Attends Religious Services: More than 4 times per year    Active Member of [employer] or Organizations: No    Attends Archivist Meetings: Never    Marital Status: Married     Family History: The patient's family history includes Diabetes in his father; Heart disease in his father; Liver disease in his mother. There is no history of Colon cancer, Stomach cancer, Pancreatic cancer, or Esophageal cancer.  ROS:   Please see the history of present illness.    All other systems reviewed and are negative.  EKGs/Labs/Other Studies Reviewed:     TTE 5/83/0940 Normal LV systolic function. Grade I diastolic dysfunction.  Coronary CT: 05/26/2022 Mild obstructive CAD 25-49%  Cardiac monitor 7 days: 05/2022 HR 32-132, avg 74. Slowest rate occurred with Mobitz II block, at 10:35 PM.  EKG:  Last EKG results: Sinus rhythm, 1st degree AV block   Recent  Labs: 05/07/2022: BUN 13; Creatinine, Ser 0.99; Hemoglobin 14.8; Platelets 210; Potassium 4.3; Sodium 143          Physical Exam:    VS:  BP 112/78   Pulse 75   Ht '5\' 7"'$  (1.702 m)   Wt 220 lb (99.8 kg)   SpO2 97%   BMI 34.46 kg/m     Wt Readings from Last 3 Encounters:  06/22/22 220 lb (99.8 kg)  05/07/22 217 lb (98.4 kg)  07/09/21 220 lb (99.8 kg)     GEN:  Well nourished, well developed in no acute distress CARDIAC: RRR, no murmurs, rubs, gallops RESPIRATORY:  Normal work of breathing MUSCULOSKELETAL: no edema    ASSESSMENT & PLAN:    Atrial fibrillation: s/p PVI 2012, 2020. Has been on flecainide in the past. Doing well without symptoms of recurrence. However, he was largely asymptomatic when paroxysmal in the past. I think it reasonable to place a loop monitor to monitor for AF. I would rather detect and treat paroxysms of AF before he becomes persistent again and symptomatic. Bradycardia: occurs nocturnally on recent monitor. He had a sleep evaluation recently that did not show significant OSA. Dyspnea, mild hypoxia: appears to be resolving. I cannot identify a cardiac -- or particularly an arrhythmic cause         Medication Adjustments/Labs and Tests Ordered: Current medicines are reviewed at length with the patient today.  Concerns regarding medicines are outlined above.  Orders Placed This Encounter  Procedures   EKG 12-Lead   No orders of the defined types were placed in this encounter.    Signed, Melida Quitter, MD  06/22/2022 10:10 AM    [place]

## 2022-06-22 ENCOUNTER — Ambulatory Visit: Payer: 59 | Attending: Cardiovascular Disease | Admitting: Cardiovascular Disease

## 2022-06-22 ENCOUNTER — Encounter: Payer: Self-pay | Admitting: Cardiovascular Disease

## 2022-06-22 VITALS — BP 112/78 | HR 75 | Ht 67.0 in | Wt 220.0 lb

## 2022-06-22 DIAGNOSIS — I480 Paroxysmal atrial fibrillation: Secondary | ICD-10-CM | POA: Diagnosis not present

## 2022-06-22 NOTE — Patient Instructions (Addendum)
Medication Instructions:  Your physician recommends that you continue on your current medications as directed. Please refer to the Current Medication list given to you today.  *If you need a refill on your cardiac medications before your next appointment, please call your pharmacy*  Testing/Procedures: You will have an implanted loop recorder inserted at your next office visit. There are not special instructions or restrictions prior to this procedure./  Follow-Up: At [facility], you and your health needs are our priority.  As part of our continuing mission to provide you with exceptional heart care, we have created designated Provider Care Teams.  These Care Teams include your primary Cardiologist (physician) and Advanced Practice Providers (APPs -  Physician Assistants and Nurse Practitioners) who all work together to provide you with the care you need, when you need it.  Your next appointment:   11/16 at 9:30am  The format for your next appointment:   In Person  Provider:   Doralee Albino, MD     Important Information About Sugar

## 2022-06-30 ENCOUNTER — Other Ambulatory Visit: Payer: Self-pay | Admitting: Internal Medicine

## 2022-07-05 NOTE — Progress Notes (Unsigned)
[address]       [place] [address]             [phone]     CRIMSON BEER [id number] [birth year]  History of Present Illness: Jorge Peterson is a 61 year old male with a past medical history of HTN, HLD, DM, GERD, atrial fibrillation (history of 2 ablations in 2012 and 2020), and asymptomatic bradycardia. He was referred to the office today by Tommye Standard, PA-C after undergoing a CT calcium score for new onset dyspnea. The coronary CT on 05/25/22 showed asymmetric dilatation at the sinus of valsalva measuring 4.7cm and mid ascending aorta measuring 3.0 cm but the CTA on 05/25/22 reports a normal caliber thoracic aorta. Echocardiogram on 05/13/22 mentions the aortic root measured 4.0cm. The coronary CT showed mild CAD and a partially imaged left upper lobe solid lung nodule. Jorge Peterson has a remote history of a left upper lobe lung nodule which was found to likely be a benign granuloma for which he was seen by our office in 2014 and 2015. His echocardiogram in 2023 did not visualize the aortic valve but echocardiogram in 2016 shows a trileaflet aortic valve. Today he reports chest tightness, and shortness of breath that has been improving as well as some dizziness without LOC. He denies chest pain.    Past Medical History:  Diagnosis Date   Atrial fibrillation [facility])    s/p afib ablation 7/12 and 11/20   Bradycardia    asymptomatic   Cataract    Mixed OU   Colonic diverticular abscess    Diabetes ([place])    Diverticulitis    Elevated LFTs    Fall    Family history of anesthesia complication    father gets pneumonia after "   GERD (gastroesophageal reflux disease)    Hyperlipidemia    Hyperplastic colon polyp    Hypertension    Hypertensive retinopathy    OU   Internal hemorrhoids    Tinnitus    Tubular adenoma of colon    Wears glasses     Current Outpatient Medications on File Prior to Visit  Medication Sig Dispense Refill   acetaminophen  (TYLENOL) 500 MG tablet Take 2 tablets (1,000 mg total) by mouth every 6 (six) hours as needed. 30 tablet 0   albuterol (VENTOLIN HFA) 108 (90 Base) MCG/ACT inhaler INHALE 2 PUFFS BY MOUTH EVERY 6 HOURS AS NEEDED FOR WHEEZE     Biotin 5000 MCG TABS Take 800 mcg by mouth daily.      brimonidine-timolol (COMBIGAN) 0.2-0.5 % ophthalmic solution      budesonide-formoterol (SYMBICORT) 160-4.5 MCG/ACT inhaler Inhale into the lungs.     ELIQUIS 5 MG TABS tablet TAKE 1 TABLET(5 MG) BY MOUTH TWICE DAILY 60 tablet 5   fluticasone (FLONASE) 50 MCG/ACT nasal spray Place 1 spray into the nose daily as needed for allergies.      folic acid (FOLVITE) 361 MCG tablet Take 400 mcg by mouth daily.     glipiZIDE (GLUCOTROL XL) 5 MG 24 hr tablet Take 5 mg by mouth daily.     hydrochlorothiazide (HYDRODIURIL) 12.5 MG tablet Take 12.5 mg by mouth daily.  3   losartan (COZAAR) 100 MG tablet Take 100 mg by mouth every evening.   3   Multiple Vitamin (MULTIVITAMIN WITH MINERALS) TABS tablet Take 1 tablet by mouth daily. Centrum Silver     Omega-3 Fatty Acids (FISH OIL PO) Take 1 capsule by mouth  2 (two) times daily.     omeprazole (PRILOSEC) 40 MG capsule TAKE 1 CAPSULE BY MOUTH EVERY DAY 90 capsule 0   rosuvastatin (CRESTOR) 10 MG tablet TAKE 1 TABLET(10 MG) BY MOUTH DAILY 90 tablet 3   TRULICITY 1.5 FK/8.1EX SOPN Inject 1.5 mg into the skin once a week.     No current facility-administered medications on file prior to visit.   Vitals: Today's Vitals   07/08/22 1344  BP: 105/71  Pulse: 86  Resp: 20  SpO2: 95%  Weight: 220 lb (99.8 kg)  Height: '5\' 7"'$  (1.702 m)   Body mass index is 34.46 kg/m.   Review of Systems  Constitutional:  Positive for malaise/fatigue. Negative for chills, diaphoresis, fever and weight loss.  HENT:  Positive for hearing loss and tinnitus.   Eyes:        Blindness in right eye   Respiratory:  Positive for cough and shortness of breath. Negative for wheezing.   Cardiovascular:   Negative for chest pain, palpitations, orthopnea and leg swelling.       Chest tightness   Gastrointestinal:  Negative for constipation, diarrhea, heartburn and nausea.  Musculoskeletal:  Negative for joint pain and myalgias.  Neurological:  Positive for dizziness and headaches. Negative for loss of consciousness and weakness.  Endo/Heme/Allergies:  Does not bruise/bleed easily.  Psychiatric/Behavioral:  Negative for depression. The patient is not nervous/anxious.     Physical Exam General: Alert, no distress Neuro: Grossly intact CV: Regular rate and rhythm, no murmur, rub or gallop Pulm: Clear to auscultation bilaterally Extremities: No edema, 2+ radial pulses   CTA Results:  ADDENDUM REPORT: 05/26/2022 16:00   EXAM: OVER-READ INTERPRETATION  CT CHEST   The following report is an over-read performed by radiologist Dr. [facility]audry [facility] on 05/26/2022. This over-read does not include interpretation of cardiac or coronary anatomy or pathology. The coronary CTA interpretation by the cardiologist is attached.   COMPARISON:  Chest CT dated April 30, 2014   FINDINGS: Vascular: Normal heart size. No pericardial effusion. Normal caliber thoracic aorta with no atherosclerotic disease.   Mediastinum/Nodes: Esophagus is unremarkable. No pathologically enlarged lymph nodes seen in the chest.   Lungs/Pleura: Lungs are clear. No pleural effusion or pneumothorax. Partially imaged solid pulmonary nodule of the left upper lobe, nodule also present on 2015 prior chest CT and considered benign, no follow-up imaging is recommended.   Upper Abdomen: No acute abnormality.   Musculoskeletal: No chest wall mass or suspicious bone lesions identified.   IMPRESSION: No acute or significant extracardiac findings in the visualized chest.     Electronically Signed   By: Yetta Glassman M.D.   On: 05/26/2022 16:00    HISTORY: Chest pain, nonspecific    History of PVI afib ablation 2012, 2020.   EXAM: Cardiac/Coronary  CT   TECHNIQUE: The patient was scanned on a [employer].   PROTOCOL: A 120 kV prospective scan was triggered in the descending thoracic aorta at 111 HU's. Axial non-contrast 3 mm slices were carried out through the heart. The data set was analyzed on a dedicated work station and scored using the Agatston method. Gantry rotation speed was 250 msecs and collimation was .6 mm. Beta blockade and 0.8 mg of sl NTG was given. The 3D data set was reconstructed in 5% intervals of the 35-75 % of the R-R cycle. Systolic and diastolic phases were analyzed on a dedicated work station using MPR, MIP and VRT modes. The patient received  contrast: 138m OMNIPAQUE IOHEXOL 350 MG/ML SOLN.   FINDINGS: Image quality: Good   Noise artifact is: Limited   Coronary calcium score is 10.7, which places the patient in the 35th percentile for age and sex matched control.   Coronary arteries: Normal coronary origins. Co-dominance.   Right Coronary Artery: Mild atherosclerotic plaque in the ostial RCA, 25-49% stenosis. Minimal atherosclerotic plaque in the mid and distal RCA, <25% stenosis. Patent RPDA.   Left Main Coronary Artery: No detectable plaque or stenosis.   Left Anterior Descending Coronary Artery: Minimal mixed atherosclerotic plaque in the ostial LAD, <25% stenosis. Mild atherosclerotic plaque in the mid and distal LAD, 25-49% stenosis. Patent diagonals   Left Circumflex Artery: Mild atherosclerotic plaque in the proximal first OM, 25-49% stenosis. Patent LPLA.   Aorta: Moderate asymmetric dilation at the sinus of Valsalva, 47 mm from right to left coronary cusp measured sinus to sinus technique. Measurements are 47 x 42 x 43 mm.   30 mm at the mid ascending aorta (level of the PA bifurcation) measured double oblique. Mild root calcifications.   Aortic Valve: No calcifications.   Other findings:    Normal pulmonary vein drainage into the left atrium. No pulmonary vein stenosis s/p PVI x 2.   Normal left atrial appendage without thrombus.   Mild dilation of main pulmonary artery, 29 mm.   [facility] for non-card[facility] findings.   IMPRESSION: 1. Mild CAD, 25-49% stenosis, CADRADS 2.   2. Coronary calcium score is 10.7, which places the patient in the 35th percentile for age and sex matched control.   3. Normal coronary origins with co-dominance.   4. Moderate asymmetric dilation at the sinus of Valsalva, 47 mm from right to left coronary cusp.   5.  No pulmonary vein stenosis.   RECOMMENDATIONS: CAD-RADS 2. Mild non-obstructive CAD (25-49%). Consider non-atherosclerotic causes of chest pain. Consider preventive therapy and risk factor modification.   Electronically Signed: By: GCherlynn KaiserM.D. On: 05/26/2022 15:27    A/P:  Ascending thoracic aortic aneurysm:  Jorge Peterson a 61year old male who was referred here due to an incidental finding of his aorta at the sinus of valsalva measuring 4.7cm. On CTA it is recorded that he has a normal caliber thoracic ascending aorta and on echocardiogram the aortic root measures 4.0cm. On echocardiogram he is shown to have a tricuspid aorta. He reports chest tightness without chest pain as well as improving shortness of breath. He is being closely followed and worked up by cardiology. His blood pressure today is well controlled at 105/71. Differing results in size of thoracic aortic aneurysm based on the scan but as discussed with surgeon he does not meet surgical threshold so plan to follow up in 1 year with CTA.   CAD:  He reports chest tightness without pain and shortness of breath. Vital signs look great today. Continue blood pressure control, statin, diet and exercise. Continue to follow up with cardiology   Reviewed Risk Modification:  Statin:  rosuvastatin '10mg'$   Smoking  cessation instruction/counseling given:  Nonsmoker  Patient was counseled on importance of Blood Pressure Control.  Despite Medical intervention if the patient notices persistently elevated blood pressure readings.  They are instructed to contact their Primary Care Physician  Please avoid use of Fluoroquinolones as this can potentially increase your risk of Aortic Rupture and/or Dissection  Exercise and activity limitations is individualized, but in general, contact sports are to be avoided and one  should avoid heavy lifting (defined as half of ideal body weight) and exercises involving sustained Valsalva maneuver.   Patient educated on signs and symptoms of Aortic Dissection, handout also provided in AVS   Magdalene River, PA-C 07/05/22

## 2022-07-08 ENCOUNTER — Institutional Professional Consult (permissible substitution): Payer: 59 | Admitting: Physician Assistant

## 2022-07-08 VITALS — BP 105/71 | HR 86 | Resp 20 | Ht 67.0 in | Wt 220.0 lb

## 2022-07-08 DIAGNOSIS — I7121 Aneurysm of the ascending aorta, without rupture: Secondary | ICD-10-CM

## 2022-07-08 NOTE — Patient Instructions (Signed)

## 2022-07-12 ENCOUNTER — Telehealth: Payer: Self-pay | Admitting: Cardiovascular Disease

## 2022-07-12 NOTE — Telephone Encounter (Signed)
Calling to see if our office received the authorization number. Please advise

## 2022-07-12 NOTE — Telephone Encounter (Signed)
Patient and his wife want to know what the next steps are since insurance denied his loop implant. I advised I would forward to Dr. Myles Gip for review.

## 2022-07-15 ENCOUNTER — Ambulatory Visit: Payer: 59 | Admitting: Cardiovascular Disease

## 2022-08-11 ENCOUNTER — Other Ambulatory Visit: Payer: Self-pay | Admitting: Internal Medicine

## 2022-08-11 DIAGNOSIS — I480 Paroxysmal atrial fibrillation: Secondary | ICD-10-CM

## 2022-08-11 NOTE — Telephone Encounter (Signed)
Prescription refill request for Eliquis received. Indication: Afib  Last office visit:06/22/22 ([place])  Scr: 1.0 (05/17/22)  Age: 61 Weight: 99.8kg  Appropriate dose and refill sent to requested pharmacy.

## 2022-08-19 ENCOUNTER — Ambulatory Visit: Payer: 59 | Admitting: Dermatology

## 2022-08-19 VITALS — BP 159/91 | HR 73

## 2022-08-19 DIAGNOSIS — L578 Other skin changes due to chronic exposure to nonionizing radiation: Secondary | ICD-10-CM | POA: Diagnosis not present

## 2022-08-19 DIAGNOSIS — L821 Other seborrheic keratosis: Secondary | ICD-10-CM

## 2022-08-19 DIAGNOSIS — D229 Melanocytic nevi, unspecified: Secondary | ICD-10-CM

## 2022-08-19 DIAGNOSIS — D225 Melanocytic nevi of trunk: Secondary | ICD-10-CM

## 2022-08-19 DIAGNOSIS — L570 Actinic keratosis: Secondary | ICD-10-CM

## 2022-08-19 DIAGNOSIS — Z1283 Encounter for screening for malignant neoplasm of skin: Secondary | ICD-10-CM

## 2022-08-19 DIAGNOSIS — L814 Other melanin hyperpigmentation: Secondary | ICD-10-CM | POA: Diagnosis not present

## 2022-08-19 DIAGNOSIS — Z5111 Encounter for antineoplastic chemotherapy: Secondary | ICD-10-CM

## 2022-08-19 DIAGNOSIS — Z79899 Other long term (current) drug therapy: Secondary | ICD-10-CM

## 2022-08-19 MED ORDER — FLUOROURACIL 5 % EX CREA: TOPICAL_CREAM | Freq: Two times a day (BID) | CUTANEOUS | 2 refills | Status: DC

## 2022-08-19 NOTE — Progress Notes (Signed)
Follow-Up Visit   Subjective  Jorge Peterson is a 61 y.o. male who presents for the following: Upper body skin exam (Hx of AKs). The patient presents for Upper Body Skin Exam (UBSE) for skin cancer screening and mole check.  The patient has spots, moles and lesions to be evaluated, some may be new or changing and the patient has concerns that these could be cancer.   The following portions of the chart were reviewed this encounter and updated as appropriate:   Tobacco  Allergies  Meds  Problems  Med Hx  Surg Hx  Fam Hx     Review of Systems:  No other skin or systemic complaints except as noted in HPI or Assessment and Plan.  Objective  Well appearing patient in no apparent distress; mood and affect are within normal limits.  All skin waist up examined.  face x 4 (4) Pink scaly macules   Assessment & Plan   Lentigines - Scattered tan macules - Due to sun exposure - Benign-appearing, observe - Recommend daily broad spectrum sunscreen SPF 30+ to sun-exposed areas, reapply every 2 hours as needed. - Call for any changes - back, arms  Seborrheic Keratoses - Stuck-on, waxy, tan-brown papules and/or plaques  - Benign-appearing - Discussed benign etiology and prognosis. - Observe - Call for any changes - trunk  Melanocytic Nevi - Tan-brown and/or pink-flesh-colored symmetric macules and papules - Benign appearing on exam today - Observation - Call clinic for new or changing moles - Recommend daily use of broad spectrum spf 30+ sunscreen to sun-exposed areas.  - trunk  Hemangiomas - Red papules - Discussed benign nature - Observe - Call for any changes - trunk  Actinic Damage with PreCancerous Actinic Keratoses Counseling for Topical Chemotherapy Management: Patient exhibits: - Severe, confluent actinic changes with pre-cancerous actinic keratoses that is secondary to cumulative UV radiation exposure over time - Condition that is severe; chronic, not at  goal. - diffuse scaly erythematous macules and papules with underlying dyspigmentation - Discussed Prescription "Field Treatment" topical Chemotherapy for Severe, Chronic Confluent Actinic Changes with Pre-Cancerous Actinic Keratoses Field treatment involves treatment of an entire area of skin that has confluent Actinic Changes (Sun/ Ultraviolet light damage) and PreCancerous Actinic Keratoses by method of PhotoDynamic Therapy (PDT) and/or prescription Topical Chemotherapy agents such as 5-fluorouracil, 5-fluorouracil/calcipotriene, and/or imiquimod.  The purpose is to decrease the number of clinically evident and subclinical PreCancerous lesions to prevent progression to development of skin cancer by chemically destroying early precancer changes that may or may not be visible.  It has been shown to reduce the risk of developing skin cancer in the treated area. As a result of treatment, redness, scaling, crusting, and open sores may occur during treatment course. One or more than one of these methods may be used and may have to be used several times to control, suppress and eliminate the PreCancerous changes. Discussed treatment course, expected reaction, and possible side effects. - Recommend daily broad spectrum sunscreen SPF 30+ to sun-exposed areas, reapply every 2 hours as needed.  - Staying in the shade or wearing long sleeves, sun glasses (UVA+UVB protection) and wide brim hats (4-inch brim around the entire circumference of the hat) are also recommended. - Call for new or changing lesions.  -- Start 5-fluorouracil/calcipotriene cream twice a day for 7 days to affected areas including forehead, temples. Prescription sent to [facility]. Patient advised they will receive an email to purchase the medication online and have it  sent to their home. Patient provided with handout reviewing treatment course and side effects and advised to call or message [place] on MyChart with any  concerns.  Reviewed course of treatment and expected reaction.  Patient advised to expect inflammation and crusting and advised that erosions are possible.  Patient advised to be diligent with sun protection during and after treatment. Counseled to keep medication out of reach of children and pets.   Skin cancer screening performed today.   AK (actinic keratosis) (4) face x 4  Destruction of lesion - face x 4 Complexity: simple   Destruction method: cryotherapy   Informed consent: discussed and consent obtained   Timeout:  patient name, date of birth, surgical site, and procedure verified Lesion destroyed using liquid nitrogen: Yes   Region frozen until ice ball extended beyond lesion: Yes   Outcome: patient tolerated procedure well with no complications   Post-procedure details: wound care instructions given    fluorouracil (EFUDEX) 5 % cream - face x 4 Apply topically 2 (two) times daily. Bid to forehead and temples   Return in about 6 months (around 02/18/2023) for AK f/u.  I, Othelia Pulling, RMA, am acting as scribe for Sarina Ser, MD .  Documentation: I have reviewed the above documentation for accuracy and completeness, and I agree with the above.  Sarina Ser, MD

## 2022-08-19 NOTE — Patient Instructions (Addendum)
In January - Start 5-fluorouracil/calcipotriene cream twice a day for 7 days to affected areas including forehead, temples. Prescription sent to [facility]   Instructions for [facility]  One or more of your medications was sent to the [facility]. You will receive an email from them and can purchase the medicine through that link. It will then be mailed to your home at the address you confirmed. If for any reason you do not receive an email from them, please check your spam folder. If you still do not find the email, please let us know. [facility] phone number is [phone].   5-Fluorouracil/Calcipotriene Patient Education   Actinic keratoses are the dry, red scaly spots on the skin caused by sun damage. A portion of these spots can turn into skin cancer with time, and treating them can help prevent development of skin cancer.   Treatment of these spots requires removal of the defective skin cells. There are various ways to remove actinic keratoses, including freezing with liquid nitrogen, treatment with creams, or treatment with a blue light procedure in the office.   5-fluorouracil cream is a topical cream used to treat actinic keratoses. It works by interfering with the growth of abnormal fast-growing skin cells, such as actinic keratoses. These cells peel off and are replaced by healthy ones.   5-fluorouracil/calcipotriene is a combination of the 5-fluorouracil cream with a vitamin D analog cream called calcipotriene. The calcipotriene alone does not treat actinic keratoses. However, when it is combined with 5-fluorouracil, it helps the 5-fluorouracil treat the actinic keratoses much faster so that the same results can be achieved with a much shorter treatment time.  INSTRUCTIONS FOR 5-FLUOROURACIL/CALCIPOTRIENE CREAM:   5-fluorouracil/calcipotriene cream typically only needs to be used for 4-7 days. A thin  layer should be applied twice a day to the treatment areas recommended by your physician.   If your physician prescribed you separate tubes of 5-fluourouracil and calcipotriene, apply a thin layer of 5-fluorouracil followed by a thin layer of calcipotriene.   Avoid contact with your eyes, nostrils, and mouth. Do not use 5-fluorouracil/calcipotriene cream on infected or open wounds.   You will develop redness, irritation and some crusting at areas where you have pre-cancer damage/actinic keratoses. IF YOU DEVELOP PAIN, BLEEDING, OR SIGNIFICANT CRUSTING, STOP THE TREATMENT EARLY - you have already gotten a good response and the actinic keratoses should clear up well.  Wash your hands after applying 5-fluorouracil 5% cream on your skin.   A moisturizer or sunscreen with a minimum SPF 30 should be applied each morning.   Once you have finished the treatment, you can apply a thin layer of Vaseline twice a day to irritated areas to soothe and calm the areas more quickly. If you experience significant discomfort, contact your physician.  For some patients it is necessary to repeat the treatment for best results.  SIDE EFFECTS: When using 5-fluorouracil/calcipotriene cream, you may have mild irritation, such as redness, dryness, swelling, or a mild burning sensation. This usually resolves within 2 weeks. The more actinic keratoses you have, the more redness and inflammation you can expect during treatment. Eye irritation has been reported rarely. If this occurs, please let us know.  If you have any trouble using this cream, please call the office. If you have any other questions about this information, please do not hesitate to ask me before you leave the office.     Cryotherapy Aftercare  Wash gently with  soap and water everyday.   Apply Vaseline and Band-Aid daily until healed.    Due to recent changes in healthcare laws, you may see results of your pathology and/or laboratory studies on  MyChart before the doctors have had a chance to review them. We understand that in some cases there may be results that are confusing or concerning to you. Please understand that not all results are received at the same time and often the doctors may need to interpret multiple results in order to provide you with the best plan of care or course of treatment. Therefore, we ask that you please give [place] 2 business days to thoroughly review all your results before contacting the office for clarification. Should we see a critical lab result, you will be contacted sooner.   If You Need Anything After Your Visit  If you have any questions or concerns for your doctor, please call our main line at [phone] and press option 4 to reach your doctor's medical assistant. If no one answers, please leave a voicemail as directed and we will return your call as soon as possible. Messages left after 4 pm will be answered the following business day.   You may also send [place] a message via [place]. We typically respond to MyChart messages within 1-2 business days.  For prescription refills, please ask your pharmacy to contact our office. Our fax number is [phone].  If you have an urgent issue when the clinic is closed that cannot wait until the next business day, you can page your doctor at the number below.    Please note that while we do our best to be available for urgent issues outside of office hours, we are not available 24/7.   If you have an urgent issue and are unable to reach [place], you may choose to seek medical care at your doctor's office, retail clinic, urgent care center, or emergency room.  If you have a medical emergency, please immediately call 911 or go to the emergency department.  Pager Numbers  - Dr. Nehemiah Massed: [phone]  - Dr. Laurence Ferrari: [phone]  - Dr. Nicole Kindred: [phone]  In the event of inclement weather, please call our main line at [phone] for an update on the status of  any delays or closures.  Dermatology Medication Tips: Please keep the boxes that topical medications come in in order to help keep track of the instructions about where and how to use these. Pharmacies typically print the medication instructions only on the boxes and not directly on the medication tubes.   If your medication is too expensive, please contact our office at [phone] option 4 or send [place] a message through [place].   We are unable to tell what your co-pay for medications will be in advance as this is different depending on your insurance coverage. However, we may be able to find a substitute medication at lower cost or fill out paperwork to get insurance to cover a needed medication.   If a prior authorization is required to get your medication covered by your insurance company, please allow [place] 1-2 business days to complete this process.  Drug prices often vary depending on where the prescription is filled and some pharmacies may offer cheaper prices.  The website www.goodrx.com contains coupons for medications through different pharmacies. The prices here do not account for what the cost may be with help from insurance (it may be cheaper with your insurance), but the website can give you the price if you  did not use any insurance.  - You can print the associated coupon and take it with your prescription to the pharmacy.  - You may also stop by our office during regular business hours and pick up a GoodRx coupon card.  - If you need your prescription sent electronically to a different pharmacy, notify our office through [facility] or by phone at [phone] option 4.     Si Usted Necesita Algo Despus de Su Visita  Tambin puede enviarnos un mensaje a travs de Pharmacist, community. Por lo general respondemos a los mensajes de MyChart en el transcurso de 1 a 2 das hbiles.  Para renovar recetas, por favor pida a su farmacia que se ponga en contacto con nuestra oficina. Harland Dingwall de fax es [place] [phone].  Si tiene un asunto urgente cuando la clnica est cerrada y que no puede esperar hasta el siguiente da hbil, puede llamar/localizar a su doctor(a) al nmero que aparece a continuacin.   Por favor, tenga en cuenta que aunque hacemos todo lo posible para estar disponibles para asuntos urgentes fuera del horario de [place], no estamos disponibles las 24 horas del da, los 7 das de la [place].   Si tiene un problema urgente y no puede comunicarse con nosotros, puede optar por buscar atencin mdica  en el consultorio de su doctor(a), en una clnica privada, en un centro de atencin urgente o en una sala de emergencias.  Si tiene Engineering geologist, por favor llame inmediatamente al 911 o vaya a la sala de emergencias.  Nmeros de bper  - Dr. Nehemiah Massed: [phone]  - Dra. Moye: [phone]  - Dra. Nicole Kindred: [phone]  En caso de inclemencias del [place], por favor llame a Johnsie Kindred principal al [phone] para una actualizacin sobre el [place] de cualquier retraso o cierre.  Consejos para la medicacin en dermatologa: Por favor, guarde las cajas en las que vienen los medicamentos de uso tpico para ayudarle a seguir las instrucciones sobre dnde y cmo usarlos. Las farmacias generalmente imprimen las instrucciones del medicamento slo en las cajas y no directamente en los tubos del [place].   Si su medicamento es muy caro, por favor, pngase en contacto con Zigmund Daniel llamando al [phone] y presione la opcin 4 o envenos un mensaje a travs de Pharmacist, community.   No podemos decirle cul ser su copago por los medicamentos por adelantado ya que esto es diferente dependiendo de la cobertura de su seguro. Sin embargo, es posible que podamos encontrar un medicamento sustituto a Electrical engineer un formulario para que el seguro cubra el medicamento que se considera necesario.   Si se requiere una autorizacin previa para que su compaa de  seguros [place] su medicamento, por favor permtanos de 1 a 2 das hbiles para completar este proceso.  Los precios de los medicamentos varan con frecuencia dependiendo del Environmental consultant de dnde se surte la receta y alguna farmacias pueden ofrecer precios ms baratos.  El sitio web www.goodrx.com tiene cupones para medicamentos de Airline pilot. Los precios aqu no tienen en cuenta lo que podra costar con la ayuda del seguro (puede ser ms barato con su seguro), pero el sitio web puede darle el precio si no utiliz Research scientist (physical sciences).  - Puede imprimir el cupn correspondiente y llevarlo con su receta a la farmacia.  - Tambin puede pasar por nuestra oficina durante el horario de atencin regular y Charity fundraiser una tarjeta de cupones de GoodRx.  - Si necesita que su receta se  enve electrnicamente a una farmacia diferente, informe a nuestra oficina a travs de MyChart de [place] o por telfono llamando al [phone] y presione la opcin 4.

## 2022-08-28 ENCOUNTER — Encounter: Payer: Self-pay | Admitting: Dermatology

## 2022-08-31 NOTE — Progress Notes (Signed)
[facility] Note  09/06/2022     CHIEF COMPLAINT Patient presents for Retina Follow Up  HISTORY OF PRESENT ILLNESS: Jorge Peterson is a 62 y.o. male who presents to the clinic today for:  HPI     Retina Follow Up   Patient presents with  Other.  This started 1 year ago.  Duration of 1 year.  I, the attending physician,  performed the HPI with the patient and updated documentation appropriately.        Comments   1 year retina eval coats disease pt is reporting no vision changes noticed he denies any flashes or floaters pt last reading was 136 last A1C 7.3 few months ago       Last edited by Bernarda Caffey, MD on 09/06/2022  1:32 PM.     Pt states VA is stable, no major changes.   Referring physician: [name], MD [place],  [place] [id number]  HISTORICAL INFORMATION:   Selected notes from the MEDICAL RECORD NUMBER Referred by Dr. Ellin Mayhew for retinal vasculitis OD   CURRENT MEDICATIONS: Current Outpatient Medications (Ophthalmic Drugs)  Medication Sig   brimonidine-timolol (COMBIGAN) 0.2-0.5 % ophthalmic solution    No current facility-administered medications for this visit. (Ophthalmic Drugs)   Current Outpatient Medications (Other)  Medication Sig   acetaminophen (TYLENOL) 500 MG tablet Take 2 tablets (1,000 mg total) by mouth every 6 (six) hours as needed.   albuterol (VENTOLIN HFA) 108 (90 Base) MCG/ACT inhaler INHALE 2 PUFFS BY MOUTH EVERY 6 HOURS AS NEEDED FOR WHEEZE   Biotin 5000 MCG TABS Take 800 mcg by mouth daily.    budesonide-formoterol (SYMBICORT) 160-4.5 MCG/ACT inhaler Inhale into the lungs.   ELIQUIS 5 MG TABS tablet TAKE 1 TABLET(5 MG) BY MOUTH TWICE DAILY   fluorouracil (EFUDEX) 5 % cream Apply topically 2 (two) times daily. Bid to forehead and temples   fluticasone (FLONASE) 50 MCG/ACT nasal spray Place 1 spray into the nose daily as needed for allergies.    folic acid (FOLVITE) 841  MCG tablet Take 400 mcg by mouth daily.   glipiZIDE (GLUCOTROL XL) 5 MG 24 hr tablet Take 5 mg by mouth daily.   hydrochlorothiazide (HYDRODIURIL) 12.5 MG tablet Take 12.5 mg by mouth daily.   losartan (COZAAR) 100 MG tablet Take 100 mg by mouth every evening.    Multiple Vitamin (MULTIVITAMIN WITH MINERALS) TABS tablet Take 1 tablet by mouth daily. Centrum Silver   Omega-3 Fatty Acids (FISH OIL PO) Take 1 capsule by mouth 2 (two) times daily.   omeprazole (PRILOSEC) 40 MG capsule TAKE 1 CAPSULE BY MOUTH EVERY DAY   rosuvastatin (CRESTOR) 10 MG tablet TAKE 1 TABLET(10 MG) BY MOUTH DAILY   TRULICITY 1.5 LK/4.4WN SOPN Inject 1.5 mg into the skin once a week.   No current facility-administered medications for this visit. (Other)   REVIEW OF SYSTEMS: ROS   Positive for: Eyes Last edited by Bernarda Caffey, MD on 09/06/2022  1:32 PM.     ALLERGIES Allergies  Allergen Reactions   Codeine Itching and Rash   Latex Itching and Rash   Oxycodone-Acetaminophen Itching and Rash   PAST MEDICAL HISTORY Past Medical History:  Diagnosis Date   Atrial fibrillation [facility])    s/p afib ablation 7/12 and 11/20   Bradycardia    asymptomatic   Cataract    Mixed OU   Colonic diverticular abscess    Diabetes ([place])  Diverticulitis    Elevated LFTs    Fall    Family history of anesthesia complication    father gets pneumonia after "   GERD (gastroesophageal reflux disease)    Hyperlipidemia    Hyperplastic colon polyp    Hypertension    Hypertensive retinopathy    OU   Internal hemorrhoids    Tinnitus    Tubular adenoma of colon    Wears glasses    Past Surgical History:  Procedure Laterality Date   ATRIAL ABLATION SURGERY  03/24/11   PVI by [place] N/A 07/24/2019   Procedure: [place];  Surgeon: [name], MD;  Location: [place] [facility];  Service: Cardiovascular;  Laterality: N/A;   BACK SURGERY     BRONCHOSCOPY  01/23/2013    biopsy    CHOLECYSTECTOMY  05/02/2014   LAP CHOLE   CHOLECYSTECTOMY N/A 05/02/2014   Procedure: LAPAROSCOPIC CHOLECYSTECTOMY WITH INTRAOPERATIVE CHOLANGIOGRAM;  Surgeon: Erroll Luna, MD;  Location: [place];  Service: General;  Laterality: N/A;   EP IMPLANTABLE DEVICE N/A 06/27/2015   Procedure: Loop Recorder Insertion;  Surgeon: [name], MD;  Location: [place] [facility];  Service: Cardiovascular;  Laterality: N/A;   ERCP N/A 05/25/2014   Procedure: ENDOSCOPIC RETROGRADE CHOLANGIOPANCREATOGRAPHY (ERCP);  Surgeon: [name], MD;  Location: [place];  Service: Endoscopy;  Laterality: N/A;   Fistula surgery     LOOP RECORDER REMOVAL N/A 07/24/2019   Procedure: LOOP RECORDER REMOVAL;  Surgeon: [name], MD;  Location: [place] [facility];  Service: Cardiovascular;  Laterality: N/A;   MEDIASTINOSCOPY N/A 01/23/2013   Procedure: MEDIASTINOSCOPY;  Surgeon: Ivin Poot, MD;  Location: [place];  Service: Thoracic;  Laterality: N/A;   QUADRICEPS TENDON REPAIR Right 01/13/2016   Procedure: REPAIR QUADRICEP TENDON;  Surgeon: Latanya Maudlin, MD;  Location: [facility];  Service: Orthopedics;  Laterality: Right;  Received femoral nerve block in holding   SHOULDER SURGERY     x 2   TONSILLECTOMY     VIDEO BRONCHOSCOPY WITH ENDOBRONCHIAL ULTRASOUND N/A 01/23/2013   Procedure: VIDEO BRONCHOSCOPY WITH ENDOBRONCHIAL ULTRASOUND;  Surgeon: Ivin Poot, MD;  Location: [facility];  Service: Thoracic;  Laterality: N/A;   FAMILY HISTORY Family History  Problem Relation Age of Onset   Liver disease Mother        fatty liver   Diabetes Father    Heart disease Father        CABG at 66   Colon cancer Neg Hx    Stomach cancer Neg Hx    Pancreatic cancer Neg Hx    Esophageal cancer Neg Hx    SOCIAL HISTORY Social History   Tobacco Use   Smoking status: Never   Smokeless tobacco: Never  Vaping Use   Vaping Use: Never used  Substance Use Topics   Alcohol use: No   Drug use: No       OPHTHALMIC  EXAM:  Base Eye Exam     Visual Acuity (Snellen - Linear)       Right Left   Dist cc HM 20/20   Dist ph cc NI          Tonometry (Tonopen, 9:13 AM)       Right Left   Pressure 11 18  Squeezing os        Pupils       Pupils Dark Light Shape React APD   Right PERRL 4 3 Round Brisk None  Left PERRL 4 3 Round Brisk None         Visual Fields       Left Right    Full    Restrictions  Total superior temporal, inferior temporal, inferior nasal deficiencies         Extraocular Movement       Right Left    Full, Ortho Full, Ortho         Neuro/Psych     Oriented x3: Yes   Mood/Affect: Normal         Dilation     Both eyes: 2.5% Phenylephrine @ 9:13 AM           Slit Lamp and Fundus Exam     Slit Lamp Exam       Right Left   Lids/Lashes Dermatochalasis - upper lid, mild Meibomian gland dysfunction Dermatochalasis - upper lid, mild Meibomian gland dysfunction   Conjunctiva/Sclera White and quiet White and quiet   Cornea Trace Punctate epithelial erosions Clear   Anterior Chamber Deep and quiet, no cell or flare Deep and quiet   Iris Round and dilated, No NVI Round and dilated, No NVI   Lens 2+ Nuclear sclerosis, 2+ Cortical cataract 2+ Nuclear sclerosis, 2+ Cortical cataract   Anterior Vitreous Mild Vitreous syneresis, mild cell/pigment, Posterior vitreous detachment, vitreous condensations Mild Vitreous syneresis         Fundus Exam       Right Left   Disc 3+ Pallor, mild fibrosis, +PPA, +cupping Pink and Sharp, trace focal fibrosis at nasal rim   C/D Ratio 0.8 0.3   Macula Blunted foveal reflex, large central pigment clump, RPE mottling, clumping and atrophy, +edema, scattered exudate, +IRH inferiorly - improved, pre-retinal fibrosis temporal macula, scattered sub-retinal fibrosis Flat, Good foveal reflex, No heme or edema, RPE mottling   Vessels attenuation, +sheathing, Tortuous attenuation, mild Tortuousity, AV crossing changes    Periphery Attached, scattered pigment clumping, sclerotic vessels with sheathing, focal cluster of DBH at 0315 with surrounding circinate exudate - slightly improved, pigmented fibrotic scarring superiorly, +edema, good 360 PRP changes except for a few focal areas of edema temporally and nasally; Mild interval improvement in [facility] Attached; no heme or exudate           Refraction     Wearing Rx       Sphere Cylinder Axis   Right [place]     Left -1.00 +1.00 175           IMAGING AND PROCEDURES  Imaging and Procedures for '@TODAY'$ @  OCT, Retina - OU - Both Eyes       Right Eye Quality was good. Central Foveal Thickness: 260. Progression has improved. Findings include no SRF, abnormal foveal contour, subretinal hyper-reflective material, intraretinal hyper-reflective material, epiretinal membrane, intraretinal fluid, outer retinal atrophy, preretinal fibrosis (stable improvement in peripheral IRF/IRHM and edema).   Left Eye Quality was good. Central Foveal Thickness: 293. Progression has been stable. Findings include normal foveal contour, no IRF, no SRF, vitreomacular adhesion .   Notes *Images captured and stored on drive  Diagnosis / Impression:  OD: diffuse atrophy and peripheral IRF 2/2 Coats Disease -- Interval improvement in peripheral IRF/IRHM and edema OS: NFP, no IRF/SRF  Clinical management:  See below  Abbreviations: NFP - Normal foveal profile. CME - cystoid macular edema. PED - pigment epithelial detachment. IRF - intraretinal fluid. SRF - subretinal fluid. EZ - ellipsoid zone. ERM - epiretinal membrane. ORA - outer retinal atrophy. ORT -  outer retinal tubulation. SRHM - subretinal hyper-reflective material      Color Fundus Photography Optos - OU - Both Eyes       Right Eye Progression has improved. Disc findings include pallor. Macula : exudates, retinal pigment epithelium abnormalities. Vessels : attenuated (Coats disease with sheathing and perivascular  exudation--slightly improved; interval improvement in exudation). Periphery : hemorrhage, RPE abnormality (Interval improvement in focal hemorrhages nasally).   Left Eye Progression has been stable. Disc findings include normal observations. Macula : normal observations. Vessels : normal observations. Periphery : normal observations.   Notes **Images stored on drive**  Impression: OD: Coats disease with good 360 PRP, interval improvement in exudates and IRH OS: normal study, stable             ASSESSMENT/PLAN:    ICD-10-CM   1. Coats' disease of right eye  H35.021 OCT, Retina - OU - Both Eyes    Color Fundus Photography Optos - OU - Both Eyes    2. Proliferative retinopathy, non-diabetic, right  H35.21     3. Retinal neovascularization of right eye  H35.051     4. Iris neovascularization, right  H21.1X1     5. Neovascular glaucoma of right eye, severe stage  H40.51X3     6. Essential hypertension  I10     7. Hypertensive retinopathy of both eyes  H35.033 Color Fundus Photography Optos - OU - Both Eyes    8. Diabetes mellitus type 2 without retinopathy ([place])  E11.9     9. Combined forms of age-related cataract of both eyes  H25.813      1. Coats Disease OD  - Pt reports longstanding low vision and "inflammation" which resulted in low vision since early childhood  - mother reports early diagnosis of Coats Disease in pt  - denies eye pain  - exam shows extensive vascular sheathing and exudative disease -- improved from prior  - FA 1.24.20 shows severe peripheral vascular nonperfusion and leakage at border of perfusion/nonperfusion  - s/p PRP OD (01.20.21)  - FA 3.24.21 shows interval improvement in vascular leakage post PRP laser  - f/u 1 year, DFE, OCT, optos color/FAF  2-5. Proliferative retinopathy w/ retinal and iris neovascularization and Neovascular Glaucoma OD  - secondary to long standing retinal vasculitis / Coats disease  - IOP 12 today -- on Combigan  QDaily per Dr. Ellin Mayhew -- pt states using drop about every other day  - s/p PRP OD as above  - exam shows stable regression of NVI and retinal NV  - discussed findings  - f/u in 1 year  6,7. Hypertensive retinopathy OU  - discussed importance of tight BP control  - monitor  8. Diabetes mellitus, type 2 without retinopathy  - The incidence, risk factors for progression, natural history and treatment options for diabetic retinopathy  were discussed with patient.    - The need for close monitoring of blood glucose, blood pressure, and serum lipids, avoiding cigarette or any type of tobacco, and the need for long term follow up was also discussed with patient.  - monitor  9. Mixed form age related cataract OU  - The symptoms of cataract, surgical options, and treatments and risks were discussed with patient.  - discussed diagnosis and progression  - monitor for now  Ophthalmic Meds Ordered this visit:  No orders of the defined types were placed in this encounter.    Return in about 1 year (around 09/07/2023) for DFE, OCT, Optos color/FAF.  There  are no Patient Instructions on file for this visit.  Explained the diagnoses, plan, and follow up with the patient and they expressed understanding.  Patient expressed understanding of the importance of proper follow up care.   This document serves as a record of services personally performed by Gardiner Sleeper, MD, PhD. It was created on their behalf by Roselee Nova, COMT. The creation of this record is the provider's dictation and/or activities during the visit.  Electronically signed by: Roselee Nova, COMT 09/06/22 1:33 PM  This document serves as a record of services personally performed by Gardiner Sleeper, MD, PhD. It was created on their behalf by Orvan Falconer, an ophthalmic technician. The creation of this record is the provider's dictation and/or activities during the visit.    Electronically signed by: Orvan Falconer, OA, 09/06/22   1:33 PM  Gardiner Sleeper, M.D., Ph.D. Diseases & Surgery of the Retina and Vitreous [facility]  I have reviewed the above documentation for accuracy and completeness, and I agree with the above. Gardiner Sleeper, M.D., Ph.D. 09/06/22 1:34 PM  Abbreviations: M myopia (nearsighted); A astigmatism; H hyperopia (farsighted); P presbyopia; Mrx spectacle prescription;  CTL contact lenses; OD right eye; OS left eye; OU both eyes  XT exotropia; ET esotropia; PEK punctate epithelial keratitis; PEE punctate epithelial erosions; DES dry eye syndrome; MGD meibomian gland dysfunction; ATs artificial tears; PFAT's preservative free artificial tears; [place] nuclear sclerotic cataract; PSC posterior subcapsular cataract; ERM epi-retinal membrane; PVD posterior vitreous detachment; RD retinal detachment; DM diabetes mellitus; DR diabetic retinopathy; NPDR non-proliferative diabetic retinopathy; PDR proliferative diabetic retinopathy; CSME clinically significant macular edema; DME diabetic macular edema; dbh dot blot hemorrhages; CWS cotton wool spot; POAG primary open angle glaucoma; C/D cup-to-disc ratio; HVF humphrey visual field; GVF goldmann visual field; OCT optical coherence tomography; IOP intraocular pressure; BRVO Branch retinal vein occlusion; CRVO central retinal vein occlusion; CRAO central retinal artery occlusion; BRAO branch retinal artery occlusion; RT retinal tear; SB scleral buckle; PPV pars plana vitrectomy; VH Vitreous hemorrhage; PRP panretinal laser photocoagulation; IVK intravitreal kenalog; VMT vitreomacular traction; MH Macular hole;  NVD neovascularization of the disc; NVE neovascularization elsewhere; AREDS age related eye disease study; ARMD age related macular degeneration; POAG primary open angle glaucoma; EBMD epithelial/anterior basement membrane dystrophy; ACIOL anterior chamber intraocular lens; IOL intraocular lens; PCIOL posterior chamber intraocular lens; Phaco/IOL  phacoemulsification with intraocular lens placement; [place] photorefractive keratectomy; LASIK laser assisted in situ keratomileusis; HTN hypertension; DM diabetes mellitus; COPD chronic obstructive pulmonary disease

## 2022-09-06 ENCOUNTER — Ambulatory Visit (INDEPENDENT_AMBULATORY_CARE_PROVIDER_SITE_OTHER): Payer: 59 | Admitting: Ophthalmology

## 2022-09-06 ENCOUNTER — Encounter (INDEPENDENT_AMBULATORY_CARE_PROVIDER_SITE_OTHER): Payer: Self-pay | Admitting: Ophthalmology

## 2022-09-06 DIAGNOSIS — H211X1 Other vascular disorders of iris and ciliary body, right eye: Secondary | ICD-10-CM

## 2022-09-06 DIAGNOSIS — H35033 Hypertensive retinopathy, bilateral: Secondary | ICD-10-CM | POA: Diagnosis not present

## 2022-09-06 DIAGNOSIS — H3521 Other non-diabetic proliferative retinopathy, right eye: Secondary | ICD-10-CM

## 2022-09-06 DIAGNOSIS — H35021 Exudative retinopathy, right eye: Secondary | ICD-10-CM | POA: Diagnosis not present

## 2022-09-06 DIAGNOSIS — E119 Type 2 diabetes mellitus without complications: Secondary | ICD-10-CM

## 2022-09-06 DIAGNOSIS — H35051 Retinal neovascularization, unspecified, right eye: Secondary | ICD-10-CM

## 2022-09-06 DIAGNOSIS — H25813 Combined forms of age-related cataract, bilateral: Secondary | ICD-10-CM

## 2022-09-06 DIAGNOSIS — H4051X3 Glaucoma secondary to other eye disorders, right eye, severe stage: Secondary | ICD-10-CM

## 2022-09-06 DIAGNOSIS — I10 Essential (primary) hypertension: Secondary | ICD-10-CM | POA: Diagnosis not present

## 2022-10-18 ENCOUNTER — Encounter: Payer: Self-pay | Admitting: Cardiovascular Disease

## 2022-10-18 DIAGNOSIS — I480 Paroxysmal atrial fibrillation: Secondary | ICD-10-CM

## 2022-10-19 ENCOUNTER — Other Ambulatory Visit: Payer: Self-pay | Admitting: Internal Medicine

## 2022-11-01 ENCOUNTER — Ambulatory Visit: Payer: 59 | Attending: Cardiovascular Disease

## 2022-11-01 DIAGNOSIS — I480 Paroxysmal atrial fibrillation: Secondary | ICD-10-CM

## 2022-11-01 NOTE — Progress Notes (Unsigned)
Enrolled for [employer] to mail a ZIO XT long term holter monitor to the patients address on file.  

## 2022-11-07 DIAGNOSIS — I480 Paroxysmal atrial fibrillation: Secondary | ICD-10-CM | POA: Diagnosis not present

## 2022-11-10 ENCOUNTER — Encounter: Payer: Self-pay | Admitting: Internal Medicine

## 2022-11-11 ENCOUNTER — Telehealth: Payer: Self-pay | Admitting: Internal Medicine

## 2022-11-11 MED ORDER — CIPROFLOXACIN HCL 500 MG PO TABS: 500.0000 mg | ORAL_TABLET | Freq: Two times a day (BID) | ORAL | 0 refills | Status: DC

## 2022-11-11 NOTE — Telephone Encounter (Signed)
Probable recurrent SIBO Cipro 500 mg twice daily x 7 days  He has appointment with Anderson Malta scheduled

## 2022-11-11 NOTE — Telephone Encounter (Signed)
PJ were you speaking with this pt?

## 2022-11-11 NOTE — Telephone Encounter (Signed)
Inbound call from patient requesting to speak to [facility], wanting to know if it is okay to take Cipro due to his aortic aneurysm. Please advise.

## 2022-11-12 MED ORDER — AMOXICILLIN-POT CLAVULANATE 875-125 MG PO TABS: 1.0000 | ORAL_TABLET | Freq: Two times a day (BID) | ORAL | 0 refills | Status: DC

## 2022-11-12 NOTE — Telephone Encounter (Signed)
Since  patient has concerns and there is the risk + since other options I suggest and have Rxed generic Augmentin - please let him know Patty

## 2022-11-12 NOTE — Telephone Encounter (Signed)
The pt has been advised and will pick up the augmentin.

## 2022-12-10 ENCOUNTER — Other Ambulatory Visit: Payer: Self-pay | Admitting: Internal Medicine

## 2022-12-10 ENCOUNTER — Encounter: Payer: Self-pay | Admitting: Internal Medicine

## 2022-12-10 ENCOUNTER — Encounter: Payer: Self-pay | Admitting: Cardiovascular Disease

## 2022-12-10 ENCOUNTER — Ambulatory Visit: Payer: 59 | Attending: Cardiovascular Disease | Admitting: Cardiovascular Disease

## 2022-12-10 VITALS — BP 120/76 | HR 61 | Ht 67.0 in | Wt 220.0 lb

## 2022-12-10 DIAGNOSIS — I480 Paroxysmal atrial fibrillation: Secondary | ICD-10-CM | POA: Diagnosis not present

## 2022-12-10 MED ORDER — AMOXICILLIN-POT CLAVULANATE 875-125 MG PO TABS: 1.0000 | ORAL_TABLET | Freq: Two times a day (BID) | ORAL | 0 refills | Status: AC

## 2022-12-10 NOTE — Telephone Encounter (Signed)
Per Dr. Rhea Belton - Send Augmentin 875 BID x 7 days Prescription sent. Patient notified via MyChart.

## 2022-12-10 NOTE — Patient Instructions (Signed)
Medication Instructions:  Your physician recommends that you continue on your current medications as directed. Please refer to the Current Medication list given to you today. *If you need a refill on your cardiac medications before your next appointment, please call your pharmacy*   Follow-Up: At [facility], you and your health needs are our priority.  As part of our continuing mission to provide you with exceptional heart care, we have created designated Provider Care Teams.  These Care Teams include your primary Cardiologist (physician) and Advanced Practice Providers (APPs -  Physician Assistants and Nurse Practitioners) who all work together to provide you with the care you need, when you need it.  We recommend signing up for the patient portal called "MyChart".  Sign up information is provided on this After Visit Summary.  MyChart is used to connect with patients for Virtual Visits (Telemedicine).  Patients are able to view lab/test results, encounter notes, upcoming appointments, etc.  Non-urgent messages can be sent to your provider as well.   To learn more about what you can do with MyChart, go to [email or web address].    Your next appointment:   1 year(s)  Provider:   Augustus Mealor, MD  

## 2022-12-10 NOTE — Progress Notes (Addendum)
Cardiology Office Note:    Date:  12/10/2022   ID:  KEMUEL BUCHMANN, DOB [birth year], MRN [id number]  PCP:  Jerl Mina, MD   [facility] Providers Cardiologist:  None Electrophysiologist:  Maurice Small, MD     Referring MD: Jerl Mina, MD   Chief complaint: shortness of breath   History of Present Illness:    Jorge Peterson is a 62 y.o. male with a hx of HTN, HLD, DM, GERD, atrial fibrillation, and asymptomatic bradycardia. He is a former patient of Dr. Johney Frame.  He has had 2 A-fib ablations by Dr. Johney Frame 1 in 2012, and a second 2020.  His symptoms were fairly insidious -- shortness of breath and cough. It seems that he wasn't very aware of his AF until he had become more persistent, but at that time, he had developed mild CHF symptoms.  He was seen in EP clinic last month and noticed new onset fatigue and low O2 sats at home. A CT was performed that did not show PE or other obvious cause of his shortness of breath. TTE showed dilation of his aortic root to 4 cm. Coronary Ct showed mild obstructive disease. Heart monitor showed nocturnal bradycardia but no AF.    He returns today to review results from his 14 day monitor.  At the previous visit, I recommended a loop recorder to monitor and manage atrial fibrillation.  This was denied by his insurance company.  He began to have increased palpitations, so a 14-day patch monitor was placed.  There were multiple patient triggered events associated with PVCs.  The overall PVC burden was less than 1%  Today, he reports he is doing well and has no acute complaints.   Past Medical History:  Diagnosis Date   Atrial fibrillation    s/p afib ablation 7/12 and 11/20   Bradycardia    asymptomatic   Cataract    Mixed OU   Colonic diverticular abscess    Diabetes    Diverticulitis    Elevated LFTs    Fall    Family history of anesthesia complication    father gets pneumonia after "   GERD (gastroesophageal reflux  disease)    Hyperlipidemia    Hyperplastic colon polyp    Hypertension    Hypertensive retinopathy    OU   Internal hemorrhoids    Tinnitus    Tubular adenoma of colon    Wears glasses     Past Surgical History:  Procedure Laterality Date   ATRIAL ABLATION SURGERY  03/24/11   PVI by Fawn Kirk   ATRIAL FIBRILLATION ABLATION N/A 07/24/2019   Procedure: ATRIAL FIBRILLATION ABLATION;  Surgeon: Hillis Range, MD;  Location: [facility];  Service: Cardiovascular;  Laterality: N/A;   BACK SURGERY     BRONCHOSCOPY  01/23/2013    biopsy   CHOLECYSTECTOMY  05/02/2014   LAP CHOLE   CHOLECYSTECTOMY N/A 05/02/2014   Procedure: LAPAROSCOPIC CHOLECYSTECTOMY WITH INTRAOPERATIVE CHOLANGIOGRAM;  Surgeon: Harriette Bouillon, MD;  Location: [facility] OR;  Service: General;  Laterality: N/A;   EP IMPLANTABLE DEVICE N/A 06/27/2015   Procedure: Loop Recorder Insertion;  Surgeon: Hillis Range, MD;  Location: [facility];  Service: Cardiovascular;  Laterality: N/A;   ERCP N/A 05/25/2014   Procedure: ENDOSCOPIC RETROGRADE CHOLANGIOPANCREATOGRAPHY (ERCP);  Surgeon: Rachael Fee, MD;  Location: [facility] END[facility]COPY;  Service: Endoscopy;  Laterality: N/A;   Fistula surgery     LOOP RECORDER REMOVAL N/A 07/24/2019   Procedure: LOOP  RECORDER REMOVAL;  Surgeon: Hillis Range, MD;  Location: [facility];  Service: Cardiovascular;  Laterality: N/A;   MEDIASTINOSCOPY N/A 01/23/2013   Procedure: MEDIASTINOSCOPY;  Surgeon: Kerin Perna, MD;  [facility];  Service: Thoracic;  Laterality: N/A;   QUADRICEPS TENDON REPAIR Right 01/13/2016   Procedure: REPAIR QUADRICEP TENDON;  Surgeon: Ranee Gosselin, MD;  Location: [facility] ORS;  Service: Orthopedics;  Laterality: Right;  Received femoral nerve block in holding   SHOULDER SURGERY     x 2   TONSILLECTOMY     VIDEO BRONCHOSCOPY WITH ENDOBRONCHIAL ULTRASOUND N/A 01/23/2013   Procedure: VIDEO BRONCHOSCOPY WITH ENDOBRONCHIAL ULTRASOUND;  Surgeon: Kerin Perna, MD;  Location:  [facility] OR;  Service: Thoracic;  Laterality: N/A;    Current Medications: Current Meds  Medication Sig   acetaminophen (TYLENOL) 500 MG tablet Take 2 tablets (1,000 mg total) by mouth every 6 (six) hours as needed.   albuterol (VENTOLIN HFA) 108 (90 Base) MCG/ACT inhaler INHALE 2 PUFFS BY MOUTH EVERY 6 HOURS AS NEEDED FOR WHEEZE   amoxicillin-clavulanate (AUGMENTIN) 875-125 MG tablet Take 1 tablet by mouth 2 (two) times daily for 7 days.   Biotin 5000 MCG TABS Take 800 mcg by mouth daily.    brimonidine-timolol (COMBIGAN) 0.2-0.5 % ophthalmic solution    ELIQUIS 5 MG TABS tablet TAKE 1 TABLET(5 MG) BY MOUTH TWICE DAILY   fluticasone (FLONASE) 50 MCG/ACT nasal spray Place 1 spray into the nose daily as needed for allergies.    folic acid (FOLVITE) 400 MCG tablet Take 400 mcg by mouth daily.   glipiZIDE (GLUCOTROL XL) 5 MG 24 hr tablet Take 5 mg by mouth daily.   hydrochlorothiazide (HYDRODIURIL) 12.5 MG tablet Take 12.5 mg by mouth daily.   losartan (COZAAR) 100 MG tablet Take 100 mg by mouth every evening.    Multiple Vitamin (MULTIVITAMIN WITH MINERALS) TABS tablet Take 1 tablet by mouth daily. Centrum Silver   Omega-3 Fatty Acids (FISH OIL PO) Take 1 capsule by mouth 2 (two) times daily.   omeprazole (PRILOSEC) 40 MG capsule TAKE 1 CAPSULE BY MOUTH EVERY DAY 10-19-2022 please schedule a follow up for continued refills.   rosuvastatin (CRESTOR) 10 MG tablet TAKE 1 TABLET(10 MG) BY MOUTH DAILY   TRULICITY 1.5 MG/0.5ML SOPN Inject 1.5 mg into the skin once a week.     Allergies:   Codeine, Latex, and Oxycodone-acetaminophen   Social History   Socioeconomic History   Marital status: Married    Spouse name: Synetta Fail   Number of children: 3   Years of education: 16   Highest education level: Bachelor's degree (e.g., BA, AB, BS)  Occupational History   Occupation: Minister/ Assoc. Education officer, environmental    Employer: [employer]  Tobacco Use   Smoking status: Never   Smokeless tobacco: Never   Vaping Use   Vaping Use: Never used  Substance and Sexual Activity   Alcohol use: No   Drug use: No   Sexual activity: Yes  Other Topics Concern   Not on file  Social History Narrative   Lives in [place].  3 children are all healthy.   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   No siblings with CAD.   Social Determinants of Health   Financial Resource Strain: Low Risk  (05/11/2018)   Overall Financial Resource Strain (CARDIA)    Difficulty of Paying Living Expenses: Not hard at all  Food Insecurity: No Food Insecurity (05/11/2018)   Hunger Vital Sign  Worried About Programme researcher, broadcasting/film/video in the Last Year: Never true    Ran Out of Food in the Last Year: Never true  Transportation Needs: No Transportation Needs (05/11/2018)   PRAPARE - Administrator, Civil Service (Medical): No    Lack of Transportation (Non-Medical): No  Physical Activity: Sufficiently Active (05/11/2018)   Exercise Vital Sign    Days of Exercise per Week: 4 days    Minutes of Exercise per Session: 120 min  Stress: No Stress Concern Present (05/11/2018)   [employer] of Occupational Health - Occupational Stress Questionnaire    Feeling of Stress : Not at all  Social Connections: Moderately Integrated (05/11/2018)   Social Connection and Isolation Panel [NHANES]    Frequency of Communication with Friends and Family: More than three times a week    Frequency of Social Gatherings with Friends and Family: More than three times a week    Attends Religious Services: More than 4 times per year    Active Member of [employer] or Organizations: No    Attends Banker Meetings: Never    Marital Status: Married     Family History: The patient's family history includes Diabetes in his father; Heart disease in his father; Liver disease in his mother. There is no history of Colon cancer, Stomach cancer, Pancreatic cancer, or Esophageal cancer.  ROS:   Please see the history of  present illness.    All other systems reviewed and are negative.  EKGs/Labs/Other Studies Reviewed:     TTE 05/13/2022 Normal LV systolic function. Grade I diastolic dysfunction.  Coronary CT: 05/26/2022 Mild obstructive CAD 25-49%  Cardiac monitor 7 days: 05/2022 HR 32-132, avg 74. Slowest rate occurred with Mobitz II block, at 10:35 PM.  Cardiac monitor: ZioPatch 14 days 3//2024 Sinus rhythm rate 30-140, avg 72  Symptoms of skipped beats and fluttering were associated with an isolated PVCs.  PVC burden was < 1%    EKG:  Last EKG results: Sinus rhythm, 1st degree AV block; voltage criteria for LVH   Recent Labs: 05/07/2022: BUN 13; Creatinine, Ser 0.99; Hemoglobin 14.8; Platelets 210; Potassium 4.3; Sodium 143          Physical Exam:    VS:  BP 120/76   Pulse 61   Ht  (1.702 m)   Wt 220 lb (99.8 kg)   SpO2 95%   BMI 34.46 kg/m     Wt Readings from Last 3 Encounters:  12/10/22 220 lb (99.8 kg)  07/08/22 220 lb (99.8 kg)  06/22/22 220 lb (99.8 kg)     GEN:  Well nourished, well developed in no acute distress CARDIAC: RRR, no murmurs, rubs, gallops RESPIRATORY:  Normal work of breathing MUSCULOSKELETAL: no edema    ASSESSMENT & PLAN:    Atrial fibrillation: s/p PVI 2012, 2020. Has been on flecainide in the past. Doing well without symptoms of recurrence. However, he was largely asymptomatic when paroxysmal in the past. Loop recorder for AF management was denied by insurance. Palpitations: associated with rare PVCs on monitor. Reassured Bradycardia: occurs nocturnally on recent monitor. He had a sleep evaluation recently that did not show significant OSA. Dyspnea, mild hypoxia: appears to be resolving. I cannot identify a cardiac -- or particularly an arrhythmic cause         Medication Adjustments/Labs and Tests Ordered: Current medicines are reviewed at length with the patient today.  Concerns regarding medicines are outlined above.  No orders of  the  defined types were placed in this encounter.  No orders of the defined types were placed in this encounter.    Signed, Maurice Small, MD  12/10/2022 11:02 AM    [facility]

## 2022-12-21 ENCOUNTER — Ambulatory Visit (INDEPENDENT_AMBULATORY_CARE_PROVIDER_SITE_OTHER): Payer: 59 | Admitting: Physician Assistant

## 2022-12-21 ENCOUNTER — Telehealth: Payer: Self-pay

## 2022-12-21 ENCOUNTER — Encounter: Payer: Self-pay | Admitting: Physician Assistant

## 2022-12-21 VITALS — BP 124/78 | HR 68 | Ht 66.0 in | Wt 221.1 lb

## 2022-12-21 DIAGNOSIS — R140 Abdominal distension (gaseous): Secondary | ICD-10-CM | POA: Diagnosis not present

## 2022-12-21 DIAGNOSIS — R1013 Epigastric pain: Secondary | ICD-10-CM

## 2022-12-21 DIAGNOSIS — R194 Change in bowel habit: Secondary | ICD-10-CM | POA: Diagnosis not present

## 2022-12-21 DIAGNOSIS — K219 Gastro-esophageal reflux disease without esophagitis: Secondary | ICD-10-CM | POA: Diagnosis not present

## 2022-12-21 DIAGNOSIS — R143 Flatulence: Secondary | ICD-10-CM

## 2022-12-21 MED ORDER — NA SULFATE-K SULFATE-MG SULF 17.5-3.13-1.6 GM/177ML PO SOLN: 1.0000 | ORAL | 0 refills | Status: DC

## 2022-12-21 NOTE — Telephone Encounter (Signed)
[facility] Pre-operative Risk Assessment     Request for surgical clearance:     Endoscopy Procedure  What type of surgery is being performed?     EGD & Colonoscopy  When is this surgery scheduled?     02/08/2023  What type of clearance is required ?   Pharmacy  Are there any medications that need to be held prior to surgery and how long? Eliquis, 2 days  Practice name and name of physician performing surgery?      [facility]  What is your office phone and fax number?      Phone- [phone]  Fax- [phone]  Anesthesia type (None, local, MAC, general) ?       MAC

## 2022-12-21 NOTE — Telephone Encounter (Signed)
Dr. Nelly Laurence, pt's chart reviewed for preoperative cardiac evaluation.  Patient was seen by you on 12/10/22 and according to protocol we request that you comment on cardiac risk prior to upcoming  EGD and colonoscopy since office visit was less than 2 months ago.    Please route your response to p cv div preop.  Thank you, Marcelino Duster

## 2022-12-21 NOTE — Patient Instructions (Signed)
You have been scheduled for an endoscopy and colonoscopy. Please follow the written instructions given to you at your visit today. Please pick up your prep supplies at the pharmacy within the next 1-3 days. If you use inhalers (even only as needed), please bring them with you on the day of your procedure.  You will be contaced by our office prior to your procedure for directions on holding your Eliquis.  If you do not hear from our office 1 week prior to your scheduled procedure, please call [phone] to discuss. Ask for PJ, CMA  ___[phone]___________________________________________  If your blood pressure at your visit was 140/90 or greater, please contact your primary care physician to follow up on this.  _______________________________________________________  If you are age 70 or older, your body mass index should be between 23-30. Your Body mass index is 35.69 kg/m. If this is out of the aforementioned range listed, please consider follow up with your Primary Care Provider.  If you are age 28 or younger, your body mass index should be between 19-25. Your Body mass index is 35.69 kg/m. If this is out of the aformentioned range listed, please consider follow up with your Primary Care Provider.   ________________________________________________________  The [facility] providers would like to encourage you to use [facility] to communicate with providers for non-urgent requests or questions.  Due to long hold times on the telephone, sending your provider a message by [facility] may be a faster and more efficient way to get a response.  Please allow 48 business hours for a response.  Please remember that this is for non-urgent requests.  _______________________________________________________  I appreciate the opportunity to care for you. Hyacinth Meeker, PA-C

## 2022-12-21 NOTE — Progress Notes (Signed)
Addendum: Reviewed and agree with assessment and management plan. Dionel Archey M, MD  

## 2022-12-21 NOTE — Progress Notes (Signed)
Chief Complaint: Gas, bloating and change in bowel habits  HPI:    Jorge Peterson is a 62 year old male with a past medical history of A-fib status post ablation, GERD, diverticulitis status post sigmoid resection November 2019, and multiple others, known to Dr. Rhea Belton, who was referred to me by Jerl Mina, MD for a complaint of gas, bloating and change in bowel habits.      07/09/2021 patient seen in clinic by Dr. Rhea Belton.  At that time following up for loose stools with lower abdominal pain and altered bowel habit.  His CT scan was unremarkable.  He had tried Metamucil but still had episodic explosive stools.  He then stopped metformin and his symptoms resolved.  He did still have issues with gas and was trying Beano.  He had been changed to Ozempic.  GERD well-controlled on Meprazole 40 daily.  Discussed surveillance colonoscopy in November 2024.  He was continued on Beano/Gas-X and told to try Align.    11/16/2021 recommendations for SIBO breath test and GI path panel as well as fecal calprotectin and fecal elastase and discuss colonoscopy.    03/08/2022 patient treated with Cipro 500 twice daily x 7 days.  Had a history of Yersinia enteritis and recurrent symptoms.    11/11/2022 patient called in with symptoms and was told he had probable recurrent SIBO and treated with Cipro 500 twice daily x 7 days.    11/12/2022 patient's prescription was changed to Augmentin twice daily x 7 days.    12/10/2022 patient seen in clinic by cardiology for A-fib.    Today, patient presents to clinic and tells me that he is troubled by continued symptoms of gas and bloating and belching.  Tells me he has been treated with 2 different rounds of Augmentin and after 2 to 3 days of taking this antibiotic he feels better but then about a week or 2 after stopping and all of his symptoms started to come back like currently.  Tells me he gets bloated and has terrible gas that smells "awful", and feels like his stool is looser  and smaller.  He feels like he does not completely empty and is full of belching.  Does admit to drinking sodas and chewing gum and eating a lot of meat.  He wonders if it has anything to do with his diet and/or previous bowel surgery.    Denies fever, chills, weight loss, blood in his stool, nausea or vomiting.  Past Medical History:  Diagnosis Date   Atrial fibrillation    s/p afib ablation 7/12 and 11/20   Bradycardia    asymptomatic   Cataract    Mixed OU   Colonic diverticular abscess    Diabetes    Diverticulitis    Elevated LFTs    Fall    Family history of anesthesia complication    father gets pneumonia after "   GERD (gastroesophageal reflux disease)    Hyperlipidemia    Hyperplastic colon polyp    Hypertension    Hypertensive retinopathy    OU   Internal hemorrhoids    Tinnitus    Tubular adenoma of colon    Wears glasses     Past Surgical History:  Procedure Laterality Date   ATRIAL ABLATION SURGERY  03/24/11   PVI by Fawn Kirk   ATRIAL FIBRILLATION ABLATION N/A 07/24/2019   Procedure: ATRIAL FIBRILLATION ABLATION;  Surgeon: Hillis Range, MD;  Location: [facility];  Service: Cardiovascular;  Laterality: N/A;   BACK SURGERY  BRONCHOSCOPY  01/23/2013    biopsy   CHOLECYSTECTOMY  05/02/2014   LAP CHOLE   CHOLECYSTECTOMY N/A 05/02/2014   Procedure: LAPAROSCOPIC CHOLECYSTECTOMY WITH INTRAOPERATIVE CHOLANGIOGRAM;  Surgeon: Harriette Bouillon, MD;  Location: [facility] OR;  Service: General;  Laterality: N/A;   EP IMPLANTABLE DEVICE N/A 06/27/2015   Procedure: Loop Recorder Insertion;  Surgeon: Hillis Range, MD;  Location: [facility];  Service: Cardiovascular;  Laterality: N/A;   ERCP N/A 05/25/2014   Procedure: ENDOSCOPIC RETROGRADE CHOLANGIOPANCREATOGRAPHY (ERCP);  Surgeon: Rachael Fee, MD;  Location: [facility] ENDOSCOPY;  Service: Endoscopy;  Laterality: N/A;   Fistula surgery     LOOP RECORDER REMOVAL N/A 07/24/2019   Procedure: LOOP RECORDER REMOVAL;  Surgeon:  Hillis Range, MD;  Location: [facility];  Service: Cardiovascular;  Laterality: N/A;   MEDIASTINOSCOPY N/A 01/23/2013   Procedure: MEDIASTINOSCOPY;  Surgeon: Kerin Perna, MD;  Location: [facility] OR;  Service: Thoracic;  Laterality: N/A;   QUADRICEPS TENDON REPAIR Right 01/13/2016   Procedure: REPAIR QUADRICEP TENDON;  Surgeon: Ranee Gosselin, MD;  Location: [facility] ORS;  Service: Orthopedics;  Laterality: Right;  Received femoral nerve block in holding   SHOULDER SURGERY     x 2   TONSILLECTOMY     VIDEO BRONCHOSCOPY WITH ENDOBRONCHIAL ULTRASOUND N/A 01/23/2013   Procedure: VIDEO BRONCHOSCOPY WITH ENDOBRONCHIAL ULTRASOUND;  Surgeon: Kerin Perna, MD;  Location: [facility] OR;  Service: Thoracic;  Laterality: N/A;    Current Outpatient Medications  Medication Sig Dispense Refill   acetaminophen (TYLENOL) 500 MG tablet Take 2 tablets (1,000 mg total) by mouth every 6 (six) hours as needed. 30 tablet 0   albuterol (VENTOLIN HFA) 108 (90 Base) MCG/ACT inhaler INHALE 2 PUFFS BY MOUTH EVERY 6 HOURS AS NEEDED FOR WHEEZE     Biotin 5000 MCG TABS Take 800 mcg by mouth daily.      brimonidine-timolol (COMBIGAN) 0.2-0.5 % ophthalmic solution      budesonide-formoterol (SYMBICORT) 160-4.5 MCG/ACT inhaler Inhale into the lungs.     ELIQUIS 5 MG TABS tablet TAKE 1 TABLET(5 MG) BY MOUTH TWICE DAILY 60 tablet 5   fluticasone (FLONASE) 50 MCG/ACT nasal spray Place 1 spray into the nose daily as needed for allergies.      folic acid (FOLVITE) 400 MCG tablet Take 400 mcg by mouth daily.     glipiZIDE (GLUCOTROL XL) 5 MG 24 hr tablet Take 5 mg by mouth daily.     hydrochlorothiazide (HYDRODIURIL) 12.5 MG tablet Take 12.5 mg by mouth daily.  3   losartan (COZAAR) 100 MG tablet Take 100 mg by mouth every evening.   3   Multiple Vitamin (MULTIVITAMIN WITH MINERALS) TABS tablet Take 1 tablet by mouth daily. Centrum Silver     Omega-3 Fatty Acids (FISH OIL PO) Take 1 capsule by mouth 2 (two) times daily.     omeprazole  (PRILOSEC) 40 MG capsule TAKE 1 CAPSULE BY MOUTH EVERY DAY 10-19-2022 please schedule a follow up for continued refills. 90 capsule 0   rosuvastatin (CRESTOR) 10 MG tablet TAKE 1 TABLET(10 MG) BY MOUTH DAILY 90 tablet 3   TRULICITY 1.5 MG/0.5ML SOPN Inject 1.5 mg into the skin once a week.     No current facility-administered medications for this visit.    Allergies as of 12/21/2022 - Review Complete 12/21/2022  Allergen Reaction Noted   Codeine Itching and Rash 04/13/2014   Latex Itching and Rash 01/09/2016   Oxycodone-acetaminophen Itching and Rash 01/25/2013    Family History  Problem Relation Age of Onset   Liver disease Mother        fatty liver   Diabetes Father    Heart disease Father        CABG at 52   Colon cancer Neg Hx    Stomach cancer Neg Hx    Pancreatic cancer Neg Hx    Esophageal cancer Neg Hx     Social History   Socioeconomic History   Marital status: Married    Spouse name: Synetta Fail   Number of children: 3   Years of education: 16   Highest education level: Bachelor's degree (e.g., BA, AB, BS)  Occupational History   Occupation: Minister/ Assoc. Education officer, environmental    Employer: [employer]  Tobacco Use   Smoking status: Never   Smokeless tobacco: Never  Vaping Use   Vaping Use: Never used  Substance and Sexual Activity   Alcohol use: No   Drug use: No   Sexual activity: Yes  Other Topics Concern   Not on file  Social History Narrative   Lives in [place].  3 children are all healthy.   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   No siblings with CAD.   Social Determinants of Health   Financial Resource Strain: Low Risk  (05/11/2018)   Overall Financial Resource Strain (CARDIA)    Difficulty of Paying Living Expenses: Not hard at all  Food Insecurity: No Food Insecurity (05/11/2018)   Hunger Vital Sign    Worried About Running Out of Food in the Last Year: Never true    Ran Out of Food in the Last Year: Never true   Transportation Needs: No Transportation Needs (05/11/2018)   PRAPARE - Administrator, Civil Service (Medical): No    Lack of Transportation (Non-Medical): No  Physical Activity: Sufficiently Active (05/11/2018)   Exercise Vital Sign    Days of Exercise per Week: 4 days    Minutes of Exercise per Session: 120 min  Stress: No Stress Concern Present (05/11/2018)   [employer] of Occupational Health - Occupational Stress Questionnaire    Feeling of Stress : Not at all  Social Connections: Moderately Integrated (05/11/2018)   Social Connection and Isolation Panel [NHANES]    Frequency of Communication with Friends and Family: More than three times a week    Frequency of Social Gatherings with Friends and Family: More than three times a week    Attends Religious Services: More than 4 times per year    Active Member of [employer] or Organizations: No    Attends Banker Meetings: Never    Marital Status: Married  Catering manager Violence: Not At Risk (05/11/2018)   Humiliation, Afraid, Rape, and Kick questionnaire    Fear of Current or Ex-Partner: No    Emotionally Abused: No    Physically Abused: No    Sexually Abused: No    Review of Systems:    Constitutional: No weight loss, fever or chills Cardiovascular: No chest pain Respiratory: No SOB  Gastrointestinal: See HPI and otherwise negative   Physical Exam:  Vital signs: BP 124/78 (BP Location: Left Arm, Patient Position: Sitting, Cuff Size: Large)   Pulse 68   Ht 5\' 6"  (1.676 m)   Wt 221 lb 2 oz (100.3 kg)   BMI 35.69 kg/m    Constitutional:   Pleasant overweight Caucasian male appears to be in NAD, Well developed, Well nourished, alert and cooperative Respiratory: Respirations even and unlabored. Lungs clear  to auscultation bilaterally.   No wheezes, crackles, or rhonchi.  Cardiovascular: Normal S1, S2. No MRG. Regular rate and rhythm. No peripheral edema, cyanosis or pallor.  Gastrointestinal:  Soft,  nondistended, nontender. No rebound or guarding. Increased BS all four quadrants, No appreciable masses or hepatomegaly. Rectal:  Not performed.  Psychiatric: Demonstrates good judgement and reason without abnormal affect or behaviors.  RELEVANT LABS AND IMAGING: CBC    Component Value Date/Time   WBC 7.8 05/07/2022 1207   WBC 7.3 07/20/2019 1012   RBC 5.46 05/07/2022 1207   RBC 5.17 07/20/2019 1012   HGB 14.8 05/07/2022 1207   HCT 46.3 05/07/2022 1207   PLT 210 05/07/2022 1207   MCV 85 05/07/2022 1207   MCV 85 04/30/2014 1124   MCH 27.1 05/07/2022 1207   MCH 27.5 07/20/2019 1012   MCHC 32.0 05/07/2022 1207   MCHC 34.2 07/20/2019 1012   RDW 12.9 05/07/2022 1207   RDW 13.0 04/30/2014 1124   LYMPHSABS 2.1 07/20/2019 1012   MONOABS 0.5 07/20/2019 1012   EOSABS 0.1 07/20/2019 1012   BASOSABS 0.0 07/20/2019 1012    CMP     Component Value Date/Time   NA 143 05/07/2022 1207   NA 140 04/30/2014 1124   K 4.3 05/07/2022 1207   K 3.8 04/30/2014 1124   CL 100 05/07/2022 1207   CL 106 04/30/2014 1124   CO2 28 05/07/2022 1207   CO2 28 04/30/2014 1124   GLUCOSE 120 (H) 05/07/2022 1207   GLUCOSE 148 (H) 07/20/2019 1012   GLUCOSE 122 (H) 04/30/2014 1124   BUN 13 05/07/2022 1207   BUN 28 (H) 04/30/2014 1124   CREATININE 0.99 05/07/2022 1207   CREATININE 1.38 (H) 04/30/2014 1124   CALCIUM 9.8 05/07/2022 1207   CALCIUM 9.1 04/30/2014 1124   PROT 6.5 05/12/2018 0508   PROT 6.9 04/30/2014 1124   ALBUMIN 3.2 (L) 05/12/2018 0508   ALBUMIN 4.0 04/30/2014 1124   AST 19 05/12/2018 0508   AST 34 04/30/2014 1124   ALT 20 05/12/2018 0508   ALT 53 04/30/2014 1124   ALKPHOS 67 05/12/2018 0508   ALKPHOS 87 04/30/2014 1124   BILITOT 0.9 05/12/2018 0508   BILITOT 0.5 04/30/2014 1124   GFRNONAA >60 07/20/2019 1012   GFRNONAA 58 (L) 04/30/2014 1124   GFRAA >60 07/20/2019 1012   GFRAA >60 04/30/2014 1124    Assessment: 1.  Bloating and gas: Patient has been treated 2-3 times now for  suspected SIBO after a Yersinia enteritis and symptoms resolved for a couple of days while on antibiotics but then slowly started to come back; consider continued SIBO versus relation to partial colectomy versus diet 2.  Change in bowel habits: Towards softer pieces of stool; consider relation to above/below 3.  History of diverticulitis status post partial colectomy 4.  History of adenomatous polyps: Repeat colonoscopy for surveillance due in November  Plan: 1.  Scheduled patient for diagnostic EGD and colonoscopy with Dr. Rhea Belton in the [facility].  Did provide the patient a detailed list risks for the procedures and he agrees to proceed. Patient is appropriate for endoscopic procedure(s) in the ambulatory ([facility]) setting.  2.  Discussed SIBO with the patient and answered all of his questions. 3.  Provided the patient with a low gas producing diet handout 4.  Patient was advised to hold his Eliquis for 2 days prior to time of procedure.  We will communicate with his prescribing physician to ensure this is acceptable for him. 5.  Patient to follow in clinic per recommendations from Dr. Rhea Belton after time of procedures.  Hyacinth Meeker, PA-C [facility] 12/21/2022, 9:01 AM  Cc: Jerl Mina, MD

## 2022-12-22 NOTE — Telephone Encounter (Signed)
   Patient Name: Jorge Peterson  DOB: July 21, 1961 MRN: [id number]  Primary Cardiologist: None  Chart reviewed as part of pre-operative protocol coverage. Given past medical history and time since last visit, based on ACC/AHA guidelines, [name] is at acceptable risk for the planned procedure without further cardiovascular testing.   Per Dr. Nelly Laurence, pt may hold Eliquis for 2 days prior to procedure. Please resume Eliquis as soon as possible postprocedure, at the discretion of the surgeon.   I will route this recommendation to the requesting party via Epic fax function and remove from pre-op pool.  Please call with questions.  Joylene Grapes, NP 12/22/2022, 11:18 AM

## 2023-01-03 NOTE — Telephone Encounter (Signed)
I have spoken to patient to advise that per cardiology, he may hold eliquis two days prior to his upcoming procedure with Dr Rhea Belton on 02/08/23. Patient verbalizes understanding of this information.

## 2023-01-03 NOTE — Telephone Encounter (Signed)
Left message for patient to call back  

## 2023-01-04 ENCOUNTER — Encounter: Payer: Self-pay | Admitting: Internal Medicine

## 2023-01-07 NOTE — Telephone Encounter (Signed)
Give patient topical diltiazem 2% I believe he is referring to a fissure 1 pea-sized amount to the anal canal inserted to the first knuckle 2-3 times daily Use RectiCare over-the-counter per box instruction for pain control

## 2023-02-02 ENCOUNTER — Other Ambulatory Visit: Payer: Self-pay | Admitting: Internal Medicine

## 2023-02-03 ENCOUNTER — Other Ambulatory Visit: Payer: Self-pay

## 2023-02-03 MED ORDER — OMEPRAZOLE 40 MG PO CPDR
DELAYED_RELEASE_CAPSULE | ORAL | 0 refills | Status: DC
Start: 2023-02-03 — End: 2023-05-09

## 2023-02-03 MED ORDER — OMEPRAZOLE 40 MG PO CPDR: DELAYED_RELEASE_CAPSULE | ORAL | 0 refills | Status: DC

## 2023-02-08 ENCOUNTER — Other Ambulatory Visit: Payer: 59

## 2023-02-08 ENCOUNTER — Encounter: Payer: Self-pay | Admitting: Internal Medicine

## 2023-02-08 ENCOUNTER — Ambulatory Visit (AMBULATORY_SURGERY_CENTER): Payer: 59 | Admitting: Internal Medicine

## 2023-02-08 ENCOUNTER — Other Ambulatory Visit: Payer: Self-pay | Admitting: Internal Medicine

## 2023-02-08 VITALS — BP 116/66 | HR 63 | Temp 98.1°F | Resp 16 | Ht 66.0 in | Wt 221.0 lb

## 2023-02-08 DIAGNOSIS — R194 Change in bowel habit: Secondary | ICD-10-CM

## 2023-02-08 DIAGNOSIS — Z860100 Personal history of colon polyps, unspecified: Secondary | ICD-10-CM

## 2023-02-08 DIAGNOSIS — R140 Abdominal distension (gaseous): Secondary | ICD-10-CM | POA: Diagnosis not present

## 2023-02-08 DIAGNOSIS — Z09 Encounter for follow-up examination after completed treatment for conditions other than malignant neoplasm: Secondary | ICD-10-CM

## 2023-02-08 MED ORDER — SODIUM CHLORIDE 0.9 % IV SOLN
500.0000 mL | Freq: Once | INTRAVENOUS | Status: DC
Start: 2023-02-08 — End: 2023-02-08

## 2023-02-08 NOTE — Patient Instructions (Addendum)
- Resume previous diet. - Continue present medications. - Await pathology results. - See the other procedure note for documentation of additional recommendations. - Resume Eliquis (apixaban) at prior dose today.  - Refer to managing physician for further adjustment of therapy. - Await pathology results. - Repeat colonoscopy in 5 years for surveillance.  YOU HAD AN ENDOSCOPIC PROCEDURE TODAY AT THE [facility]:   Refer to the procedure report that was given to you for any specific questions about what was found during the examination.  If the procedure report does not answer your questions, please call your gastroenterologist to clarify.  If you requested that your care partner not be given the details of your procedure findings, then the procedure report has been included in a sealed envelope for you to review at your convenience later.  YOU SHOULD EXPECT: Some feelings of bloating in the abdomen. Passage of more gas than usual.  Walking can help get rid of the air that was put into your GI tract during the procedure and reduce the bloating. If you had a lower endoscopy (such as a colonoscopy or flexible sigmoidoscopy) you may notice spotting of blood in your stool or on the toilet paper. If you underwent a bowel prep for your procedure, you may not have a normal bowel movement for a few days.  Please Note:  You might notice some irritation and congestion in your nose or some drainage.  This is from the oxygen used during your procedure.  There is no need for concern and it should clear up in a day or so.  SYMPTOMS TO REPORT IMMEDIATELY:  Following lower endoscopy (colonoscopy or flexible sigmoidoscopy):  Excessive amounts of blood in the stool  Significant tenderness or worsening of abdominal pains  Swelling of the abdomen that is new, acute  Fever of 100F or higher  Following upper endoscopy (EGD)  Vomiting of blood or coffee ground material  New chest pain or pain under the  shoulder blades  Painful or persistently difficult swallowing  New shortness of breath  Fever of 100F or higher  Black, tarry-looking stools  For urgent or emergent issues, a gastroenterologist can be reached at any hour by calling [phone]. Do not use MyChart messaging for urgent concerns.    DIET:  We do recommend a small meal at first, but then you may proceed to your regular diet.  Drink plenty of fluids but you should avoid alcoholic beverages for 24 hours.  ACTIVITY:  You should plan to take it easy for the rest of today and you should NOT DRIVE or use heavy machinery until tomorrow (because of the sedation medicines used during the test).    FOLLOW UP: Our staff will call the number listed on your records the next business day following your procedure.  We will call around 7:15- 8:00 am to check on you and address any questions or concerns that you may have regarding the information given to you following your procedure. If we do not reach you, we will leave a message.     If any biopsies were taken you will be contacted by phone or by letter within the next 1-3 weeks.  Please call us at [phone] if you have not heard about the biopsies in 3 weeks.    SIGNATURES/CONFIDENTIALITY: You and/or your care partner have signed paperwork which will be entered into your electronic medical record.  These signatures attest to the fact that that the information above on your After  Visit Summary has been reviewed and is understood.  Full responsibility of the confidentiality of this discharge information lies with you and/or your care-partner.

## 2023-02-08 NOTE — Progress Notes (Signed)
Sedate, gd SR, tolerated procedure well, VSS, report to RN 

## 2023-02-08 NOTE — Op Note (Signed)
[facility] Patient Name: Jorge Peterson Procedure Date: 02/08/2023 1:37 PM MRN: [id number] Endoscopist: Beverley Fiedler , MD, [id number] Age: 62 Referring MD:  Date of Birth: 05/12/1961 Gender: Male Account #: [id number] Procedure:                Colonoscopy Indications:              High risk colon cancer surveillance: Personal                            history of adenoma with villous component, Last                            colonoscopy: November 2019 (2 small adenomas),                            incidental abdominal bloating Medicines:                Monitored Anesthesia Care Procedure:                Pre-Anesthesia Assessment:                           - Prior to the procedure, a History and Physical                            was performed, and patient medications and                            allergies were reviewed. The patient's tolerance of                            previous anesthesia was also reviewed. The risks                            and benefits of the procedure and the sedation                            options and risks were discussed with the patient.                            All questions were answered, and informed consent                            was obtained. Prior Anticoagulants: The patient has                            taken Eliquis (apixaban), last dose was 2 days                            prior to procedure. ASA Grade Assessment: II - A                            patient with mild systemic disease. After reviewing  the risks and benefits, the patient was deemed in                            satisfactory condition to undergo the procedure.                           After obtaining informed consent, the colonoscope                            was passed under direct vision. Throughout the                            procedure, the patient's blood pressure, pulse, and                            oxygen saturations  were monitored continuously. The                            Olympus CF-[id number] SN [id number] was introduced through                            the anus and advanced to the terminal ileum. The                            colonoscopy was performed without difficulty. The                            patient tolerated the procedure well. The quality                            of the bowel preparation was good. The terminal                            ileum, ileocecal valve, appendiceal orifice, and                            rectum were photographed. Scope In: 1:59:48 PM Scope Out: 2:12:16 PM Scope Withdrawal Time: 0 hours 10 minutes 27 seconds  Total Procedure Duration: 0 hours 12 minutes 28 seconds  Findings:                 The digital rectal exam was normal.                           The terminal ileum appeared normal.                           There was evidence of a prior end-to-end                            colo-colonic anastomosis in the sigmoid colon. This                            was patent and was characterized by healthy  appearing mucosa.                           Multiple medium-mouthed and small-mouthed                            diverticula were found in the sigmoid colon,                            descending colon and transverse colon.                           The exam was otherwise without abnormality on                            direct and retroflexion views. Complications:            No immediate complications. Estimated Blood Loss:     Estimated blood loss: none. Impression:               - The examined portion of the ileum was normal.                           - Patent end-to-end colo-colonic anastomosis,                            characterized by healthy appearing mucosa.                           - Moderate diverticulosis in the sigmoid colon, in                            the descending colon and in the transverse colon.                            - The examination was otherwise normal on direct                            and retroflexion views.                           - No specimens collected. Recommendation:           - Patient has a contact number available for                            emergencies. The signs and symptoms of potential                            delayed complications were discussed with the                            patient. Return to normal activities tomorrow.                            Written discharge instructions were provided to the  patient.                           - Resume previous diet.                           - Continue present medications.                           - Resume Eliquis (apixaban) at prior dose today.                            Refer to managing physician for further adjustment                            of therapy.                           - Await pathology results.                           - Repeat colonoscopy in 5 years for surveillance. Beverley Fiedler, MD 02/08/2023 2:27:52 PM This report has been signed electronically.

## 2023-02-08 NOTE — Op Note (Signed)
[facility] Patient Name: Jorge Peterson Procedure Date: 02/08/2023 1:42 PM MRN: [id number] Endoscopist: Beverley Fiedler , MD, [id number] Age: 62 Referring MD:  Date of Birth: [birth year] Gender: Male Account #: [id number] Procedure:                Upper GI endoscopy Indications:              Abdominal bloating, change in bowel habits Medicines:                Monitored Anesthesia Care Procedure:                Pre-Anesthesia Assessment:                           - Prior to the procedure, a History and Physical                            was performed, and patient medications and                            allergies were reviewed. The patient's tolerance of                            previous anesthesia was also reviewed. The risks                            and benefits of the procedure and the sedation                            options and risks were discussed with the patient.                            All questions were answered, and informed consent                            was obtained. Prior Anticoagulants: The patient has                            taken Eliquis (apixaban), last dose was 2 days                            prior to procedure. ASA Grade Assessment: II - A                            patient with mild systemic disease. After reviewing                            the risks and benefits, the patient was deemed in                            satisfactory condition to undergo the procedure.                           After obtaining informed consent, the endoscope was  passed under direct vision. Throughout the                            procedure, the patient's blood pressure, pulse, and                            oxygen saturations were monitored continuously. The                            GIF [id number] #[id number] was introduced through the                            mouth, and advanced to the second part of duodenum.                             The upper GI endoscopy was accomplished without                            difficulty. The patient tolerated the procedure                            well. Scope In: Scope Out: Findings:                 The examined esophagus was normal.                           A 2 cm hiatal hernia was present.                           The entire examined stomach was normal. Biopsies                            were taken with a cold forceps for histology and                            Helicobacter pylori testing.                           A small diverticulum was found in the second                            portion of the duodenum.                           The exam of the duodenum was otherwise normal.                           Biopsies for histology were taken with a cold                            forceps in the second portion of the duodenum for                            evaluation of celiac disease. Complications:  No immediate complications. Estimated Blood Loss:     Estimated blood loss: none. Impression:               - Normal esophagus.                           - 2 cm hiatal hernia.                           - Normal stomach. Biopsied.                           - Duodenal diverticulum.                           - Duodenal mucosa normal.                           - Biopsies were taken with a cold forceps for                            evaluation of celiac disease. Recommendation:           - Patient has a contact number available for                            emergencies. The signs and symptoms of potential                            delayed complications were discussed with the                            patient. Return to normal activities tomorrow.                            Written discharge instructions were provided to the                            patient.                           - Resume previous diet.                           - Continue present  medications.                           - Await pathology results.                           - See the other procedure note for documentation of                            additional recommendations. Beverley Fiedler, MD 02/08/2023 2:24:08 PM This report has been signed electronically.

## 2023-02-08 NOTE — Progress Notes (Signed)
GASTROENTEROLOGY PROCEDURE H&P NOTE   Primary Care Physician: Jerl Mina, MD    Reason for Procedure:  Bloating, flatulence, change in bowel habits, prior diverticulitis and partial colectomy, history of adenomatous colon polyps  Plan:    EGD and colonoscopy  Patient is appropriate for endoscopic procedure(s) in the ambulatory ([facility]) setting.  The nature of the procedure, as well as the risks, benefits, and alternatives were carefully and thoroughly reviewed with the patient. Ample time for discussion and questions allowed. The patient understood, was satisfied, and agreed to proceed.     HPI: Jorge Peterson is a 62 y.o. male who presents for EGD and colonoscopy.  Medical history as below.  Tolerated the prep.  No recent chest pain or shortness of breath.  No abdominal pain today.  Eliquis on hold x 2 days.  Past Medical History:  Diagnosis Date   AAA (abdominal aortic aneurysm) (HCC)    Atrial fibrillation (HCC)    s/p afib ablation 7/12 and 11/20   Bradycardia    asymptomatic   Colonic diverticular abscess    Diabetes (HCC)    Diverticulitis    Elevated LFTs    Fall    Family history of anesthesia complication    father gets pneumonia after "   GERD (gastroesophageal reflux disease)    Hyperlipidemia    Hyperplastic colon polyp    Hypertension    Hypertensive retinopathy    OU   Internal hemorrhoids    Tinnitus    Tubular adenoma of colon    Wears glasses     Past Surgical History:  Procedure Laterality Date   ATRIAL ABLATION SURGERY  03/24/11   PVI by Fawn Kirk   ATRIAL FIBRILLATION ABLATION N/A 07/24/2019   Procedure: ATRIAL FIBRILLATION ABLATION;  Surgeon: Hillis Range, MD;  Location: [facility];  Service: Cardiovascular;  Laterality: N/A;   BACK SURGERY     BRONCHOSCOPY  01/23/2013    biopsy   CHOLECYSTECTOMY  05/02/2014   LAP CHOLE   CHOLECYSTECTOMY N/A 05/02/2014   Procedure: LAPAROSCOPIC CHOLECYSTECTOMY WITH INTRAOPERATIVE CHOLANGIOGRAM;   Surgeon: Harriette Bouillon, MD;  Location: [facility] OR;  Service: General;  Laterality: N/A;   EP IMPLANTABLE DEVICE N/A 06/27/2015   Procedure: Loop Recorder Insertion;  Surgeon: Hillis Range, MD;  Location: [facility];  Service: Cardiovascular;  Laterality: N/A;   ERCP N/A 05/25/2014   Procedure: ENDOSCOPIC RETROGRADE CHOLANGIOPANCREATOGRAPHY (ERCP);  Surgeon: Rachael Fee, MD;  Location: [facility] ENDOSCOPY;  Service: Endoscopy;  Laterality: N/A;   Fistula surgery     LOOP RECORDER REMOVAL N/A 07/24/2019   Procedure: LOOP RECORDER REMOVAL;  Surgeon: Hillis Range, MD;  Location: [facility];  Service: Cardiovascular;  Laterality: N/A;   MEDIASTINOSCOPY N/A 01/23/2013   Procedure: MEDIASTINOSCOPY;  Surgeon: Kerin Perna, MD;  Location: [facility] OR;  Service: Thoracic;  Laterality: N/A;   QUADRICEPS TENDON REPAIR Right 01/13/2016   Procedure: REPAIR QUADRICEP TENDON;  Surgeon: Ranee Gosselin, MD;  Location: [facility] ORS;  Service: Orthopedics;  Laterality: Right;  Received femoral nerve block in holding   SHOULDER SURGERY     x 2   TONSILLECTOMY     VIDEO BRONCHOSCOPY WITH ENDOBRONCHIAL ULTRASOUND N/A 01/23/2013   Procedure: VIDEO BRONCHOSCOPY WITH ENDOBRONCHIAL ULTRASOUND;  Surgeon: Kerin Perna, MD;  Location: [facility] OR;  Service: Thoracic;  Laterality: N/A;    Prior to Admission medications   Medication Sig Start Date End Date Taking? Authorizing Provider  brimonidine-timolol (COMBIGAN) 0.2-0.5 % ophthalmic solution  Yes [provider]  ELIQUIS 5 MG TABS tablet TAKE 1 TABLET(5 MG) BY MOUTH TWICE DAILY 08/11/22  Yes Mealor, Roberts Gaudy, MD  folic acid (FOLVITE) 400 MCG tablet Take 400 mcg by mouth daily.   Yes [provider]  hydrochlorothiazide (HYDRODIURIL) 25 MG tablet TAKE 1 TABLET(25 MG) BY MOUTH EVERY DAY 12/13/22  Yes [provider]  losartan (COZAAR) 100 MG tablet Take 100 mg by mouth every evening.  06/28/18  Yes [provider]  Multiple Vitamin  (MULTIVITAMIN WITH MINERALS) TABS tablet Take 1 tablet by mouth daily. Centrum Silver   Yes [provider]  Omega-3 Fatty Acids (FISH OIL PO) Take 1 capsule by mouth 2 (two) times daily.   Yes [provider]  rosuvastatin (CRESTOR) 10 MG tablet TAKE 1 TABLET(10 MG) BY MOUTH DAILY 06/16/22  Yes Allred, Fayrene Fearing, MD  traZODone (DESYREL) 50 MG tablet Take 50 mg by mouth every other day. 12/16/22  Yes [provider]  acetaminophen (TYLENOL) 500 MG tablet Take 2 tablets (1,000 mg total) by mouth every 6 (six) hours as needed. 07/14/18   Romie Levee, MD  albuterol (VENTOLIN HFA) 108 (90 Base) MCG/ACT inhaler INHALE 2 PUFFS BY MOUTH EVERY 6 HOURS AS NEEDED FOR WHEEZE 08/18/20   [provider]  Biotin 5000 MCG TABS Take 800 mcg by mouth daily.     [provider]  budesonide-formoterol (SYMBICORT) 160-4.5 MCG/ACT inhaler Inhale 2 puffs into the lungs as needed. 08/13/19 12/21/22  [provider]  fluticasone (FLONASE) 50 MCG/ACT nasal spray Place 1 spray into the nose daily as needed for allergies.     [provider]  glipiZIDE (GLUCOTROL XL) 5 MG 24 hr tablet Take 5 mg by mouth daily. Patient not taking: Reported on 02/08/2023 06/01/21   [provider]  omeprazole (PRILOSEC) 40 MG capsule TAKE 1 CAPSULE BY MOUTH EVERY DAY 02/03/23   Jissell Trafton, Carie Caddy, MD  TRULICITY 3 MG/0.5ML SOPN Inject 3 mg into the skin once a week. 12/12/22   [provider]    Current Outpatient Medications  Medication Sig Dispense Refill   brimonidine-timolol (COMBIGAN) 0.2-0.5 % ophthalmic solution      ELIQUIS 5 MG TABS tablet TAKE 1 TABLET(5 MG) BY MOUTH TWICE DAILY 60 tablet 5   folic acid (FOLVITE) 400 MCG tablet Take 400 mcg by mouth daily.     hydrochlorothiazide (HYDRODIURIL) 25 MG tablet TAKE 1 TABLET(25 MG) BY MOUTH EVERY DAY     losartan (COZAAR) 100 MG tablet Take 100 mg by mouth every evening.   3   Multiple Vitamin (MULTIVITAMIN WITH  MINERALS) TABS tablet Take 1 tablet by mouth daily. Centrum Silver     Omega-3 Fatty Acids (FISH OIL PO) Take 1 capsule by mouth 2 (two) times daily.     rosuvastatin (CRESTOR) 10 MG tablet TAKE 1 TABLET(10 MG) BY MOUTH DAILY 90 tablet 3   traZODone (DESYREL) 50 MG tablet Take 50 mg by mouth every other day.     acetaminophen (TYLENOL) 500 MG tablet Take 2 tablets (1,000 mg total) by mouth every 6 (six) hours as needed. 30 tablet 0   albuterol (VENTOLIN HFA) 108 (90 Base) MCG/ACT inhaler INHALE 2 PUFFS BY MOUTH EVERY 6 HOURS AS NEEDED FOR WHEEZE     Biotin 5000 MCG TABS Take 800 mcg by mouth daily.      budesonide-formoterol (SYMBICORT) 160-4.5 MCG/ACT inhaler Inhale 2 puffs into the lungs as needed.     fluticasone (FLONASE) 50 MCG/ACT  nasal spray Place 1 spray into the nose daily as needed for allergies.      glipiZIDE (GLUCOTROL XL) 5 MG 24 hr tablet Take 5 mg by mouth daily. (Patient not taking: Reported on 02/08/2023)     omeprazole (PRILOSEC) 40 MG capsule TAKE 1 CAPSULE BY MOUTH EVERY DAY 90 capsule 0   TRULICITY 3 MG/0.5ML SOPN Inject 3 mg into the skin once a week.     Current Facility-Administered Medications  Medication Dose Route Frequency Provider Last Rate Last Admin   0.9 %  sodium chloride infusion  500 mL Intravenous Once Jadon Harbaugh, Carie Caddy, MD        Allergies as of 02/08/2023 - Review Complete 02/08/2023  Allergen Reaction Noted   Codeine Itching and Rash 04/13/2014   Latex Itching and Rash 01/09/2016   Oxycodone-acetaminophen Itching and Rash 01/25/2013    Family History  Problem Relation Age of Onset   Liver disease Mother        fatty liver   Diabetes Father    Heart disease Father        CABG at 49   Colon cancer Neg Hx    Stomach cancer Neg Hx    Pancreatic cancer Neg Hx    Esophageal cancer Neg Hx     Social History   Socioeconomic History   Marital status: Married    Spouse name: Synetta Fail   Number of children: 3   Years of education: 16   Highest  education level: Bachelor's degree (e.g., BA, AB, BS)  Occupational History   Occupation: Minister/ Assoc. Education officer, environmental    Employer: [employer]  Tobacco Use   Smoking status: Never   Smokeless tobacco: Never  Vaping Use   Vaping Use: Never used  Substance and Sexual Activity   Alcohol use: No   Drug use: No   Sexual activity: Yes  Other Topics Concern   Not on file  Social History Narrative   Lives in [place].  3 children are all healthy.   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   No siblings with CAD.   Social Determinants of Health   Financial Resource Strain: Low Risk  (05/11/2018)   Overall Financial Resource Strain (CARDIA)    Difficulty of Paying Living Expenses: Not hard at all  Food Insecurity: No Food Insecurity (05/11/2018)   Hunger Vital Sign    Worried About Running Out of Food in the Last Year: Never true    Ran Out of Food in the Last Year: Never true  Transportation Needs: No Transportation Needs (05/11/2018)   PRAPARE - Administrator, Civil Service (Medical): No    Lack of Transportation (Non-Medical): No  Physical Activity: Sufficiently Active (05/11/2018)   Exercise Vital Sign    Days of Exercise per Week: 4 days    Minutes of Exercise per Session: 120 min  Stress: No Stress Concern Present (05/11/2018)   [employer] of Occupational Health - Occupational Stress Questionnaire    Feeling of Stress : Not at all  Social Connections: Moderately Integrated (05/11/2018)   Social Connection and Isolation Panel [NHANES]    Frequency of Communication with Friends and Family: More than three times a week    Frequency of Social Gatherings with Friends and Family: More than three times a week    Attends Religious Services: More than 4 times per year    Active Member of [employer] or Organizations: No    Attends Banker Meetings: Never  Marital Status: Married  Catering manager Violence: Not At Risk (05/11/2018)    Humiliation, Afraid, Rape, and Kick questionnaire    Fear of Current or Ex-Partner: No    Emotionally Abused: No    Physically Abused: No    Sexually Abused: No    Physical Exam: Vital signs in last 24 hours: @BP  (!) 107/55   Pulse 63   Temp 98.1 F (36.7 C)   Ht 5\' 6"  (1.676 m)   Wt 221 lb (100.2 kg)   SpO2 96%   BMI 35.67 kg/m  GEN: NAD EYE: Sclerae anicteric ENT: MMM CV: Non-tachycardic Pulm: CTA b/l GI: Soft, NT/ND NEURO:  Alert & Oriented x 3   Erick Blinks, MD [facility]  02/08/2023 1:38 PM

## 2023-02-08 NOTE — Progress Notes (Signed)
Called to room to assist during endoscopic procedure.  Patient ID and intended procedure confirmed with present staff. Received instructions for my participation in the procedure from the performing physician.  

## 2023-02-09 ENCOUNTER — Telehealth: Payer: Self-pay

## 2023-02-09 LAB — ALPHA-GAL PANEL
Allergen, Mutton, f88: 0.5 kU/L — ABNORMAL HIGH
CLASS: 1

## 2023-02-09 NOTE — Telephone Encounter (Signed)
Follow up call placed, VM obtained and message left. 

## 2023-02-12 ENCOUNTER — Other Ambulatory Visit: Payer: Self-pay | Admitting: Cardiovascular Disease

## 2023-02-12 DIAGNOSIS — I480 Paroxysmal atrial fibrillation: Secondary | ICD-10-CM

## 2023-02-14 ENCOUNTER — Encounter: Payer: Self-pay | Admitting: Cardiovascular Disease

## 2023-02-14 DIAGNOSIS — I480 Paroxysmal atrial fibrillation: Secondary | ICD-10-CM

## 2023-02-14 NOTE — Telephone Encounter (Signed)
Prescription refill request for Eliquis received. Indication:PAF Last office visit: 12/10/22  A Mealor MD Scr: 1.2 on 01/06/23  Epic Age: 62 Weight: 99.8kg  Based on above findings Eliquis 5mg  twice daily is the appropriate dose.  Refill approved.

## 2023-02-15 ENCOUNTER — Other Ambulatory Visit: Payer: Self-pay

## 2023-02-15 MED ORDER — APIXABAN 5 MG PO TABS
ORAL_TABLET | ORAL | 5 refills | Status: DC
Start: 2023-02-15 — End: 2023-09-12

## 2023-02-15 MED ORDER — TRULICITY 3 MG/0.5ML ~~LOC~~ SOAJ
3.0000 mg | SUBCUTANEOUS | 5 refills | Status: DC
  Filled 2023-02-15: qty 2, 28d supply, fill #0
  Filled 2023-03-15: qty 2, 28d supply, fill #1
  Filled 2023-04-09: qty 2, 28d supply, fill #2
  Filled 2023-05-24: qty 2, 28d supply, fill #3
  Filled 2023-06-16: qty 2, 28d supply, fill #4

## 2023-02-15 NOTE — Telephone Encounter (Signed)
Pt last saw Dr Nelly Laurence 12/10/22, last labs 01/06/23 Creat 1.2, age 62, weight 100.2kg, based on specified criteria pt is on appropriate dosage of Eliquis 5mg  BID for afib.  Will refill rx.

## 2023-02-17 ENCOUNTER — Ambulatory Visit (INDEPENDENT_AMBULATORY_CARE_PROVIDER_SITE_OTHER): Payer: 59 | Admitting: Dermatology

## 2023-02-17 ENCOUNTER — Encounter: Payer: Self-pay | Admitting: Dermatology

## 2023-02-17 VITALS — BP 118/76 | HR 72

## 2023-02-17 DIAGNOSIS — L821 Other seborrheic keratosis: Secondary | ICD-10-CM

## 2023-02-17 DIAGNOSIS — L570 Actinic keratosis: Secondary | ICD-10-CM | POA: Diagnosis not present

## 2023-02-17 DIAGNOSIS — W908XXA Exposure to other nonionizing radiation, initial encounter: Secondary | ICD-10-CM

## 2023-02-17 DIAGNOSIS — L814 Other melanin hyperpigmentation: Secondary | ICD-10-CM

## 2023-02-17 DIAGNOSIS — L578 Other skin changes due to chronic exposure to nonionizing radiation: Secondary | ICD-10-CM | POA: Diagnosis not present

## 2023-02-17 NOTE — Progress Notes (Signed)
   Follow-Up Visit   Subjective  Jorge Peterson is a 62 y.o. male who presents for the following: 6 month AK follow up. Face, scalp, ears. Used 5FU/Calcipotriene as directed on forehead and temples.   The patient has spots, moles and lesions to be evaluated, some may be new or changing and the patient has concerns that these could be cancer.    The following portions of the chart were reviewed this encounter and updated as appropriate: medications, allergies, medical history  Review of Systems:  No other skin or systemic complaints except as noted in HPI or Assessment and Plan.  Objective  Well appearing patient in no apparent distress; mood and affect are within normal limits.  A focused examination was performed of the following areas: Face, ears, neck, scalp, arms  Relevant physical exam findings are noted in the Assessment and Plan.  face x2 (2) Erythematous thin papules/macules with gritty scale.     Assessment & Plan   AK (actinic keratosis) (2) face x2  Actinic keratoses are precancerous spots that appear secondary to cumulative UV radiation exposure/sun exposure over time. They are chronic with expected duration over 1 year. A portion of actinic keratoses will progress to squamous cell carcinoma of the skin. It is not possible to reliably predict which spots will progress to skin cancer and so treatment is recommended to prevent development of skin cancer.  Recommend daily broad spectrum sunscreen SPF 30+ to sun-exposed areas, reapply every 2 hours as needed.  Recommend staying in the shade or wearing long sleeves, sun glasses (UVA+UVB protection) and wide brim hats (4-inch brim around the entire circumference of the hat). Call for new or changing lesions.  Destruction of lesion - face x2 Complexity: simple   Destruction method: cryotherapy   Informed consent: discussed and consent obtained   Timeout:  patient name, date of birth, surgical site, and procedure  verified Lesion destroyed using liquid nitrogen: Yes   Region frozen until ice ball extended beyond lesion: Yes   Outcome: patient tolerated procedure well with no complications   Post-procedure details: wound care instructions given   Additional details:  Prior to procedure, discussed risks of blister formation, small wound, skin dyspigmentation, or rare scar following cryotherapy. Recommend Vaseline ointment to treated areas while healing.   ACTINIC DAMAGE - chronic, secondary to cumulative UV radiation exposure/sun exposure over time - diffuse scaly erythematous macules with underlying dyspigmentation - Recommend daily broad spectrum sunscreen SPF 30+ to sun-exposed areas, reapply every 2 hours as needed.  - Recommend staying in the shade or wearing long sleeves, sun glasses (UVA+UVB protection) and wide brim hats (4-inch brim around the entire circumference of the hat). - Call for new or changing lesions.  LENTIGINES Exam: scattered tan macules Due to sun exposure Treatment Plan: Benign-appearing, observe. Recommend daily broad spectrum sunscreen SPF 30+ to sun-exposed areas, reapply every 2 hours as needed.  Call for any changes  SEBORRHEIC KERATOSIS - Stuck-on, waxy, tan-brown papules and/or plaques  - Benign-appearing - Discussed benign etiology and prognosis. - Observe - Call for any changes     Return for AK Follow Up, TBSE in 6-12 months .  I, Lawson Radar, CMA, am acting as scribe for Armida Sans, MD.   Documentation: I have reviewed the above documentation for accuracy and completeness, and I agree with the above.  Armida Sans, MD

## 2023-02-17 NOTE — Patient Instructions (Signed)
Cryotherapy Aftercare  Wash gently with soap and water everyday.   Apply Vaseline and Band-Aid daily until healed.    Recommend daily broad spectrum sunscreen SPF 30+ to sun-exposed areas, reapply every 2 hours as needed. Call for new or changing lesions.  Staying in the shade or wearing long sleeves, sun glasses (UVA+UVB protection) and wide brim hats (4-inch brim around the entire circumference of the hat) are also recommended for sun protection.     Due to recent changes in healthcare laws, you may see results of your pathology and/or laboratory studies on MyChart before the doctors have had a chance to review them. We understand that in some cases there may be results that are confusing or concerning to you. Please understand that not all results are received at the same time and often the doctors may need to interpret multiple results in order to provide you with the best plan of care or course of treatment. Therefore, we ask that you please give us 2 business days to thoroughly review all your results before contacting the office for clarification. Should we see a critical lab result, you will be contacted sooner.   If You Need Anything After Your Visit  If you have any questions or concerns for your doctor, please call our main line at [phone] and press option 4 to reach your doctor's medical assistant. If no one answers, please leave a voicemail as directed and we will return your call as soon as possible. Messages left after 4 pm will be answered the following business day.   You may also send us a message via MyChart. We typically respond to MyChart messages within 1-2 business days.  For prescription refills, please ask your pharmacy to contact our office. Our fax number is [phone].  If you have an urgent issue when the clinic is closed that cannot wait until the next business day, you can page your doctor at the number below.    Please note that while we do our best to  be available for urgent issues outside of office hours, we are not available 24/7.   If you have an urgent issue and are unable to reach us, you may choose to seek medical care at your doctor's office, retail clinic, urgent care center, or emergency room.  If you have a medical emergency, please immediately call 911 or go to the emergency department.  Pager Numbers  - Dr. Kowalski: [phone]  - Dr. Moye: [phone]  - Dr. Stewart: [phone]  In the event of inclement weather, please call our main line at [phone] for an update on the status of any delays or closures.  Dermatology Medication Tips: Please keep the boxes that topical medications come in in order to help keep track of the instructions about where and how to use these. Pharmacies typically print the medication instructions only on the boxes and not directly on the medication tubes.   If your medication is too expensive, please contact our office at [phone] option 4 or send us a message through MyChart.   We are unable to tell what your co-pay for medications will be in advance as this is different depending on your insurance coverage. However, we may be able to find a substitute medication at lower cost or fill out paperwork to get insurance to cover a needed medication.   If a prior authorization is required to get your medication covered by your insurance company, please allow us 1-2 business days to complete this process.    Drug prices often vary depending on where the prescription is filled and some pharmacies may offer cheaper prices.  The website www.goodrx.com contains coupons for medications through different pharmacies. The prices here do not account for what the cost may be with help from insurance (it may be cheaper with your insurance), but the website can give you the price if you did not use any insurance.  - You can print the associated coupon and take it with your prescription to the pharmacy.   - You may also stop by our office during regular business hours and pick up a GoodRx coupon card.  - If you need your prescription sent electronically to a different pharmacy, notify our office through [facility] MyChart or by phone at [phone] option 4.     Si Usted Necesita Algo Despus de Su Visita  Tambin puede enviarnos un mensaje a travs de MyChart. Por lo general respondemos a los mensajes de MyChart en el transcurso de 1 a 2 das hbiles.  Para renovar recetas, por favor pida a su farmacia que se ponga en contacto con nuestra oficina. Nuestro nmero de fax es el [phone].  Si tiene un asunto urgente cuando la clnica est cerrada y que no puede esperar hasta el siguiente da hbil, puede llamar/localizar a su doctor(a) al nmero que aparece a continuacin.   Por favor, tenga en cuenta que aunque hacemos todo lo posible para estar disponibles para asuntos urgentes fuera del horario de oficina, no estamos disponibles las 24 horas del da, los 7 das de la semana.   Si tiene un problema urgente y no puede comunicarse con nosotros, puede optar por buscar atencin mdica  en el consultorio de su doctor(a), en una clnica privada, en un centro de atencin urgente o en una sala de emergencias.  Si tiene una emergencia mdica, por favor llame inmediatamente al 911 o vaya a la sala de emergencias.  Nmeros de bper  - Dr. Kowalski: [phone]  - Dra. Moye: [phone]  - Dra. Stewart: [phone]  En caso de inclemencias del tiempo, por favor llame a nuestra lnea principal al [phone] para una actualizacin sobre el estado de cualquier retraso o cierre.  Consejos para la medicacin en dermatologa: Por favor, guarde las cajas en las que vienen los medicamentos de uso tpico para ayudarle a seguir las instrucciones sobre dnde y cmo usarlos. Las farmacias generalmente imprimen las instrucciones del medicamento slo en las cajas y no directamente en los tubos del  medicamento.   Si su medicamento es muy caro, por favor, pngase en contacto con nuestra oficina llamando al [phone] y presione la opcin 4 o envenos un mensaje a travs de MyChart.   No podemos decirle cul ser su copago por los medicamentos por adelantado ya que esto es diferente dependiendo de la cobertura de su seguro. Sin embargo, es posible que podamos encontrar un medicamento sustituto a menor costo o llenar un formulario para que el seguro cubra el medicamento que se considera necesario.   Si se requiere una autorizacin previa para que su compaa de seguros cubra su medicamento, por favor permtanos de 1 a 2 das hbiles para completar este proceso.  Los precios de los medicamentos varan con frecuencia dependiendo del lugar de dnde se surte la receta y alguna farmacias pueden ofrecer precios ms baratos.  El sitio web www.goodrx.com tiene cupones para medicamentos de diferentes farmacias. Los precios aqu no tienen en cuenta lo que podra costar con la ayuda del seguro (puede ser ms   barato con su seguro), pero el sitio web puede darle el precio si no utiliz ningn seguro.  - Puede imprimir el cupn correspondiente y llevarlo con su receta a la farmacia.  - Tambin puede pasar por nuestra oficina durante el horario de atencin regular y recoger una tarjeta de cupones de GoodRx.  - Si necesita que su receta se enve electrnicamente a una farmacia diferente, informe a nuestra oficina a travs de MyChart de [place] o por telfono llamando al [phone] y presione la opcin 4.  

## 2023-02-20 ENCOUNTER — Encounter: Payer: Self-pay | Admitting: Dermatology

## 2023-02-22 ENCOUNTER — Encounter: Payer: Self-pay | Admitting: Internal Medicine

## 2023-02-22 DIAGNOSIS — Z91018 Allergy to other foods: Secondary | ICD-10-CM

## 2023-02-22 HISTORY — DX: Allergy to other foods: Z91.018

## 2023-02-22 LAB — INTERPRETATION:

## 2023-02-22 LAB — ALPHA-GAL PANEL
Allergen, Pork, f26: 0.39 kU/L — ABNORMAL HIGH
Beef: 0.81 kU/L — ABNORMAL HIGH
CLASS: 2
Class: 1
GALACTOSE-ALPHA-1,3-GALACTOSE IGE*: 6.29 kU/L — ABNORMAL HIGH (ref <0.10)

## 2023-02-24 ENCOUNTER — Encounter: Payer: Self-pay | Admitting: Internal Medicine

## 2023-03-01 ENCOUNTER — Ambulatory Visit: Payer: 59 | Admitting: Internal Medicine

## 2023-03-01 ENCOUNTER — Telehealth: Payer: Self-pay | Admitting: Internal Medicine

## 2023-03-01 NOTE — Telephone Encounter (Signed)
Inbound call from patient regarding today's appointment 7/2. Stated he spoke with Dr. Rhea Belton last week regarding test results. Stated he no longer saw appointment on mychart after phone call so her assumed that it was cancelled. Please advise, thank you.

## 2023-03-15 ENCOUNTER — Other Ambulatory Visit: Payer: Self-pay

## 2023-05-09 ENCOUNTER — Other Ambulatory Visit: Payer: Self-pay | Admitting: Internal Medicine

## 2023-05-17 ENCOUNTER — Encounter: Payer: Self-pay | Admitting: Internal Medicine

## 2023-05-24 ENCOUNTER — Other Ambulatory Visit: Payer: Self-pay

## 2023-06-08 ENCOUNTER — Other Ambulatory Visit: Payer: Self-pay

## 2023-06-08 ENCOUNTER — Emergency Department: Payer: 59

## 2023-06-08 ENCOUNTER — Emergency Department
Admission: EM | Admit: 2023-06-08 | Discharge: 2023-06-08 | Disposition: A | Discharge status: Home / Self Care | Payer: 59 | Attending: Emergency Medicine | Admitting: Emergency Medicine

## 2023-06-08 ENCOUNTER — Other Ambulatory Visit: Payer: Self-pay | Admitting: Thoracic Surgery (Cardiothoracic Vascular Surgery)

## 2023-06-08 DIAGNOSIS — R0789 Other chest pain: Secondary | ICD-10-CM | POA: Insufficient documentation

## 2023-06-08 DIAGNOSIS — R600 Localized edema: Secondary | ICD-10-CM | POA: Diagnosis not present

## 2023-06-08 DIAGNOSIS — R001 Bradycardia, unspecified: Secondary | ICD-10-CM | POA: Insufficient documentation

## 2023-06-08 DIAGNOSIS — I7121 Aneurysm of the ascending aorta, without rupture: Secondary | ICD-10-CM

## 2023-06-08 DIAGNOSIS — R079 Chest pain, unspecified: Secondary | ICD-10-CM

## 2023-06-08 LAB — CBC
HCT: 42.1 % (ref 39.0–52.0)
Hemoglobin: 14.2 g/dL (ref 13.0–17.0)
MCH: 27.8 pg (ref 26.0–34.0)
MCHC: 33.7 g/dL (ref 30.0–36.0)
MCV: 82.5 fL (ref 80.0–100.0)
Platelets: 196 10*3/uL (ref 150–400)
RBC: 5.1 MIL/uL (ref 4.22–5.81)
RDW: 12 % (ref 11.5–15.5)
WBC: 6.9 10*3/uL (ref 4.0–10.5)
nRBC: 0 % (ref 0.0–0.2)

## 2023-06-08 LAB — BASIC METABOLIC PANEL
Anion gap: 9 (ref 5–15)
BUN: 13 mg/dL (ref 8–23)
CO2: 26 mmol/L (ref 22–32)
Calcium: 9.1 mg/dL (ref 8.9–10.3)
Chloride: 101 mmol/L (ref 98–111)
Creatinine, Ser: 1.01 mg/dL (ref 0.61–1.24)
GFR, Estimated: >60 mL/min (ref >60)
Glucose, Bld: 121 mg/dL — ABNORMAL HIGH (ref 70–99)
Potassium: 3.5 mmol/L (ref 3.5–5.1)
Sodium: 136 mmol/L (ref 135–145)

## 2023-06-08 LAB — TROPONIN I (HIGH SENSITIVITY)
Troponin I (High Sensitivity): 9 ng/L (ref <18)
Troponin I (High Sensitivity): 9 ng/L (ref <18)

## 2023-06-08 MED ORDER — IOHEXOL 350 MG/ML SOLN
75.0000 mL | Freq: Once | INTRAVENOUS | Status: AC | PRN
  Administered 2023-06-08: 75 mL via INTRAVENOUS

## 2023-06-08 NOTE — ED Provider Notes (Signed)
[facility] Provider Note    Event Date/Time   First MD Initiated Contact with Patient 06/08/23 1228     (approximate)   History   Chest Pain   HPI  Jorge Peterson is a 62 y.o. male  PMH per cardiology: He has had 2 A-fib ablations by Dr. Johney Frame 1 in 2012, and a second 2020. A CT was performed that did not show PE or other obvious cause of his shortness of breath. TTE showed dilation of his aortic root to 4 cm. Coronary Ct showed mild obstructive disease. Heart monitor showed nocturnal bradycardia but no AF.      Patient started having chest discomfort a light feeling underneath his breastbone at about 930 this morning.  No fevers chills nausea or vomiting.  No recent illness.  He does have a recently diagnosed being monitored dilated aortic root    Physical Exam   Triage Vital Signs: ED Triage Vitals [06/08/23 1107]  Encounter Vitals Group     BP 123/74     Systolic BP Percentile      Diastolic BP Percentile      Pulse Rate 65     Resp 17     Temp 98.2 F (36.8 C)     Temp Source Oral     SpO2 96 %     Weight 190 lb (86.2 kg)     Height 5\' 6"  (1.676 m)     Head Circumference      Peak Flow      Pain Score 5     Pain Loc      Pain Education      Exclude from Growth Chart     Most recent vital signs: Vitals:   06/08/23 1107 06/08/23 1330  BP: 123/74 121/71  Pulse: 65 (!) 59  Resp: 17 (!) 23  Temp: 98.2 F (36.8 C)   SpO2: 96% 100%     General: Awake, no distress.  Very pleasant.  Wife also at the bedside CV:  Good peripheral perfusion.  Normal tones and rate Resp:  Normal effort.  Clear bilateral Abd:  No distention.  Soft nontender nondistended Other:  Lower extremity edema venous cords or congestion   ED Results / Procedures / Treatments   Labs (all labs ordered are listed, but only abnormal results are displayed) Labs Reviewed  BASIC METABOLIC PANEL - Abnormal; Notable for the following components:      Result Value    Glucose, Bld 121 (*)    All other components within normal limits  CBC  TROPONIN I (HIGH SENSITIVITY)  TROPONIN I (HIGH SENSITIVITY)    CT angiography chest pending at time of signout   PROCEDURES:  Critical Care performed: No  Procedures   MEDICATIONS ORDERED IN ED: Medications - No data to display   IMPRESSION / MDM / ASSESSMENT AND PLAN / ED COURSE  I reviewed the triage vital signs and the nursing notes.                              Differential diagnosis includes, but is not limited to, ACS, aortic dissection, pulmonary embolism, cardiac tamponade, pneumothorax, pneumonia, pericarditis, myocarditis, GI-related causes including esophagitis/gastritis, and musculoskeletal chest wall pain.    Reassuring cardiac workup and that initial ECG and troponin are normal without evidence of acute ischemia.  Repeat troponin is also normal making ACS highly unlikely.    Patient's presentation is most  consistent with acute complicated illness / injury requiring diagnostic workup.   The patient is on the cardiac monitor to evaluate for evidence of arrhythmia and/or significant heart rate changes.  No hypoxia no tachycardia no signs or symptoms that would suggest acute pulmonary cause.  No associated abdominal pain on examination.  No GI symptoms.  No evidence of pneumothorax.  Clinical Course as of 06/08/23 1502  Wed Jun 08, 2023  1228 Chest x-ray interpreted by me as clear lungs [MQ]  1229 EKG interpreted by me as normal sinus rhythm with mild first-degree AV block.  No evidence of ischemia.  QRS QT and morphology otherwise normal [MQ]    Clinical Course User Index [MQ] Sharyn Creamer, MD   ----------------------------------------- 2:58 PM on 06/08/2023 ----------------------------------------- Thus far reassuring evaluation.  Pending CT angiography of the chest.  If CT angiography without acute finding or concern, would anticipate likely discharge to home with close follow-up  with his cardiology team.  Ongoing care with follow-up on CT angiography assigned to oncoming partner Dr. Marisa Severin  FINAL CLINICAL IMPRESSION(S) / ED DIAGNOSES   Final diagnoses:  Chest pain with low risk for cardiac etiology     Rx / DC Orders   ED Discharge Orders          Ordered    Ambulatory referral to Cardiology       Comments: ER, chest pain follow-up   06/08/23 1502             Note:  This document was prepared using Dragon voice recognition software and may include unintentional dictation errors.   Sharyn Creamer, MD 06/08/23 [phone]

## 2023-06-08 NOTE — ED Provider Notes (Signed)
-----------------------------------------   5:16 PM on 06/08/2023 -----------------------------------------  I took over care of this patient from Dr. Fanny Bien.  CT shows a stable 4.4 cm aneurysm with no other acute findings.  IMPRESSION:  No evidence of pulmonary embolus or aortic dissection.    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 Recommend  annual imaging followup by CTA or MRA. This recommendation follows  2010 ACCF/AHA/AATS/ACR/ASA/SCA/SCAI/SIR/STS/SVM Guidelines for the  Diagnosis and Management of Patients with Thoracic Aortic Disease.  Circulation. 2010; 121: A630-Z601. Aortic aneurysm NOS (ICD10-I71.9)    No acute cardiopulmonary disease.    On reassessment, the patient is asymptomatic.  His vital signs are stable.  He is stable for discharge home at this time.  I counseled him on the results of the workup and on follow-up including a repeat CT or MRI in 1 year.  I gave strict return precautions and he expressed understanding.   Dionne Bucy, MD 06/08/23 [phone]

## 2023-06-08 NOTE — ED Triage Notes (Addendum)
Pt presents to ED with CP and SOB that started 1 hour ago. Pt states HX of aortic aneurysm that is being watched. NAD noted.

## 2023-06-08 NOTE — Discharge Instructions (Addendum)
Your CT scan shows a stable aneurysm at 4.4 cm.  Follow-up with your primary care provider.  You should have a repeat CT or MRI and 1 year.  Return to the ER for new, worsening, or persistent severe chest pain, shortness of breath, or any other new or worsening symptoms that concern you.

## 2023-06-13 ENCOUNTER — Encounter: Payer: Self-pay | Admitting: Thoracic Surgery (Cardiothoracic Vascular Surgery)

## 2023-06-16 ENCOUNTER — Telehealth: Payer: Self-pay | Admitting: Cardiovascular Disease

## 2023-06-16 NOTE — Telephone Encounter (Signed)
Pt is calling to let Dr. Nelly Laurence know that he went to [facility] on 10/9 , for complaints of chest discomfort.  He stated while at [facility], they did an EKG and several other cardiac test, to work this up.   Pt states the ED Provider advised him to run the EKG tracing by Dr. Nelly Laurence, and make sure he doesn't notice any new findings.  Pt ask that I get a message to Dr. Nelly Laurence and Brayton Caves RN to have them review the ER EKG and advise if he is ok to follow-up as planned with them next April or should his follow-up appt be moved to a sooner date.  Pt states he also had a CT ANGIO AORTA done at [facility] as well, and he is placing a call to [facility] today, to check in with them to look at that result and inquire if he still needs to have the CT ANGIO AORTA done in Nov that they originally ordered for him to have done.  Advised him to call them today about this, and they will more than likely be able to use the CT ANGIO from [facility] from 10/9 vs having him repeat this on 11/11.  Pt is aware that Dr. Nelly Laurence and Brayton Caves RN are out of the office today, but I will route this message to them to further review and follow-up with him accordingly, when they return to the office.  Pt verbalized understanding and agrees with this plan.

## 2023-06-16 NOTE — Telephone Encounter (Signed)
Patient is requesting Dr. Nelly Laurence take a look at patients most recent CT and EKG that was done at  [facility].

## 2023-06-17 NOTE — Telephone Encounter (Signed)
Attempted to contact patient, per Dr Nelly Laurence EKG looks good. Follow up appointment should be around next April, instructed patient to contact our office to schedule.

## 2023-07-05 ENCOUNTER — Encounter: Payer: Self-pay | Admitting: Internal Medicine

## 2023-07-11 ENCOUNTER — Other Ambulatory Visit: Payer: 59

## 2023-07-13 NOTE — Progress Notes (Addendum)
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 He was last seen by my colleague in November 2023 and the incidentally found aneurysm was 4.4 cm at that time. He presents today for further surveillance of an aortic root aneurysm at the sinus of Valsalva. He sometimes has chest discomfort, usually with exertion, palpitations and dizziness. Of note, he was seen at [facility] ED in October for chest pain/pressure, radiating to his back and some shortness of breath. He was not found to have PE, dissection, or ACS and was discharged home. He is trying to exercise more and has intentionally lost 30 pounds. Past Medical History:  Diagnosis Date   AAA (abdominal aortic aneurysm) (HCC)    Allergy to alpha-gal 02/22/2023   Dx June 2024 -- advised to avoid all beef and pork of any kind   Atrial fibrillation [facility])    s/p afib ablation 7/12 and 11/20   Bradycardia    asymptomatic   Colonic diverticular abscess    Diabetes (HCC)    Diverticulitis    Elevated LFTs    Fall    Family history of anesthesia complication    father gets pneumonia after "   GERD (gastroesophageal reflux disease)    Hyperlipidemia    Hyperplastic colon polyp    Hypertension    Hypertensive retinopathy    OU   Internal hemorrhoids    Tinnitus    Tubular adenoma of colon    Wears glasses     Past Surgical History:  Procedure Laterality Date   ATRIAL ABLATION SURGERY  03/24/11   PVI by Fawn Kirk   ATRIAL FIBRILLATION ABLATION N/A 07/24/2019   Procedure: ATRIAL FIBRILLATION ABLATION;  Surgeon:  Hillis Range, MD;  Location: [facility] INVASIVE CV LAB;  Service: Cardiovascular;  Laterality: N/A;   BACK SURGERY     BRONCHOSCOPY  01/23/2013    biopsy   CHOLECYSTECTOMY  05/02/2014   LAP CHOLE   CHOLECYSTECTOMY N/A 05/02/2014   Procedure: LAPAROSCOPIC CHOLECYSTECTOMY WITH INTRAOPERATIVE CHOLANGIOGRAM;  Surgeon: Harriette Bouillon, MD;  Location: [facility] OR;  Service: General;  Laterality: N/A;   EP IMPLANTABLE DEVICE N/A 06/27/2015   Procedure: Loop Recorder Insertion;  Surgeon: Hillis Range, MD;  Location: [facility] INVASIVE CV LAB;  Service: Cardiovascular;  Laterality: N/A;   ERCP N/A 05/25/2014   Procedure: ENDOSCOPIC RETROGRADE CHOLANGIOPANCREATOGRAPHY (ERCP);  Surgeon: Rachael Fee, MD;  Location: [facility] ENDOSCOPY;  Service: Endoscopy;  Laterality: N/A;   Fistula surgery     LOOP RECORDER REMOVAL N/A 07/24/2019   Procedure: LOOP RECORDER REMOVAL;  Surgeon: Hillis Range, MD;  Location: [facility] INVASIVE CV LAB;  Service: Cardiovascular;  Laterality: N/A;   MEDIASTINOSCOPY N/A 01/23/2013   Procedure: MEDIASTINOSCOPY;  Surgeon: Kerin Perna, MD;  Location: [facility] OR;  Service: Thoracic;  Laterality: N/A;   QUADRICEPS TENDON REPAIR Right 01/13/2016   Procedure: REPAIR QUADRICEP TENDON;  Surgeon: Ranee Gosselin, MD;  Location: [facility] ORS;  Service: Orthopedics;  Laterality: Right;  Received femoral nerve block in  holding   SHOULDER SURGERY     x 2   TONSILLECTOMY     VIDEO BRONCHOSCOPY WITH ENDOBRONCHIAL ULTRASOUND N/A 01/23/2013   Procedure: VIDEO BRONCHOSCOPY WITH ENDOBRONCHIAL ULTRASOUND;  Surgeon: Kerin Perna, MD;  Location: [facility] OR;  Service: Thoracic;  Laterality: N/A;    Family History  Problem Relation Age of Onset   Liver disease Mother        fatty liver   Diabetes Father    Heart disease Father        CABG at 46   Colon cancer Neg Hx    Stomach cancer Neg Hx    Pancreatic cancer Neg Hx    Esophageal cancer Neg Hx   His father had CAD, bypass surgery, CHF  Social History Social History   Tobacco  Use   Smoking status: Never   Smokeless tobacco: Never  Vaping Use   Vaping status: Never Used  Substance Use Topics   Alcohol use: No   Drug use: No  He is a Optician, dispensing, Orthoptist  Current Outpatient Medications  Medication Sig Dispense Refill   acetaminophen (TYLENOL) 500 MG tablet Take 2 tablets (1,000 mg total) by mouth every 6 (six) hours as needed. 30 tablet 0   albuterol (VENTOLIN HFA) 108 (90 Base) MCG/ACT inhaler INHALE 2 PUFFS BY MOUTH EVERY 6 HOURS AS NEEDED FOR WHEEZE     apixaban (ELIQUIS) 5 MG TABS tablet TAKE 1 TABLET(5 MG) BY MOUTH TWICE DAILY 60 tablet 5   Biotin 5000 MCG TABS Take 800 mcg by mouth daily.      brimonidine-timolol (COMBIGAN) 0.2-0.5 % ophthalmic solution      budesonide-formoterol (SYMBICORT) 160-4.5 MCG/ACT inhaler Inhale 2 puffs into the lungs as needed.     Dulaglutide (TRULICITY) 3 MG/0.5ML SOPN Inject 3 mg into the skin every 7 (seven) days. 2 mL 5   fluticasone (FLONASE) 50 MCG/ACT nasal spray Place 1 spray into the nose daily as needed for allergies.      folic acid (FOLVITE) 400 MCG tablet Take 400 mcg by mouth daily.     glipiZIDE (GLUCOTROL XL) 5 MG 24 hr tablet Take 5 mg by mouth daily. (Patient not taking: Reported on 02/08/2023)     hydrochlorothiazide (HYDRODIURIL) 25 MG tablet TAKE 1 TABLET(25 MG) BY MOUTH EVERY DAY     losartan (COZAAR) 100 MG tablet Take 100 mg by mouth every evening.   3   Multiple Vitamin (MULTIVITAMIN WITH MINERALS) TABS tablet Take 1 tablet by mouth daily. Centrum Silver     Omega-3 Fatty Acids (FISH OIL PO) Take 1 capsule by mouth 2 (two) times daily.     omeprazole (PRILOSEC) 40 MG capsule TAKE 1 CAPSULE BY MOUTH EVERY DAY 90 capsule 0   rosuvastatin (CRESTOR) 10 MG tablet TAKE 1 TABLET(10 MG) BY MOUTH DAILY 90 tablet 3   traZODone (DESYREL) 50 MG tablet Take 50 mg by mouth every other day.     TRULICITY 3 MG/0.5ML SOPN Inject 3 mg into the skin once a week.     Allergies Allergies  Allergen Reactions    Codeine Itching and Rash   Latex Itching and Rash   Oxycodone-Acetaminophen Itching and Rash    Review of Systems:  General Review of Systems: [Y] = yes [ N]=no  Consitutional:   nausea [ ] ;  fever [ ] ;  Resp: cough Klaus.Mock ];  hemoptysis[N ];  GI: vomiting[N ]; melena[N ]; hematochezia [N];  WC:BJSEGBTDV[V ]; Heme/Lymph: anemia[ N];  Neuro: TIA[N ];stroke[  N];  seizures[N ];  Endocrine: diabetes[ Y];   Vital Signs: Vitals:   07/18/23 1352  BP: 121/80  Pulse: 62  Resp: 18  SpO2: 96%     Physical Exam: CV-RRR, no murmur Neck-No carotid bruit Pulmonary-Clear to auscultation bilaterally Abdomen-Soft, non tender, bowel sounds present Extremities-No LE edema. Knee brace on left knee (had injection) Neurologic-Grossly intact without focal deficit  Diagnostic Tests: Study Result  Narrative & Impression  CLINICAL DATA:  Chest pain, shortness of breath. History of aortic aneurysm.   EXAM: CT ANGIOGRAPHY CHEST WITH CONTRAST   TECHNIQUE: Multidetector CT imaging of the chest was performed using the standard protocol during bolus administration of intravenous contrast. Multiplanar CT image reconstructions and MIPs were obtained to evaluate the vascular anatomy.   RADIATION DOSE REDUCTION: This exam was performed according to the departmental dose-optimization program which includes automated exposure control, adjustment of the mA and/or kV according to patient size and/or use of iterative reconstruction technique.   CONTRAST:  75mL OMNIPAQUE IOHEXOL 350 MG/ML SOLN   COMPARISON:  05/25/2022   FINDINGS: Cardiovascular: No filling defects in the pulmonary arteries to suggest pulmonary emboli. Heart is normal size. Mild aneurysmal dilatation of the aortic root at the sinuses of Valsalva, 4.4 cm compared to 4.7 cm maximally.   Mediastinum/Nodes: No mediastinal, hilar, or axillary adenopathy. Trachea and esophagus are unremarkable. Thyroid unremarkable.   Lungs/Pleura: 9  mm left upper lobe pulmonary nodule. This has been present dating back to studies from 2014 and is considered benign. No confluent airspace opacities or effusions.   Upper Abdomen: No acute findings in the upper abdomen.   Musculoskeletal: Chest wall soft tissues are unremarkable. No acute bony abnormality.   Review of the MIP images confirms the above findings.   IMPRESSION: No evidence of pulmonary embolus or aortic dissection.   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 Recommend annual imaging followup by CTA or MRA. This recommendation follows 2010 ACCF/AHA/AATS/ACR/ASA/SCA/SCAI/SIR/STS/SVM Guidelines for the Diagnosis and Management of Patients with Thoracic Aortic Disease. Circulation. 2010; 121: Z610-R604. Aortic aneurysm NOS (ICD10-I71.9)   No acute cardiopulmonary disease.     Electronically Signed   By: Charlett Nose M.D.   On: 06/08/2023 16:49       Impression and Plan: Regarding chest discomfort, palpitations, dizziness, he will contact Dr. Morrie Sheldon office for an appointment (has history of a fib, previously ablated x 2).  CTA with a 4.4 cm ascending aortic aneurysm.  Also, a 9 mm left upper lobe pulmonary nodule. This has been present dating back to studies from 2014 and is considered benign. Echocardiogram done September 2023 showed no evidence of aortic regurgitation.  We discussed the natural history and and risk factors for growth of ascending aortic aneurysms.  We covered the importance of smoking cessation, tight blood pressure control, refraining from lifting heavy objects, and avoiding fluoroquinolones.  The patient is aware of signs and symptoms of aortic dissection and when to present to the emergency department.  We will continue surveillance and a repeat CTA was ordered for 1 year.   Ardelle Balls, PA-C [facility] [phone]

## 2023-07-18 ENCOUNTER — Ambulatory Visit: Payer: 59 | Admitting: Physician Assistant

## 2023-07-18 ENCOUNTER — Encounter: Payer: Self-pay | Admitting: Cardiovascular Disease

## 2023-07-18 ENCOUNTER — Encounter: Payer: Self-pay | Admitting: Physician Assistant

## 2023-07-18 ENCOUNTER — Other Ambulatory Visit: Payer: Self-pay

## 2023-07-18 VITALS — BP 121/80 | HR 62 | Resp 18 | Ht 66.0 in | Wt 200.0 lb

## 2023-07-18 DIAGNOSIS — I7121 Aneurysm of the ascending aorta, without rupture: Secondary | ICD-10-CM | POA: Diagnosis not present

## 2023-07-18 MED ORDER — TRULICITY 3 MG/0.5ML ~~LOC~~ SOAJ
3.0000 mg | SUBCUTANEOUS | 5 refills | Status: DC
  Filled 2023-07-18: qty 2, 28d supply, fill #0
  Filled 2023-08-24: qty 2, 28d supply, fill #1
  Filled 2023-10-14 (×2): qty 2, 28d supply, fill #2
  Filled 2023-11-06: qty 2, 28d supply, fill #3
  Filled 2023-12-16: qty 2, 28d supply, fill #4
  Filled 2024-01-17 (×2): qty 2, 28d supply, fill #5

## 2023-07-18 NOTE — Patient Instructions (Signed)
Risk Modification in those with ascending thoracic aortic aneurysm:  Continue good control of blood pressure (prefer SBP 130/80 or less)-continue Losartan (Cozaar)  2. Avoid fluoroquinolone antibiotics (I.e Ciprofloxacin, Avelox, Levofloxacin, Ofloxacin)  3.  Use of statin (to decrease cardiovascular risk)-per lipid profile May 2024: Total cholesterol 140, Triglycerides 110, HDL 41.7, and LDL 76. Continue Crestor  4.  Exercise and activity limitations is individualized, but in general, contact sports are to be avoided and one should avoid heavy lifting (defined as half of ideal body weight) and exercises involving sustained Valsalva maneuver.  5. Counseling for those suspected of having genetically mediated disease. First-degree relatives of those with TAA disease should be screened as well as those who have a connective tissue disease (I.e with Marfan syndrome, Ehlers-Danlos syndrome,  and Loeys-Dietz syndrome) or a  bicuspid aortic valve,have an increased risk for complications related to TAA. Echo done in September 2023 showed no aortic insufficiency Patient does not have a history of connective tissue disease in his family.  6. He does not have a history of tobacco abuse

## 2023-07-19 NOTE — Telephone Encounter (Signed)
Attempted to contact patient to discuss upcoming appointment, no answer, left message.

## 2023-07-21 ENCOUNTER — Encounter: Payer: Self-pay | Admitting: Cardiovascular Disease

## 2023-07-21 ENCOUNTER — Encounter: Payer: Self-pay | Admitting: *Deleted

## 2023-07-21 ENCOUNTER — Other Ambulatory Visit: Payer: Self-pay | Admitting: Cardiovascular Disease

## 2023-07-21 ENCOUNTER — Ambulatory Visit: Payer: 59 | Attending: Cardiovascular Disease | Admitting: Cardiovascular Disease

## 2023-07-21 VITALS — BP 114/70 | HR 67 | Ht 66.0 in | Wt 195.6 lb

## 2023-07-21 DIAGNOSIS — I480 Paroxysmal atrial fibrillation: Secondary | ICD-10-CM | POA: Diagnosis not present

## 2023-07-21 DIAGNOSIS — R002 Palpitations: Secondary | ICD-10-CM

## 2023-07-21 DIAGNOSIS — R001 Bradycardia, unspecified: Secondary | ICD-10-CM

## 2023-07-21 DIAGNOSIS — I493 Ventricular premature depolarization: Secondary | ICD-10-CM

## 2023-07-21 NOTE — Progress Notes (Unsigned)
Patient enrolled for [employer]/ [employer] to ship a 30 day cardiac event monitor to his address on file.

## 2023-07-21 NOTE — Patient Instructions (Addendum)
Medication Instructions:  Your physician recommends that you continue on your current medications as directed. Please refer to the Current Medication list given to you today. *If you need a refill on your cardiac medications before your next appointment, please call your pharmacy*   Testing/Procedures: Cardiac Monitor for 30 days - this will come to you in the mail Your physician has recommended that you wear an event monitor. Event monitors are medical devices that record the heart's electrical activity. Doctors most often [place] these monitors to diagnose arrhythmias. Arrhythmias are problems with the speed or rhythm of the heartbeat. The monitor is a small, portable device. You can wear one while you do your normal daily activities. This is usually used to diagnose what is causing palpitations/syncope (passing out).   Follow-Up: At [facility], you and your health needs are our priority.  As part of our continuing mission to provide you with exceptional heart care, we have created designated Provider Care Teams.  These Care Teams include your primary Cardiologist (physician) and Advanced Practice Providers (APPs -  Physician Assistants and Nurse Practitioners) who all work together to provide you with the care you need, when you need it.  We recommend signing up for the patient portal called "MyChart".  Sign up information is provided on this After Visit Summary.  MyChart is used to connect with patients for Virtual Visits (Telemedicine).  Patients are able to view lab/test results, encounter notes, upcoming appointments, etc.  Non-urgent messages can be sent to your provider as well.   To learn more about what you can do with MyChart, go to [email or web address].    Your next appointment:   2 month(s)  Provider:   York Pellant, MD   [employer] Cardiac Event Monitor Instructions  Your physician has requested you wear your cardiac event monitor for 30 days [employer] may call or  text to confirm a shipping address. The monitor will be sent to a land address via UPS. [employer] will not ship a monitor to a PO BOX. It typically takes 3-5 days to receive your monitor after it has been enrolled. [employer] will assist with USPS tracking if your package is delayed. The telephone number for [employer] is [phone]. Once you have received your monitor, please review the enclosed instructions. Instruction tutorials can also be viewed under help and settings on the enclosed cell phone. Your monitor has already been registered assigning a specific monitor serial # to you.  Billing and Self Pay Discount Information  [employer] has been provided the insurance information we had on file for you.  If your insurance has been updated, please call [employer] at [phone] to provide them with your updated insurance information.   [employer] offers a discounted Self Pay option for patients who have insurance that does not cover their cardiac event monitor or patients without insurance.  The discounted cost of a Self Pay Cardiac Event Monitor would be $225.00 , if the patient contacts [employer] at [phone] within 7 days of applying the monitor to make payment arrangements.  If the patient does not contact [employer] within 7 days of applying the monitor, the cost of the cardiac event monitor will be $350.00.  Applying the monitor  Remove cell phone from case and turn it on. The cell phone works as IT consultant and needs to be within [employer] of you at all times. The cell phone will need to be charged on a daily basis. We recommend you plug the cell phone into the enclosed charger  at your bedside table every night.  Monitor batteries: You will receive two monitor batteries labelled #1 and #2. These are your recorders. Plug battery #2 onto the second connection on the enclosed charger. Keep one battery on the charger at all times. This will keep the monitor battery  deactivated. It will also keep it fully charged for when you need to switch your monitor batteries. A small light will be blinking on the battery emblem when it is charging. The light on the battery emblem will remain on when the battery is fully charged.  Open package of a Monitor strip. Insert battery #1 into black hood on strip and gently squeeze monitor battery onto connection as indicated in instruction booklet. Set aside while preparing skin.  Choose location for your strip, vertical or horizontal, as indicated in the instruction booklet. Shave to remove all hair from location. There cannot be any lotions, oils, powders, or colognes on skin where monitor is to be applied. Wipe skin clean with enclosed Saline wipe. Dry skin completely.  Peel paper labeled #1 off the back of the Monitor strip exposing the adhesive. Place the monitor on the chest in the vertical or horizontal position shown in the instruction booklet. One arrow on the monitor strip must be pointing upward. Carefully remove paper labeled #2, attaching remainder of strip to your skin. Try not to create any folds or wrinkles in the strip as you apply it.  Firmly press and release the circle in the center of the monitor battery. You will hear a small beep. This is turning the monitor battery on. The heart emblem on the monitor battery will light up every 5 seconds if the monitor battery in turned on and connected to the patient securely. Do not push and hold the circle down as this turns the monitor battery off. The cell phone will locate the monitor battery. A screen will appear on the cell phone checking the connection of your monitor strip. This may read poor connection initially but change to good connection within the next minute. Once your monitor accepts the connection you will hear a series of 3 beeps followed by a climbing crescendo of beeps. A screen will appear on the cell phone showing the two monitor strip  placement options. Touch the picture that demonstrates where you applied the monitor strip.  Your monitor strip and battery are waterproof. You are able to shower, bathe, or swim with the monitor on. They just ask you do not submerge deeper than 3 feet underwater. We recommend removing the monitor if you are swimming in a lake, river, or ocean.  Your monitor battery will need to be switched to a fully charged monitor battery approximately once a week. The cell phone will alert you of an action which needs to be made.  On the cell phone, tap for details to reveal connection status, monitor battery status, and cell phone battery status. The green dots indicates your monitor is in good status. A red dot indicates there is something that needs your attention.  To record a symptom, click the circle on the monitor battery. In 30-60 seconds a list of symptoms will appear on the cell phone. Select your symptom and tap save. Your monitor will record a sustained or significant arrhythmia regardless of you clicking the button. Some patients do not feel the heart rhythm irregularities. [employer] will notify us of any serious or critical events.  Refer to instruction booklet for instructions on switching batteries, changing strips, the Do  not disturb or Pause features, or any additional questions.  Call [employer] at [phone], to confirm your monitor is transmitting and record your baseline. They will answer any questions you may have regarding the monitor instructions at that time.  Returning the monitor to [employer]  Place all equipment back into blue box. Peel off strip of paper to expose adhesive and close box securely. There is a prepaid UPS shipping label on this box. Drop in a UPS drop box, or at a UPS facility like [employer]. You may also contact [employer] to arrange UPS to pick up monitor package at your home.

## 2023-07-21 NOTE — Progress Notes (Signed)
Cardiology Office Note:    Date:  07/21/2023   ID:  Jorge Peterson, DOB 01/22/1961, MRN [id number]  PCP:  Jerl Mina, MD   [facility] Providers Cardiologist:  None Electrophysiologist:  Maurice Small, MD     Referring MD: Jerl Mina, MD   Chief complaint: shortness of breath   History of Present Illness:    Jorge Peterson is a 62 y.o. male with a hx of HTN, HLD, DM, GERD, atrial fibrillation, and asymptomatic bradycardia. He is a former patient of Dr. Johney Frame.  He has had 2 A-fib ablations by Dr. Johney Frame 1 in 2012, and a second 2020.  His symptoms were fairly insidious -- shortness of breath and cough. It seems that he wasn't very aware of his AF until he had become more persistent, but at that time, he had developed mild CHF symptoms.  He was seen in EP clinic last month and noticed new onset fatigue and low O2 sats at home. A CT was performed that did not show PE or other obvious cause of his shortness of breath. TTE showed dilation of his aortic root to 4 cm. Coronary Ct showed mild obstructive disease. Heart monitor showed nocturnal bradycardia but no AF.    He returns today to review results from his 14 day monitor.  At the previous visit, I recommended a loop recorder to monitor and manage atrial fibrillation.  This was denied by his insurance company.  He began to have increased palpitations, so a 14-day patch monitor was placed.  There were multiple patient triggered events associated with PVCs.  The overall PVC burden was less than 1%  Over the past few months, he has begun to have episodes of rapid palpitations.  He has an Scientist, physiological, but it is not able to register his heart rate during these episodes.       Current Medications:    Allergies:   Codeine, Latex, and Oxycodone-acetaminophen       Family History: The patient's family history includes Diabetes in his father; Heart disease in his father; Liver disease in his mother. There is no  history of Colon cancer, Stomach cancer, Pancreatic cancer, or Esophageal cancer.  ROS:   Please see the history of present illness.    All other systems reviewed and are negative.  EKGs/Labs/Other Studies Reviewed:     TTE 05/13/2022 Normal LV systolic function. Grade I diastolic dysfunction.  Coronary CT: 05/26/2022 Mild obstructive CAD 25-49%  Cardiac monitor 7 days: 05/2022 HR 32-132, avg 74. Slowest rate occurred with Mobitz II block, at 10:35 PM.  Cardiac monitor: ZioPatch 14 days 3//2024 Sinus rhythm rate 30-140, avg 72  Symptoms of skipped beats and fluttering were associated with an isolated PVCs.  PVC burden was < 1%    EKG:  Last EKG results: Sinus rhythm, 1st degree AV block; voltage criteria for LVH   Recent Labs: 06/08/2023: BUN 13; Creatinine, Ser 1.01; Hemoglobin 14.2; Platelets 196; Potassium 3.5; Sodium 136          Physical Exam:    VS:  BP 114/70 (BP Location: Left Arm, Patient Position: Sitting, Cuff Size: Large)   Pulse 67   Ht 5\' 6"  (1.676 m)   Wt 195 lb 9.6 oz (88.7 kg)   SpO2 96%   BMI 31.57 kg/m     Wt Readings from Last 3 Encounters:  07/21/23 195 lb 9.6 oz (88.7 kg)  07/18/23 200 lb (90.7 kg)  06/08/23 190 lb (86.2 kg)  GEN:  Well nourished, well developed in no acute distress CARDIAC: RRR, no murmurs, rubs, gallops RESPIRATORY:  Normal work of breathing MUSCULOSKELETAL: no edema    ASSESSMENT & PLAN:    Atrial fibrillation: s/p PVI 2012, 2020. Has been on flecainide in the past. Now occasionally having palpitations. ILR was declined by insurance in the past, so we have no ECG data of episodes. Will place 30 day monitor.  Palpitations: associated with rare PVCs on monitor. Reassured  Bradycardia: occurs nocturnally on recent monitor. He had a sleep evaluation recently that did not show significant OSA.  Dyspnea, mild hypoxia: appears to be resolving. I cannot identify a cardiac -- or particularly an arrhythmic  cause  Alpha-gal allergy        Medication Adjustments/Labs and Tests Ordered: Current medicines are reviewed at length with the patient today.  Concerns regarding medicines are outlined above.  Orders Placed This Encounter  Procedures   EKG 12-Lead   No orders of the defined types were placed in this encounter.    Signed, Maurice Small, MD  07/21/2023 12:41 PM    [facility]

## 2023-08-02 DIAGNOSIS — I493 Ventricular premature depolarization: Secondary | ICD-10-CM

## 2023-08-02 DIAGNOSIS — R001 Bradycardia, unspecified: Secondary | ICD-10-CM

## 2023-08-02 DIAGNOSIS — R002 Palpitations: Secondary | ICD-10-CM

## 2023-08-02 DIAGNOSIS — I480 Paroxysmal atrial fibrillation: Secondary | ICD-10-CM | POA: Diagnosis not present

## 2023-08-11 ENCOUNTER — Other Ambulatory Visit: Payer: Self-pay | Admitting: Internal Medicine

## 2023-09-05 ENCOUNTER — Other Ambulatory Visit: Payer: Self-pay

## 2023-09-05 MED ORDER — ROSUVASTATIN CALCIUM 10 MG PO TABS: 10.0000 mg | ORAL_TABLET | Freq: Every day | ORAL | 3 refills | Status: DC

## 2023-09-05 NOTE — Progress Notes (Signed)
 [facility] - Clinic Note  09/07/2023     CHIEF COMPLAINT Patient presents for Retina Follow Up  HISTORY OF PRESENT ILLNESS: Jorge Peterson is a 63 y.o. male who presents to the clinic today for:  HPI     Retina Follow Up   In right eye.  This started 1 year ago.  Duration of 1 year.  Since onset it is stable.  I, the attending physician,  performed the HPI with the patient and updated documentation appropriately.        Comments   1 yr retina follow up COATS disease OD pt is reporting no vision changes noticed he denies any flashes or floaters last reading 118 he is using combigan  at bedtime about 3 times per week       Last edited by [name], MD on 09/07/2023  5:38 PM.     Patient states the vision the same.  Referring physician: Valora Agent, MD [address] [place],  [place] [address]  HISTORICAL INFORMATION:   Selected notes from the MEDICAL RECORD NUMBER Referred by Dr. Mevelyn for retinal vasculitis OD   CURRENT MEDICATIONS: Current Outpatient Medications (Ophthalmic Drugs)  Medication Sig   brimonidine -timolol (COMBIGAN ) 0.2-0.5 % ophthalmic solution    No current facility-administered medications for this visit. (Ophthalmic Drugs)   Current Outpatient Medications (Other)  Medication Sig   acetaminophen  (TYLENOL ) 500 MG tablet Take 2 tablets (1,000 mg total) by mouth every 6 (six) hours as needed.   albuterol  (VENTOLIN  HFA) 108 (90 Base) MCG/ACT inhaler INHALE 2 PUFFS BY MOUTH EVERY 6 HOURS AS NEEDED FOR WHEEZE   apixaban  (ELIQUIS ) 5 MG TABS tablet TAKE 1 TABLET(5 MG) BY MOUTH TWICE DAILY   Dulaglutide  (TRULICITY ) 3 MG/0.5ML SOAJ Inject 0.5 mLs (3 mg total) subcutaneously every 7 (seven) days   fluticasone  (FLONASE ) 50 MCG/ACT nasal spray Place 1 spray into the nose daily as needed for allergies.    folic acid  (FOLVITE ) 400 MCG tablet Take 400 mcg by mouth daily.   glipiZIDE (GLUCOTROL XL) 5 MG 24 hr tablet Take  5 mg by mouth daily.   hydrochlorothiazide  (HYDRODIURIL ) 25 MG tablet TAKE 1 TABLET(25 MG) BY MOUTH EVERY DAY   losartan  (COZAAR ) 100 MG tablet Take 100 mg by mouth every evening.    meloxicam (MOBIC) 15 MG tablet TAKE 1 TABLET BY MOUTH EVERY DAY WITH A MEAL   Multiple Vitamin (MULTIVITAMIN WITH MINERALS) TABS tablet Take 1 tablet by mouth daily. Centrum Silver   omeprazole  (PRILOSEC) 40 MG capsule TAKE 1 CAPSULE BY MOUTH EVERY DAY   rosuvastatin  (CRESTOR ) 10 MG tablet Take 1 tablet (10 mg total) by mouth daily.   traZODone  (DESYREL ) 50 MG tablet Take 50 mg by mouth every other day.   TRULICITY  3 MG/0.5ML SOPN Inject 3 mg into the skin once a week.   No current facility-administered medications for this visit. (Other)   REVIEW OF SYSTEMS: ROS   Positive for: Eyes Last edited by Resa Delon ORN, COT on 09/07/2023  8:22 AM.      ALLERGIES Allergies  Allergen Reactions   Codeine Itching and Rash   Latex Itching and Rash   Oxycodone -Acetaminophen  Itching and Rash   PAST MEDICAL HISTORY Past Medical History:  Diagnosis Date   AAA (abdominal aortic aneurysm) (HCC)    Allergy to alpha-gal 02/22/2023   Dx June 2024 -- advised to avoid all beef and pork of any kind   Atrial fibrillation (HCC)  s/p afib ablation 7/12 and 11/20   Bradycardia    asymptomatic   Colonic diverticular abscess    Diabetes (HCC)    Diverticulitis    Elevated LFTs    Fall    Family history of anesthesia complication    father gets pneumonia after    GERD (gastroesophageal reflux disease)    Hyperlipidemia    Hyperplastic colon polyp    Hypertension    Hypertensive retinopathy    OU   Internal hemorrhoids    Tinnitus    Tubular adenoma of colon    Wears glasses    Past Surgical History:  Procedure Laterality Date   ATRIAL ABLATION SURGERY  03/24/11   PVI by MILUS   ATRIAL FIBRILLATION ABLATION N/A 07/24/2019   Procedure: ATRIAL FIBRILLATION ABLATION;  Surgeon: Kelsie Agent, MD;  Location:  [facility];  Service: Cardiovascular;  Laterality: N/A;   BACK SURGERY     BRONCHOSCOPY  01/23/2013    biopsy   CHOLECYSTECTOMY  05/02/2014   LAP CHOLE   CHOLECYSTECTOMY N/A 05/02/2014   Procedure: LAPAROSCOPIC CHOLECYSTECTOMY WITH INTRAOPERATIVE CHOLANGIOGRAM;  Surgeon: Debby Shipper, MD;  Location: [facility] OR;  Service: General;  Laterality: N/A;   EP IMPLANTABLE DEVICE N/A 06/27/2015   Procedure: Loop Recorder Insertion;  Surgeon: Agent Kelsie, MD;  Location: [facility];  Service: Cardiovascular;  Laterality: N/A;   ERCP N/A 05/25/2014   Procedure: ENDOSCOPIC RETROGRADE CHOLANGIOPANCREATOGRAPHY (ERCP);  Surgeon: Toribio SHAUNNA Cedar, MD;  Location: [facility]COPY;  Service: Endoscopy;  Laterality: N/A;   Fistula surgery     LOOP RECORDER REMOVAL N/A 07/24/2019   Procedure: LOOP RECORDER REMOVAL;  Surgeon: Kelsie Agent, MD;  Location: [facility];  Service: Cardiovascular;  Laterality: N/A;   MEDIASTINOSCOPY N/A 01/23/2013   Procedure: MEDIASTINOSCOPY;  Surgeon: Maude Fleeta Ochoa, MD;  Location: [facility] OR;  Service: Thoracic;  Laterality: N/A;   QUADRICEPS TENDON REPAIR Right 01/13/2016   Procedure: REPAIR QUADRICEP TENDON;  Surgeon: Tanda Heading, MD;  Location: [facility] ORS;  Service: Orthopedics;  Laterality: Right;  Received femoral nerve block in holding   SHOULDER SURGERY     x 2   TONSILLECTOMY     VIDEO BRONCHOSCOPY WITH ENDOBRONCHIAL ULTRASOUND N/A 01/23/2013   Procedure: VIDEO BRONCHOSCOPY WITH ENDOBRONCHIAL ULTRASOUND;  Surgeon: Maude Fleeta Ochoa, MD;  Location: [facility] OR;  Service: Thoracic;  Laterality: N/A;   FAMILY HISTORY Family History  Problem Relation Age of Onset   Liver disease Mother        fatty liver   Diabetes Father    Heart disease Father        CABG at 31   Colon cancer Neg Hx    Stomach cancer Neg Hx    Pancreatic cancer Neg Hx    Esophageal cancer Neg Hx    SOCIAL HISTORY Social History   Tobacco Use   Smoking status: Never   Smokeless tobacco: Never   Vaping Use   Vaping status: Never Used  Substance Use Topics   Alcohol use: No   Drug use: No       OPHTHALMIC EXAM:  Base Eye Exam     Visual Acuity (Snellen - Linear)       Right Left   Dist cc HM 20/20    Correction: Glasses         Tonometry (Tonopen, 8:26 AM)       Right Left   Pressure 13 11         Pupils  Pupils Dark Light Shape React APD   Right PERRL 4 3 Round Brisk None   Left PERRL 4 3 Round Brisk None         Visual Fields       Left Right    Full    Restrictions  Total superior temporal, inferior temporal, inferior nasal deficiencies         Extraocular Movement       Right Left    Full, Ortho Full, Ortho         Neuro/Psych     Oriented x3: Yes   Mood/Affect: Normal         Dilation     Both eyes: 2.5% Phenylephrine  @ 8:26 AM           Slit Lamp and Fundus Exam     Slit Lamp Exam       Right Left   Lids/Lashes Dermatochalasis - upper lid, mild Meibomian gland dysfunction Dermatochalasis - upper lid, mild Meibomian gland dysfunction   Conjunctiva/Sclera White and quiet White and quiet   Cornea Trace Debris in tear film Trace Debris in tear film   Anterior Chamber Deep and quiet, no cell or flare Deep and quiet   Iris Round and dilated, No NVI Round and dilated, No NVI   Lens 2+ Nuclear sclerosis, 2+ Cortical cataract 2+ Nuclear sclerosis, 2+ Cortical cataract   Anterior Vitreous Mild Vitreous syneresis, mild cell/pigment, Posterior vitreous detachment, vitreous condensations Mild Vitreous syneresis         Fundus Exam       Right Left   Disc 3+ Pallor, mild fibrosis, +PPA, +cupping Pink and Sharp, trace focal fibrosis at nasal rim   C/D Ratio 0.8 0.3   Macula Blunted foveal reflex, large central pigment clump, RPE mottling, clumping and atrophy, +edema, scattered exudate, +IRH inferiorly -stably improved, pre-retinal fibrosis temporal macula, scattered sub-retinal fibrosis Flat, Good foveal reflex, No  heme or edema, RPE mottling   Vessels attenuation, +sheathing, Tortuous attenuation, mild Tortuousity, AV crossing changes   Periphery Attached, scattered pigment clumping, sclerotic vessels with sheathing, focal cluster of DBH at 0315 with surrounding circinate exudate - staly improved, pigmented fibrotic scarring superiorly, +edema, good 360 PRP changes Attached; no heme or exudate           Refraction     Wearing Rx       Sphere Cylinder Axis   Right [place]     Left -1.00 +1.00 175           IMAGING AND PROCEDURES  Imaging and Procedures for @TODAY @  OCT, Retina - OU - Both Eyes       Right Eye Quality was good. Central Foveal Thickness: 268. Progression has been stable. Findings include no SRF, abnormal foveal contour, subretinal hyper-reflective material, intraretinal hyper-reflective material, epiretinal membrane, intraretinal fluid, outer retinal atrophy, preretinal fibrosis (Coats Disease with diffuse atrophy -- stable improvement in peripheral IRF/IRHM and edema).   Left Eye Quality was good. Central Foveal Thickness: 293. Progression has been stable. Findings include normal foveal contour, no IRF, no SRF, vitreomacular adhesion .   Notes *Images captured and stored on drive  Diagnosis / Impression:  OD: Coats Disease with diffuse atrophy -- stable improvement in peripheral IRF/IRHM and edema OS: NFP, no IRF/SRF  Clinical management:  See below  Abbreviations: NFP - Normal foveal profile. CME - cystoid macular edema. PED - pigment epithelial detachment. IRF - intraretinal fluid. SRF - subretinal fluid. EZ - ellipsoid zone. ERM -  epiretinal membrane. ORA - outer retinal atrophy. ORT - outer retinal tubulation. SRHM - subretinal hyper-reflective material            ASSESSMENT/PLAN:    ICD-10-CM   1. Coats' disease of right eye  H35.021     2. Proliferative retinopathy, non-diabetic, right  H35.21 OCT, Retina - OU - Both Eyes    3. Retinal  neovascularization of right eye  H35.051     4. Iris neovascularization, right  H21.1X1     5. Neovascular glaucoma of right eye, severe stage  H40.51X3     6. Essential hypertension  I10     7. Hypertensive retinopathy of both eyes  H35.033     8. Diabetes mellitus type 2 without retinopathy (HCC)  E11.9     9. Combined forms of age-related cataract of both eyes  H25.813      1. Coats Disease OD - Pt reports longstanding low vision and inflammation which resulted in low vision since early childhood  - mother reports early diagnosis of Coats Disease in pt  - denies eye pain - exam shows extensive vascular sheathing and exudative disease -- stable from prior - OCT shows Coats Disease with diffuse atrophy -- stable improvement in peripheral IRF/IRHM and edema - FA 1.24.20 shows severe peripheral vascular nonperfusion and leakage at border of perfusion/nonperfusion  - s/p PRP OD (01.20.21)  - FA 3.24.21 shows interval improvement in vascular leakage post PRP laser  - f/u 1 year, DFE, OCT, optos color/FAF  2-5. Proliferative retinopathy w/ retinal and iris neovascularization and Neovascular Glaucoma OD  - secondary to long standing retinal vasculitis / Coats disease - IOP 13 today -- on Combigan  per Dr. Mevelyn -- pt states using drop about once a week  - s/p PRP OD as above  - exam shows stable regression of NVI and retinal NV  - discussed findings  - f/u in 1 year  6,7. Hypertensive retinopathy OU  - discussed importance of tight BP control  - monitor  8. Diabetes mellitus, type 2 without retinopathy  - A1C 6.6 (11.13.24) - The incidence, risk factors for progression, natural history and treatment options for diabetic retinopathy  were discussed with patient.   - The need for close monitoring of blood glucose, blood pressure, and serum lipids, avoiding cigarette or any type of tobacco, and the need for long term follow up was also discussed with patient.  - monitor  9.  Mixed form age related cataract OU - The symptoms of cataract, surgical options, and treatments and risks were discussed with patient.  - discussed diagnosis and progression  - monitor for now  Ophthalmic Meds Ordered this visit:  No orders of the defined types were placed in this encounter.    Return in about 1 year (around 09/06/2024) for f/u Coats Disease OD, DFE, OCT.  There are no Patient Instructions on file for this visit.  Explained the diagnoses, plan, and follow up with the patient and they expressed understanding.  Patient expressed understanding of the importance of proper follow up care.   This document serves as a record of services personally performed by Redell JUDITHANN Hans, MD, PhD. It was created on their behalf by Alan PARAS. Delores, OA an ophthalmic technician. The creation of this record is the provider's dictation and/or activities during the visit.    Electronically signed by: Alan PARAS. Delores, OA 09/07/23 5:39 PM  This document serves as a record of services personally performed by Redell JUDITHANN Hans,  MD, PhD. It was created on their behalf by Wanda GEANNIE Keens, COT an ophthalmic technician. The creation of this record is the provider's dictation and/or activities during the visit.    Electronically signed by:  Wanda GEANNIE Keens, COT  09/07/23 5:39 PM  Redell JUDITHANN Hans, M.D., Ph.D. Diseases & Surgery of the Retina and Vitreous [facility]  I have reviewed the above documentation for accuracy and completeness, and I agree with the above. Redell JUDITHANN Hans, M.D., Ph.D. 09/07/23 5:40 PM   Abbreviations: M myopia (nearsighted); A astigmatism; H hyperopia (farsighted); P presbyopia; Mrx spectacle prescription;  CTL contact lenses; OD right eye; OS left eye; OU both eyes  XT exotropia; ET esotropia; PEK punctate epithelial keratitis; PEE punctate epithelial erosions; DES dry eye syndrome; MGD meibomian gland dysfunction; ATs artificial tears; PFAT's  preservative free artificial tears; NSC nuclear sclerotic cataract; PSC posterior subcapsular cataract; ERM epi-retinal membrane; PVD posterior vitreous detachment; RD retinal detachment; DM diabetes mellitus; DR diabetic retinopathy; NPDR non-proliferative diabetic retinopathy; PDR proliferative diabetic retinopathy; CSME clinically significant macular edema; DME diabetic macular edema; dbh dot blot hemorrhages; CWS cotton wool spot; POAG primary open angle glaucoma; C/D cup-to-disc ratio; HVF humphrey visual field; GVF goldmann visual field; OCT optical coherence tomography; IOP intraocular pressure; BRVO Branch retinal vein occlusion; CRVO central retinal vein occlusion; CRAO central retinal artery occlusion; BRAO branch retinal artery occlusion; RT retinal tear; SB scleral buckle; PPV pars plana vitrectomy; VH Vitreous hemorrhage; PRP panretinal laser photocoagulation; IVK intravitreal kenalog; VMT vitreomacular traction; MH Macular hole;  NVD neovascularization of the disc; NVE neovascularization elsewhere; AREDS age related eye disease study; ARMD age related macular degeneration; POAG primary open angle glaucoma; EBMD epithelial/anterior basement membrane dystrophy; ACIOL anterior chamber intraocular lens; IOL intraocular lens; PCIOL posterior chamber intraocular lens; Phaco/IOL phacoemulsification with intraocular lens placement; PRK photorefractive keratectomy; LASIK laser assisted in situ keratomileusis; HTN hypertension; DM diabetes mellitus; COPD chronic obstructive pulmonary disease

## 2023-09-07 ENCOUNTER — Encounter: Payer: Self-pay | Admitting: Internal Medicine

## 2023-09-07 ENCOUNTER — Encounter (INDEPENDENT_AMBULATORY_CARE_PROVIDER_SITE_OTHER): Payer: Self-pay | Admitting: Ophthalmology

## 2023-09-07 ENCOUNTER — Ambulatory Visit: Payer: 59 | Attending: Cardiovascular Disease

## 2023-09-07 ENCOUNTER — Ambulatory Visit (INDEPENDENT_AMBULATORY_CARE_PROVIDER_SITE_OTHER): Payer: 59 | Admitting: Ophthalmology

## 2023-09-07 DIAGNOSIS — H35033 Hypertensive retinopathy, bilateral: Secondary | ICD-10-CM

## 2023-09-07 DIAGNOSIS — E119 Type 2 diabetes mellitus without complications: Secondary | ICD-10-CM

## 2023-09-07 DIAGNOSIS — H211X1 Other vascular disorders of iris and ciliary body, right eye: Secondary | ICD-10-CM | POA: Diagnosis not present

## 2023-09-07 DIAGNOSIS — H35051 Retinal neovascularization, unspecified, right eye: Secondary | ICD-10-CM | POA: Diagnosis not present

## 2023-09-07 DIAGNOSIS — H25813 Combined forms of age-related cataract, bilateral: Secondary | ICD-10-CM

## 2023-09-07 DIAGNOSIS — I10 Essential (primary) hypertension: Secondary | ICD-10-CM | POA: Diagnosis not present

## 2023-09-07 DIAGNOSIS — R002 Palpitations: Secondary | ICD-10-CM

## 2023-09-07 DIAGNOSIS — H4051X3 Glaucoma secondary to other eye disorders, right eye, severe stage: Secondary | ICD-10-CM | POA: Diagnosis not present

## 2023-09-07 DIAGNOSIS — I493 Ventricular premature depolarization: Secondary | ICD-10-CM

## 2023-09-07 DIAGNOSIS — H3521 Other non-diabetic proliferative retinopathy, right eye: Secondary | ICD-10-CM

## 2023-09-07 DIAGNOSIS — H35021 Exudative retinopathy, right eye: Secondary | ICD-10-CM | POA: Diagnosis not present

## 2023-09-07 DIAGNOSIS — R001 Bradycardia, unspecified: Secondary | ICD-10-CM

## 2023-09-07 DIAGNOSIS — I480 Paroxysmal atrial fibrillation: Secondary | ICD-10-CM

## 2023-09-12 ENCOUNTER — Other Ambulatory Visit: Payer: Self-pay | Admitting: Cardiovascular Disease

## 2023-09-12 DIAGNOSIS — I480 Paroxysmal atrial fibrillation: Secondary | ICD-10-CM

## 2023-09-12 NOTE — Telephone Encounter (Signed)
 Prescription refill request for Eliquis received. Indication:afib Last office visit:11/24 Scr:1.1  11/24 Age: 63 Weight:88.7  kg  Prescription refilled

## 2023-09-15 ENCOUNTER — Ambulatory Visit: Payer: 59 | Attending: Cardiovascular Disease | Admitting: Cardiovascular Disease

## 2023-09-15 ENCOUNTER — Encounter: Payer: Self-pay | Admitting: Cardiovascular Disease

## 2023-09-15 VITALS — BP 124/76 | HR 67 | Ht 66.0 in | Wt 196.2 lb

## 2023-09-15 DIAGNOSIS — R001 Bradycardia, unspecified: Secondary | ICD-10-CM | POA: Diagnosis not present

## 2023-09-15 DIAGNOSIS — I480 Paroxysmal atrial fibrillation: Secondary | ICD-10-CM

## 2023-09-15 NOTE — Progress Notes (Signed)
Cardiology Office Note:    Date:  09/15/2023   ID:  Jorge Peterson, DOB July 11, 1961, MRN [id number]  PCP:  Jerl Mina, MD   [facility] Providers Cardiologist:  None Electrophysiologist:  Maurice Small, MD     Referring MD: Jerl Mina, MD   Chief complaint: shortness of breath   History of Present Illness:    Jorge Peterson is a 63 y.o. male with a hx of HTN, HLD, DM, GERD, atrial fibrillation, and asymptomatic bradycardia. He is a former patient of Dr. Johney Frame.  He has had 2 A-fib ablations by Dr. Johney Frame 1 in 2012, and a second 2020.  His symptoms were fairly insidious -- shortness of breath and cough. It seems that he wasn't very aware of his AF until he had become more persistent, but at that time, he had developed mild CHF symptoms.  He was seen in EP clinic last month and noticed new onset fatigue and low O2 sats at home. A CT was performed that did not show PE or other obvious cause of his shortness of breath. TTE showed dilation of his aortic root to 4 cm. Coronary Ct showed mild obstructive disease. Heart monitor showed nocturnal bradycardia but no AF.    He returns today to review results from his 14 day monitor.  At the previous visit, I recommended a loop recorder to monitor and manage atrial fibrillation.  This was denied by his insurance company.  He began to have increased palpitations, so a 14-day patch monitor was placed.  There were multiple patient triggered events associated with PVCs.  The overall PVC burden was less than 1%  Over the past few months, he has begun to have episodes of rapid palpitations.  He has an Scientist, physiological, but it is not able to register his heart rate during these episodes.  He presents today(September 15, 2023) for follow-up on results of 30-day monitor.  He has transient episodes of lightheadedness.  He notes that his fluid intake may be inadequate.  He does notice his heart rate increase from time to time, up to greater than  100 bpm.  These episodes are self-limited.     EKGs/Labs/Other Studies Reviewed:     TTE 05/13/2022 Normal LV systolic function. Grade I diastolic dysfunction.  Coronary CT: 05/26/2022 Mild obstructive CAD 25-49%  Cardiac monitor 7 days: 05/2022 HR 32-132, avg 74. Slowest rate occurred with Mobitz II block, at 10:35 PM.  Cardiac monitor: ZioPatch 14 days 3//2024 Sinus rhythm rate 30-140, avg 72  Symptoms of skipped beats and fluttering were associated with an isolated PVCs.  PVC burden was < 1%   Cardiac monitor: 30 day 08/2023 Sinus rhythm predominates.  Heart rate 50 to 128 bpm, average 70 bpm No A-fib detected Ectopy burden less than 1% Symptom episodes correlated with sinus rhythm   EKG:  Last EKG results: Sinus rhythm, 1st degree AV block; voltage criteria for LVH   Recent Labs: 06/08/2023: BUN 13; Creatinine, Ser 1.01; Hemoglobin 14.2; Platelets 196; Potassium 3.5; Sodium 136          Physical Exam:    VS:  BP 124/76 (BP Location: Left Arm, Patient Position: Sitting, Cuff Size: Large)   Pulse 67   Ht 5\' 6"  (1.676 m)   Wt 196 lb 3.2 oz (89 kg)   SpO2 97%   BMI 31.67 kg/m     Wt Readings from Last 3 Encounters:  09/15/23 196 lb 3.2 oz (89 kg)  07/21/23 195 lb  9.6 oz (88.7 kg)  07/18/23 200 lb (90.7 kg)     GEN:  Well nourished, well developed in no acute distress CARDIAC: RRR, no murmurs, rubs, gallops RESPIRATORY:  Normal work of breathing MUSCULOSKELETAL: no edema    ASSESSMENT & PLAN:    Atrial fibrillation: s/p PVI 2012, 2020. Has been on flecainide in the past. Now occasionally having palpitations. ILR was declined by insurance in the past.  30-day monitor shows sinus rhythm and sinus tachycardia associated with symptoms.  Palpitations: associated with rare PVCs on monitor. Reassured  Bradycardia: occurs nocturnally on recent monitor. He had a sleep evaluation recently that did not show significant OSA.  Dyspnea, mild hypoxia: appears to be  resolving. I cannot identify a cardiac -- or particularly an arrhythmic cause  Alpha-gal allergy        Medication Adjustments/Labs and Tests Ordered: Current medicines are reviewed at length with the patient today.  Concerns regarding medicines are outlined above.  Orders Placed This Encounter  Procedures   EKG 12-Lead   No orders of the defined types were placed in this encounter.    Signed, Maurice Small, MD  09/15/2023 9:59 AM    [facility]

## 2023-09-15 NOTE — Patient Instructions (Signed)

## 2023-09-22 ENCOUNTER — Other Ambulatory Visit: Payer: Self-pay

## 2023-10-14 ENCOUNTER — Other Ambulatory Visit: Payer: Self-pay

## 2023-10-17 ENCOUNTER — Encounter: Payer: Self-pay | Admitting: Internal Medicine

## 2023-10-18 NOTE — Telephone Encounter (Signed)
Let Jorge Peterson know that I think it would be very reasonable to see an allergist because he could have other allergies we have not yet identified Please refer him to Dr. Irena Cords for evaluation  He should avoid milk products as well because some patients with alpha gal have an overlapping milk allergy due to the bovine proteins  He has responded favorably to rifaximin in the past which may help with the other gas issues; we could send rifaximin 550 mg 3 times daily x 14 days for IBS-D if he wishes

## 2023-10-19 ENCOUNTER — Other Ambulatory Visit: Payer: Self-pay

## 2023-10-19 ENCOUNTER — Telehealth: Payer: Self-pay | Admitting: Pharmacy Technician

## 2023-10-19 ENCOUNTER — Other Ambulatory Visit (HOSPITAL_COMMUNITY): Payer: Self-pay

## 2023-10-19 MED ORDER — RIFAXIMIN 550 MG PO TABS: 550.0000 mg | ORAL_TABLET | Freq: Three times a day (TID) | ORAL | 0 refills | Status: DC

## 2023-10-19 NOTE — Telephone Encounter (Signed)
Pharmacy Patient Advocate Encounter   Received notification from Fax that prior authorization for XIFAXAN 550MG  is required/requested.   Insurance verification completed.   The patient is insured through [facility] .   Per test claim: PA required; PA submitted to above mentioned insurance via CoverMyMeds Key/confirmation #/EOC [id number] Status is pending

## 2023-10-20 ENCOUNTER — Ambulatory Visit (INDEPENDENT_AMBULATORY_CARE_PROVIDER_SITE_OTHER): Payer: 59 | Admitting: Dermatology

## 2023-10-20 ENCOUNTER — Ambulatory Visit: Payer: 59 | Admitting: Dermatology

## 2023-10-20 ENCOUNTER — Encounter: Payer: Self-pay | Admitting: Dermatology

## 2023-10-20 DIAGNOSIS — L821 Other seborrheic keratosis: Secondary | ICD-10-CM

## 2023-10-20 DIAGNOSIS — W908XXA Exposure to other nonionizing radiation, initial encounter: Secondary | ICD-10-CM

## 2023-10-20 DIAGNOSIS — I781 Nevus, non-neoplastic: Secondary | ICD-10-CM

## 2023-10-20 DIAGNOSIS — Z1283 Encounter for screening for malignant neoplasm of skin: Secondary | ICD-10-CM

## 2023-10-20 DIAGNOSIS — Z872 Personal history of diseases of the skin and subcutaneous tissue: Secondary | ICD-10-CM

## 2023-10-20 DIAGNOSIS — L814 Other melanin hyperpigmentation: Secondary | ICD-10-CM | POA: Diagnosis not present

## 2023-10-20 DIAGNOSIS — D1801 Hemangioma of skin and subcutaneous tissue: Secondary | ICD-10-CM

## 2023-10-20 DIAGNOSIS — L578 Other skin changes due to chronic exposure to nonionizing radiation: Secondary | ICD-10-CM | POA: Diagnosis not present

## 2023-10-20 DIAGNOSIS — D229 Melanocytic nevi, unspecified: Secondary | ICD-10-CM

## 2023-10-20 DIAGNOSIS — Z7189 Other specified counseling: Secondary | ICD-10-CM

## 2023-10-20 NOTE — Patient Instructions (Signed)
 Recommend daily broad spectrum sunscreen SPF 30+ to sun-exposed areas, reapply every 2 hours as needed. Call for new or changing lesions.  Staying in the shade or wearing long sleeves, sun glasses (UVA+UVB protection) and wide brim hats (4-inch brim around the entire circumference of the hat) are also recommended for sun protection.      Melanoma ABCDEs  Melanoma is the most dangerous type of skin cancer, and is the leading cause of death from skin disease.  You are more likely to develop melanoma if you: Have light-colored skin, light-colored eyes, or red or blond hair Spend a lot of time in the sun Tan regularly, either outdoors or in a tanning bed Have had blistering sunburns, especially during childhood Have a close family member who has had a melanoma Have atypical moles or large birthmarks  Early detection of melanoma is key since treatment is typically straightforward and cure rates are extremely high if we catch it early.   The first sign of melanoma is often a change in a mole or a new dark spot.  The ABCDE system is a way of remembering the signs of melanoma.  A for asymmetry:  The two halves do not match. B for border:  The edges of the growth are irregular. C for color:  A mixture of colors are present instead of an even brown color. D for diameter:  Melanomas are usually (but not always) greater than 6mm - the size of a pencil eraser. E for evolution:  The spot keeps changing in size, shape, and color.  Please check your skin once per month between visits. You can use a small mirror in front and a large mirror behind you to keep an eye on the back side or your body.   If you see any new or changing lesions before your next follow-up, please call to schedule a visit.  Please continue daily skin protection including broad spectrum sunscreen SPF 30+ to sun-exposed areas, reapplying every 2 hours as needed when you're outdoors.   Staying in the shade or wearing long sleeves,  sun glasses (UVA+UVB protection) and wide brim hats (4-inch brim around the entire circumference of the hat) are also recommended for sun protection.  .    Due to recent changes in healthcare laws, you may see results of your pathology and/or laboratory studies on MyChart before the doctors have had a chance to review them. We understand that in some cases there may be results that are confusing or concerning to you. Please understand that not all results are received at the same time and often the doctors may need to interpret multiple results in order to provide you with the best plan of care or course of treatment. Therefore, we ask that you please give [place] 2 business days to thoroughly review all your results before contacting the office for clarification. Should we see a critical lab result, you will be contacted sooner.   If You Need Anything After Your Visit  If you have any questions or concerns for your doctor, please call our main line at [phone] and press option 4 to reach your doctor's medical assistant. If no one answers, please leave a voicemail as directed and we will return your call as soon as possible. Messages left after 4 pm will be answered the following business day.   You may also send [place] a message via MyChart. We typically respond to MyChart messages within 1-2 business days.  For prescription refills, please ask your  pharmacy to contact our office. Our fax number is [phone].  If you have an urgent issue when the clinic is closed that cannot wait until the next business day, you can page your doctor at the number below.    Please note that while we do our best to be available for urgent issues outside of office hours, we are not available 24/7.   If you have an urgent issue and are unable to reach [place], you may choose to seek medical care at your doctor's office, retail clinic, urgent care center, or emergency room.  If you have a medical emergency, please  immediately call 911 or go to the emergency department.  Pager Numbers  - Dr. Gwen Pounds: [phone]  - Dr. Roseanne Reno: [phone]  - Dr. Katrinka Blazing: [phone]   In the event of inclement weather, please call our main line at [phone] for an update on the status of any delays or closures.  Dermatology Medication Tips: Please keep the boxes that topical medications come in in order to help keep track of the instructions about where and how to use these. Pharmacies typically print the medication instructions only on the boxes and not directly on the medication tubes.   If your medication is too expensive, please contact our office at [phone] option 4 or send [place] a message through MyChart.   We are unable to tell what your co-pay for medications will be in advance as this is different depending on your insurance coverage. However, we may be able to find a substitute medication at lower cost or fill out paperwork to get insurance to cover a needed medication.   If a prior authorization is required to get your medication covered by your insurance company, please allow [place] 1-2 business days to complete this process.  Drug prices often vary depending on where the prescription is filled and some pharmacies may offer cheaper prices.  The website www.goodrx.com contains coupons for medications through different pharmacies. The prices here do not account for what the cost may be with help from insurance (it may be cheaper with your insurance), but the website can give you the price if you did not use any insurance.  - You can print the associated coupon and take it with your prescription to the pharmacy.  - You may also stop by our office during regular business hours and pick up a GoodRx coupon card.  - If you need your prescription sent electronically to a different pharmacy, notify our office through [facility] or by phone at [phone] option 4.     Si Usted Necesita Algo  Despus de Su Visita  Tambin puede enviarnos un mensaje a travs de Clinical cytogeneticist. Por lo general respondemos a los mensajes de MyChart en el transcurso de 1 a 2 das hbiles.  Para renovar recetas, por favor pida a su farmacia que se ponga en contacto con nuestra oficina. Annie Sable de fax es [place] [phone].  Si tiene un asunto urgente cuando la clnica est cerrada y que no puede esperar hasta el siguiente da hbil, puede llamar/localizar a su doctor(a) al nmero que aparece a continuacin.   Por favor, tenga en cuenta que aunque hacemos todo lo posible para estar disponibles para asuntos urgentes fuera del horario de [place], no estamos disponibles las 24 horas del da, los 7 [address]  [address] de la [place].   Si tiene un problema urgente y no puede comunicarse con nosotros, puede optar por buscar atencin mdica  en el consultorio de su doctor(a),  en una clnica privada, en un centro de atencin urgente o en una sala de emergencias.  Si tiene Engineer, drilling, por favor llame inmediatamente al 911 o vaya a la sala de emergencias.  Nmeros de bper  - Dr. Gwen Pounds: [phone]  - Dra. Roseanne Reno: [phone]  - Dr. Katrinka Blazing: [phone]   En caso de inclemencias del tiempo, por favor llame a Lacy Duverney principal al [phone] para una actualizacin sobre el [place] de cualquier retraso o cierre.  Consejos para la medicacin en dermatologa: Por favor, guarde las cajas en las que vienen los medicamentos de uso tpico para ayudarle a seguir las instrucciones sobre dnde y cmo usarlos. Las farmacias generalmente imprimen las instrucciones del medicamento slo en las cajas y no directamente en los tubos del [place].   Si su medicamento es muy caro, por favor, pngase en contacto con Rolm Gala llamando al [phone] y presione la opcin 4 o envenos un mensaje a travs de Clinical cytogeneticist.   No podemos decirle cul ser su copago por los medicamentos por adelantado ya que esto es diferente  dependiendo de la cobertura de su seguro. Sin embargo, es posible que podamos encontrar un medicamento sustituto a Audiological scientist un formulario para que el seguro cubra el medicamento que se considera necesario.   Si se requiere una autorizacin previa para que su compaa de seguros [place] su medicamento, por favor permtanos de 1 a 2 das hbiles para completar [address]  Los precios de los medicamentos varan con frecuencia dependiendo del Environmental consultant de dnde se surte la receta y alguna farmacias pueden ofrecer precios ms baratos.  El sitio web www.goodrx.com tiene cupones para medicamentos de Health and safety inspector. Los precios aqu no tienen en cuenta lo que podra costar con la ayuda del seguro (puede ser ms barato con su seguro), pero el sitio web puede darle el precio si no utiliz Tourist information centre manager.  - Puede imprimir el cupn correspondiente y llevarlo con su receta a la farmacia.  - Tambin puede pasar por nuestra oficina durante el horario de atencin regular y Education officer, museum una tarjeta de cupones de GoodRx.  - Si necesita que su receta se enve electrnicamente a una farmacia diferente, informe a nuestra oficina a travs de MyChart de [place] o por telfono llamando al [phone] y presione la opcin 4.

## 2023-10-20 NOTE — Progress Notes (Signed)
 Follow-Up Visit   Subjective  Jorge Peterson is a 63 y.o. male who presents for the following: Skin Cancer Screening and Full Body Skin Exam. No personal Hx of skin cancer. Hx of actinic keratoses.   The patient presents for Total-Body Skin Exam (TBSE) for skin cancer screening and mole check. The patient has spots, moles and lesions to be evaluated, some may be new or changing and the patient may have concern these could be cancer.  The following portions of the chart were reviewed this encounter and updated as appropriate: medications, allergies, medical history  Review of Systems:  No other skin or systemic complaints except as noted in HPI or Assessment and Plan.  Objective  Well appearing patient in no apparent distress; mood and affect are within normal limits.  A full examination was performed including scalp, head, eyes, ears, nose, lips, neck, chest, axillae, abdomen, back, buttocks, bilateral upper extremities, bilateral lower extremities, hands, feet, fingers, toes, fingernails, and toenails. All findings within normal limits unless otherwise noted below.   Relevant physical exam findings are noted in the Assessment and Plan.   Assessment & Plan   SKIN CANCER SCREENING PERFORMED TODAY.  ACTINIC DAMAGE - Chronic condition, secondary to cumulative UV/sun exposure - diffuse scaly erythematous macules with underlying dyspigmentation - Recommend daily broad spectrum sunscreen SPF 30+ to sun-exposed areas, reapply every 2 hours as needed.  - Staying in the shade or wearing long sleeves, sun glasses (UVA+UVB protection) and wide brim hats (4-inch brim around the entire circumference of the hat) are also recommended for sun protection.  - Call for new or changing lesions.  HISTORY OF PRECANCEROUS ACTINIC KERATOSIS - site(s) of PreCancerous Actinic Keratosis clear today. - these may recur and new lesions may form requiring treatment to prevent transformation into skin  cancer - observe for new or changing spots and contact [facility] for appointment if occur - photoprotection with sun protective clothing; sunglasses and broad spectrum sunscreen with SPF of at least 30 + and frequent self skin exams recommended - yearly exams by a dermatologist recommended for persons with history of PreCancerous Actinic Keratoses  LENTIGINES, SEBORRHEIC KERATOSES, HEMANGIOMAS - Benign normal skin lesions - Benign-appearing - Call for any changes  MELANOCYTIC NEVI - Tan-brown and/or pink-flesh-colored symmetric macules and papules - Benign appearing on exam today - Observation - Call clinic for new or changing moles - Recommend daily use of broad spectrum spf 30+ sunscreen to sun-exposed areas.   TELANGIECTASIAS Exam: dilated blood vessels at cheeks Treatment Plan: Benign appearing on exam Call for changes Counseling for BBL / IPL / Laser and Coordination of Care Discussed the treatment option of Broad Band Light (BBL) /Intense Pulsed Light (IPL)/ Laser for skin discoloration, including brown spots and redness.  Typically we recommend at least 1-3 treatment sessions about 5-8 weeks apart for best results.  Cannot have tanned skin when BBL performed, and regular use of sunscreen/photoprotection is advised after the procedure to help maintain results. The patient's condition may also require "maintenance treatments" in the future.  The fee for BBL / laser treatments is $350 per treatment session for the whole face.  A fee can be quoted for other parts of the body.  Insurance typically does not pay for BBL/laser treatments and therefore the fee is an out-of-pocket cost. Recommend prophylactic valtrex treatment. Once scheduled for procedure, will send Rx in prior to patient's appointment.   SEBORRHEIC KERATOSIS - Stuck-on, waxy, tan-brown papules and/or plaques at chest and  right face - Benign-appearing - Discussed benign etiology and prognosis. - Observe - Call  for any changes - Patient deferred treatment at this time. ACTINIC SKIN DAMAGE   SKIN CANCER SCREENING   LENTIGO   MELANOCYTIC NEVUS, UNSPECIFIED LOCATION   TELANGIECTASIA   Return in about 1 year (around 10/19/2024) for TBSE, HxAKs.  I, Lawson Radar, CMA, am acting as scribe for Armida Sans, MD.   Documentation: I have reviewed the above documentation for accuracy and completeness, and I agree with the above.  Armida Sans, MD

## 2023-10-21 NOTE — Telephone Encounter (Signed)
Pts xifaxan has been denied. Please advise. Reason in note below.

## 2023-10-21 NOTE — Telephone Encounter (Signed)
Pharmacy Patient Advocate Encounter  Received notification from [facility] that Prior Authorization for Xifaxan has been DENIED.  Full denial letter will be uploaded to the media tab. See denial reason below.   PA #/Case ID/Reference #: The request for coverage for XIFAXAN TAB 550MG , use as directed (42 per 14 days), is denied. This decision is based on health plan criteria for XIFAXAN TAB 550MG . This medicine is covered only if: All of the following: (A) You have a history of failure, contraindication, or intolerance to a tricyclic antidepressant (for example, amitriptyline). (B) One of the following: (I) You have a history of failure, contraindication or intolerance to Viberzi*. (II) You have a history of or potential for a substance abuse disorder

## 2023-10-23 NOTE — Telephone Encounter (Signed)
 Substitute augmentin 875 mg BID x 7 days

## 2023-10-24 ENCOUNTER — Encounter: Payer: Self-pay | Admitting: Dermatology

## 2023-10-24 MED ORDER — AMOXICILLIN-POT CLAVULANATE 875-125 MG PO TABS
1.0000 | ORAL_TABLET | Freq: Two times a day (BID) | ORAL | 0 refills | Status: AC
Start: 2023-10-24 — End: 2023-10-31

## 2023-10-24 NOTE — Telephone Encounter (Signed)
 Augmentin RX sent, patient notified via MyChart.

## 2023-10-24 NOTE — Addendum Note (Signed)
 Addended by: Marisa Sprinkles on: 10/24/2023 09:36 AM   Modules accepted: Orders

## 2023-11-06 ENCOUNTER — Other Ambulatory Visit: Payer: Self-pay

## 2023-11-07 ENCOUNTER — Telehealth: Payer: Self-pay

## 2023-11-07 ENCOUNTER — Other Ambulatory Visit: Payer: Self-pay

## 2023-11-07 NOTE — Telephone Encounter (Signed)
 Pt has been scheduled to see Dr. Blair Callas 11/21/23 at 7:45am, pt notified by their office regarding appt.

## 2023-11-10 ENCOUNTER — Other Ambulatory Visit: Payer: Self-pay | Admitting: Internal Medicine

## 2023-12-13 ENCOUNTER — Other Ambulatory Visit: Payer: Self-pay

## 2023-12-16 ENCOUNTER — Other Ambulatory Visit: Payer: Self-pay

## 2024-01-02 ENCOUNTER — Encounter: Payer: Self-pay | Admitting: Internal Medicine

## 2024-01-03 NOTE — Telephone Encounter (Signed)
 Augmentin  875 mg twice daily x 10 days for SIBO  Then I will recommend he start VSL number 3 capsules 1 twice daily

## 2024-01-04 ENCOUNTER — Other Ambulatory Visit: Payer: Self-pay

## 2024-01-04 MED ORDER — AMOXICILLIN-POT CLAVULANATE 875-125 MG PO TABS: 1.0000 | ORAL_TABLET | Freq: Two times a day (BID) | ORAL | 0 refills | Status: AC

## 2024-01-17 ENCOUNTER — Other Ambulatory Visit: Payer: Self-pay

## 2024-02-14 ENCOUNTER — Other Ambulatory Visit: Payer: Self-pay | Admitting: Cardiovascular Disease

## 2024-02-14 DIAGNOSIS — I480 Paroxysmal atrial fibrillation: Secondary | ICD-10-CM

## 2024-02-14 NOTE — Telephone Encounter (Signed)
 Prescription refill request for Eliquis  received. Indication: Afib  Last office visit: 09/15/23 ([facility])  Scr: 1.0 (01/03/24)  Age: 63 Weight: 89kg  Appropriate dose. Refill sent.

## 2024-02-16 ENCOUNTER — Other Ambulatory Visit: Payer: Self-pay

## 2024-02-16 MED ORDER — DULAGLUTIDE 3 MG/0.5ML ~~LOC~~ SOAJ
3.0000 mg | SUBCUTANEOUS | 5 refills | Status: DC
  Filled 2024-02-16: qty 2, 28d supply, fill #0
  Filled 2024-03-29: qty 2, 28d supply, fill #1
  Filled 2024-04-25: qty 2, 28d supply, fill #2
  Filled 2024-05-28: qty 2, 28d supply, fill #3
  Filled 2024-06-23: qty 2, 28d supply, fill #4
  Filled 2024-07-22 – 2024-08-02 (×2): qty 2, 28d supply, fill #5

## 2024-02-18 ENCOUNTER — Other Ambulatory Visit: Payer: Self-pay | Admitting: Internal Medicine

## 2024-03-29 ENCOUNTER — Other Ambulatory Visit: Payer: Self-pay

## 2024-05-14 ENCOUNTER — Other Ambulatory Visit: Payer: Self-pay

## 2024-05-28 ENCOUNTER — Other Ambulatory Visit: Payer: Self-pay

## 2024-06-14 ENCOUNTER — Other Ambulatory Visit: Payer: Self-pay | Admitting: Thoracic Surgery (Cardiothoracic Vascular Surgery)

## 2024-06-14 DIAGNOSIS — I7121 Aneurysm of the ascending aorta, without rupture: Secondary | ICD-10-CM

## 2024-06-29 ENCOUNTER — Other Ambulatory Visit: Payer: Self-pay | Admitting: Cardiovascular Disease

## 2024-07-05 ENCOUNTER — Ambulatory Visit (HOSPITAL_COMMUNITY)
Admission: RE | Admit: 2024-07-05 | Discharge: 2024-07-05 | Disposition: A | Discharge status: Home / Self Care | Payer: Self-pay | Source: Ambulatory Visit | Attending: Cardiovascular Disease | Admitting: Cardiovascular Disease

## 2024-07-05 DIAGNOSIS — I7121 Aneurysm of the ascending aorta, without rupture: Secondary | ICD-10-CM | POA: Insufficient documentation

## 2024-07-05 LAB — POCT I-STAT CREATININE: Creatinine, Ser: 1.1 mg/dL (ref 0.61–1.24)

## 2024-07-05 MED ORDER — IOHEXOL 350 MG/ML SOLN
75.0000 mL | Freq: Once | INTRAVENOUS | Status: AC | PRN
  Administered 2024-07-05: 75 mL via INTRAVENOUS

## 2024-07-25 ENCOUNTER — Ambulatory Visit

## 2024-07-25 VITALS — BP 135/88 | HR 62 | Resp 18 | Ht 66.0 in | Wt 187.0 lb

## 2024-07-25 DIAGNOSIS — I7121 Aneurysm of the ascending aorta, without rupture: Secondary | ICD-10-CM

## 2024-07-25 NOTE — Patient Instructions (Signed)

## 2024-07-25 NOTE — Progress Notes (Signed)
 [address] [place] [address]             [phone]            Jorge Peterson [id number] 05/08/1961   History of Present Illness:  Jorge Peterson is a 63 year old man with medical history of hypertension, atrial fibrillation, diverticulitis, and hyperlipidemia who presents for continued follow up aortic root dilatation. On recent CTA scan of chest aortic root measured stable in size at 4.4 cm. Echocardiogram showed tricuspid aortic valve.   He presents to the clinic today with his wife and reports that he has been doing well.  His blood pressure is well controlled with current medication therapy.  His home readings are 110s-120/70s.  He does exercise and is active with yard work.  He denies chest pain, shortness of breath and lower leg edema.     Current Outpatient Medications on File Prior to Visit  Medication Sig Dispense Refill   acetaminophen  (TYLENOL ) 500 MG tablet Take 2 tablets (1,000 mg total) by mouth every 6 (six) hours as needed. 30 tablet 0   albuterol  (VENTOLIN  HFA) 108 (90 Base) MCG/ACT inhaler INHALE 2 PUFFS BY MOUTH EVERY 6 HOURS AS NEEDED FOR WHEEZE     amoxicillin -clavulanate (AUGMENTIN ) 875-125 MG tablet Take 1 tablet by mouth 2 (two) times daily. 20 tablet 0   brimonidine -timolol (COMBIGAN ) 0.2-0.5 % ophthalmic solution      Dulaglutide  (TRULICITY ) 3 MG/0.5ML SOAJ Inject 0.5 mLs (3 mg total) subcutaneously every 7 (seven) days 2 mL 5   ELIQUIS  5 MG TABS tablet TAKE 1 TABLET(5 MG) BY MOUTH TWICE DAILY 60 tablet 5   finasteride (PROSCAR) 5 MG tablet Take by mouth.     fluticasone  (FLONASE ) 50 MCG/ACT nasal spray Place 1 spray into the nose daily as needed for allergies.      folic acid  (FOLVITE ) 400 MCG tablet Take 400 mcg by mouth daily.     hydrochlorothiazide  (HYDRODIURIL ) 25 MG tablet TAKE 1 TABLET(25 MG) BY MOUTH EVERY DAY     losartan  (COZAAR ) 100 MG tablet Take 100 mg by mouth every evening.   3   Multiple Vitamin  (MULTIVITAMIN WITH MINERALS) TABS tablet Take 1 tablet by mouth daily. Centrum Silver     rosuvastatin  (CRESTOR ) 10 MG tablet TAKE 1 TABLET(10 MG) BY MOUTH DAILY 90 tablet 1   traZODone  (DESYREL ) 50 MG tablet Take 50 mg by mouth every other day.     No current facility-administered medications on file prior to visit.     ROS: Review of Systems  Respiratory: Negative.  Negative for cough and shortness of breath.   Cardiovascular: Negative.  Negative for chest pain and leg swelling.     BP 135/88   Pulse 62   Resp 18   Ht 5' 6 (1.676 m)   Wt 187 lb (84.8 kg)   SpO2 97%   BMI 30.18 kg/m   Physical Exam Constitutional:      Appearance: Normal appearance.  HENT:     Head: Normocephalic and atraumatic.  Skin:    General: Skin is warm and dry.  Neurological:     General: No focal deficit present.     Mental Status: He is alert and oriented to person, place, and time.      Imaging: CLINICAL DATA:  Aortic aneurysm.   EXAM: CT ANGIOGRAPHY CHEST WITH CONTRAST   TECHNIQUE: Multidetector CT imaging of the chest was  performed using the standard protocol during bolus administration of intravenous contrast. Multiplanar CT image reconstructions and MIPs were obtained to evaluate the vascular anatomy.   RADIATION DOSE REDUCTION: This exam was performed according to the departmental dose-optimization program which includes automated exposure control, adjustment of the mA and/or kV according to patient size and/or use of iterative reconstruction technique.   CONTRAST:  75mL OMNIPAQUE  IOHEXOL  350 MG/ML SOLN   COMPARISON:  Chest CT dated 06/08/2023.   FINDINGS: Cardiovascular: There is no cardiomegaly or pericardial effusion. Stable appearance of the aortic root measuring up to 4.4 cm at the sinus of Valsalva. No aortic dissection. The origins of the great vessels of the aortic arch and the pulmonary arteries appear patent.   Mediastinum/Nodes: No hilar or mediastinal  adenopathy. The esophagus and the thyroid  gland are grossly unremarkable no mediastinal fluid collection.   Lungs/Pleura: Stable left upper lobe nodule likely a partially calcified granuloma. No focal consolidation, pleural effusion or pneumothorax. The central airways are patent.   Upper Abdomen: Cholecystectomy.   Musculoskeletal: No acute osseous pathology.   Review of the MIP images confirms the above findings.   IMPRESSION: 1. Stable appearance of the aortic root measuring up to 4.4 cm at the sinus of Valsalva. Recommend annual imaging followup by CTA or MRA. This recommendation follows 2010 ACCF/AHA/AATS/ACR/ASA/SCA/SCAI/SIR/STS/SVM Guidelines for the Diagnosis and Management of Patients with Thoracic Aortic Disease. Circulation. 2010; 121: Z733-z630. Aortic aneurysm NOS (ICD10-I71.9) 2. No acute intrathoracic pathology.     Electronically Signed   By: Vanetta Chou M.D.   On: 07/05/2024 13:47     A/P: Aneurysm of ascending aorta without rupture -4.4 cm aortic root dilatation on recent CTA scan of chest. Echocardiogram showed tricuspid aortic valve.  -We discussed the natural history and and risk factors for growth of ascending aortic aneurysms. Discussed recommendations to minimize the risk of further expansion or dissection including careful blood pressure control, avoidance of contact sports and heavy lifting, attention to lipid management.  We covered the importance of staying never user of tobacco.  The patient does not yet meet surgical criteria of >5.5cm. The patient is aware of signs and symptoms of aortic dissection and when to present to the emergency department    -Follow up in one year with CTA of chest for continued surveillance   Risk Modification:  Statin:  rosuvastatin   Smoking cessation instruction/counseling given:  never user  Patient was counseled on importance of Blood Pressure Control  They are instructed to contact their Primary Care  Physician if they start to have blood pressure readings over 130s/90s. Do not ever stop blood pressure medications on your own, unless instructed by healthcare professional.  Please avoid use of Fluoroquinolones as this can potentially increase your risk of Aortic Rupture and/or Dissection  Patient educated on signs and symptoms of Aortic Dissection, handout also provided in AVS  Manuelita CHRISTELLA Rough, PA-C 07/25/24

## 2024-08-02 ENCOUNTER — Other Ambulatory Visit: Payer: Self-pay

## 2024-08-08 ENCOUNTER — Telehealth: Payer: Self-pay | Admitting: Internal Medicine

## 2024-08-08 NOTE — Telephone Encounter (Signed)
 Dr Albertus- Patient was hoping to message you through MyChart but your name is not longer an option for him to select. He asked if you could review his results done through [facility] practice and weigh in.  His numbers are going down and he is curious what that may mean as far as dairy and/or  meat consumption. IF possible he asked if you could message him through MyChart so hopefully it would add you back into his directory in case he has questions down the road.       b Results - documented in this encounter  (ABNORMAL) Alpha-Gal IgE Panel - [facility] (07/12/2024 9:02 AM EST) Lab Results - (ABNORMAL) Alpha-Gal IgE Panel - [facility] (07/12/2024 9:02 AM EST) Component Value Ref Range Test Method Analysis Time Performed At Pathologist Signature  Class Description - [facility] Comment     07/14/2024 1:35 PM EST [facility]    Comment:    Levels of Specific IgE       Class  Description of Class    ---------------------------  -----  --------------------                   < 0.10         0         Negative           0.10 -    0.31         0/I       Equivocal/Low           0.32 -    0.55         I         Low           0.56 -    1.40         II        Moderate           1.41 -    3.90         III       High           3.91 -   19.00         IV        Very High          19.01 -  100.00         V         Very High                  >100.00         VI        Very High  Immunoglobulin E, Total - [facility] 55 6 - 495 IU/mL   07/14/2024 1:35 PM EST [facility]    F026-IgE Pork - [facility] <0.10 Class 0 kU/L   07/14/2024 1:35 PM EST [facility]    F027-IgE Beef - [facility] <0.10 Class 0 kU/L   07/14/2024 1:35 PM EST [facility]    F088-IgE Lamb - [facility] <0.10 Class 0 kU/L   07/14/2024 1:35 PM EST [facility]    Alpha-Gal IgE - [facility] 0.12 (A) Class 0/I kU/L   07/14/2024 1:35 PM EST [facility]     Lab Results - (ABNORMAL) Alpha-Gal IgE Panel - [facility] (07/12/2024 9:02 AM  EST) Specimen (Source) Anatomical Location / Laterality Collection Method / Volume Collection Time Received Time  Blood   Venipuncture / Unknown 07/12/2024 9:02 AM EST 07/12/2024 9:02 AM EST

## 2024-08-08 NOTE — Telephone Encounter (Signed)
 Inbound call from patient stating he would like to speak to nurse in regards to a test he had done.  Requesting a call back  Please advise  Thank you

## 2024-08-31 NOTE — Progress Notes (Signed)
 " [facility] - Clinic Note  09/10/2024     CHIEF COMPLAINT Patient presents for Retina Follow Up  HISTORY OF PRESENT ILLNESS: Jorge Peterson is a 64 y.o. male who presents to the clinic today for:  HPI     Retina Follow Up   In right eye.  This started 1 year ago.  Duration of 1 year.  Since onset it is stable.  I, the attending physician,  performed the HPI with the patient and updated documentation appropriately.        Comments   1 year retina follow up coates disease OD pt is reporting no vision changes noticed he denies any flashes or floaters last reading 109 this am A1C 7.4      Last edited by [name], MD on 09/16/2024  9:49 PM.    Patient states the vision the same. He was bite by a tick and now alpha-gal syndrome.   Referring physician: Valora Lynwood FALCON, MD [address] [place],  [place] [address]  HISTORICAL INFORMATION:   Selected notes from the MEDICAL RECORD NUMBER Referred by Dr. Mevelyn for retinal vasculitis OD   CURRENT MEDICATIONS: Current Outpatient Medications (Ophthalmic Drugs)  Medication Sig   brimonidine -timolol (COMBIGAN ) 0.2-0.5 % ophthalmic solution    No current facility-administered medications for this visit. (Ophthalmic Drugs)   Current Outpatient Medications (Other)  Medication Sig   acetaminophen  (TYLENOL ) 500 MG tablet Take 2 tablets (1,000 mg total) by mouth every 6 (six) hours as needed.   albuterol  (VENTOLIN  HFA) 108 (90 Base) MCG/ACT inhaler INHALE 2 PUFFS BY MOUTH EVERY 6 HOURS AS NEEDED FOR WHEEZE   amoxicillin -clavulanate (AUGMENTIN ) 875-125 MG tablet Take 1 tablet by mouth 2 (two) times daily.   Dulaglutide  (TRULICITY ) 3 MG/0.5ML SOAJ Inject 0.5 mLs (3 mg total) subcutaneously every 7 (seven) days   ELIQUIS  5 MG TABS tablet TAKE 1 TABLET(5 MG) BY MOUTH TWICE DAILY   finasteride (PROSCAR) 5 MG tablet Take by mouth.   fluticasone  (FLONASE ) 50 MCG/ACT nasal spray Place 1 spray into  the nose daily as needed for allergies.    folic acid  (FOLVITE ) 400 MCG tablet Take 400 mcg by mouth daily.   hydrochlorothiazide  (HYDRODIURIL ) 25 MG tablet TAKE 1 TABLET(25 MG) BY MOUTH EVERY DAY   losartan  (COZAAR ) 100 MG tablet Take 100 mg by mouth every evening.    Multiple Vitamin (MULTIVITAMIN WITH MINERALS) TABS tablet Take 1 tablet by mouth daily. Centrum Silver   rosuvastatin  (CRESTOR ) 10 MG tablet TAKE 1 TABLET(10 MG) BY MOUTH DAILY   traZODone  (DESYREL ) 50 MG tablet Take 50 mg by mouth every other day.   No current facility-administered medications for this visit. (Other)   REVIEW OF SYSTEMS: ROS   Positive for: Eyes Last edited by Resa Delon ORN, COT on 09/10/2024  8:52 AM.       ALLERGIES Allergies  Allergen Reactions   Alpha-Gal Other (See Comments)   Quinolones Other (See Comments)    Ascending thoracic aortic aneurysm, use with caution   Codeine Itching and Rash   Latex Itching and Rash   Oxycodone -Acetaminophen  Itching and Rash   PAST MEDICAL HISTORY Past Medical History:  Diagnosis Date   AAA (abdominal aortic aneurysm)    Allergy to alpha-gal 02/22/2023   Dx June 2024 -- advised to avoid all beef and pork of any kind   Atrial fibrillation (HCC)    s/p afib ablation 7/12 and 11/20   Bradycardia  asymptomatic   Colonic diverticular abscess    Diabetes (HCC)    Diverticulitis    Elevated LFTs    Fall    Family history of anesthesia complication    father gets pneumonia after    GERD (gastroesophageal reflux disease)    Hyperlipidemia    Hyperplastic colon polyp    Hypertension    Hypertensive retinopathy    OU   Internal hemorrhoids    Tinnitus    Tubular adenoma of colon    Wears glasses    Past Surgical History:  Procedure Laterality Date   ATRIAL ABLATION SURGERY  03/24/11   PVI by MILUS   ATRIAL FIBRILLATION ABLATION N/A 07/24/2019   Procedure: ATRIAL FIBRILLATION ABLATION;  Surgeon: Kelsie Agent, MD;  Location: [facility];  Service: Cardiovascular;  Laterality: N/A;   BACK SURGERY     BRONCHOSCOPY  01/23/2013    biopsy   CHOLECYSTECTOMY  05/02/2014   LAP CHOLE   CHOLECYSTECTOMY N/A 05/02/2014   Procedure: LAPAROSCOPIC CHOLECYSTECTOMY WITH INTRAOPERATIVE CHOLANGIOGRAM;  Surgeon: Debby Shipper, MD;  Location: [facility] OR;  Service: General;  Laterality: N/A;   EP IMPLANTABLE DEVICE N/A 06/27/2015   Procedure: Loop Recorder Insertion;  Surgeon: Agent Kelsie, MD;  Location: [facility];  Service: Cardiovascular;  Laterality: N/A;   ERCP N/A 05/25/2014   Procedure: ENDOSCOPIC RETROGRADE CHOLANGIOPANCREATOGRAPHY (ERCP);  Surgeon: Toribio SHAUNNA Cedar, MD;  Location: [facility] ENDOSCOPY;  Service: Endoscopy;  Laterality: N/A;   Fistula surgery     LOOP RECORDER REMOVAL N/A 07/24/2019   Procedure: LOOP RECORDER REMOVAL;  Surgeon: Kelsie Agent, MD;  Location: [facility];  Service: Cardiovascular;  Laterality: N/A;   MEDIASTINOSCOPY N/A 01/23/2013   Procedure: MEDIASTINOSCOPY;  Surgeon: Maude Fleeta Ochoa, MD;  Location: [facility] OR;  Service: Thoracic;  Laterality: N/A;   QUADRICEPS TENDON REPAIR Right 01/13/2016   Procedure: REPAIR QUADRICEP TENDON;  Surgeon: Tanda Heading, MD;  Location: [facility] ORS;  Service: Orthopedics;  Laterality: Right;  Received femoral nerve block in holding   SHOULDER SURGERY     x 2   TONSILLECTOMY     VIDEO BRONCHOSCOPY WITH ENDOBRONCHIAL ULTRASOUND N/A 01/23/2013   Procedure: VIDEO BRONCHOSCOPY WITH ENDOBRONCHIAL ULTRASOUND;  Surgeon: Maude Fleeta Ochoa, MD;  Location: [facility] OR;  Service: Thoracic;  Laterality: N/A;   FAMILY HISTORY Family History  Problem Relation Age of Onset   Liver disease Mother        fatty liver   Diabetes Father    Heart disease Father        CABG at 68   Colon cancer Neg Hx    Stomach cancer Neg Hx    Pancreatic cancer Neg Hx    Esophageal cancer Neg Hx    SOCIAL HISTORY Social History   Tobacco Use   Smoking status: Never   Smokeless tobacco: Never  Vaping Use    Vaping status: Never Used  Substance Use Topics   Alcohol use: No   Drug use: No       OPHTHALMIC EXAM:  Base Eye Exam     Visual Acuity (Snellen - Linear)       Right Left   Dist cc HM 20/20 -1         Tonometry (Tonopen, 8:55 AM)       Right Left   Pressure 14 16         Pupils       Pupils Dark Light Shape React APD   Right PERRL  4 3 Round Brisk None   Left PERRL 4 3 Round Brisk None         Visual Fields       Left Right    Full Full         Extraocular Movement       Right Left    Full, Ortho Full, Ortho         Neuro/Psych     Oriented x3: Yes   Mood/Affect: Normal         Dilation     Both eyes: 2.5% Phenylephrine  @ 8:55 AM           Slit Lamp and Fundus Exam     Slit Lamp Exam       Right Left   Lids/Lashes Dermatochalasis - upper lid, mild Meibomian gland dysfunction Dermatochalasis - upper lid, mild Meibomian gland dysfunction   Conjunctiva/Sclera White and quiet White and quiet   Cornea Trace Debris in tear film Trace Debris in tear film   Anterior Chamber Deep and quiet, no cell or flare Deep and quiet   Iris Round and dilated, No NVI Round and dilated, No NVI   Lens 2+ Nuclear sclerosis, 2+ Cortical cataract 2+ Nuclear sclerosis, 2+ Cortical cataract   Anterior Vitreous Mild Vitreous syneresis, mild cell/pigment, Posterior vitreous detachment, vitreous condensations Mild Vitreous syneresis         Fundus Exam       Right Left   Disc 3+ Pallor, mild fibrosis, +PPA, +cupping Pink and Sharp, trace focal fibrosis at nasal rim   C/D Ratio 0.8 0.3   Macula Blunted foveal reflex, large central pigment clump, RPE mottling, clumping and atrophy, +edema, scattered exudate, +IRH inferiorly -stably improved, pre-retinal fibrosis temporal macula, scattered sub-retinal fibrosis Flat, Good foveal reflex, No heme or edema, RPE mottling   Vessels attenuation, +sheathing, Tortuous, Coats disease attenuation, mild Tortuousity, AV  crossing changes   Periphery Attached, scattered pigment clumping, sclerotic vessels with sheathing, focal cluster of DBH at 0315 with surrounding circinate exudate - stably improved, pigmented fibrotic scarring superiorly, +edema, good 360 PRP changes Attached; no heme or exudate           Refraction     Wearing Rx       Sphere Cylinder Axis   Right [place]     Left -1.00 +1.00 175           IMAGING AND PROCEDURES  Imaging and Procedures for @TODAY @  OCT, Retina - OU - Both Eyes       Right Eye Quality was good. Central Foveal Thickness: 248. Progression has been stable. Findings include no SRF, abnormal foveal contour, subretinal hyper-reflective material, intraretinal hyper-reflective material, epiretinal membrane, intraretinal fluid, outer retinal atrophy, preretinal fibrosis (Coats Disease with diffuse atrophy -- stable improvement in peripheral IRF/IRHM and edema).   Left Eye Quality was good. Central Foveal Thickness: 289. Progression has been stable. Findings include normal foveal contour, no IRF, no SRF, vitreomacular adhesion .   Notes *Images captured and stored on drive  Diagnosis / Impression:  OD: Coats Disease with diffuse atrophy -- stable improvement in peripheral IRF/IRHM and edema OS: NFP, no IRF/SRF  Clinical management:  See below  Abbreviations: NFP - Normal foveal profile. CME - cystoid macular edema. PED - pigment epithelial detachment. IRF - intraretinal fluid. SRF - subretinal fluid. EZ - ellipsoid zone. ERM - epiretinal membrane. ORA - outer retinal atrophy. ORT - outer retinal tubulation. SRHM - subretinal hyper-reflective material  Color Fundus Photography Optos - OU - Both Eyes       Right Eye Progression has been stable. Disc findings include pallor. Macula : exudates, retinal pigment epithelium abnormalities. Vessels : attenuated (Coats disease with sheathing and perivascular exudation--slightly improved; interval improvement in  exudation). Periphery : hemorrhage, RPE abnormality (Interval improvement in exudates and IRH).   Left Eye Progression has been stable. Disc findings include normal observations. Macula : normal observations. Vessels : normal observations. Periphery : normal observations.   Notes **Images stored on drive**  Impression: OD: Coats disease with good 360 PRP, interval improvement in exudates and IRH OS: normal study, stable           ASSESSMENT/PLAN:    ICD-10-CM   1. Coats' disease of right eye  H35.021 OCT, Retina - OU - Both Eyes    Color Fundus Photography Optos - OU - Both Eyes    2. Proliferative retinopathy, non-diabetic, right  H35.21     3. Retinal neovascularization of right eye  H35.051     4. Iris neovascularization, right  H21.1X1     5. Neovascular glaucoma of right eye, severe stage  H40.51X3     6. Essential hypertension  I10     7. Hypertensive retinopathy of both eyes  H35.033     8. Diabetes mellitus type 2 without retinopathy (HCC)  E11.9     9. Combined forms of age-related cataract of both eyes  H25.813      1. Coats Disease OD - Pt reports longstanding low vision and inflammation which resulted in low vision since early childhood  - mother reports early diagnosis of Coats Disease in pt  - denies eye pain - exam shows extensive vascular sheathing and exudative disease -- stable from prior - OCT shows Coats Disease with diffuse atrophy -- stable improvement in peripheral IRF/IRHM and edema - FA 1.24.20 shows severe peripheral vascular nonperfusion and leakage at border of perfusion/nonperfusion  - s/p PRP OD (01.20.21) - FA 3.24.21 shows interval improvement in vascular leakage post PRP laser  - f/u 1 year, DFE, OCT, optos color/FAF  2-5. Proliferative retinopathy w/ retinal and iris neovascularization and Neovascular Glaucoma OD  - secondary to long standing retinal vasculitis / Coats disease - IOP 14 today -- on Combigan  per Dr. Mevelyn -- pt  states using drop about once a week  - s/p PRP OD as above  - exam shows stable regression of NVI and retinal NV  - discussed findings  - f/u in 1 year  6,7. Hypertensive retinopathy OU  - discussed importance of tight BP control  - monitor  8. Diabetes mellitus, type 2 without retinopathy  - A1C 6.6 (11.13.24) - The incidence, risk factors for progression, natural history and treatment options for diabetic retinopathy  were discussed with patient.   - The need for close monitoring of blood glucose, blood pressure, and serum lipids, avoiding cigarette or any type of tobacco, and the need for long term follow up was also discussed with patient.  - monitor  9. Mixed form age related cataract OU - The symptoms of cataract, surgical options, and treatments and risks were discussed with patient.  - discussed diagnosis and progression  - monitor for now  Ophthalmic Meds Ordered this visit:  No orders of the defined types were placed in this encounter.    Return in about 1 year (around 09/10/2025) for f/u Coats Dis., DFE, OCT, Optos color photos/FAF.  There are no Patient Instructions on file  for this visit.  Explained the diagnoses, plan, and follow up with the patient and they expressed understanding.  Patient expressed understanding of the importance of proper follow up care.   This document serves as a record of services personally performed by Redell JUDITHANN Hans, MD, PhD. It was created on their behalf by Paulina Jamse Gay an ophthalmic technician. The creation of this record is the provider's dictation and/or activities during the visit.   Electronically signed by: Alana D Fowler  09/16/24  9:53 PM   This document serves as a record of services personally performed by Redell JUDITHANN Hans, MD, PhD. It was created on their behalf by Wanda GEANNIE Keens, COT an ophthalmic technician. The creation of this record is the provider's dictation and/or activities during the visit.     Electronically signed by:  Wanda GEANNIE Keens, COT  09/16/24 9:53 PM  Redell JUDITHANN Hans, M.D., Ph.D. Diseases & Surgery of the Retina and Vitreous [facility]  I have reviewed the above documentation for accuracy and completeness, and I agree with the above. Redell JUDITHANN Hans, M.D., Ph.D. 09/16/24 9:55 PM   Abbreviations: M myopia (nearsighted); A astigmatism; H hyperopia (farsighted); P presbyopia; Mrx spectacle prescription;  CTL contact lenses; OD right eye; OS left eye; OU both eyes  XT exotropia; ET esotropia; PEK punctate epithelial keratitis; PEE punctate epithelial erosions; DES dry eye syndrome; MGD meibomian gland dysfunction; ATs artificial tears; PFAT's preservative free artificial tears; NSC nuclear sclerotic cataract; PSC posterior subcapsular cataract; ERM epi-retinal membrane; PVD posterior vitreous detachment; RD retinal detachment; DM diabetes mellitus; DR diabetic retinopathy; NPDR non-proliferative diabetic retinopathy; PDR proliferative diabetic retinopathy; CSME clinically significant macular edema; DME diabetic macular edema; dbh dot blot hemorrhages; CWS cotton wool spot; POAG primary open angle glaucoma; C/D cup-to-disc ratio; HVF humphrey visual field; GVF goldmann visual field; OCT optical coherence tomography; IOP intraocular pressure; BRVO Branch retinal vein occlusion; CRVO central retinal vein occlusion; CRAO central retinal artery occlusion; BRAO branch retinal artery occlusion; RT retinal tear; SB scleral buckle; PPV pars plana vitrectomy; VH Vitreous hemorrhage; PRP panretinal laser photocoagulation; IVK intravitreal kenalog; VMT vitreomacular traction; MH Macular hole;  NVD neovascularization of the disc; NVE neovascularization elsewhere; AREDS age related eye disease study; ARMD age related macular degeneration; POAG primary open angle glaucoma; EBMD epithelial/anterior basement membrane dystrophy; ACIOL anterior chamber intraocular lens; IOL  intraocular lens; PCIOL posterior chamber intraocular lens; Phaco/IOL phacoemulsification with intraocular lens placement; PRK photorefractive keratectomy; LASIK laser assisted in situ keratomileusis; HTN hypertension; DM diabetes mellitus; COPD chronic obstructive pulmonary disease "

## 2024-09-01 ENCOUNTER — Other Ambulatory Visit: Payer: Self-pay

## 2024-09-03 ENCOUNTER — Other Ambulatory Visit: Payer: Self-pay

## 2024-09-03 MED ORDER — DULAGLUTIDE 3 MG/0.5ML ~~LOC~~ SOAJ
3.0000 mg | SUBCUTANEOUS | 5 refills | Status: AC
  Filled 2024-09-03: qty 2, 28d supply, fill #0
  Filled 2024-10-03: qty 2, 28d supply, fill #1

## 2024-09-10 ENCOUNTER — Encounter (INDEPENDENT_AMBULATORY_CARE_PROVIDER_SITE_OTHER): Payer: Self-pay | Admitting: Ophthalmology

## 2024-09-10 ENCOUNTER — Ambulatory Visit (INDEPENDENT_AMBULATORY_CARE_PROVIDER_SITE_OTHER): Payer: 59 | Admitting: Ophthalmology

## 2024-09-10 DIAGNOSIS — H25813 Combined forms of age-related cataract, bilateral: Secondary | ICD-10-CM

## 2024-09-10 DIAGNOSIS — H211X1 Other vascular disorders of iris and ciliary body, right eye: Secondary | ICD-10-CM | POA: Diagnosis not present

## 2024-09-10 DIAGNOSIS — H4051X3 Glaucoma secondary to other eye disorders, right eye, severe stage: Secondary | ICD-10-CM | POA: Diagnosis not present

## 2024-09-10 DIAGNOSIS — E119 Type 2 diabetes mellitus without complications: Secondary | ICD-10-CM

## 2024-09-10 DIAGNOSIS — H35033 Hypertensive retinopathy, bilateral: Secondary | ICD-10-CM

## 2024-09-10 DIAGNOSIS — H35051 Retinal neovascularization, unspecified, right eye: Secondary | ICD-10-CM | POA: Diagnosis not present

## 2024-09-10 DIAGNOSIS — H3521 Other non-diabetic proliferative retinopathy, right eye: Secondary | ICD-10-CM

## 2024-09-10 DIAGNOSIS — I10 Essential (primary) hypertension: Secondary | ICD-10-CM | POA: Diagnosis not present

## 2024-09-10 DIAGNOSIS — H35021 Exudative retinopathy, right eye: Secondary | ICD-10-CM | POA: Diagnosis not present

## 2024-09-16 ENCOUNTER — Encounter (INDEPENDENT_AMBULATORY_CARE_PROVIDER_SITE_OTHER): Payer: Self-pay | Admitting: Ophthalmology

## 2024-10-03 ENCOUNTER — Other Ambulatory Visit: Payer: Self-pay

## 2024-10-25 ENCOUNTER — Ambulatory Visit: Admitting: Dermatology

## 2024-10-25 ENCOUNTER — Ambulatory Visit: Payer: 59 | Admitting: Dermatology

## 2025-09-10 ENCOUNTER — Encounter (INDEPENDENT_AMBULATORY_CARE_PROVIDER_SITE_OTHER): Admitting: Ophthalmology
